# Patient Record
Sex: Female | Born: 1935 | Race: White | Hispanic: No | Marital: Married | Smoking: Never smoker
Health system: Southern US, Community
[De-identification: ages and names within clinical notes are randomized; demographics above are authoritative.]

## PROBLEM LIST (undated history)

## (undated) DIAGNOSIS — I509 Heart failure, unspecified: Secondary | ICD-10-CM

## (undated) DIAGNOSIS — I10 Essential (primary) hypertension: Secondary | ICD-10-CM

## (undated) DIAGNOSIS — E039 Hypothyroidism, unspecified: Secondary | ICD-10-CM

## (undated) DIAGNOSIS — I890 Lymphedema, not elsewhere classified: Secondary | ICD-10-CM

## (undated) DIAGNOSIS — C801 Malignant (primary) neoplasm, unspecified: Secondary | ICD-10-CM

## (undated) DIAGNOSIS — M109 Gout, unspecified: Secondary | ICD-10-CM

## (undated) DIAGNOSIS — N189 Chronic kidney disease, unspecified: Secondary | ICD-10-CM

## (undated) DIAGNOSIS — Z974 Presence of external hearing-aid: Secondary | ICD-10-CM

## (undated) DIAGNOSIS — I6529 Occlusion and stenosis of unspecified carotid artery: Secondary | ICD-10-CM

## (undated) DIAGNOSIS — E785 Hyperlipidemia, unspecified: Secondary | ICD-10-CM

## (undated) HISTORY — DX: Malignant (primary) neoplasm, unspecified: C80.1

## (undated) HISTORY — DX: Gout, unspecified: M10.9

## (undated) HISTORY — DX: Hyperlipidemia, unspecified: E78.5

## (undated) HISTORY — PX: HERNIA REPAIR: SHX51

## (undated) HISTORY — DX: Heart failure, unspecified: I50.9

## (undated) HISTORY — DX: Chronic kidney disease, unspecified: N18.9

## (undated) HISTORY — DX: Occlusion and stenosis of unspecified carotid artery: I65.29

## (undated) HISTORY — DX: Essential (primary) hypertension: I10

## (undated) HISTORY — DX: Lymphedema, not elsewhere classified: I89.0

## (undated) HISTORY — PX: ABDOMINAL HYSTERECTOMY: SHX81

---
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
## 2005-11-21 ENCOUNTER — Other Ambulatory Visit: Payer: Self-pay

## 2005-11-21 ENCOUNTER — Emergency Department: Payer: Self-pay | Admitting: General Practice

## 2006-05-20 ENCOUNTER — Inpatient Hospital Stay: Payer: Self-pay | Admitting: General Practice

## 2006-05-20 HISTORY — PX: TOTAL KNEE ARTHROPLASTY: SHX125

## 2006-06-23 ENCOUNTER — Other Ambulatory Visit: Payer: Self-pay

## 2006-06-23 ENCOUNTER — Inpatient Hospital Stay: Payer: Self-pay | Admitting: Internal Medicine

## 2006-07-03 ENCOUNTER — Other Ambulatory Visit: Payer: Self-pay

## 2006-07-03 ENCOUNTER — Emergency Department: Payer: Self-pay | Admitting: General Practice

## 2006-12-13 DIAGNOSIS — I10 Essential (primary) hypertension: Secondary | ICD-10-CM | POA: Insufficient documentation

## 2008-03-19 ENCOUNTER — Ambulatory Visit: Payer: Self-pay | Admitting: General Practice

## 2008-05-09 ENCOUNTER — Ambulatory Visit: Payer: Self-pay | Admitting: Pain Medicine

## 2008-06-13 ENCOUNTER — Ambulatory Visit: Payer: Self-pay | Admitting: Pain Medicine

## 2008-07-04 ENCOUNTER — Ambulatory Visit: Payer: Self-pay | Admitting: Pain Medicine

## 2008-10-11 ENCOUNTER — Ambulatory Visit: Payer: Self-pay | Admitting: Cardiology

## 2008-10-11 ENCOUNTER — Ambulatory Visit: Payer: Self-pay | Admitting: General Practice

## 2008-10-12 ENCOUNTER — Inpatient Hospital Stay: Payer: Self-pay | Admitting: General Practice

## 2008-10-12 HISTORY — PX: TOTAL HIP ARTHROPLASTY: SHX124

## 2010-02-10 ENCOUNTER — Ambulatory Visit: Payer: Self-pay | Admitting: Family Medicine

## 2011-01-15 DIAGNOSIS — E039 Hypothyroidism, unspecified: Secondary | ICD-10-CM | POA: Insufficient documentation

## 2011-09-03 ENCOUNTER — Ambulatory Visit: Payer: Self-pay | Admitting: Unknown Physician Specialty

## 2011-09-03 LAB — BASIC METABOLIC PANEL
BUN: 21 mg/dL — ABNORMAL HIGH (ref 7–18)
Calcium, Total: 9.4 mg/dL (ref 8.5–10.1)
Creatinine: 0.98 mg/dL (ref 0.60–1.30)
EGFR (Non-African Amer.): 58 — ABNORMAL LOW
Glucose: 95 mg/dL (ref 65–99)
Osmolality: 280 (ref 275–301)
Potassium: 3.8 mmol/L (ref 3.5–5.1)

## 2011-09-03 LAB — CBC
HCT: 41.3 % (ref 35.0–47.0)
HGB: 13.5 g/dL (ref 12.0–16.0)
MCH: 30 pg (ref 26.0–34.0)
Platelet: 256 10*3/uL (ref 150–440)
RBC: 4.5 10*6/uL (ref 3.80–5.20)

## 2011-09-03 LAB — URINALYSIS, COMPLETE
Bilirubin,UR: NEGATIVE
Blood: NEGATIVE
Ketone: NEGATIVE
Leukocyte Esterase: NEGATIVE
Nitrite: NEGATIVE
Ph: 5 (ref 4.5–8.0)
RBC,UR: 1 /HPF (ref 0–5)

## 2011-09-10 ENCOUNTER — Inpatient Hospital Stay: Payer: Self-pay | Admitting: Unknown Physician Specialty

## 2011-09-10 HISTORY — PX: REPLACEMENT TOTAL KNEE: SUR1224

## 2011-09-11 LAB — HEMOGLOBIN: HGB: 10.9 g/dL — ABNORMAL LOW (ref 12.0–16.0)

## 2011-09-11 LAB — BASIC METABOLIC PANEL
BUN: 22 mg/dL — ABNORMAL HIGH (ref 7–18)
Chloride: 106 mmol/L (ref 98–107)
Creatinine: 1.17 mg/dL (ref 0.60–1.30)
Potassium: 4.2 mmol/L (ref 3.5–5.1)
Sodium: 141 mmol/L (ref 136–145)

## 2011-09-11 LAB — PLATELET COUNT: Platelet: 208 10*3/uL (ref 150–440)

## 2011-09-12 LAB — HEMOGLOBIN: HGB: 10.8 g/dL — ABNORMAL LOW (ref 12.0–16.0)

## 2012-04-13 ENCOUNTER — Ambulatory Visit: Payer: Self-pay | Admitting: Orthopedic Surgery

## 2012-04-13 LAB — BASIC METABOLIC PANEL
Anion Gap: 7 (ref 7–16)
BUN: 24 mg/dL — ABNORMAL HIGH (ref 7–18)
Calcium, Total: 9.4 mg/dL (ref 8.5–10.1)
Chloride: 108 mmol/L — ABNORMAL HIGH (ref 98–107)
Creatinine: 0.98 mg/dL (ref 0.60–1.30)
EGFR (African American): >60
Osmolality: 289 (ref 275–301)
Potassium: 4.2 mmol/L (ref 3.5–5.1)

## 2012-04-13 LAB — CBC WITH DIFFERENTIAL/PLATELET
Eosinophil %: 3.4 %
HCT: 40.8 % (ref 35.0–47.0)
Lymphocyte %: 24.8 %
MCV: 92 fL (ref 80–100)
Monocyte #: 0.6 x10 3/mm (ref 0.2–0.9)
Monocyte %: 8.5 %
Neutrophil #: 4.5 10*3/uL (ref 1.4–6.5)
RBC: 4.42 10*6/uL (ref 3.80–5.20)
WBC: 7.2 10*3/uL (ref 3.6–11.0)

## 2012-04-21 ENCOUNTER — Ambulatory Visit: Payer: Self-pay | Admitting: Orthopedic Surgery

## 2012-04-21 HISTORY — PX: HARDWARE REMOVAL: SHX979

## 2013-01-22 HISTORY — PX: OTHER SURGICAL HISTORY: SHX169

## 2013-05-31 ENCOUNTER — Ambulatory Visit: Payer: Self-pay | Admitting: Family Medicine

## 2013-05-31 DIAGNOSIS — K760 Fatty (change of) liver, not elsewhere classified: Secondary | ICD-10-CM | POA: Insufficient documentation

## 2013-05-31 HISTORY — PX: OTHER SURGICAL HISTORY: SHX169

## 2013-06-15 ENCOUNTER — Ambulatory Visit: Payer: Self-pay | Admitting: Urgent Care

## 2013-08-05 ENCOUNTER — Emergency Department: Payer: Self-pay | Admitting: Emergency Medicine

## 2013-08-05 LAB — COMPREHENSIVE METABOLIC PANEL
Albumin: 3.5 g/dL (ref 3.4–5.0)
Alkaline Phosphatase: 82 U/L
BUN: 19 mg/dL — ABNORMAL HIGH (ref 7–18)
Bilirubin,Total: 0.4 mg/dL (ref 0.2–1.0)
Calcium, Total: 9.5 mg/dL (ref 8.5–10.1)
Chloride: 104 mmol/L (ref 98–107)
Co2: 26 mmol/L (ref 21–32)
Creatinine: 1.1 mg/dL (ref 0.60–1.30)
Glucose: 125 mg/dL — ABNORMAL HIGH (ref 65–99)
Osmolality: 278 (ref 275–301)
Potassium: 3.8 mmol/L (ref 3.5–5.1)
SGOT(AST): 10 U/L — ABNORMAL LOW (ref 15–37)
SGPT (ALT): 16 U/L (ref 12–78)
Sodium: 137 mmol/L (ref 136–145)

## 2013-08-05 LAB — URINALYSIS, COMPLETE
Bilirubin,UR: NEGATIVE
Blood: NEGATIVE
Nitrite: NEGATIVE
Ph: 5 (ref 4.5–8.0)
Protein: NEGATIVE
RBC,UR: 12 /HPF (ref 0–5)
Specific Gravity: 1.019 (ref 1.003–1.030)
Squamous Epithelial: 28

## 2013-08-05 LAB — CBC WITH DIFFERENTIAL/PLATELET
Basophil #: 0.1 10*3/uL (ref 0.0–0.1)
Basophil %: 1 %
Eosinophil #: 0.2 10*3/uL (ref 0.0–0.7)
Eosinophil %: 1.6 %
HCT: 43.2 % (ref 35.0–47.0)
HGB: 13.7 g/dL (ref 12.0–16.0)
Lymphocyte #: 1.8 10*3/uL (ref 1.0–3.6)
Lymphocyte %: 19.2 %
MCH: 28.7 pg (ref 26.0–34.0)
MCHC: 31.6 g/dL — ABNORMAL LOW (ref 32.0–36.0)
MCV: 91 fL (ref 80–100)
Monocyte #: 0.9 x10 3/mm (ref 0.2–0.9)
Monocyte %: 10 %
Neutrophil %: 68.2 %
RBC: 4.77 10*6/uL (ref 3.80–5.20)
RDW: 14.5 % (ref 11.5–14.5)
WBC: 9.5 10*3/uL (ref 3.6–11.0)

## 2013-08-05 LAB — LIPASE, BLOOD: Lipase: 119 U/L (ref 73–393)

## 2013-08-08 ENCOUNTER — Ambulatory Visit: Payer: Self-pay | Admitting: Gastroenterology

## 2013-08-14 LAB — PATHOLOGY REPORT

## 2014-03-26 LAB — CBC AND DIFFERENTIAL
HCT: 41 % (ref 36–46)
Hemoglobin: 13.6 g/dL (ref 12.0–16.0)
Platelets: 368 10*3/uL (ref 150–399)
WBC: 8.6 10*3/mL

## 2014-08-22 LAB — TSH: TSH: 5.39 u[IU]/mL (ref 0.41–5.90)

## 2014-08-22 LAB — HEPATIC FUNCTION PANEL
ALT: 14 U/L (ref 7–35)
AST: 20 U/L (ref 13–35)

## 2014-08-22 LAB — BASIC METABOLIC PANEL
BUN: 25 mg/dL — AB (ref 4–21)
CREATININE: 1 mg/dL (ref 0.5–1.1)
Glucose: 110 mg/dL
POTASSIUM: 4.3 mmol/L (ref 3.4–5.3)
Sodium: 141 mmol/L (ref 137–147)

## 2014-08-22 LAB — LIPID PANEL
CHOLESTEROL: 249 mg/dL — AB (ref 0–200)
HDL: 47 mg/dL (ref 35–70)
LDL Cholesterol: 155 mg/dL
Triglycerides: 235 mg/dL — AB (ref 40–160)

## 2014-08-22 LAB — HEMOGLOBIN A1C: Hgb A1c MFr Bld: 6 % (ref 4.0–6.0)

## 2014-10-04 DIAGNOSIS — J069 Acute upper respiratory infection, unspecified: Secondary | ICD-10-CM | POA: Diagnosis not present

## 2014-10-04 DIAGNOSIS — E039 Hypothyroidism, unspecified: Secondary | ICD-10-CM | POA: Diagnosis not present

## 2014-12-11 NOTE — Op Note (Signed)
PATIENT NAME:  Kayla Chan, Kayla Chan MR#:  [id number] DATE OF BIRTH:  1931-11-13  DATE OF PROCEDURE:  04/21/2012  PREOPERATIVE DIAGNOSES: Painful hardware right medial knee.   POSTOPERATIVE DIAGNOSIS:  Painful hardware right medial knee.  PROCEDURE: Removal of deep hardware right medial knee.   SURGEON: Laurene Footman, M.D.   ANESTHESIA: General.   DESCRIPTION OF PROCEDURE:  The patient was brought to the operating room and after adequate general anesthesia was obtained, a tourniquet was applied to the upper thigh and the leg prepped and draped in the usual sterile fashion. Appropriate patient identification and time-out procedures were carried out. A medial skin incision was made overlying the medial femoral condyle and subcutaneous tissues spread. The heads of both screws that needed to be removed could be palpated, but because of overlying soft tissue they could not be backed out without a slightly larger incision. Approximately a 5-cm incision was made and the subcutaneous tissue spread. The screws were palpable through the hamstrings. The soft tissue was split and the heads of the screws were identified and then backed out without much difficulty. C-arm was used to help getting alignment and finding the heads of the screws. The washers stayed behind underlying the tissue and were removed separately without difficulty. After this had been completed, the wound was thoroughly irrigated. The wound was closed with 2-0 Vicryl and 4-0 nylon in simple interrupted fashion. Dressing of Xeroform, 4 x 4's, and tape was applied. The patient was sent to the recovery room in stable condition. Hardware was discarded.   ESTIMATED BLOOD LOSS: 25 mL.   COMPLICATIONS: None.   ____________________________ Laurene Footman, MD mjm:bjt D: 04/21/2012 19:10:16 ET T: 04/22/2012 08:07:35 ET JOB#: [id number] Laurene Footman MD ELECTRONICALLY SIGNED 04/22/2012 8:39

## 2014-12-16 NOTE — Discharge Summary (Signed)
PATIENT NAME:  Kayla Chan, Kayla Chan MR#:  [id number] DATE OF BIRTH:  12-21-1931  DATE OF ADMISSION:  09/10/2011 DATE OF DISCHARGE:  09/14/2011  ADMITTING DIAGNOSIS: Right knee degenerative arthritis.   DISCHARGE DIAGNOSIS: Right knee degenerative arthritis.   OPERATION: On 09/10/2011 the patient underwent cemented right total knee replacement using computer-aided navigation.   SURGEON: Albesa Seen, M.D.   ASSISTANT: Vance Peper, PA-C   ANESTHESIA: Spinal.   ESTIMATED BLOOD LOSS: 250 mL with replacement of 2 liters of crystalloid.   DRAINS:  One Autovac.   COMPLICATIONS: Fracture of the medial femoral condyle treated with two screws.   IMPLANTS USED: Stryker Triathlon peg,  #3 posterior stabilized femoral component, #4 tibial tray, 11-mm PS insert, A29 patella, antibiotic-impregnated cement, 4.5 cortical and 4.5 cannulated screws.  The patient was stabilized, brought to the recovery room, and then brought down to the orthopedic floor.   HISTORY: The patient is a 81 year old female who presented for persistent right knee pain. The patient seems to have gotten worse with activity and with rest. The patient is having difficulty with activities of daily living. The patient does have a history of a pulmonary embolus. The patient failed conservative management with cortisone injections and activity modification with x-rays revealing tricompartmental arthritis.   PHYSICAL EXAMINATION:  GENERAL: Alert female with some difficulty with getting up and down with limping. CHEST: Clear. CARDIAC: Normal. MUSCULOSKELETAL: In regard to the right lower extremity, the patient has benign hip exam with knee exam showing tricompartmental tenderness with range of motion of 5 degrees from extension to 125 degrees flexion with good stability.   HOSPITAL COURSE: After initial admission on 09/10/2011 the patient was brought to the orthopedic floor. On postoperative day one the patient had a hemoglobin of 10.9 and  received Autovac transfusion with hemoglobin 10.8 the following day. The patient worked with physical therapy, initially bed to chair, and progressed up to ambulating over 140 feet including stairs on the day before discharge. The patient was sent home with Home Health physical therapy.   CONDITION AT DISCHARGE: Stable.   DISPOSITION: The patient was sent home with Home Health physical therapy.   DISCHARGE INSTRUCTIONS:  1. The patient is to follow up with [facility] orthopedics in about two weeks for staple removal.  2. The patient will do weight bear as tolerated using a walker initially. The patient will use thigh-high TED hose on both legs, removed at nighttime, and diet is regular.  3. The patient will use Polar Care to decrease swelling and to maintain a temperature of 40 degrees. The patient will try not to get her dressing wet or dirty and will call the clinic if any water gets under the bandage. The patient will call the clinic if there is any bright red bleeding, calf pain, bowel or bladder difficulty, or any fever greater than 101.5.   DISCHARGE MEDICATIONS:  Resume home medications. 1. Xarelto 10 mg once a day for 10 days. 2. Celebrex 200 mg twice a day. 3. Tylenol 650/1000 mg every six hours as needed for pain.  4. Ultram 1 to 2 tablets every six hours as needed for pain. 5. Oxycodone 5 mg 1 to 2 tablets every four hours as needed for severe pain.   ____________________________ Lenna Sciara. Reche Dixon, [place] [email or web address]:bjt D:  09/14/2011 07:06:01 ET         T: 09/15/2011 11:01:50 ET       JOB#: [id number]  cc: J. Reche Dixon, PA, <Dictator> J Macon Sandiford [facility] PA ELECTRONICALLY SIGNED  09/16/2011 7:23 

## 2014-12-16 NOTE — Op Note (Signed)
PATIENT NAME:  Kayla Chan, Kayla Chan MR#:  [id number] DATE OF BIRTH:  [birth year]  DATE OF PROCEDURE:  09/10/2011  PREOPERATIVE DIAGNOSIS: Degenerative arthritis, right knee.   POSTOPERATIVE DIAGNOSIS: Degenerative arthritis, right knee.   PROCEDURES:  1. Cemented right total knee replacement.  2. Computer-assisted navigation for right total knee replacement.   SURGEON: Alysia Penna. Mauri Pole, MD  ASSISTANT: Vance Peper, PA-C.   ANESTHESIA: Spinal.   ESTIMATED BLOOD LOSS: 250 mL.   REPLACEMENT: 2 liters of crystalloid.   DRAINS: One Autovac.   COMPLICATIONS: Fracture of medial femoral condyle treated with two screws.   IMPLANTS USED: Stryker Triathlon pegged #3 posterior stabilized femoral component, #4 tibial tray, 11 mm PS insert, A29 patella, antibiotic-impregnated cement, 4.5 cortical and 4.5 cannulated screws.   BRIEF CLINICAL NOTE AND PATHOLOGY: The patient had persistent knee pain with documented degenerative arthritis. Options, risks, and benefits were discussed in detail. Patient elected to proceed with operative intervention. She had failed physical therapy as she had done on her other knee, also had had injections and tried anti-inflammatory medication. She had pain at rest, worse with activity. At the time of the procedure there was severe tricompartmental arthritis. There was excellent range of motion at the end of the procedure.   The fracture through the medial femoral condyle occurred while the trial femoral component was being impacted. This was repaired anatomically with two screws.   DESCRIPTION OF PROCEDURE: Preop antibiotics, adequate anesthesia, routine prepping and draping. Appropriate timeout was called. The leg was elevated, Esmarch bandage was used and the tourniquet was inflated to 250 mmHg. Total tourniquet time was 45 minutes.   Routine midline incision was made. Medial parapatellar incision was made, patella was slid to the lateral aspect. The knee was debrided of soft  tissue. There was significant synovitis in the suprapatellar pouch.   Trackers were then placed and mapping of the knee was performed.   Distal femur was then resected using navigation. Chamfer cuts were made followed by preparation of intercondylar notch area.   Posterior aspect of the distal femur was stripped of soft tissue.   Proximal tibia was then resected using navigation checking with external alignment rod. The knee taken through a range of motion and the ligaments were balanced.   The femoral trial was then placed at which point fracture of the medial femoral condyle from the intercondylar notch area. This was reduced with a clamp and one 4.5 mm cortical screw was placed from medial to lateral followed by a 4.5 cancellus screw. Compression was excellent.   The patella thickness was measured, 10 mm were removed leaving 12 mm and drill holes were made.   The proximal tibia was then prepared in routine fashion using navigation, again checking with external alignment rod.   The knee was then thoroughly irrigated with pulsating lavage. The area where the fracture extended into the intercondylar notch area was covered with a thin layer of cement. This was allowed to harden. The remainder of the cement was then mixed and the components were inserted in routine fashion beginning with the tibial component followed by the femoral component. They were impacted, alignment was checked, trial was placed. The knee was maintained in extension while excess cement was removed and cement was allowed to harden. The patella component was placed and maintained with a clamp with excess cement being removed.   Different spacers were tried, it was felt that the 11 mm gave the best range of motion with stability. The actual 11 mm was  then placed after posterior capsule injected Marcaine with epinephrine and morphine.   Knee taken through range of motion, it was measured to be from 0 to 135 degrees. The knee was  stable.   The tourniquet was released. Hemostasis was good. The Autovac drain was placed. The fascia closed with #2 quill the knee in slight flexion. Subcutaneous closed with 0 quill the knee in slight flexion followed by placement of staples with knee in more flexion. Soft sterile dressing was applied. Sponge and needle counts reported as correct prior to and after wound closure. The patient was awakened, taken to the PAC-U having tolerated the procedure well.    ____________________________ Alysia Penna. Mauri Pole, MD jcc:cms D: 09/11/2011 15:01:00 ET T: 09/11/2011 16:06:23 ET JOB#: [id number]  cc: Alysia Penna. Mauri Pole, MD, <Dictator> Alysia Penna Gryffin Altice MD ELECTRONICALLY SIGNED 09/18/2011 12:27

## 2014-12-18 ENCOUNTER — Ambulatory Visit
Admit: 2014-12-18 | Disposition: A | Discharge status: Home / Self Care | Payer: Self-pay | Attending: Family Medicine | Admitting: Family Medicine

## 2014-12-18 DIAGNOSIS — Z1231 Encounter for screening mammogram for malignant neoplasm of breast: Secondary | ICD-10-CM | POA: Diagnosis not present

## 2015-01-25 ENCOUNTER — Other Ambulatory Visit: Payer: Self-pay | Admitting: *Deleted

## 2015-01-25 NOTE — Telephone Encounter (Signed)
Refill request for desonide 0.05% Cream Last filled by MD on- 09/05/2014  Last Appt: 10/04/2014 Next Appt: none Please advise refill? Desonide cream was in pt's fill history, but not in med list.

## 2015-01-26 MED ORDER — DESONIDE 0.05 % EX CREA: 1.0000 "application " | TOPICAL_CREAM | Freq: Every day | CUTANEOUS | Status: DC

## 2015-02-18 DIAGNOSIS — Z85828 Personal history of other malignant neoplasm of skin: Secondary | ICD-10-CM | POA: Insufficient documentation

## 2015-04-01 ENCOUNTER — Other Ambulatory Visit: Payer: Self-pay | Admitting: Family Medicine

## 2015-05-17 ENCOUNTER — Other Ambulatory Visit: Payer: Self-pay | Admitting: Family Medicine

## 2015-05-31 ENCOUNTER — Ambulatory Visit (INDEPENDENT_AMBULATORY_CARE_PROVIDER_SITE_OTHER): Payer: Medicare Other | Admitting: Family Medicine

## 2015-05-31 DIAGNOSIS — Z23 Encounter for immunization: Secondary | ICD-10-CM

## 2015-06-28 DIAGNOSIS — L304 Erythema intertrigo: Secondary | ICD-10-CM | POA: Insufficient documentation

## 2015-06-28 DIAGNOSIS — M549 Dorsalgia, unspecified: Secondary | ICD-10-CM | POA: Insufficient documentation

## 2015-06-28 DIAGNOSIS — I491 Atrial premature depolarization: Secondary | ICD-10-CM | POA: Insufficient documentation

## 2015-06-28 DIAGNOSIS — R609 Edema, unspecified: Secondary | ICD-10-CM | POA: Insufficient documentation

## 2015-06-28 DIAGNOSIS — R7303 Prediabetes: Secondary | ICD-10-CM | POA: Insufficient documentation

## 2015-06-28 DIAGNOSIS — L309 Dermatitis, unspecified: Secondary | ICD-10-CM | POA: Insufficient documentation

## 2015-06-28 DIAGNOSIS — K5900 Constipation, unspecified: Secondary | ICD-10-CM | POA: Insufficient documentation

## 2015-06-28 DIAGNOSIS — I4940 Unspecified premature depolarization: Secondary | ICD-10-CM | POA: Insufficient documentation

## 2015-07-01 ENCOUNTER — Ambulatory Visit (INDEPENDENT_AMBULATORY_CARE_PROVIDER_SITE_OTHER): Payer: Medicare Other | Admitting: Family Medicine

## 2015-07-01 ENCOUNTER — Encounter: Payer: Self-pay | Admitting: Family Medicine

## 2015-07-01 VITALS — BP 142/72 | HR 71 | Temp 97.6°F | Resp 16 | Wt 243.0 lb

## 2015-07-01 DIAGNOSIS — E039 Hypothyroidism, unspecified: Secondary | ICD-10-CM

## 2015-07-01 DIAGNOSIS — R7303 Prediabetes: Secondary | ICD-10-CM | POA: Diagnosis not present

## 2015-07-01 DIAGNOSIS — E785 Hyperlipidemia, unspecified: Secondary | ICD-10-CM

## 2015-07-01 DIAGNOSIS — K760 Fatty (change of) liver, not elsewhere classified: Secondary | ICD-10-CM

## 2015-07-01 DIAGNOSIS — I4940 Unspecified premature depolarization: Secondary | ICD-10-CM

## 2015-07-01 DIAGNOSIS — M1990 Unspecified osteoarthritis, unspecified site: Secondary | ICD-10-CM | POA: Diagnosis not present

## 2015-07-01 DIAGNOSIS — R300 Dysuria: Secondary | ICD-10-CM

## 2015-07-01 LAB — POCT URINALYSIS DIPSTICK
Bilirubin, UA: NEGATIVE
Blood, UA: NEGATIVE
GLUCOSE UA: NEGATIVE
Ketones, UA: NEGATIVE
Leukocytes, UA: NEGATIVE
NITRITE UA: NEGATIVE
Protein, UA: NEGATIVE
Spec Grav, UA: >=1.03
UROBILINOGEN UA: 0.2
pH, UA: 6

## 2015-07-01 MED ORDER — MOMETASONE FUROATE 0.1 % EX OINT: 1.0000 "application " | TOPICAL_OINTMENT | Freq: Every day | CUTANEOUS | Status: DC | PRN

## 2015-07-01 NOTE — Progress Notes (Signed)
Patient: Kayla Chan Female    DOB: 1931/09/15   81 y.o.   MRN: [id number] Visit Date: 07/01/2015  Today's Provider: Lelon Huh, MD   Chief Complaint  Patient presents with  . Hypertension    follow up  . Hypothyroidism    follow up  . Hyperglycemia    follow up  . Hyperlipidemia    follow up  . Dysuria   Subjective:    HPI  Hypertension, follow-up:  BP Readings from Last 3 Encounters:  07/01/15 142/72  10/04/14 120/84    She was last seen for hypertension 11 months ago.  BP at that visit was 144/82. Management since that visit includes no changes. She reports good compliance with treatment. She is having side effects. Has double vision at night after taking Verapamil. She is not exercising. She is adherent to low salt diet.   Outside blood pressures are 130/67. She is experiencing occasional palpitations Patient denies chest pain, chest pressure/discomfort, claudication, dyspnea, exertional chest pressure/discomfort, fatigue, irregular heart beat, lower extremity edema, near-syncope, orthopnea, paroxysmal nocturnal dyspnea, syncope and tachypnea.   Cardiovascular risk factors include advanced age (older than 61 for men, 87 for women), hypertension, obesity (BMI >= 30 kg/m2) and sedentary lifestyle.  Use of agents associated with hypertension: NSAIDS.     Weight trend: stable Wt Readings from Last 3 Encounters:  07/01/15 243 lb (110.224 kg)  10/04/14 241 lb (109.317 kg)    Current diet: in general, a "healthy" diet    ------------------------------------------------------------------------   Hyperglycemia, Follow-up:   Lab Results  Component Value Date   HGBA1C 6.0 08/22/2014   GLUCOSE 125* 08/05/2013   GLUCOSE 100* 04/13/2012   GLUCOSE 130* 09/11/2011    Last seen for for this 11 months ago.  Management since then includes no changes. Current symptoms include visual disturbances and have been stable.  Weight trend: stable Prior visit  with dietician: no Current diet: in general, a "healthy" diet   Current exercise: none  Pertinent Labs:    Component Value Date/Time   CHOL 249* 08/22/2014   TRIG 235* 08/22/2014   CREATININE 1.0 08/22/2014   CREATININE 1.10 08/05/2013 1238    Wt Readings from Last 3 Encounters:  07/01/15 243 lb (110.224 kg)  10/04/14 241 lb (109.317 kg)    Lipid/Cholesterol, Follow-up:   Last seen for this11 months ago.  Management changes since that visit include no changes. . Last Lipid Panel:    Component Value Date/Time   CHOL 249* 08/22/2014   TRIG 235* 08/22/2014   HDL 47 08/22/2014   LDLCALC 155 08/22/2014    Risk factors for vascular disease include hypertension  She reports good compliance with treatment. She is not having side effects.  Current symptoms include visual disturbances and have been stable. Weight trend: stable Prior visit with dietician: no Current diet: in general, a "healthy" diet   Current exercise: none  Wt Readings from Last 3 Encounters:  07/01/15 243 lb (110.224 kg)  10/04/14 241 lb (109.317 kg)    ------------------------------------------------------------------- Hypothyroidism follow up:  Last office visit was 08/21/2014. Changes made at that visit includes increasing Levothyroxine  To 172mcg daily. Patient reports good compliance with treatment, and good tolerance.  Lab Results  Component Value Date   TSH 5.39 08/22/2014       Dysuria: Patient comes in requesting her urine be checked for infection. Patient reports last week she has slight burning after urinating and low back pain, but has now resolved.  Allergies  Allergen Reactions  . Atorvastatin     Other reaction(s): Muscle Pain  . Colestid  [Colestipol Hcl]     Itching, insomnia  . Crestor  [Rosuvastatin Calcium]     Other reaction(s): Muscle Pain  . Doxycycline   . Fluvastatin Sodium     Other reaction(s): Muscle Pain  . Furosemide     Severe Cramping  . Penicillins     . Pravastatin Sodium     Numbness in legs   Previous Medications   ASPIRIN 81 MG TABLET    Take 81 mg by mouth daily.   BENAZEPRIL-HYDROCHLORTHIAZIDE (LOTENSIN HCT) 10-12.5 MG TABLET    Take 0.5 tablets by mouth daily.   COENZYME Q-10 100 MG CAPSULE    Take 1 capsule by mouth daily.   DESONIDE (DESOWEN) 0.05 % CREAM    Apply 1 application topically daily. Apply To Facial Rash As Needed   EZETIMIBE (ZETIA) 10 MG TABLET    Take 1 tablet by mouth every morning.   LEVOTHYROXINE (SYNTHROID, LEVOTHROID) 75 MCG TABLET    TAKE 1 TABLET BY MOUTH EVERY DAY   MULTIPLE VITAMINS-MINERALS (MULTIVITAMIN ADULT PO)    Take 1 tablet by mouth daily.   SENNA (SENOKOT) 8.6 MG TABLET    Take 1 tablet by mouth daily.   VERAPAMIL (VERELAN PM) 120 MG 24 HR CAPSULE    TAKE 1 CAPSULE EVERY EVENING    Review of Systems  Constitutional: Negative for fever, chills, appetite change and fatigue.  HENT: Positive for tinnitus.   Respiratory: Negative for chest tightness and shortness of breath.   Cardiovascular: Positive for palpitations. Negative for chest pain.  Gastrointestinal: Positive for nausea. Negative for vomiting and abdominal pain.  Genitourinary: Positive for dysuria (burning after urination).  Musculoskeletal: Positive for back pain.  Neurological: Negative for dizziness and weakness.    Social History  Substance Use Topics  . Smoking status: Never Smoker   . Smokeless tobacco: Not on file  . Alcohol Use: No   Objective:   BP 142/72 mmHg  Pulse 71  Temp(Src) 97.6 F (36.4 C) (Oral)  Resp 16  Wt 243 lb (110.224 kg)  SpO2 97%  Physical Exam  General Appearance:    Alert, cooperative, no distress, obese  Eyes:    PERRL, conjunctiva/corneas clear, EOM's intact       Lungs:     Clear to auscultation bilaterally, respirations unlabored  Heart:    Regular rate and rhythm  Neurologic:   Awake, alert, oriented x 3. No apparent focal neurological           defect.         Assessment & Plan:      1. Dysuria Resolved. Normal u/a - POCT Urinalysis Dipstick  2. Osteoarthritis, unspecified osteoarthritis type, unspecified site Stable on current medications.   3. Fatty infiltration of liver  - Comprehensive metabolic panel  4. HLD (hyperlipidemia) Tolerating Zetia.  - Comprehensive metabolic panel - Lipid panel  5. Hypothyroidism, unspecified hypothyroidism type  - T4 AND TSH  6. Pre-diabetes  - Hemoglobin A1c  7. Premature heartbeats Well controlled.        Lelon Huh, MD  [facility]

## 2015-07-02 DIAGNOSIS — R7303 Prediabetes: Secondary | ICD-10-CM | POA: Diagnosis not present

## 2015-07-02 DIAGNOSIS — E785 Hyperlipidemia, unspecified: Secondary | ICD-10-CM | POA: Diagnosis not present

## 2015-07-02 DIAGNOSIS — K760 Fatty (change of) liver, not elsewhere classified: Secondary | ICD-10-CM | POA: Diagnosis not present

## 2015-07-02 DIAGNOSIS — E039 Hypothyroidism, unspecified: Secondary | ICD-10-CM | POA: Diagnosis not present

## 2015-07-03 LAB — HEMOGLOBIN A1C
ESTIMATED AVERAGE GLUCOSE: 131 mg/dL
Hgb A1c MFr Bld: 6.2 % — ABNORMAL HIGH (ref 4.8–5.6)

## 2015-07-03 LAB — LIPID PANEL
CHOLESTEROL TOTAL: 235 mg/dL — AB (ref 100–199)
Chol/HDL Ratio: 5.2 ratio units — ABNORMAL HIGH (ref 0.0–4.4)
HDL: 45 mg/dL (ref >39)
LDL Calculated: 136 mg/dL — ABNORMAL HIGH (ref 0–99)
TRIGLYCERIDES: 271 mg/dL — AB (ref 0–149)
VLDL CHOLESTEROL CAL: 54 mg/dL — AB (ref 5–40)

## 2015-07-03 LAB — COMPREHENSIVE METABOLIC PANEL
ALBUMIN: 3.8 g/dL (ref 3.5–4.7)
ALK PHOS: 68 IU/L (ref 39–117)
ALT: 10 IU/L (ref 0–32)
AST: 18 IU/L (ref 0–40)
Albumin/Globulin Ratio: 1.5 (ref 1.1–2.5)
BILIRUBIN TOTAL: 0.3 mg/dL (ref 0.0–1.2)
BUN / CREAT RATIO: 23 (ref 11–26)
BUN: 22 mg/dL (ref 8–27)
CHLORIDE: 102 mmol/L (ref 97–106)
CO2: 20 mmol/L (ref 18–29)
Calcium: 9.3 mg/dL (ref 8.7–10.3)
Creatinine, Ser: 0.95 mg/dL (ref 0.57–1.00)
GFR calc Af Amer: 65 mL/min/{1.73_m2} (ref >59)
GFR calc non Af Amer: 56 mL/min/{1.73_m2} — ABNORMAL LOW (ref >59)
GLOBULIN, TOTAL: 2.6 g/dL (ref 1.5–4.5)
Glucose: 105 mg/dL — ABNORMAL HIGH (ref 65–99)
Potassium: 4.3 mmol/L (ref 3.5–5.2)
SODIUM: 141 mmol/L (ref 136–144)
Total Protein: 6.4 g/dL (ref 6.0–8.5)

## 2015-07-03 LAB — T4 AND TSH
T4 TOTAL: 11.2 ug/dL (ref 4.5–12.0)
TSH: 2.53 u[IU]/mL (ref 0.450–4.500)

## 2015-07-15 ENCOUNTER — Telehealth: Payer: Self-pay | Admitting: Family Medicine

## 2015-07-15 MED ORDER — TRAMADOL HCL 50 MG PO TABS: ORAL_TABLET | ORAL | Status: DC

## 2015-07-15 NOTE — Telephone Encounter (Signed)
Rx called into pharmacy. Patient notified.

## 2015-07-15 NOTE — Telephone Encounter (Signed)
Pt called called saying she has been having left foot pain. She went to see Dr. Alexandria Lodge at [facility] ortho and he took an xray saying that she has a spur .  He did not give her any medication.  She said he told her that her primary doctor would do that.  She said she needs something for pain.  [facility] [address].  Call back is [phone]  Thanks Con Memos

## 2015-07-15 NOTE — Telephone Encounter (Signed)
Please call in tramadol 50mg  one-two  tablets every eight hours as need for foot pain, #60, rf x 1

## 2015-07-17 ENCOUNTER — Encounter: Payer: Self-pay | Admitting: Family Medicine

## 2015-07-17 ENCOUNTER — Ambulatory Visit (INDEPENDENT_AMBULATORY_CARE_PROVIDER_SITE_OTHER): Payer: Medicare Other | Admitting: Family Medicine

## 2015-07-17 VITALS — BP 120/68 | HR 68 | Temp 98.5°F | Resp 16 | Wt 240.0 lb

## 2015-07-17 DIAGNOSIS — M1000 Idiopathic gout, unspecified site: Secondary | ICD-10-CM

## 2015-07-17 DIAGNOSIS — J069 Acute upper respiratory infection, unspecified: Secondary | ICD-10-CM | POA: Diagnosis not present

## 2015-07-17 MED ORDER — DICLOFENAC POTASSIUM 50 MG PO TABS: 50.0000 mg | ORAL_TABLET | Freq: Two times a day (BID) | ORAL | Status: DC | PRN

## 2015-07-17 NOTE — Progress Notes (Signed)
Patient: Kayla Chan Female    DOB: 06/11/1932   81 y.o.   MRN: [id number] Visit Date: 07/17/2015  Today's Provider: Lelon Huh, MD   Chief Complaint  Patient presents with  . Foot Swelling  . Leg Swelling   Subjective:    Toe Pain  The incident occurred 3 to 5 days ago (Saturday 07/13/2015). There was no injury mechanism. The pain is present in the left foot, left toes, left heel, left ankle and left leg. The pain is moderate. The pain has been constant since onset. Associated symptoms include an inability to bear weight and a loss of motion. Pertinent negatives include no numbness or tingling. The symptoms are aggravated by movement, weight bearing and palpation. She has tried nothing for the symptoms.   It started in her left great toe which is still painful, then spread to top of foot and ankle.    Allergies  Allergen Reactions  . Atorvastatin     Other reaction(s): Muscle Pain  . Colestid  [Colestipol Hcl]     Itching, insomnia  . Crestor  [Rosuvastatin Calcium]     Other reaction(s): Muscle Pain  . Doxycycline   . Fluvastatin Sodium     Other reaction(s): Muscle Pain  . Furosemide     Severe Cramping  . Penicillins   . Pravastatin Sodium     Numbness in legs   Previous Medications   ASPIRIN 81 MG TABLET    Take 81 mg by mouth daily.   BENAZEPRIL-HYDROCHLORTHIAZIDE (LOTENSIN HCT) 10-12.5 MG TABLET    Take 0.5 tablets by mouth daily.   COENZYME Q-10 100 MG CAPSULE    Take 1 capsule by mouth daily.   DESONIDE (DESOWEN) 0.05 % CREAM    Apply 1 application topically daily. Apply To Facial Rash As Needed   EZETIMIBE (ZETIA) 10 MG TABLET    Take 1 tablet by mouth every morning.   LEVOTHYROXINE (SYNTHROID, LEVOTHROID) 75 MCG TABLET    TAKE 1 TABLET BY MOUTH EVERY DAY   MOMETASONE (ELOCON) 0.1 % OINTMENT    Apply 1 application topically daily as needed. Apply to affected area   MULTIPLE VITAMINS-MINERALS (MULTIVITAMIN ADULT PO)    Take 1 tablet by mouth daily.     SENNA (SENOKOT) 8.6 MG TABLET    Take 1 tablet by mouth daily.   TRAMADOL (ULTRAM) 50 MG TABLET    Take 1-2 tablets every eight hours as need for foot pain   VERAPAMIL (VERELAN PM) 120 MG 24 HR CAPSULE    TAKE 1 CAPSULE EVERY EVENING    Review of Systems  Respiratory: Positive for cough. Negative for chest tightness and wheezing.   Cardiovascular: Positive for leg swelling. Negative for chest pain and palpitations.  Musculoskeletal: Positive for back pain.  Neurological: Positive for dizziness. Negative for tingling, light-headedness, numbness and headaches.    Social History  Substance Use Topics  . Smoking status: Never Smoker   . Smokeless tobacco: Not on file  . Alcohol Use: No   Objective:   BP 120/68 mmHg  Pulse 68  Temp(Src) 98.5 F (36.9 C) (Oral)  Resp 16  Wt 240 lb (108.863 kg)  SpO2 97%  Physical Exam General appearance: alert, well developed, well nourished, cooperative and in no distress Head: Normocephalic, without obvious abnormality, atraumatic. NP congested with pale boggy turbinates.  Lungs: Respirations even and unlabored. CTAB Extremities: No gross deformities Skin: Skin color, texture, turgor normal. No rashes seen  Psych: Appropriate mood  and affect. Neurologic: Mental status: Alert, oriented to person, place, and time, thought content appropriate. Left lateral first MTP slightly swollen, red and tender. Mild generalized swelling and tenderness without erythema across dorsum of foot and ankle.     Assessment & Plan:     1. Acute idiopathic gout, unspecified site (suspected) Diclofenac 50mg  BID prn - Uric acid Call if symptoms change or if not rapidly improving.    2. Upper respiratory infection Counseled regarding signs and symptoms of viral and bacterial respiratory infections. Advised to call or return for additional evaluation if he develops any sign of bacterial infection, or if current symptoms last longer than 10 days.         Lelon Huh, MD  [facility]

## 2015-07-18 LAB — URIC ACID: URIC ACID: 8.4 mg/dL — AB (ref 2.5–7.1)

## 2015-07-19 ENCOUNTER — Telehealth: Payer: Self-pay | Admitting: *Deleted

## 2015-07-19 MED ORDER — COLCHICINE 0.6 MG PO TABS: 0.6000 mg | ORAL_TABLET | Freq: Two times a day (BID) | ORAL | Status: DC | PRN

## 2015-07-19 MED ORDER — LISINOPRIL 10 MG PO TABS: 10.0000 mg | ORAL_TABLET | Freq: Every day | ORAL | Status: DC

## 2015-07-19 NOTE — Telephone Encounter (Signed)
Patient was notified.

## 2015-07-19 NOTE — Telephone Encounter (Signed)
Patients husband Ray stated since patient starting taking diclofenac, she has been dizzy and having a stomach ache. Ray wanted to know if pt could take colchicine instead for the gout symptoms?

## 2015-07-19 NOTE — Telephone Encounter (Signed)
Patient's husband was notified of results. Expressed understanding. Rx was sent to pharmacy.

## 2015-07-19 NOTE — Telephone Encounter (Signed)
Sent in new rx.  Stop if any diarrhea. Thanks.

## 2015-07-19 NOTE — Telephone Encounter (Signed)
-----   Message from Birdie Sons, MD sent at 07/18/2015  9:15 AM EST ----- Uric acid level is very high consistent with gout. Continue diclofenac until symptoms resolved. Recommend she change lisinopril-hctz to lisinopril 10mg  daily, #30, rf x 1, because hctz can increase uric acid levels and make her more prone to gout. Follow up 1 mont for BP check.

## 2015-08-20 ENCOUNTER — Telehealth: Payer: Self-pay | Admitting: Family Medicine

## 2015-08-20 DIAGNOSIS — I10 Essential (primary) hypertension: Secondary | ICD-10-CM | POA: Diagnosis not present

## 2015-08-20 DIAGNOSIS — E790 Hyperuricemia without signs of inflammatory arthritis and tophaceous disease: Secondary | ICD-10-CM

## 2015-08-21 LAB — RENAL FUNCTION PANEL
ALBUMIN: 4.1 g/dL (ref 3.5–4.7)
BUN/Creatinine Ratio: 21 (ref 11–26)
BUN: 22 mg/dL (ref 8–27)
CO2: 22 mmol/L (ref 18–29)
Calcium: 9.8 mg/dL (ref 8.7–10.3)
Chloride: 103 mmol/L (ref 96–106)
Creatinine, Ser: 1.06 mg/dL — ABNORMAL HIGH (ref 0.57–1.00)
GFR, EST AFRICAN AMERICAN: 57 mL/min/{1.73_m2} — AB (ref >59)
GFR, EST NON AFRICAN AMERICAN: 49 mL/min/{1.73_m2} — AB (ref >59)
Glucose: 78 mg/dL (ref 65–99)
Phosphorus: 3.6 mg/dL (ref 2.5–4.5)
Potassium: 4.7 mmol/L (ref 3.5–5.2)
Sodium: 142 mmol/L (ref 134–144)

## 2015-08-21 LAB — URIC ACID: Uric Acid: 8 mg/dL — ABNORMAL HIGH (ref 2.5–7.1)

## 2015-08-22 NOTE — Telephone Encounter (Signed)
error 

## 2015-08-23 ENCOUNTER — Other Ambulatory Visit: Payer: Self-pay | Admitting: Family Medicine

## 2015-09-05 ENCOUNTER — Telehealth: Payer: Self-pay | Admitting: Family Medicine

## 2015-09-05 NOTE — Telephone Encounter (Signed)
Patient stated that she takes lisinopril and Zetia around 10 am every morning. About 15 mins after she takes these meds she feels drunk and dizzy. These symptoms last up until late afternoon. Please advise?

## 2015-09-05 NOTE — Telephone Encounter (Signed)
Per Dr. Caryn Section advised pt to that 1/2 tablet of lisinopril qd and f/u 09/23/15.

## 2015-09-05 NOTE — Telephone Encounter (Signed)
Pt states when she takes the lisinopril (PRINIVIL,ZESTRIL) 10 MG tablet she feels "funny in her head"  A little shakey.  Please advise  Her call back is [phone]  Thanks Con Memos

## 2015-09-11 DIAGNOSIS — H2513 Age-related nuclear cataract, bilateral: Secondary | ICD-10-CM | POA: Diagnosis not present

## 2015-09-16 ENCOUNTER — Other Ambulatory Visit: Payer: Self-pay | Admitting: Family Medicine

## 2015-09-23 ENCOUNTER — Ambulatory Visit: Payer: Self-pay | Admitting: Family Medicine

## 2015-09-24 ENCOUNTER — Ambulatory Visit (INDEPENDENT_AMBULATORY_CARE_PROVIDER_SITE_OTHER): Payer: PPO | Admitting: Family Medicine

## 2015-09-24 ENCOUNTER — Encounter: Payer: Self-pay | Admitting: Family Medicine

## 2015-09-24 VITALS — BP 140/70 | HR 76 | Temp 97.5°F | Resp 18 | Wt 244.0 lb

## 2015-09-24 DIAGNOSIS — E790 Hyperuricemia without signs of inflammatory arthritis and tophaceous disease: Secondary | ICD-10-CM

## 2015-09-24 DIAGNOSIS — M1A2790 Drug-induced chronic gout, unspecified ankle and foot, without tophus (tophi): Secondary | ICD-10-CM

## 2015-09-24 DIAGNOSIS — I10 Essential (primary) hypertension: Secondary | ICD-10-CM | POA: Diagnosis not present

## 2015-09-24 DIAGNOSIS — R300 Dysuria: Secondary | ICD-10-CM

## 2015-09-24 DIAGNOSIS — M109 Gout, unspecified: Secondary | ICD-10-CM | POA: Insufficient documentation

## 2015-09-24 DIAGNOSIS — M10279 Drug-induced gout, unspecified ankle and foot: Secondary | ICD-10-CM

## 2015-09-24 LAB — POCT URINALYSIS DIPSTICK
Bilirubin, UA: NEGATIVE
Blood, UA: NEGATIVE
GLUCOSE UA: NEGATIVE
Ketones, UA: NEGATIVE
LEUKOCYTES UA: NEGATIVE
Nitrite, UA: NEGATIVE
Protein, UA: NEGATIVE
SPEC GRAV UA: 1.025
UROBILINOGEN UA: 0.2
pH, UA: 6

## 2015-09-24 NOTE — Progress Notes (Signed)
Patient: Kayla Chan Female    DOB: 1932-08-09   82 y.o.   MRN: [id number] Visit Date: 09/24/2015  Today's Provider: Lelon Huh, MD   Chief Complaint  Patient presents with  . Hypertension    follow up   Subjective:    HPI   Hypertension, follow-up:  BP Readings from Last 3 Encounters:  09/24/15 166/78  07/17/15 120/68  07/01/15 142/72    She was last seen for hypertension 2 months ago.  BP at that visit was 120/68. Management at that visit includes changing benzepril/HCTZ to Lisinopril 10 mg due to elevated uric acid and gout.. Changes made since the last visit include decreasing Lisinopril to 1/2 tablet daily . This change was made on 09/05/2015 due to patient feeling lightheaded shortly after taking Lisinopril. Patient states she did as instructed taking 1/2 a tablet of Lisinopril and noticed her blood pressure started to go back up so she started taking 1 whole pill again. She reports good compliance with treatment. She is having side effects. Patient reports she gets double vision after taking Verapamil. She is not exercising. She is adherent to low salt diet.   Outside blood pressures are 127/69 (home wrist monitor). She is experiencing lower extremity edema and palpitations.  Patient denies chest pain, chest pressure/discomfort, claudication, dyspnea, exertional chest pressure/discomfort, fatigue, irregular heart beat, near-syncope, orthopnea, paroxysmal nocturnal dyspnea and syncope Cardiovascular risk factors include hypertension and sedentary lifestyle.  Use of agents associated with hypertension: NSAIDS.     Weight trend: increasing steadily Wt Readings from Last 3 Encounters:  09/24/15 244 lb (110.678 kg)  07/17/15 240 lb (108.863 kg)  07/01/15 243 lb (110.224 kg)    Current diet: in general, a "healthy" diet    ------------------------------------------------------------------------      Allergies  Allergen Reactions  . Atorvastatin    Other reaction(s): Muscle Pain  . Colestid  [Colestipol Hcl]     Itching, insomnia  . Crestor  [Rosuvastatin Calcium]     Other reaction(s): Muscle Pain  . Doxycycline   . Fluvastatin Sodium     Other reaction(s): Muscle Pain  . Furosemide     Severe Cramping  . Penicillins   . Pravastatin Sodium     Numbness in legs   Previous Medications   ASPIRIN 81 MG TABLET    Take 81 mg by mouth daily.   BENAZEPRIL-HYDROCHLORTHIAZIDE (LOTENSIN HCT) 10-12.5 MG TABLET    Take 0.5 tablets by mouth daily.   COENZYME Q-10 100 MG CAPSULE    Take 1 capsule by mouth daily.   COLCHICINE 0.6 MG TABLET    Take 1 tablet (0.6 mg total) by mouth 2 (two) times daily as needed.   DESONIDE (DESOWEN) 0.05 % CREAM    Apply 1 application topically daily. Apply To Facial Rash As Needed   DICLOFENAC (CATAFLAM) 50 MG TABLET    TAKE 1 TABLET (50 MG TOTAL) BY MOUTH 2 (TWO) TIMES DAILY AS NEEDED (FOOT PAIN).   EZETIMIBE (ZETIA) 10 MG TABLET    Take 1 tablet by mouth every morning.   LEVOTHYROXINE (SYNTHROID, LEVOTHROID) 75 MCG TABLET    TAKE 1 TABLET BY MOUTH EVERY DAY   LISINOPRIL (PRINIVIL,ZESTRIL) 10 MG TABLET    TAKE 1 TABLET (10 MG TOTAL) BY MOUTH DAILY.   MOMETASONE (ELOCON) 0.1 % OINTMENT    Apply 1 application topically daily as needed. Apply to affected area   MULTIPLE VITAMINS-MINERALS (MULTIVITAMIN ADULT PO)    Take 1 tablet by  mouth daily.   SENNA (SENOKOT) 8.6 MG TABLET    Take 1 tablet by mouth daily.   TRAMADOL (ULTRAM) 50 MG TABLET    Take 1-2 tablets every eight hours as need for foot pain   VERAPAMIL (VERELAN PM) 120 MG 24 HR CAPSULE    TAKE 1 CAPSULE EVERY EVENING    Review of Systems  Constitutional: Negative for fever, chills, appetite change and fatigue.  Eyes: Positive for visual disturbance (double vision after taking Verapamil).  Respiratory: Negative for chest tightness and shortness of breath.   Cardiovascular: Positive for palpitations and leg swelling. Negative for chest pain.    Gastrointestinal: Negative for nausea, vomiting and abdominal pain.  Neurological: Negative for dizziness and weakness.    Social History  Substance Use Topics  . Smoking status: Never Smoker   . Smokeless tobacco: Not on file  . Alcohol Use: No   Objective:     Filed Vitals:   09/24/15 1615 09/24/15 1641  BP: 166/78 140/70  Pulse: 76   Temp: 97.5 F (36.4 C)   TempSrc: Oral   Resp: 18   Weight: 244 lb (110.678 kg)   SpO2: 96%      Physical Exam  General Appearance:    Alert, cooperative, no distress, obese  Eyes:    PERRL, conjunctiva/corneas clear, EOM's intact       Lungs:     Clear to auscultation bilaterally, respirations unlabored  Heart:    Regular rate and rhythm  Neurologic:   Awake, alert, oriented x 3. No apparent focal neurological           defect.       Results for orders placed or performed in visit on 09/24/15  POCT Urinalysis Dipstick  Result Value Ref Range   Color, UA yellow    Clarity, UA clear    Glucose, UA negative    Bilirubin, UA negative    Ketones, UA negative    Spec Grav, UA 1.025    Blood, UA negative    pH, UA 6.0    Protein, UA negative    Urobilinogen, UA 0.2    Nitrite, UA negative    Leukocytes, UA Negative Negative        Assessment & Plan:     1. Essential hypertension Stable since discontinuation of thiazide diuretic  2. Hyperuricemia Off of thiazide  3. Drug-induced gout of foot, unspecified chronicity, unspecified laterality Resolved since changing BP medications   Follow up in 4 months.         Lelon Huh, MD  [facility]

## 2015-10-02 DIAGNOSIS — H903 Sensorineural hearing loss, bilateral: Secondary | ICD-10-CM | POA: Diagnosis not present

## 2015-10-02 DIAGNOSIS — H6123 Impacted cerumen, bilateral: Secondary | ICD-10-CM | POA: Diagnosis not present

## 2015-10-30 ENCOUNTER — Other Ambulatory Visit: Payer: Self-pay | Admitting: Family Medicine

## 2015-11-14 ENCOUNTER — Other Ambulatory Visit: Payer: Self-pay | Admitting: Family Medicine

## 2015-11-20 ENCOUNTER — Other Ambulatory Visit: Payer: Self-pay | Admitting: Family Medicine

## 2015-11-20 DIAGNOSIS — I10 Essential (primary) hypertension: Secondary | ICD-10-CM

## 2016-01-08 ENCOUNTER — Encounter: Payer: Self-pay | Admitting: Family Medicine

## 2016-01-08 ENCOUNTER — Ambulatory Visit (INDEPENDENT_AMBULATORY_CARE_PROVIDER_SITE_OTHER): Payer: PPO | Admitting: Family Medicine

## 2016-01-08 VITALS — BP 130/78 | HR 77 | Temp 98.5°F | Resp 16 | Wt 249.0 lb

## 2016-01-08 DIAGNOSIS — E785 Hyperlipidemia, unspecified: Secondary | ICD-10-CM

## 2016-01-08 DIAGNOSIS — I10 Essential (primary) hypertension: Secondary | ICD-10-CM | POA: Diagnosis not present

## 2016-01-08 DIAGNOSIS — R7303 Prediabetes: Secondary | ICD-10-CM | POA: Diagnosis not present

## 2016-01-08 LAB — POCT GLYCOSYLATED HEMOGLOBIN (HGB A1C)
Est. average glucose Bld gHb Est-mCnc: 117
HEMOGLOBIN A1C: 5.7

## 2016-01-08 NOTE — Progress Notes (Signed)
Patient: Kayla Chan Female    DOB: 07-22-1932   82 y.o.   MRN: [id number] Visit Date: 01/08/2016  Today's Provider: Lelon Huh, MD   Chief Complaint  Patient presents with  . Follow-up  . Hypertension  . Hyperlipidemia  . Hypothyroidism   Subjective:    HPI  Follow-up for pre-diabetes from 07/01/2015; advised to avoid sweets and starchy foods.  Follow-up for gout from 09/24/2015. Has had no more gout flares since hctz was discontinued.     Hypertension, follow-up:  BP Readings from Last 3 Encounters:  01/08/16 130/78  09/24/15 140/70  07/17/15 120/68    She was last seen for hypertension 4 months ago.  BP at that visit was 140/70. Management since that visit includes; no changes, stable since discontinuing of thiazide diuretic.She reports good compliance with treatment. She is not having side effects. com  She is not exercising. She is adherent to low salt diet.   Outside blood pressures are 130/64. She is experiencing none.  Patient denies none.   Cardiovascular risk factors include prediabetes.  Use of agents associated with hypertension: none.   ----------------------------------------------------------------------    Lipid/Cholesterol, Follow-up:   Last seen for this 4 months ago.  Management since that visit includes; advised to avoid sweets and starchy foods.    Last Lipid Panel:    Component Value Date/Time   CHOL 235* 07/02/2015 0943   CHOL 249* 08/22/2014   TRIG 271* 07/02/2015 0943   HDL 45 07/02/2015 0943   HDL 47 08/22/2014   CHOLHDL 5.2* 07/02/2015 0943   LDLCALC 136* 07/02/2015 0943   LDLCALC 155 08/22/2014    She reports good compliance with treatment. (Zetia) She is not having side effects. none  Wt Readings from Last 3 Encounters:  01/08/16 249 lb (112.946 kg)  09/24/15 244 lb (110.678 kg)  07/17/15 240 lb (108.863 kg)    ----------------------------------------------------------------------     Allergies    Allergen Reactions  . Atorvastatin     Other reaction(s): Muscle Pain  . Colestid  [Colestipol Hcl]     Itching, insomnia  . Crestor  [Rosuvastatin Calcium]     Other reaction(s): Muscle Pain  . Doxycycline   . Fluvastatin Sodium     Other reaction(s): Muscle Pain  . Furosemide     Severe Cramping  . Penicillins   . Pravastatin Sodium     Numbness in legs   Previous Medications   ASPIRIN 81 MG TABLET    Take 81 mg by mouth daily.   COENZYME Q-10 100 MG CAPSULE    Take 1 capsule by mouth daily.   COLCHICINE 0.6 MG TABLET    Take 1 tablet (0.6 mg total) by mouth 2 (two) times daily as needed.   DESONIDE (DESOWEN) 0.05 % CREAM    Apply 1 application topically daily. Apply To Facial Rash As Needed   DICLOFENAC (CATAFLAM) 50 MG TABLET    TAKE 1 TABLET (50 MG TOTAL) BY MOUTH 2 (TWO) TIMES DAILY AS NEEDED (FOOT PAIN).   EZETIMIBE (ZETIA) 10 MG TABLET    Take 1 tablet by mouth every morning.   LEVOTHYROXINE (SYNTHROID, LEVOTHROID) 75 MCG TABLET    TAKE 1 TABLET BY MOUTH EVERY DAY   LISINOPRIL (PRINIVIL,ZESTRIL) 10 MG TABLET    TAKE 1 TABLET (10 MG TOTAL) BY MOUTH DAILY.   MOMETASONE (ELOCON) 0.1 % OINTMENT    Apply 1 application topically daily as needed. Apply to affected area   MULTIPLE VITAMINS-MINERALS (MULTIVITAMIN  ADULT PO)    Take 1 tablet by mouth daily.   SENNA (SENOKOT) 8.6 MG TABLET    Take 1 tablet by mouth daily.   TRAMADOL (ULTRAM) 50 MG TABLET    Take 1-2 tablets every eight hours as need for foot pain   VERAPAMIL (VERELAN PM) 120 MG 24 HR CAPSULE    TAKE 1 CAPSULE EVERY EVENING    Review of Systems  Constitutional: Negative for fever, chills, appetite change and fatigue.  Respiratory: Negative for chest tightness and shortness of breath.   Cardiovascular: Positive for leg swelling. Negative for chest pain and palpitations.  Gastrointestinal: Negative for nausea, vomiting and abdominal pain.  Musculoskeletal:       Leg pain  Neurological: Negative for dizziness and  weakness.    Social History  Substance Use Topics  . Smoking status: Never Smoker   . Smokeless tobacco: Not on file  . Alcohol Use: No   Objective:   BP 130/78 mmHg  Pulse 77  Temp(Src) 98.5 F (36.9 C) (Oral)  Resp 16  Wt 249 lb (112.946 kg)  SpO2 95%  Physical Exam  General Appearance:    Alert, cooperative, no distress  HENT:   ENT exam normal, no neck nodes or sinus tenderness  Eyes:    PERRL, conjunctiva/corneas clear, EOM's intact       Lungs:     Clear to auscultation bilaterally, respirations unlabored  Heart:    Regular rate and rhythm  Neurologic:   Awake, alert, oriented x 3. No apparent focal neurological           defect.        Results for orders placed or performed in visit on 01/08/16  POCT glycosylated hemoglobin (Hb A1C)  Result Value Ref Range   Hemoglobin A1C 5.7    Est. average glucose Bld gHb Est-mCnc 117        Assessment & Plan:     1. Pre-diabetes Well controlled with diet. Check a1c 1-2 x /yr - POCT glycosylated hemoglobin (Hb A1C) - EKG 12-Lead  2. HLD (hyperlipidemia) Doing well on Zetia.   3. Hypertension Well controlled off of hctz. Gout has resolved.      The entirety of the information documented in the History of Present Illness, Review of Systems and Physical Exam were personally obtained by me. Portions of this information were initially documented by April M. Sabra Heck, CMA and reviewed by me for thoroughness and accuracy.   Return in about 6 months (around 07/10/2016). Lelon Huh, MD  [facility]

## 2016-01-17 ENCOUNTER — Other Ambulatory Visit: Payer: Self-pay | Admitting: Family Medicine

## 2016-01-18 ENCOUNTER — Other Ambulatory Visit: Payer: Self-pay | Admitting: Family Medicine

## 2016-02-04 ENCOUNTER — Ambulatory Visit (INDEPENDENT_AMBULATORY_CARE_PROVIDER_SITE_OTHER): Payer: PPO | Admitting: Family Medicine

## 2016-02-04 ENCOUNTER — Encounter: Payer: Self-pay | Admitting: Family Medicine

## 2016-02-04 VITALS — BP 130/74 | HR 88 | Temp 98.0°F | Resp 16 | Wt 246.0 lb

## 2016-02-04 DIAGNOSIS — L718 Other rosacea: Secondary | ICD-10-CM | POA: Diagnosis not present

## 2016-02-04 DIAGNOSIS — E039 Hypothyroidism, unspecified: Secondary | ICD-10-CM

## 2016-02-04 DIAGNOSIS — L308 Other specified dermatitis: Secondary | ICD-10-CM | POA: Diagnosis not present

## 2016-02-04 DIAGNOSIS — R5383 Other fatigue: Secondary | ICD-10-CM | POA: Insufficient documentation

## 2016-02-04 DIAGNOSIS — R002 Palpitations: Secondary | ICD-10-CM | POA: Diagnosis not present

## 2016-02-04 NOTE — Progress Notes (Signed)
Patient: Kayla Chan Female    DOB: [birth year]   82 y.o.   MRN: [id number] Visit Date: 02/04/2016  Today's Provider: Lelon Huh, MD   Chief Complaint  Patient presents with  . AICD Problem   Subjective:    HPI  Patient states that she has been off balance and fatigued since right she saw [place] in May. Patient says that she feels fatigued and just doesn't feel well the last  Couple of days. She states she feels like heart is racing after she take verapamil in the evening, then starts feeling sleepy 10-15 minutes which has been going on for the last couple of week.   Wt Readings from Last 3 Encounters:  02/04/16 246 lb (111.585 kg)  01/08/16 249 lb (112.946 kg)  09/24/15 244 lb (110.678 kg)     Allergies  Allergen Reactions  . Atorvastatin     Other reaction(s): Muscle Pain  . Colestid  [Colestipol Hcl]     Itching, insomnia  . Crestor  [Rosuvastatin Calcium]     Other reaction(s): Muscle Pain  . Doxycycline   . Fluvastatin Sodium     Other reaction(s): Muscle Pain  . Furosemide     Severe Cramping  . Penicillins   . Pravastatin Sodium     Numbness in legs   Current Meds  Medication Sig  . aspirin 81 MG tablet Take 81 mg by mouth daily.  . Coenzyme Q-10 100 MG capsule Take 1 capsule by mouth daily.  Marland Kitchen desonide (DESOWEN) 0.05 % cream Apply 1 application topically daily. Apply To Facial Rash As Needed  . diclofenac (CATAFLAM) 50 MG tablet TAKE 1 TABLET (50 MG TOTAL) BY MOUTH 2 (TWO) TIMES DAILY AS NEEDED (FOOT PAIN).  Marland Kitchen ezetimibe (ZETIA) 10 MG tablet TAKE 1 TABLET BY MOUTH IN THE MORNING  . hydrocortisone 2.5 % lotion APPLY TO FACE ONCE DAILY AS NEEDED FOR ITCHINESS  . levothyroxine (SYNTHROID, LEVOTHROID) 75 MCG tablet TAKE 1 TABLET BY MOUTH EVERY DAY  . lisinopril (PRINIVIL,ZESTRIL) 10 MG tablet TAKE 1 TABLET (10 MG TOTAL) BY MOUTH DAILY.  . mometasone (ELOCON) 0.1 % ointment Apply 1 application topically daily as needed. Apply to affected area  . Multiple  Vitamins-Minerals (MULTIVITAMIN ADULT PO) Take 1 tablet by mouth daily.  Marland Kitchen senna (SENOKOT) 8.6 MG tablet Take 1 tablet by mouth daily.  . traMADol (ULTRAM) 50 MG tablet Take 1-2 tablets every eight hours as need for foot pain  . verapamil (VERELAN PM) 120 MG 24 hr capsule TAKE 1 CAPSULE EVERY EVENING    Review of Systems  Constitutional: Negative for fever, chills, appetite change and fatigue.  Respiratory: Negative for chest tightness and shortness of breath.   Cardiovascular: Negative for chest pain and palpitations.  Gastrointestinal: Negative for nausea, vomiting and abdominal pain.  Neurological: Negative for dizziness and weakness.    Social History  Substance Use Topics  . Smoking status: Never Smoker   . Smokeless tobacco: Not on file  . Alcohol Use: No   Objective:   BP 130/74 mmHg  Pulse 88  Temp(Src) 98 F (36.7 C) (Oral)  Resp 16  Wt 246 lb (111.585 kg)  SpO2 98%  Physical Exam  General Appearance:    Alert, cooperative, no distress, obese  Eyes:    PERRL, conjunctiva/corneas clear, EOM's intact       Lungs:     Clear to auscultation bilaterally, respirations unlabored  Heart:    Regular rate and rhythm  Neurologic:   Awake, alert, oriented x 3. No apparent focal neurological           defect.          Assessment & Plan:     1. Hypothyroidism, unspecified hypothyroidism type  - T4 AND TSH  2. Other fatigue  - Comprehensive metabolic panel - CBC  3. Palpitations  - Magnesium     The entirety of the information documented in the History of Present Illness, Review of Systems and Physical Exam were personally obtained by me. Portions of this information were initially documented by April M. Sabra Heck, CMA and reviewed by me for thoroughness and accuracy.    Lelon Huh, MD  [facility]

## 2016-02-05 LAB — COMPREHENSIVE METABOLIC PANEL
A/G RATIO: 1.5 (ref 1.2–2.2)
ALBUMIN: 4.3 g/dL (ref 3.5–4.7)
ALT: 11 IU/L (ref 0–32)
AST: 26 IU/L (ref 0–40)
Alkaline Phosphatase: 77 IU/L (ref 39–117)
BILIRUBIN TOTAL: 0.3 mg/dL (ref 0.0–1.2)
BUN / CREAT RATIO: 27 (ref 12–28)
BUN: 26 mg/dL (ref 8–27)
CALCIUM: 9.9 mg/dL (ref 8.7–10.3)
CHLORIDE: 100 mmol/L (ref 96–106)
CO2: 20 mmol/L (ref 18–29)
Creatinine, Ser: 0.96 mg/dL (ref 0.57–1.00)
GFR, EST AFRICAN AMERICAN: 64 mL/min/{1.73_m2} (ref >59)
GFR, EST NON AFRICAN AMERICAN: 56 mL/min/{1.73_m2} — AB (ref >59)
GLUCOSE: 98 mg/dL (ref 65–99)
Globulin, Total: 2.8 g/dL (ref 1.5–4.5)
Potassium: 5.2 mmol/L (ref 3.5–5.2)
Sodium: 143 mmol/L (ref 134–144)
TOTAL PROTEIN: 7.1 g/dL (ref 6.0–8.5)

## 2016-02-05 LAB — CBC
HEMATOCRIT: 42 % (ref 34.0–46.6)
Hemoglobin: 13.7 g/dL (ref 11.1–15.9)
MCH: 30.4 pg (ref 26.6–33.0)
MCHC: 32.6 g/dL (ref 31.5–35.7)
MCV: 93 fL (ref 79–97)
Platelets: 372 10*3/uL (ref 150–379)
RBC: 4.5 x10E6/uL (ref 3.77–5.28)
RDW: 14.6 % (ref 12.3–15.4)
WBC: 8.1 10*3/uL (ref 3.4–10.8)

## 2016-02-05 LAB — MAGNESIUM: Magnesium: 2.3 mg/dL (ref 1.6–2.3)

## 2016-02-05 LAB — T4 AND TSH
T4 TOTAL: 10.3 ug/dL (ref 4.5–12.0)
TSH: 2.58 u[IU]/mL (ref 0.450–4.500)

## 2016-02-07 ENCOUNTER — Telehealth: Payer: Self-pay | Admitting: Family Medicine

## 2016-02-07 NOTE — Telephone Encounter (Signed)
Please advise results? 

## 2016-02-07 NOTE — Telephone Encounter (Signed)
Pt calling requesting her lab results. Thanks CC

## 2016-02-10 NOTE — Telephone Encounter (Signed)
Please see result note 

## 2016-04-06 DIAGNOSIS — T8484XA Pain due to internal orthopedic prosthetic devices, implants and grafts, initial encounter: Secondary | ICD-10-CM | POA: Diagnosis not present

## 2016-04-06 DIAGNOSIS — L718 Other rosacea: Secondary | ICD-10-CM | POA: Diagnosis not present

## 2016-04-06 DIAGNOSIS — L308 Other specified dermatitis: Secondary | ICD-10-CM | POA: Diagnosis not present

## 2016-04-06 DIAGNOSIS — Z96659 Presence of unspecified artificial knee joint: Secondary | ICD-10-CM | POA: Diagnosis not present

## 2016-04-06 DIAGNOSIS — M25561 Pain in right knee: Secondary | ICD-10-CM | POA: Diagnosis not present

## 2016-04-07 ENCOUNTER — Telehealth: Payer: Self-pay | Admitting: Family Medicine

## 2016-04-07 NOTE — Telephone Encounter (Signed)
Patient was notified.

## 2016-04-07 NOTE — Telephone Encounter (Signed)
Pt states Dr Rudene Christians gave a Rx for Etodolac 500mg .  Pt is asking for Dr Caryn Section to approve this Rx due to the side effects.  CB#[phone]/MW

## 2016-04-07 NOTE — Telephone Encounter (Signed)
Its OK for her to take etodolac.

## 2016-04-22 DIAGNOSIS — Z96659 Presence of unspecified artificial knee joint: Secondary | ICD-10-CM | POA: Diagnosis not present

## 2016-04-22 DIAGNOSIS — T8484XD Pain due to internal orthopedic prosthetic devices, implants and grafts, subsequent encounter: Secondary | ICD-10-CM | POA: Diagnosis not present

## 2016-05-30 ENCOUNTER — Ambulatory Visit (INDEPENDENT_AMBULATORY_CARE_PROVIDER_SITE_OTHER): Payer: PPO

## 2016-05-30 DIAGNOSIS — Z23 Encounter for immunization: Secondary | ICD-10-CM | POA: Diagnosis not present

## 2016-07-02 DIAGNOSIS — M9901 Segmental and somatic dysfunction of cervical region: Secondary | ICD-10-CM | POA: Diagnosis not present

## 2016-07-02 DIAGNOSIS — M9903 Segmental and somatic dysfunction of lumbar region: Secondary | ICD-10-CM | POA: Diagnosis not present

## 2016-07-02 DIAGNOSIS — M9902 Segmental and somatic dysfunction of thoracic region: Secondary | ICD-10-CM | POA: Diagnosis not present

## 2016-07-07 DIAGNOSIS — M9903 Segmental and somatic dysfunction of lumbar region: Secondary | ICD-10-CM | POA: Diagnosis not present

## 2016-07-07 DIAGNOSIS — M9901 Segmental and somatic dysfunction of cervical region: Secondary | ICD-10-CM | POA: Diagnosis not present

## 2016-07-07 DIAGNOSIS — M9902 Segmental and somatic dysfunction of thoracic region: Secondary | ICD-10-CM | POA: Diagnosis not present

## 2016-07-09 DIAGNOSIS — D485 Neoplasm of uncertain behavior of skin: Secondary | ICD-10-CM | POA: Diagnosis not present

## 2016-07-09 DIAGNOSIS — C44311 Basal cell carcinoma of skin of nose: Secondary | ICD-10-CM | POA: Diagnosis not present

## 2016-07-13 ENCOUNTER — Ambulatory Visit (INDEPENDENT_AMBULATORY_CARE_PROVIDER_SITE_OTHER): Payer: PPO | Admitting: Family Medicine

## 2016-07-13 ENCOUNTER — Encounter: Payer: Self-pay | Admitting: Family Medicine

## 2016-07-13 VITALS — BP 110/80 | HR 72 | Temp 95.0°F | Resp 16 | Ht 59.0 in | Wt 245.0 lb

## 2016-07-13 DIAGNOSIS — I10 Essential (primary) hypertension: Secondary | ICD-10-CM

## 2016-07-13 DIAGNOSIS — R7303 Prediabetes: Secondary | ICD-10-CM

## 2016-07-13 DIAGNOSIS — E785 Hyperlipidemia, unspecified: Secondary | ICD-10-CM | POA: Diagnosis not present

## 2016-07-13 DIAGNOSIS — E790 Hyperuricemia without signs of inflammatory arthritis and tophaceous disease: Secondary | ICD-10-CM | POA: Diagnosis not present

## 2016-07-13 NOTE — Progress Notes (Signed)
Patient: Kayla Chan Female    DOB: Jan 02, 1932   82 y.o.   MRN: [id number] Visit Date: 07/13/2016  Today's Provider: Lelon Huh, MD   Chief Complaint  Patient presents with  . Follow-up  . Hypertension  . Hypothyroidism  . Hyperlipidemia   Subjective:    Patient is still having foot pain.    Hypothyroidism, unspecified hypothyroidism type From 02/04/2016-no changesT4 AND TSH  Pre-diabetes From 01/08/2016-Well controlled with diet. Check  A1c 1-2 times a year.   Hypertension, follow-up:  BP Readings from Last 3 Encounters:  07/13/16 110/80  02/04/16 130/74  01/08/16 130/78    She was last seen for hypertension 6 months ago.  BP at that visit was 130/78. Management since that visit includes; no changes.She reports good compliance with treatment. She is not having side effects. none She is not exercising. She is adherent to low salt diet.   Outside blood pressures are 120/60. She is experiencing none.  Patient denies none   Cardiovascular risk factors include prediabetes.  Use of agents associated with hypertension: none.   ----------------------------------------------------------------    Lipid/Cholesterol, Follow-up:   Last seen for this 6 months ago.  Management since that visit includes; no changes.  Last Lipid Panel:    Component Value Date/Time   CHOL 235 (H) 07/02/2015 0943   TRIG 271 (H) 07/02/2015 0943   HDL 45 07/02/2015 0943   CHOLHDL 5.2 (H) 07/02/2015 0943   LDLCALC 136 (H) 07/02/2015 [id number]    She reports good compliance with treatment. She is not having side effects. none  Wt Readings from Last 3 Encounters:  07/13/16 245 lb (111.1 kg)  02/04/16 246 lb (111.6 kg)  01/08/16 249 lb (112.9 kg)    ----------------------------------------------------------------    Allergies  Allergen Reactions  . Atorvastatin     Other reaction(s): Muscle Pain  . Colestid  [Colestipol Hcl]     Itching, insomnia  . Crestor   [Rosuvastatin Calcium]     Other reaction(s): Muscle Pain  . Doxycycline   . Fluvastatin Sodium     Other reaction(s): Muscle Pain  . Furosemide     Severe Cramping  . Penicillins   . Pravastatin Sodium     Numbness in legs     Current Outpatient Prescriptions:  .  aspirin 81 MG tablet, Take 81 mg by mouth daily., Disp: , Rfl:  .  diclofenac (CATAFLAM) 50 MG tablet, TAKE 1 TABLET (50 MG TOTAL) BY MOUTH 2 (TWO) TIMES DAILY AS NEEDED (FOOT PAIN)., Disp: 30 tablet, Rfl: 2 .  ezetimibe (ZETIA) 10 MG tablet, TAKE 1 TABLET BY MOUTH IN THE MORNING, Disp: 30 tablet, Rfl: 11 .  levothyroxine (SYNTHROID, LEVOTHROID) 75 MCG tablet, TAKE 1 TABLET BY MOUTH EVERY DAY, Disp: 90 tablet, Rfl: 4 .  lisinopril (PRINIVIL,ZESTRIL) 10 MG tablet, TAKE 1 TABLET (10 MG TOTAL) BY MOUTH DAILY., Disp: 90 tablet, Rfl: 4 .  Multiple Vitamins-Minerals (MULTIVITAMIN ADULT PO), Take 1 tablet by mouth daily., Disp: , Rfl:  .  senna (SENOKOT) 8.6 MG tablet, Take 1 tablet by mouth daily., Disp: , Rfl:  .  traMADol (ULTRAM) 50 MG tablet, Take 1-2 tablets every eight hours as need for foot pain, Disp: 60 tablet, Rfl: 1 .  verapamil (VERELAN PM) 120 MG 24 hr capsule, TAKE 1 CAPSULE EVERY EVENING, Disp: 30 capsule, Rfl: 12  Review of Systems  Constitutional: Negative for appetite change, chills, fatigue and fever.  Respiratory: Negative for chest tightness and shortness of  breath.   Cardiovascular: Negative for chest pain and palpitations.  Gastrointestinal: Negative for abdominal pain, nausea and vomiting.  Neurological: Negative for dizziness and weakness.    Social History  Substance Use Topics  . Smoking status: Never Smoker  . Smokeless tobacco: Not on file  . Alcohol use No   Objective:   BP 110/80 (BP Location: Right Arm, Patient Position: Sitting, Cuff Size: Normal)   Pulse 72   Temp (!) 95 F (35 C) (Oral)   Resp 16   Ht 4\' 11"  (1.499 m)   Wt 245 lb (111.1 kg)   SpO2 95%   BMI 49.48 kg/m    Depression screen [facility] 2/9 07/13/2016 07/01/2015  Decreased Interest 0 0  Down, Depressed, Hopeless 0 0  PHQ - 2 Score 0 0  Altered sleeping 0 -  Tired, decreased energy 0 -  Change in appetite 0 -  Feeling bad or failure about yourself  0 -  Trouble concentrating 0 -  Moving slowly or fidgety/restless 0 -  Suicidal thoughts 0 -  PHQ-9 Score 0 -  Difficult doing work/chores Not difficult at all -    Fall Risk  07/13/2016 07/01/2015  Falls in the past year? No No    Functional Status Survey: Is the patient deaf or have difficulty hearing?: Yes Does the patient have difficulty seeing, even when wearing glasses/contacts?: No Does the patient have difficulty concentrating, remembering, or making decisions?: No Does the patient have difficulty walking or climbing stairs?: No Does the patient have difficulty dressing or bathing?: No Does the patient have difficulty doing errands alone such as visiting a doctor's office or shopping?: No  Is the patient deaf or have difficulty hearing?: Yes Does the patient have difficulty seeing, even when wearing glasses/contacts?: No Does the patient have difficulty concentrating, remembering, or making decisions?: No Does the patient have difficulty walking or climbing stairs?: No Does the patient have difficulty dressing or bathing?: No Does the patient have difficulty doing errands alone such as visiting a doctor's office or shopping?: No   Physical Exam  General Appearance:    Alert, cooperative, no distress, obese  Eyes:    PERRL, conjunctiva/corneas clear, EOM's intact       Lungs:     Clear to auscultation bilaterally, respirations unlabored  Heart:    Regular rate and rhythm  Neurologic:   Awake, alert, oriented x 3. No apparent focal neurological           defect.            Assessment & Plan:     1. Pre-diabetes  - Hemoglobin A1c  2. Hyperlipidemia, unspecified hyperlipidemia type Tolerating Zetia well with no adverse  effects.  - Lipid panel  3. . Essential hypertension Well controlled.  Continue current medications.   - Renal function panel  Return in about 6 months (around 01/10/2017).     The entirety of the information documented in the History of Present Illness, Review of Systems and Physical Exam were personally obtained by me. Portions of this information were initially documented by April M. Sabra Heck, CMA and reviewed by me for thoroughness and accuracy.    Lelon Huh, MD  [facility]

## 2016-07-15 DIAGNOSIS — E785 Hyperlipidemia, unspecified: Secondary | ICD-10-CM | POA: Diagnosis not present

## 2016-07-15 DIAGNOSIS — R7303 Prediabetes: Secondary | ICD-10-CM | POA: Diagnosis not present

## 2016-07-15 DIAGNOSIS — I10 Essential (primary) hypertension: Secondary | ICD-10-CM | POA: Diagnosis not present

## 2016-07-16 LAB — LIPID PANEL
CHOL/HDL RATIO: 4.5 ratio — AB (ref 0.0–4.4)
Cholesterol, Total: 231 mg/dL — ABNORMAL HIGH (ref 100–199)
HDL: 51 mg/dL (ref >39)
LDL CALC: 135 mg/dL — AB (ref 0–99)
Triglycerides: 226 mg/dL — ABNORMAL HIGH (ref 0–149)
VLDL Cholesterol Cal: 45 mg/dL — ABNORMAL HIGH (ref 5–40)

## 2016-07-16 LAB — RENAL FUNCTION PANEL
Albumin: 4.1 g/dL (ref 3.5–4.7)
BUN/Creatinine Ratio: 24 (ref 12–28)
BUN: 23 mg/dL (ref 8–27)
CALCIUM: 9.7 mg/dL (ref 8.7–10.3)
CO2: 23 mmol/L (ref 18–29)
Chloride: 102 mmol/L (ref 96–106)
Creatinine, Ser: 0.97 mg/dL (ref 0.57–1.00)
GFR calc Af Amer: 63 mL/min/{1.73_m2} (ref >59)
GFR calc non Af Amer: 55 mL/min/{1.73_m2} — ABNORMAL LOW (ref >59)
GLUCOSE: 101 mg/dL — AB (ref 65–99)
PHOSPHORUS: 3.7 mg/dL (ref 2.5–4.5)
POTASSIUM: 5 mmol/L (ref 3.5–5.2)
SODIUM: 140 mmol/L (ref 134–144)

## 2016-07-16 LAB — HEMOGLOBIN A1C
ESTIMATED AVERAGE GLUCOSE: 120 mg/dL
Hgb A1c MFr Bld: 5.8 % — ABNORMAL HIGH (ref 4.8–5.6)

## 2016-07-20 ENCOUNTER — Telehealth: Payer: Self-pay

## 2016-07-20 NOTE — Telephone Encounter (Signed)
Patient has been advised. KW 

## 2016-07-20 NOTE — Telephone Encounter (Signed)
-----   Message from Birdie Sons, MD sent at 07/20/2016  9:43 AM EST ----- Cholesterol is stable at 231. Average blood sugar is stable at 120. In 'pre-diabetic' range. Continue current medications.  Follow up 6 months.

## 2016-07-24 ENCOUNTER — Telehealth: Payer: Self-pay | Admitting: Family Medicine

## 2016-07-24 NOTE — Telephone Encounter (Signed)
Called Pt to re-schedule AWV with [facility] - knb 12/15 at 3:00pm work for  Her?

## 2016-07-27 DIAGNOSIS — H903 Sensorineural hearing loss, bilateral: Secondary | ICD-10-CM | POA: Diagnosis not present

## 2016-07-27 DIAGNOSIS — H6123 Impacted cerumen, bilateral: Secondary | ICD-10-CM | POA: Diagnosis not present

## 2016-08-12 ENCOUNTER — Ambulatory Visit: Payer: PPO

## 2016-08-19 ENCOUNTER — Ambulatory Visit (INDEPENDENT_AMBULATORY_CARE_PROVIDER_SITE_OTHER): Payer: PPO

## 2016-08-19 VITALS — BP 134/68 | HR 68 | Temp 97.7°F | Ht 59.0 in | Wt 246.5 lb

## 2016-08-19 DIAGNOSIS — Z0000 Encounter for general adult medical examination without abnormal findings: Secondary | ICD-10-CM | POA: Diagnosis not present

## 2016-08-19 NOTE — Progress Notes (Signed)
Subjective:   Kayla Chan is a 82 y.o. female who presents for Medicare Annual (Subsequent) preventive examination.  Review of Systems:  N/A   Cardiac Risk Factors include: advanced age (>63men, >60 women);dyslipidemia;hypertension;obesity (BMI >30kg/m2)     Objective:     Vitals: BP 134/68 (BP Location: Left Arm)   Pulse 68   Temp 97.7 F (36.5 C) (Oral)   Ht 4\' 11"  (1.499 m)   Wt 246 lb 8 oz (111.8 kg)   BMI 49.79 kg/m   Body mass index is 49.79 kg/m.   Tobacco History  Smoking Status  . Never Smoker  Smokeless Tobacco  . Never Used     Counseling given: Not Answered   Past Medical History:  Diagnosis Date  . Cancer ([place])    [place]o on nose   Past Surgical History:  Procedure Laterality Date  . 24 hour Holter Monitor  01/2013   4550 ectopic beats with 344 superventricular bigemminy events  . Abdominal Ultrasound  05/31/2013   RUQ. Fatty Liver  . APPENDECTOMY  1950   [place]  . 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, [place]; Right   Family History  Problem Relation Age of Onset  . Stroke Mother     possible stroke  . Heart Problems Father   . Kidney cancer Sister   . Bladder Cancer Brother    History  Sexual Activity  . Sexual activity: Not on file    Outpatient Encounter Prescriptions as of 08/19/2016  Medication Sig  . aspirin 81 MG tablet Take 81 mg by mouth daily.  . diclofenac (CATAFLAM) 50 MG tablet TAKE 1 TABLET (50 MG TOTAL) BY MOUTH 2 (TWO) TIMES DAILY AS NEEDED (FOOT PAIN).  Marland Kitchen ezetimibe (ZETIA) 10 MG tablet TAKE 1 TABLET BY MOUTH IN THE MORNING  . levothyroxine (SYNTHROID, LEVOTHROID) 75 MCG tablet TAKE 1 TABLET BY MOUTH EVERY DAY  . lisinopril (PRINIVIL,ZESTRIL) 10 MG tablet TAKE 1 TABLET (10 MG TOTAL) BY MOUTH DAILY.  . Multiple Vitamins-Minerals (MULTIVITAMIN ADULT PO) Take 1 tablet by mouth daily.  . verapamil (VERELAN PM) 120 MG 24 hr capsule TAKE 1 CAPSULE EVERY EVENING  . senna (SENOKOT) 8.6 MG  tablet Take 1 tablet by mouth daily.  . traMADol (ULTRAM) 50 MG tablet Take 1-2 tablets every eight hours as need for foot pain (Patient not taking: Reported on 08/19/2016)   No facility-administered encounter medications on file as of 08/19/2016.     Activities of Daily Living In your present state of health, do you have any difficulty performing the following activities: 08/19/2016 07/13/2016  Hearing? Tempie Donning  Vision? N N  Difficulty concentrating or making decisions? N N  Walking or climbing stairs? Y N  Dressing or bathing? N N  Doing errands, shopping? N N  Preparing Food and eating ? N -  Using the Toilet? N -  In the past six months, have you accidently leaked urine? N -  Do you have problems with loss of bowel control? N -  Managing your Medications? N -  Managing your Finances? N -  Housekeeping or managing your Housekeeping? N -  Some recent data might be hidden    Patient Care Team: Birdie Sons, MD as PCP - General (Family Medicine) Ronnell Freshwater, MD as Referring Physician (Ophthalmology) Regino Schultze, MD as Referring Physician (Dermatology)    Assessment:     Exercise Activities and Dietary recommendations Current Exercise Habits: Home exercise routine, Type of exercise:  strength training/weights, Time (Minutes): 10, Frequency (Times/Week): 3, Weekly Exercise (Minutes/Week): 30, Intensity: Mild, Exercise limited by: orthopedic condition(s)  Goals    . Increase water intake          Starting 08/19/16, I will continue to drink 5 glasses of water a day.      Fall Risk Fall Risk  08/19/2016 07/13/2016 07/01/2015  Falls in the past year? No No No   Depression Screen PHQ 2/9 Scores 08/19/2016 07/13/2016 07/01/2015  PHQ - 2 Score 0 0 0  PHQ- 9 Score - 0 -     Cognitive Function     6CIT Screen 08/19/2016  What Year? 0 points  What month? 0 points  What time? 0 points  Count back from 20 0 points  Months in reverse 0 points  Repeat phrase  4 points  Total Score 4    Immunization History  Administered Date(s) Administered  . Influenza, High Dose Seasonal PF 05/31/2015, 05/30/2016  . Pneumococcal Conjugate-13 05/09/2014  . Pneumococcal Polysaccharide-23 05/24/2006   Screening Tests Health Maintenance  Topic Date Due  . ZOSTAVAX  08/23/2016 (Originally 05/07/1994)  . TETANUS/TDAP  08/23/2016 (Originally 05/07/1953)  . DEXA SCAN  08/19/2026 (Originally 05/08/1999)  . INFLUENZA VACCINE  Completed  . PNA vac Low Risk Adult  Completed      Plan:  I have personally reviewed and addressed the Medicare Annual Wellness questionnaire and have noted the following in the patient's chart:  A. Medical and social history B. Use of alcohol, tobacco or illicit drugs  C. Current medications and supplements D. Functional ability and status E.  Nutritional status F.  Physical activity G. Advance directives H. List of other physicians I.  Hospitalizations, surgeries, and ER visits in previous 12 months J.  [place] such as hearing and vision if needed, cognitive and depression L. Referrals and appointments - none  In addition, I have reviewed and discussed with patient certain preventive protocols, quality metrics, and best practice recommendations. A written personalized care plan for preventive services as well as general preventive health recommendations were provided to patient.  See attached scanned questionnaire for additional information.   Signed,  Fabio Neighbors, LPN Nurse Health Advisor   MD Recommendations: follow up on Zostavax and tetanus vaccine. Pt declined today and will check with insurance and follow up at [facility] with PCP.  I have reviewed the health advisor's note, was available for consultation, and agree with documentation and plan  Lelon Huh, MD

## 2016-08-19 NOTE — Patient Instructions (Signed)
Kayla Chan , Thank you for taking time to come for your Medicare Wellness Visit. I appreciate your ongoing commitment to your health goals. Please review the following plan we discussed and let me know if I can assist you in the future.   These are the goals we discussed: Goals    . Increase water intake          Starting 08/19/16, I will continue to drink 5 glasses of water a day.       This is a list of the screening recommended for you and due dates:  Health Maintenance  Topic Date Due  . Tetanus Vaccine  05/07/1953  . Shingles Vaccine  05/07/1994  . DEXA scan (bone density measurement)  05/08/1999  . Flu Shot  Completed  . Pneumonia vaccines  Completed   Preventive Care for Adults  A healthy lifestyle and preventive care can promote health and wellness. Preventive health guidelines for adults include the following key practices.  . A routine yearly physical is a good way to check with your health care provider about your health and preventive screening. It is a chance to share any concerns and updates on your health and to receive a thorough exam.  . Visit your dentist for a routine exam and preventive care every 6 months. Brush your teeth twice a day and floss once a day. Good oral hygiene prevents tooth decay and gum disease.  . The frequency of eye exams is based on your age, health, family medical history, use  of contact lenses, and other factors. Follow your health care provider's ecommendations for frequency of eye exams.  . Eat a healthy diet. Foods like vegetables, fruits, whole grains, low-fat dairy products, and lean protein foods contain the nutrients you need without too many calories. Decrease your intake of foods high in solid fats, added sugars, and salt. Eat the right amount of calories for you. Get information about a proper diet from your health care provider, if necessary.  . Regular physical exercise is one of the most important things you can do for your  health. Most adults should get at least 150 minutes of moderate-intensity exercise (any activity that increases your heart rate and causes you to sweat) each week. In addition, most adults need muscle-strengthening exercises on 2 or more days a week.  Silver Sneakers may be a benefit available to you. To determine eligibility, you may visit the website: www.silversneakers.com or contact program at 5595690199 Mon-Fri between 8AM-8PM.   . Maintain a healthy weight. The body mass index (BMI) is a screening tool to identify possible weight problems. It provides an estimate of body fat based on height and weight. Your health care provider can find your BMI and can help you achieve or maintain a healthy weight.   For adults 20 years and older: ? A BMI below 18.5 is considered underweight. ? A BMI of 18.5 to 24.9 is normal. ? A BMI of 25 to 29.9 is considered overweight. ? A BMI of 30 and above is considered obese.   . Maintain normal blood lipids and cholesterol levels by exercising and minimizing your intake of saturated fat. Eat a balanced diet with plenty of fruit and vegetables. Blood tests for lipids and cholesterol should begin at age 89 and be repeated every 5 years. If your lipid or cholesterol levels are high, you are over 50, or you are at high risk for heart disease, you may need your cholesterol levels checked more frequently. Ongoing  high lipid and cholesterol levels should be treated with medicines if diet and exercise are not working.  . If you smoke, find out from your health care provider how to quit. If you do not use tobacco, please do not start.  . If you choose to drink alcohol, please do not consume more than 2 drinks per day. One drink is considered to be 12 ounces (355 mL) of beer, 5 ounces (148 mL) of wine, or 1.5 ounces (44 mL) of liquor.  . If you are 82-45 years old, ask your health care provider if you should take aspirin to prevent strokes.  . Use sunscreen. Apply  sunscreen liberally and repeatedly throughout the day. You should seek shade when your shadow is shorter than you. Protect yourself by wearing long sleeves, pants, a wide-brimmed hat, and sunglasses year round, whenever you are outdoors.  . Once a month, do a whole body skin exam, using a mirror to look at the skin on your back. Tell your health care provider of new moles, moles that have irregular borders, moles that are larger than a pencil eraser, or moles that have changed in shape or color.

## 2016-09-16 ENCOUNTER — Ambulatory Visit: Payer: PPO | Admitting: Family Medicine

## 2016-09-28 ENCOUNTER — Other Ambulatory Visit: Payer: Self-pay | Admitting: Family Medicine

## 2016-10-10 ENCOUNTER — Ambulatory Visit (INDEPENDENT_AMBULATORY_CARE_PROVIDER_SITE_OTHER): Payer: PPO | Admitting: Physician Assistant

## 2016-10-10 ENCOUNTER — Encounter: Payer: Self-pay | Admitting: Physician Assistant

## 2016-10-10 VITALS — BP 144/80 | HR 70 | Temp 97.4°F | Resp 16 | Wt 243.0 lb

## 2016-10-10 DIAGNOSIS — J069 Acute upper respiratory infection, unspecified: Secondary | ICD-10-CM | POA: Diagnosis not present

## 2016-10-10 DIAGNOSIS — J029 Acute pharyngitis, unspecified: Secondary | ICD-10-CM | POA: Diagnosis not present

## 2016-10-10 MED ORDER — AZITHROMYCIN 250 MG PO TABS: ORAL_TABLET | ORAL | 0 refills | Status: DC

## 2016-10-10 NOTE — Patient Instructions (Signed)

## 2016-10-10 NOTE — Progress Notes (Signed)
Patient: Kayla Chan Female    DOB: 21-Mar-1932   83 y.o.   MRN: [id number] Visit Date: 10/10/2016  Today's Provider: Mar Daring, PA-C   Chief Complaint  Patient presents with  . Sore Throat   Subjective:    Sore throat started 2-3 days ago. Patient stated last night her throat pain was bad. Patient is hoarse and is having trouble swallowing. Patient has taken a cough syrup with no relief.     Sore Throat   This is a new problem. The current episode started in the past 7 days (2-3 days ago ). The problem has been gradually worsening. There has been no fever. The pain is at a severity of 5/10. The pain is moderate. Associated symptoms include congestion, coughing, ear pain (fullness not pain; since the flight home from [place]), a hoarse voice, a plugged ear sensation, shortness of breath and trouble swallowing. Pertinent negatives include no abdominal pain, ear discharge, headaches, neck pain, stridor, swollen glands or vomiting. She has had no exposure to strep or mono. She has tried nothing for the symptoms.   They recently (approx 2 weeks ago) had to go to [facility] because her sister became ill and passed. They spent about a week in a nursing facility that her sister resided in, stayed through the funeral, then her and her husband both developed URI symptoms upon returning home from [place].    Allergies  Allergen Reactions  . Atorvastatin     Other reaction(s): Muscle Pain  . Colestid  [Colestipol Hcl]     Itching, insomnia  . Crestor  [Rosuvastatin Calcium]     Other reaction(s): Muscle Pain  . Doxycycline   . Fluvastatin Sodium     Other reaction(s): Muscle Pain  . Furosemide     Severe Cramping  . Penicillins   . Pravastatin Sodium     Numbness in legs     Current Outpatient Prescriptions:  .  aspirin 81 MG tablet, Take 81 mg by mouth daily., Disp: , Rfl:  .  diclofenac (CATAFLAM) 50 MG tablet, TAKE 1 TABLET (50 MG TOTAL) BY MOUTH 2 (TWO) TIMES DAILY  AS NEEDED (FOOT PAIN)., Disp: 30 tablet, Rfl: 2 .  ezetimibe (ZETIA) 10 MG tablet, TAKE 1 TABLET BY MOUTH IN THE MORNING, Disp: 30 tablet, Rfl: 11 .  levothyroxine (SYNTHROID, LEVOTHROID) 75 MCG tablet, TAKE 1 TABLET BY MOUTH EVERY DAY, Disp: 90 tablet, Rfl: 4 .  lisinopril (PRINIVIL,ZESTRIL) 10 MG tablet, TAKE 1 TABLET (10 MG TOTAL) BY MOUTH DAILY., Disp: 90 tablet, Rfl: 4 .  mometasone (ELOCON) 0.1 % ointment, APPLY 1 APPLICATION TOPICALLY DAILY AS NEEDED. APPLY TO AFFECTED AREA, Disp: 15 g, Rfl: 5 .  Multiple Vitamins-Minerals (MULTIVITAMIN ADULT PO), Take 1 tablet by mouth daily., Disp: , Rfl:  .  senna (SENOKOT) 8.6 MG tablet, Take 1 tablet by mouth daily., Disp: , Rfl:  .  traMADol (ULTRAM) 50 MG tablet, Take 1-2 tablets every eight hours as need for foot pain, Disp: 60 tablet, Rfl: 1 .  verapamil (VERELAN PM) 120 MG 24 hr capsule, TAKE 1 CAPSULE EVERY EVENING, Disp: 30 capsule, Rfl: 12  Review of Systems  Constitutional: Negative for appetite change, chills, fatigue and fever.  HENT: Positive for congestion, ear pain (fullness not pain; since the flight home from [place]), hearing loss, hoarse voice, rhinorrhea, sore throat and trouble swallowing. Negative for ear discharge, postnasal drip, sinus pain and sinus pressure.   Respiratory: Positive for cough and  shortness of breath. Negative for chest tightness, wheezing and stridor.   Cardiovascular: Negative for chest pain and palpitations.  Gastrointestinal: Negative for abdominal pain, nausea and vomiting.  Musculoskeletal: Negative for neck pain.  Neurological: Negative for dizziness, weakness and headaches.    Social History  Substance Use Topics  . Smoking status: Never Smoker  . Smokeless tobacco: Never Used  . Alcohol use No   Objective:   BP (!) 144/80 (BP Location: Right Arm, Patient Position: Sitting, Cuff Size: Large)   Pulse 70   Temp 97.4 F (36.3 C) (Oral)   Resp 16   Wt 243 lb (110.2 kg)   SpO2 96%   BMI 49.08  kg/m   Physical Exam  Constitutional: She appears well-developed and well-nourished. No distress.  HENT:  Head: Normocephalic and atraumatic.  Right Ear: Hearing, tympanic membrane, external ear and ear canal normal.  Left Ear: Hearing, tympanic membrane, external ear and ear canal normal.  Nose: Mucosal edema and rhinorrhea present. Right sinus exhibits no maxillary sinus tenderness and no frontal sinus tenderness. Left sinus exhibits no maxillary sinus tenderness and no frontal sinus tenderness.  Mouth/Throat: Uvula is midline and mucous membranes are normal. Posterior oropharyngeal erythema present. No oropharyngeal exudate or posterior oropharyngeal edema.  Eyes: Conjunctivae are normal. Pupils are equal, round, and reactive to light. Right eye exhibits no discharge. Left eye exhibits no discharge. No scleral icterus.  Neck: Normal range of motion. Neck supple. No tracheal deviation present. No thyromegaly present.  Cardiovascular: Normal rate, regular rhythm and normal heart sounds.  Exam reveals no gallop and no friction rub.   No murmur heard. Pulmonary/Chest: Effort normal and breath sounds normal. No stridor. No respiratory distress. She has no wheezes. She has no rales.  Lymphadenopathy:    She has no cervical adenopathy.  Skin: Skin is warm and dry. She is not diaphoretic.  Vitals reviewed.      Assessment & Plan:     1. Upper respiratory tract infection, unspecified type Worsening and has not responded to OTC medications. Will give Zpak as below. She is to push fluids. Discussed using salt water gargles and chloraseptic spray for throat pain. Her husband reports they have a cough syrup at home already that is helping. Advised of drowsiness precautions with this since I do not know what they have at home. Advised to use Mucinex DM or Delsym for daytime cough. Call if no improvements. - azithromycin (ZITHROMAX) 250 MG tablet; Take 2 tablets PO on day one, and one tablet PO daily  thereafter until completed.  Dispense: 6 tablet; Refill: 0  2. Pharyngitis, unspecified etiology See above medical treatment plan.       Mar Daring, PA-C  [facility]

## 2016-10-14 ENCOUNTER — Ambulatory Visit (INDEPENDENT_AMBULATORY_CARE_PROVIDER_SITE_OTHER): Payer: PPO | Admitting: Family Medicine

## 2016-10-14 ENCOUNTER — Encounter: Payer: Self-pay | Admitting: Family Medicine

## 2016-10-14 VITALS — BP 128/94 | HR 70 | Temp 99.2°F | Resp 16 | Wt 243.0 lb

## 2016-10-14 DIAGNOSIS — J069 Acute upper respiratory infection, unspecified: Secondary | ICD-10-CM | POA: Diagnosis not present

## 2016-10-14 DIAGNOSIS — Z0000 Encounter for general adult medical examination without abnormal findings: Secondary | ICD-10-CM

## 2016-10-14 MED ORDER — AZITHROMYCIN 250 MG PO TABS: ORAL_TABLET | ORAL | 0 refills | Status: DC

## 2016-10-14 NOTE — Progress Notes (Signed)
Patient: Kayla Chan, Female    DOB: [birth year], 83 y.o.   MRN: [id number] Visit Date: 10/14/2016  Today's Provider: Lelon Huh, MD   Chief Complaint  Patient presents with  . Annual Exam  . Hypertension  . Hyperlipidemia  . Hyperglycemia   Subjective:    Annual physical exam Kayla Chan is a 83 y.o. female who presents today for health maintenance and complete physical. She feels fairly well. She reports never exercising. She reports she is sleeping fairly well.  -----------------------------------------------------------------  Hypertension, follow-up:  BP Readings from Last 3 Encounters:  10/10/16 (!) 144/80  08/19/16 134/68  07/13/16 110/80    She was last seen for hypertension 3 months ago.  BP at that visit was 110/80. Management since that visit includes no changes. She reports good compliance with treatment. She is not having side effects.  She is not exercising. She is adherent to low salt diet.   Outside blood pressures are 120/69. She is experiencing none.  Patient denies chest pain, chest pressure/discomfort, claudication, dyspnea, exertional chest pressure/discomfort, fatigue, irregular heart beat, lower extremity edema, near-syncope, orthopnea, palpitations, paroxysmal nocturnal dyspnea, syncope and tachypnea.   Cardiovascular risk factors include advanced age (older than 3 for men, 84 for women), hypertension and obesity (BMI >= 30 kg/m2).  Use of agents associated with hypertension: NSAIDS.     Weight trend: stable Wt Readings from Last 3 Encounters:  10/10/16 243 lb (110.2 kg)  08/19/16 246 lb 8 oz (111.8 kg)  07/13/16 245 lb (111.1 kg)    Current diet: well balanced  ------------------------------------------------------------------------  Prediabetes, Follow-up:   Lab Results  Component Value Date   HGBA1C 5.8 (H) 07/15/2016   HGBA1C 5.7 01/08/2016   HGBA1C 6.2 (H) 07/02/2015   GLUCOSE 101 (H) 07/15/2016   GLUCOSE 98  02/04/2016   GLUCOSE 78 08/20/2015    Last seen for for this 3 months ago.  Management since that visit includes no changes. Current symptoms include none and have been stable.  Weight trend: stable Prior visit with dietician: no Current diet: well balanced Current exercise: none  Pertinent Labs:    Component Value Date/Time   CHOL 231 (H) 07/15/2016 1012   TRIG 226 (H) 07/15/2016 1012   CHOLHDL 4.5 (H) 07/15/2016 1012   CREATININE 0.97 07/15/2016 1012   CREATININE 1.10 08/05/2013 1238    Wt Readings from Last 3 Encounters:  10/10/16 243 lb (110.2 kg)  08/19/16 246 lb 8 oz (111.8 kg)  07/13/16 245 lb (111.1 kg)    Lipid/Cholesterol, Follow-up:   Last seen for this 3 months ago.  Management changes since that visit include none. . Last Lipid Panel:    Component Value Date/Time   CHOL 231 (H) 07/15/2016 1012   TRIG 226 (H) 07/15/2016 1012   HDL 51 07/15/2016 1012   CHOLHDL 4.5 (H) 07/15/2016 1012   LDLCALC 135 (H) 07/15/2016 1012    Risk factors for vascular disease include hypercholesterolemia and hypertension  She reports good compliance with treatment. She is not having side effects.  Current symptoms include none and have been stable. Weight trend: stable Prior visit with dietician: no Current diet: in general, an "unhealthy" diet Current exercise: none  Wt Readings from Last 3 Encounters:  10/10/16 243 lb (110.2 kg)  08/19/16 246 lb 8 oz (111.8 kg)  07/13/16 245 lb (111.1 kg)    -------------------------------------------------------------------   Review of Systems  Constitutional: Negative for chills, fatigue and fever.  HENT: Negative for congestion,  ear pain, rhinorrhea, sneezing and sore throat.   Eyes: Negative.  Negative for pain and redness.  Respiratory: Negative for cough (productive with yellow phlegm in the mornings), shortness of breath and wheezing.   Cardiovascular: Negative for chest pain and leg swelling.  Gastrointestinal:  Negative for abdominal pain, blood in stool, constipation, diarrhea and nausea.  Endocrine: Negative for polydipsia and polyphagia.  Genitourinary: Negative.  Negative for dysuria, flank pain, hematuria, pelvic pain, vaginal bleeding and vaginal discharge.  Musculoskeletal: Negative for arthralgias, back pain, gait problem and joint swelling.  Skin: Negative for rash.  Neurological: Negative.  Negative for dizziness, tremors, seizures, weakness, light-headedness, numbness and headaches.  Hematological: Negative for adenopathy.  Psychiatric/Behavioral: Negative.  Negative for behavioral problems, confusion and dysphoric mood. The patient is not nervous/anxious and is not hyperactive.     Social History      She  reports that she has never smoked. She has never used smokeless tobacco. She reports that she does not drink alcohol or use drugs.       Social History   Social History  . Marital status: Married    Spouse name: N/A  . Number of children: 0  . Years of education: N/A   Occupational History  . Retired    Social History Main Topics  . Smoking status: Never Smoker  . Smokeless tobacco: Never Used  . Alcohol use No  . Drug use: No  . Sexual activity: Not Asked   Other Topics Concern  . None   Social History Narrative  . None    Past Medical History:  Diagnosis Date  . Cancer ([place])    [place] on nose     Patient Active Problem List   Diagnosis Date Noted  . Fatigue 02/04/2016  . Palpitations 02/04/2016  . Gout 09/24/2015  . Hyperuricemia 08/20/2015  . Back pain 06/28/2015  . Constipation 06/28/2015  . Eczema 06/28/2015  . Edema 06/28/2015  . Pre-diabetes 06/28/2015  . Intertrigo 06/28/2015  . PAC (premature atrial contraction) 06/28/2015  . Premature heartbeats 06/28/2015  . History of skin cancer 02/18/2015  . Fatty infiltration of liver 05/31/2013  . Hypothyroidism 01/15/2011  . Arthropathy of pelvic region and thigh 08/01/2008  . HLD (hyperlipidemia)  03/13/2008  . Arthritis, degenerative 01/26/2007  . Hypertension 12/13/2006  . Personal history of venous thrombosis and embolism 05/24/2006    Past Surgical History:  Procedure Laterality Date  . 24 hour Holter Monitor  01/2013   4550 ectopic beats with 344 superventricular bigemminy events  . Abdominal Ultrasound  05/31/2013   RUQ. Fatty Liver  . APPENDECTOMY  1950   [place]  . 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, [place]; Right    Family History        Family Status  Relation Status  . Mother Alive  . Father Deceased  . Sister Deceased  . Brother Deceased  . Sister Alive        Her family history includes Bladder Cancer in her brother; Heart Problems in her father; Kidney cancer in her sister; Stroke in her mother.     Allergies  Allergen Reactions  . Atorvastatin     Other reaction(s): Muscle Pain  . Colestid  [Colestipol Hcl]     Itching, insomnia  . Crestor  [Rosuvastatin Calcium]     Other reaction(s): Muscle Pain  . Doxycycline   . Fluvastatin Sodium     Other reaction(s): Muscle Pain  . Furosemide  Severe Cramping  . Penicillins   . Pravastatin Sodium     Numbness in legs     Current Outpatient Prescriptions:  .  azithromycin (ZITHROMAX) 250 MG tablet, Take 2 tablets PO on day one, and one tablet PO daily thereafter until completed., Disp: 6 tablet, Rfl: 0 .  diclofenac (CATAFLAM) 50 MG tablet, TAKE 1 TABLET (50 MG TOTAL) BY MOUTH 2 (TWO) TIMES DAILY AS NEEDED (FOOT PAIN)., Disp: 30 tablet, Rfl: 2 .  ezetimibe (ZETIA) 10 MG tablet, TAKE 1 TABLET BY MOUTH IN THE MORNING, Disp: 30 tablet, Rfl: 11 .  levothyroxine (SYNTHROID, LEVOTHROID) 75 MCG tablet, TAKE 1 TABLET BY MOUTH EVERY DAY, Disp: 90 tablet, Rfl: 4 .  lisinopril (PRINIVIL,ZESTRIL) 10 MG tablet, TAKE 1 TABLET (10 MG TOTAL) BY MOUTH DAILY., Disp: 90 tablet, Rfl: 4 .  mometasone (ELOCON) 0.1 % ointment, APPLY 1 APPLICATION TOPICALLY DAILY AS NEEDED. APPLY TO  AFFECTED AREA, Disp: 15 g, Rfl: 5 .  Multiple Vitamins-Minerals (MULTIVITAMIN ADULT PO), Take 1 tablet by mouth daily., Disp: , Rfl:  .  senna (SENOKOT) 8.6 MG tablet, Take 1 tablet by mouth daily., Disp: , Rfl:  .  traMADol (ULTRAM) 50 MG tablet, Take 1-2 tablets every eight hours as need for foot pain, Disp: 60 tablet, Rfl: 1 .  verapamil (VERELAN PM) 120 MG 24 hr capsule, TAKE 1 CAPSULE EVERY EVENING, Disp: 30 capsule, Rfl: 12 .  aspirin 81 MG tablet, Take 81 mg by mouth daily., Disp: , Rfl:    Patient Care Team: Birdie Sons, MD as PCP - General (Family Medicine) Ronnell Freshwater, MD as Referring Physician (Ophthalmology) Regino Schultze, MD as Referring Physician (Dermatology)      Objective:   Vitals: BP (!) 128/94 (BP Location: Left Arm, Patient Position: Sitting, Cuff Size: Large)   Pulse 70   Temp 99.2 F (37.3 C) (Oral)   Resp 16   Wt 243 lb (110.2 kg)   SpO2 97%   BMI 49.08 kg/m    Physical Exam   General Appearance:    Alert, cooperative, no distress, appears stated age, obese  Head:    Normocephalic, without obvious abnormality, atraumatic  Eyes:    PERRL, conjunctiva/corneas clear, EOM's intact, fundi    benign, both eyes  Ears:    Normal TM's and external ear canals, both ears  Nose:   Nares normal, septum midline, mucosa normal, no drainage    or sinus tenderness  Throat:   Lips, mucosa, and tongue normal; teeth and gums normal  Neck:   Supple, symmetrical, trachea midline, no adenopathy;    thyroid:  no enlargement/tenderness/nodules; no carotid   bruit or JVD  Back:     Symmetric, no curvature, ROM normal, no CVA tenderness  Lungs:     Clear to auscultation bilaterally, respirations unlabored  Chest Wall:    No tenderness or deformity   Heart:    Regular rate and rhythm, S1 and S2 normal, no murmur, rub   or gallop  Breast Exam:    deferred  Abdomen:     Soft, non-tender, bowel sounds active all four quadrants,    no masses, no organomegaly    Pelvic:    deferred  Extremities:   Extremities normal, atraumatic, no cyanosis or edema  Pulses:   2+ and symmetric all extremities  Skin:   Skin color, texture, turgor normal, no rashes or lesions  Lymph nodes:   Cervical, supraclavicular, and axillary nodes normal  Neurologic:   CNII-XII  intact, normal strength, sensation and reflexes    throughout    Depression Screen PHQ 2/9 Scores 08/19/2016 07/13/2016 07/01/2015  PHQ - 2 Score 0 0 0  PHQ- 9 Score - 0 -      Assessment & Plan:     Routine Health Maintenance and Physical Exam  Exercise Activities and Dietary recommendations Goals    . Increase water intake          Starting 08/19/16, I will continue to drink 5 glasses of water a day.       Immunization History  Administered Date(s) Administered  . Influenza, High Dose Seasonal PF 05/31/2015, 05/30/2016  . Pneumococcal Conjugate-13 05/09/2014  . Pneumococcal Polysaccharide-23 05/24/2006    Health Maintenance  Topic Date Due  . TETANUS/TDAP  05/07/1953  . DEXA SCAN  08/19/2026 (Originally 05/08/1999)  . INFLUENZA VACCINE  Completed  . PNA vac Low Risk Adult  Completed     Discussed health benefits of physical activity, and encouraged her to engage in regular exercise appropriate for her age and condition.    -------------------------------------------------------------------- 1. Annual physical exam Generally doing well. Up to date on health maintenance and labs for chronic medical conditions.    2. Upper respiratory tract infection, unspecified type Improving, but not resolved. Refilled azithromycin today.  - azithromycin (ZITHROMAX) 250 MG tablet; Take 2 tablets PO on day one, and one tablet PO daily thereafter until completed.  Dispense: 6 tablet; Refill: 0  Return in about 6 months (around 04/13/2017).   Lelon Huh, MD  [facility]

## 2016-11-05 DIAGNOSIS — L578 Other skin changes due to chronic exposure to nonionizing radiation: Secondary | ICD-10-CM | POA: Diagnosis not present

## 2016-11-05 DIAGNOSIS — C44311 Basal cell carcinoma of skin of nose: Secondary | ICD-10-CM | POA: Diagnosis not present

## 2016-11-05 DIAGNOSIS — L908 Other atrophic disorders of skin: Secondary | ICD-10-CM | POA: Diagnosis not present

## 2016-11-05 DIAGNOSIS — M950 Acquired deformity of nose: Secondary | ICD-10-CM | POA: Diagnosis not present

## 2016-11-05 DIAGNOSIS — L814 Other melanin hyperpigmentation: Secondary | ICD-10-CM | POA: Diagnosis not present

## 2016-11-09 ENCOUNTER — Ambulatory Visit (INDEPENDENT_AMBULATORY_CARE_PROVIDER_SITE_OTHER): Payer: PPO | Admitting: Family Medicine

## 2016-11-09 ENCOUNTER — Encounter: Payer: Self-pay | Admitting: Family Medicine

## 2016-11-09 VITALS — BP 122/68 | Temp 98.1°F | Resp 16 | Wt 239.0 lb

## 2016-11-09 DIAGNOSIS — J069 Acute upper respiratory infection, unspecified: Secondary | ICD-10-CM

## 2016-11-09 DIAGNOSIS — Z85828 Personal history of other malignant neoplasm of skin: Secondary | ICD-10-CM | POA: Diagnosis not present

## 2016-11-09 MED ORDER — AZITHROMYCIN 250 MG PO TABS: ORAL_TABLET | ORAL | 0 refills | Status: AC

## 2016-11-09 NOTE — Progress Notes (Signed)
Patient: Kayla Chan Female    DOB: 08-12-1932   83 y.o.   MRN: [id number] Visit Date: 11/09/2016  Today's Provider: Lelon Huh, MD   Chief Complaint  Patient presents with  . URI   Subjective:    URI   This is a new problem. The problem has been unchanged. There has been no fever. Associated symptoms include congestion, coughing, headaches and sinus pain. She has tried decongestant for the symptoms. The treatment provided mild relief.   She was seen on 10/10/2016 for similar symptoms and treated with a Zpak. She states that she felt better within the first few days and symptoms resolved for a few weeks She had BCC excised from nose 11/05/16 and URI symptoms got much worse the next day.     Allergies  Allergen Reactions  . Atorvastatin     Other reaction(s): Muscle Pain  . Colestid  [Colestipol Hcl]     Itching, insomnia  . Crestor  [Rosuvastatin Calcium]     Other reaction(s): Muscle Pain  . Doxycycline   . Fluvastatin Sodium     Other reaction(s): Muscle Pain  . Furosemide     Severe Cramping  . Penicillins   . Pravastatin Sodium     Numbness in legs     Current Outpatient Prescriptions:  .  aspirin 81 MG tablet, Take 81 mg by mouth daily., Disp: , Rfl:  .  diclofenac (CATAFLAM) 50 MG tablet, TAKE 1 TABLET (50 MG TOTAL) BY MOUTH 2 (TWO) TIMES DAILY AS NEEDED (FOOT PAIN)., Disp: 30 tablet, Rfl: 2 .  ezetimibe (ZETIA) 10 MG tablet, TAKE 1 TABLET BY MOUTH IN THE MORNING, Disp: 30 tablet, Rfl: 11 .  levothyroxine (SYNTHROID, LEVOTHROID) 75 MCG tablet, TAKE 1 TABLET BY MOUTH EVERY DAY, Disp: 90 tablet, Rfl: 4 .  lisinopril (PRINIVIL,ZESTRIL) 10 MG tablet, TAKE 1 TABLET (10 MG TOTAL) BY MOUTH DAILY., Disp: 90 tablet, Rfl: 4 .  mometasone (ELOCON) 0.1 % ointment, APPLY 1 APPLICATION TOPICALLY DAILY AS NEEDED. APPLY TO AFFECTED AREA, Disp: 15 g, Rfl: 5 .  Multiple Vitamins-Minerals (MULTIVITAMIN ADULT PO), Take 1 tablet by mouth daily., Disp: , Rfl:  .  senna  (SENOKOT) 8.6 MG tablet, Take 1 tablet by mouth daily., Disp: , Rfl:  .  traMADol (ULTRAM) 50 MG tablet, Take 1-2 tablets every eight hours as need for foot pain, Disp: 60 tablet, Rfl: 1 .  verapamil (VERELAN PM) 120 MG 24 hr capsule, TAKE 1 CAPSULE EVERY EVENING, Disp: 30 capsule, Rfl: 12 .  azithromycin (ZITHROMAX) 250 MG tablet, Take 2 tablets PO on day one, and one tablet PO daily thereafter until completed. (Patient not taking: Reported on 11/09/2016), Disp: 6 tablet, Rfl: 0  Review of Systems  HENT: Positive for congestion and sinus pain.   Respiratory: Positive for cough.   Neurological: Positive for headaches.    Social History  Substance Use Topics  . Smoking status: Never Smoker  . Smokeless tobacco: Never Used  . Alcohol use No   Objective:   BP 122/68 (BP Location: Left Arm, Patient Position: Sitting, Cuff Size: Large)   Temp 98.1 F (36.7 C)   Resp 16   Wt 239 lb (108.4 kg)   BMI 48.27 kg/m  Vitals:   11/09/16 1558  BP: 122/68  Resp: 16  Temp: 98.1 F (36.7 C)  Weight: 239 lb (108.4 kg)     Physical Exam  General Appearance:    Alert, cooperative, no distress  HENT:  bilateral TM normal without fluid or infection, neck without nodes and sinuses nontender Unable to examine nasal passages due to surgical bandages.   Eyes:    PERRL, conjunctiva/corneas clear, EOM's intact       Lungs:     Clear to auscultation bilaterally, respirations unlabored  Heart:    Regular rate and rhythm  Neurologic:   Awake, alert, oriented x 3. No apparent focal neurological           defect.           Assessment & Plan:     1. Upper respiratory tract infection, unspecified type  - azithromycin (ZITHROMAX) 250 MG tablet; 2 by mouth today, then 1 daily for 4 days  Dispense: 6 tablet; Refill: 0 Call if symptoms change or if not rapidly improving.     2. History of basal cell cancer        Lelon Huh, MD  [facility]

## 2016-11-16 ENCOUNTER — Other Ambulatory Visit: Payer: Self-pay | Admitting: Family Medicine

## 2016-11-26 ENCOUNTER — Telehealth: Payer: Self-pay | Admitting: Family Medicine

## 2016-11-26 NOTE — Telephone Encounter (Signed)
Pt states she is have side effects from her medication.  Pt is having dry mouth, cough and sometimes having double vision.  Pt feels like this is from the Rx verapamil (VERELAN PM) 120 MG 24 hr capsule.  Please advise.  CB#[phone]/MW

## 2016-11-26 NOTE — Telephone Encounter (Signed)
Left message to call back  

## 2016-11-26 NOTE — Telephone Encounter (Signed)
She can stop to see if symptoms resolve but recommend her following up with Dr. Caryn Section to have her BP rechecked to see if something else needs to be added in its place. She should f/u in 2 weeks or so for BP check.

## 2016-12-01 NOTE — Telephone Encounter (Signed)
Advised  ED 

## 2016-12-21 ENCOUNTER — Other Ambulatory Visit: Payer: Self-pay | Admitting: Family Medicine

## 2017-01-12 ENCOUNTER — Ambulatory Visit (INDEPENDENT_AMBULATORY_CARE_PROVIDER_SITE_OTHER): Payer: PPO | Admitting: Family Medicine

## 2017-01-12 ENCOUNTER — Encounter: Payer: Self-pay | Admitting: Family Medicine

## 2017-01-12 VITALS — BP 110/70 | HR 86 | Temp 98.0°F | Resp 16 | Ht 59.0 in | Wt 245.0 lb

## 2017-01-12 DIAGNOSIS — E039 Hypothyroidism, unspecified: Secondary | ICD-10-CM | POA: Diagnosis not present

## 2017-01-12 DIAGNOSIS — I10 Essential (primary) hypertension: Secondary | ICD-10-CM | POA: Diagnosis not present

## 2017-01-12 DIAGNOSIS — E785 Hyperlipidemia, unspecified: Secondary | ICD-10-CM | POA: Diagnosis not present

## 2017-01-12 DIAGNOSIS — R7303 Prediabetes: Secondary | ICD-10-CM | POA: Diagnosis not present

## 2017-01-12 MED ORDER — LISINOPRIL 5 MG PO TABS: 5.0000 mg | ORAL_TABLET | Freq: Every day | ORAL | 2 refills | Status: DC

## 2017-01-12 NOTE — Progress Notes (Signed)
Patient: Kayla Chan Female    DOB: Apr 08, 1932   83 y.o.   MRN: [id number] Visit Date: 01/12/2017  Today's Provider: Lelon Huh, MD   Chief Complaint  Patient presents with  . Follow-up  . Hypertension  . Hyperlipidemia  . Hypothyroidism   Subjective:    HPI   Hypertension, follow-up:  BP Readings from Last 3 Encounters:  01/12/17 110/70  11/09/16 122/68  10/14/16 (!) 128/94    She was last seen for hypertension 3 months ago.  BP at that visit was 128/94. Management since that visit includes; no changes.She reports good compliance with treatment. She is not having side effects. none She some exercising. She is adherent to low salt diet.   Outside blood pressures are 126/56. She is experiencing none.  Patient denies none.   Cardiovascular risk factors include none.  Use of agents associated with hypertension: none.   ----------------------------------------------------------------    Lipid/Cholesterol, Follow-up:   Last seen for this 6 months ago.  Management since that visit includes; no changes.  Last Lipid Panel:    Component Value Date/Time   CHOL 231 (H) 07/15/2016 1012   TRIG 226 (H) 07/15/2016 1012   HDL 51 07/15/2016 1012   CHOLHDL 4.5 (H) 07/15/2016 1012   LDLCALC 135 (H) 07/15/2016 1012    She reports good compliance with treatment. She is not having side effects. none  Wt Readings from Last 3 Encounters:  01/12/17 245 lb (111.1 kg)  11/09/16 239 lb (108.4 kg)  10/14/16 243 lb (110.2 kg)    ----------------------------------------------------------------  Pre-diabetes From 07/13/2016- labs checked, no changes. Hemoglobin A1c 5.8.  Hypothyroidism, unspecified hypothyroidism type From 02/04/2016-labs checked, no changes.     Allergies  Allergen Reactions  . Atorvastatin     Other reaction(s): Muscle Pain  . Colestid  [Colestipol Hcl]     Itching, insomnia  . Crestor  [Rosuvastatin Calcium]     Other reaction(s):  Muscle Pain  . Doxycycline   . Fluvastatin Sodium     Other reaction(s): Muscle Pain  . Furosemide     Severe Cramping  . Penicillins   . Pravastatin Sodium     Numbness in legs     Current Outpatient Prescriptions:  .  aspirin 81 MG tablet, Take 81 mg by mouth daily., Disp: , Rfl:  .  diclofenac (CATAFLAM) 50 MG tablet, TAKE 1 TABLET (50 MG TOTAL) BY MOUTH 2 (TWO) TIMES DAILY AS NEEDED (FOOT PAIN)., Disp: 30 tablet, Rfl: 2 .  ezetimibe (ZETIA) 10 MG tablet, TAKE 1 TABLET BY MOUTH IN THE MORNING, Disp: 30 tablet, Rfl: 12 .  levothyroxine (SYNTHROID, LEVOTHROID) 75 MCG tablet, TAKE 1 TABLET BY MOUTH EVERY DAY, Disp: 90 tablet, Rfl: 4 .  lisinopril (PRINIVIL,ZESTRIL) 10 MG tablet, TAKE 1 TABLET (10 MG TOTAL) BY MOUTH DAILY., Disp: 90 tablet, Rfl: 4 .  mometasone (ELOCON) 0.1 % ointment, APPLY 1 APPLICATION TOPICALLY DAILY AS NEEDED. APPLY TO AFFECTED AREA, Disp: 15 g, Rfl: 5 .  Multiple Vitamins-Minerals (MULTIVITAMIN ADULT PO), Take 1 tablet by mouth daily., Disp: , Rfl:  .  senna (SENOKOT) 8.6 MG tablet, Take 1 tablet by mouth daily., Disp: , Rfl:  .  traMADol (ULTRAM) 50 MG tablet, Take 1-2 tablets every eight hours as need for foot pain, Disp: 60 tablet, Rfl: 1 .  verapamil (VERELAN PM) 120 MG 24 hr capsule, TAKE 1 CAPSULE EVERY EVENING, Disp: 30 capsule, Rfl: 11  Review of Systems  Constitutional: Negative for appetite  change, chills, fatigue and fever.  Respiratory: Negative for chest tightness and shortness of breath.   Cardiovascular: Negative for chest pain and palpitations.  Gastrointestinal: Negative for abdominal pain, nausea and vomiting.  Neurological: Positive for dizziness. Negative for weakness.    Social History  Substance Use Topics  . Smoking status: Never Smoker  . Smokeless tobacco: Never Used  . Alcohol use No   Objective:   BP 110/70 (BP Location: Left Arm, Patient Position: Sitting, Cuff Size: Normal)   Pulse 86   Temp 98 F (36.7 C) (Oral)   Resp 16    Ht 4\' 11"  (1.499 m)   Wt 245 lb (111.1 kg)   SpO2 99%   BMI 49.48 kg/m  Vitals:   01/12/17 1122  BP: 110/70  Pulse: 86  Resp: 16  Temp: 98 F (36.7 C)  TempSrc: Oral  SpO2: 99%  Weight: 245 lb (111.1 kg)  Height: 4\' 11"  (1.499 m)     Physical Exam  General Appearance:    Alert, cooperative, no distress, obese  Eyes:    PERRL, conjunctiva/corneas clear, EOM's intact       Lungs:     Clear to auscultation bilaterally, respirations unlabored  Heart:    Regular rate and rhythm  Neurologic:   Awake, alert, oriented x 3. No apparent focal neurological           defect.           Assessment & Plan:      1. Pre-diabetes  - Hemoglobin A1c  2. Hypothyroidism, unspecified type  - T4 AND TSH  3. Essential hypertension She has been feeling a little weak in the morning after taking lisinopril. Will decrease to 5mg  .  - Renal function panel - lisinopril (PRINIVIL,ZESTRIL) 5 MG tablet; Take 1 tablet (5 mg total) by mouth daily.  Dispense: 90 tablet; Refill: 2  4. Hyperlipidemia, unspecified hyperlipidemia type  - Lipid panel       Lelon Huh, MD  [facility]

## 2017-01-13 DIAGNOSIS — E039 Hypothyroidism, unspecified: Secondary | ICD-10-CM | POA: Diagnosis not present

## 2017-01-13 DIAGNOSIS — I10 Essential (primary) hypertension: Secondary | ICD-10-CM | POA: Diagnosis not present

## 2017-01-13 DIAGNOSIS — R7303 Prediabetes: Secondary | ICD-10-CM | POA: Diagnosis not present

## 2017-01-13 DIAGNOSIS — E785 Hyperlipidemia, unspecified: Secondary | ICD-10-CM | POA: Diagnosis not present

## 2017-01-14 LAB — LIPID PANEL
Chol/HDL Ratio: 4.2 ratio (ref 0.0–4.4)
Cholesterol, Total: 212 mg/dL — ABNORMAL HIGH (ref 100–199)
HDL: 50 mg/dL (ref >39)
LDL CALC: 122 mg/dL — AB (ref 0–99)
Triglycerides: 201 mg/dL — ABNORMAL HIGH (ref 0–149)
VLDL CHOLESTEROL CAL: 40 mg/dL (ref 5–40)

## 2017-01-14 LAB — RENAL FUNCTION PANEL
Albumin: 3.7 g/dL (ref 3.5–4.7)
BUN / CREAT RATIO: 26 (ref 12–28)
BUN: 24 mg/dL (ref 8–27)
CO2: 22 mmol/L (ref 18–29)
CREATININE: 0.94 mg/dL (ref 0.57–1.00)
Calcium: 9.5 mg/dL (ref 8.7–10.3)
Chloride: 105 mmol/L (ref 96–106)
GFR calc Af Amer: 65 mL/min/{1.73_m2} (ref >59)
GFR, EST NON AFRICAN AMERICAN: 57 mL/min/{1.73_m2} — AB (ref >59)
Glucose: 96 mg/dL (ref 65–99)
Phosphorus: 3.5 mg/dL (ref 2.5–4.5)
Potassium: 4.7 mmol/L (ref 3.5–5.2)
SODIUM: 140 mmol/L (ref 134–144)

## 2017-01-14 LAB — T4 AND TSH
T4, Total: 9.4 ug/dL (ref 4.5–12.0)
TSH: 2.06 u[IU]/mL (ref 0.450–4.500)

## 2017-01-14 LAB — HEMOGLOBIN A1C
ESTIMATED AVERAGE GLUCOSE: 123 mg/dL
HEMOGLOBIN A1C: 5.9 % — AB (ref 4.8–5.6)

## 2017-02-11 ENCOUNTER — Other Ambulatory Visit: Payer: Self-pay | Admitting: Family Medicine

## 2017-02-11 DIAGNOSIS — I10 Essential (primary) hypertension: Secondary | ICD-10-CM

## 2017-03-01 ENCOUNTER — Encounter: Payer: Self-pay | Admitting: Family Medicine

## 2017-03-01 ENCOUNTER — Ambulatory Visit (INDEPENDENT_AMBULATORY_CARE_PROVIDER_SITE_OTHER): Payer: PPO | Admitting: Family Medicine

## 2017-03-01 ENCOUNTER — Ambulatory Visit: Payer: PPO | Admitting: Physician Assistant

## 2017-03-01 VITALS — BP 144/76 | HR 86 | Temp 98.6°F | Resp 16 | Wt 245.0 lb

## 2017-03-01 DIAGNOSIS — N3001 Acute cystitis with hematuria: Secondary | ICD-10-CM

## 2017-03-01 DIAGNOSIS — I10 Essential (primary) hypertension: Secondary | ICD-10-CM

## 2017-03-01 LAB — POCT URINALYSIS DIPSTICK
BILIRUBIN UA: NEGATIVE
GLUCOSE UA: NEGATIVE
Ketones, UA: NEGATIVE
Nitrite, UA: NEGATIVE
PH UA: 6.5 (ref 5.0–8.0)
Protein, UA: NEGATIVE
Spec Grav, UA: >=1.03 — AB (ref 1.010–1.025)
Urobilinogen, UA: 0.2 E.U./dL

## 2017-03-01 MED ORDER — VERAPAMIL HCL ER 180 MG PO CP24: ORAL_CAPSULE | ORAL | 3 refills | Status: DC

## 2017-03-01 MED ORDER — CIPROFLOXACIN HCL 500 MG PO TABS: 500.0000 mg | ORAL_TABLET | Freq: Two times a day (BID) | ORAL | 0 refills | Status: AC

## 2017-03-01 NOTE — Progress Notes (Signed)
Patient: Kayla Chan Female    DOB: 22-Oct-1931   83 y.o.   MRN: [id number] Visit Date: 03/01/2017  Today's Provider: Lelon Huh, MD   Chief Complaint  Patient presents with  . Abdominal Pain   Subjective:    Abdominal Pain  This is a new problem. The current episode started in the past 7 days. The onset quality is gradual. The problem occurs intermittently. The problem has been gradually improving. The pain is located in the LLQ. The pain is mild. The quality of the pain is aching and cramping. The abdominal pain does not radiate. Associated symptoms include frequency and nausea. Pertinent negatives include no dysuria, fever or hematuria. Nothing aggravates the pain. The pain is relieved by nothing. She has tried nothing for the symptoms.    She also report BP has been running a little higher since reducing lisinopril to 5mg   Due to dizziness. She tries taking another 5mg  from time to time, but the dizziness gets much worse.     Allergies  Allergen Reactions  . Atorvastatin     Other reaction(s): Muscle Pain  . Colestid  [Colestipol Hcl]     Itching, insomnia  . Crestor  [Rosuvastatin Calcium]     Other reaction(s): Muscle Pain  . Doxycycline   . Fluvastatin Sodium     Other reaction(s): Muscle Pain  . Furosemide     Severe Cramping  . Penicillins   . Pravastatin Sodium     Numbness in legs     Current Outpatient Prescriptions:  .  aspirin 81 MG tablet, Take 81 mg by mouth daily., Disp: , Rfl:  .  diclofenac (CATAFLAM) 50 MG tablet, TAKE 1 TABLET (50 MG TOTAL) BY MOUTH 2 (TWO) TIMES DAILY AS NEEDED (FOOT PAIN)., Disp: 30 tablet, Rfl: 2 .  ezetimibe (ZETIA) 10 MG tablet, TAKE 1 TABLET BY MOUTH IN THE MORNING, Disp: 30 tablet, Rfl: 12 .  levothyroxine (SYNTHROID, LEVOTHROID) 75 MCG tablet, TAKE 1 TABLET BY MOUTH EVERY DAY, Disp: 90 tablet, Rfl: 4 .  lisinopril (PRINIVIL,ZESTRIL) 10 MG tablet, TAKE 1 TABLET (10 MG TOTAL) BY MOUTH DAILY., Disp: 90 tablet, Rfl: 4 .   mometasone (ELOCON) 0.1 % ointment, APPLY 1 APPLICATION TOPICALLY DAILY AS NEEDED. APPLY TO AFFECTED AREA, Disp: 15 g, Rfl: 5 .  Multiple Vitamins-Minerals (MULTIVITAMIN ADULT PO), Take 1 tablet by mouth daily., Disp: , Rfl:  .  senna (SENOKOT) 8.6 MG tablet, Take 1 tablet by mouth daily., Disp: , Rfl:  .  traMADol (ULTRAM) 50 MG tablet, Take 1-2 tablets every eight hours as need for foot pain, Disp: 60 tablet, Rfl: 1 .  verapamil (VERELAN PM) 120 MG 24 hr capsule, TAKE 1 CAPSULE EVERY EVENING, Disp: 30 capsule, Rfl: 11 .  lisinopril (PRINIVIL,ZESTRIL) 5 MG tablet, Take 1 tablet (5 mg total) by mouth daily., Disp: 90 tablet, Rfl: 2  Review of Systems  Constitutional: Negative for fever.  Gastrointestinal: Positive for abdominal pain and nausea.  Genitourinary: Positive for frequency. Negative for dysuria and hematuria.    Social History  Substance Use Topics  . Smoking status: Never Smoker  . Smokeless tobacco: Never Used  . Alcohol use No   Objective:   BP (!) 144/76 (BP Location: Right Wrist, Patient Position: Sitting, Cuff Size: Normal)   Pulse 86   Temp 98.6 F (37 C)   Resp 16   Wt 245 lb (111.1 kg)   SpO2 98%   BMI 49.48 kg/m  Vitals:  03/01/17 1557  BP: (!) 144/76  Pulse: 86  Resp: 16  Temp: 98.6 F (37 C)  SpO2: 98%  Weight: 245 lb (111.1 kg)     Physical Exam  General Appearance:    Alert, cooperative, no distress  Eyes:    PERRL, conjunctiva/corneas clear, EOM's intact       Lungs:     Clear to auscultation bilaterally, respirations unlabored  Heart:    Regular rate and rhythm  Abdomen:   Slight tenderness left supra pubic area      Results for orders placed or performed in visit on 03/01/17  POCT urinalysis dipstick  Result Value Ref Range   Color, UA yellow    Clarity, UA cloudy    Glucose, UA negative    Bilirubin, UA negative    Ketones, UA negative    Spec Grav, UA >=1.030 (A) 1.010 - 1.025   Blood, UA hemolyzed trace    pH, UA 6.5 5.0 -  8.0   Protein, UA negative    Urobilinogen, UA 0.2 0.2 or 1.0 E.U./dL   Nitrite, UA negative    Leukocytes, UA Small (1+) (A) Negative       Assessment & Plan:     1. Acute cystitis with hematuria  - POCT urinalysis dipstick - Urine Culture - ciprofloxacin (CIPRO) 500 MG tablet; Take 1 tablet (500 mg total) by mouth 2 (two) times daily.  Dispense: 14 tablet; Refill: 0  2. Essential hypertension Worse since reducing dose of lisinopril. Will Increase verapamil dosage for better BP control. She is to call if dizziness worsens, otherwise follow up for BP check in a month.  - verapamil (VERELAN PM) 180 MG 24 hr capsule; TAKE 1 CAPSULE EVERY EVENING  Dispense: 30 capsule; Refill: 3       Lelon Huh, MD  [facility]

## 2017-03-03 LAB — URINE CULTURE

## 2017-03-30 ENCOUNTER — Ambulatory Visit (INDEPENDENT_AMBULATORY_CARE_PROVIDER_SITE_OTHER): Payer: PPO | Admitting: Family Medicine

## 2017-03-30 ENCOUNTER — Encounter: Payer: Self-pay | Admitting: Family Medicine

## 2017-03-30 VITALS — BP 138/70 | HR 76 | Temp 98.6°F | Resp 18 | Ht 59.0 in | Wt 248.0 lb

## 2017-03-30 DIAGNOSIS — M79604 Pain in right leg: Secondary | ICD-10-CM | POA: Diagnosis not present

## 2017-03-30 DIAGNOSIS — N3090 Cystitis, unspecified without hematuria: Secondary | ICD-10-CM | POA: Diagnosis not present

## 2017-03-30 DIAGNOSIS — I10 Essential (primary) hypertension: Secondary | ICD-10-CM | POA: Diagnosis not present

## 2017-03-30 LAB — POCT URINALYSIS DIPSTICK
BILIRUBIN UA: NEGATIVE
GLUCOSE UA: NEGATIVE
Ketones, UA: NEGATIVE
Leukocytes, UA: NEGATIVE
Nitrite, UA: NEGATIVE
Protein, UA: NEGATIVE
SPEC GRAV UA: 1.025 (ref 1.010–1.025)
Urobilinogen, UA: 0.2 E.U./dL
pH, UA: 5 (ref 5.0–8.0)

## 2017-03-30 NOTE — Patient Instructions (Addendum)
   Take verapamil at bedtime.

## 2017-03-30 NOTE — Progress Notes (Signed)
Patient: Kayla Chan Female    DOB: 1932/03/14   83 y.o.   MRN: [id number] Visit Date: 03/30/2017  Today's Provider: Lelon Huh, MD   Chief Complaint  Patient presents with  . Hypertension    1 month follow up  . Cystitis    recheck   Subjective:    HPI  Hypertension, follow-up:  BP Readings from Last 3 Encounters:  03/01/17 (!) 144/76  01/12/17 110/70  11/09/16 122/68    She was last seen for hypertension 1 months ago.  BP at that visit was 144/76. Management since that visit includes increasing Verapamil dose to 180mg  daily. She reports good compliance with treatment. She is not having side effects.  She is not exercising. She is adherent to low salt diet.   Outside blood pressures are 159/70. She is experiencing none.  Patient denies chest pain, chest pressure/discomfort, claudication, dyspnea, exertional chest pressure/discomfort, fatigue, irregular heart beat, lower extremity edema, near-syncope, orthopnea, palpitations, paroxysmal nocturnal dyspnea, syncope and tachypnea.   Cardiovascular risk factors include advanced age (older than 55 for men, 39 for women) and hypertension.  Use of agents associated with hypertension: NSAIDS.     Weight trend: fluctuating a bit Wt Readings from Last 3 Encounters:  03/01/17 245 lb (111.1 kg)  01/12/17 245 lb (111.1 kg)  11/09/16 239 lb (108.4 kg)    Current diet: well balanced  ------------------------------------------------------------------------ Follow up of Cystitis:  Patient was last seen for this problem 1 month ago. Patient was treated with Cipro. Patient reports good compliance with treatment and good tolerance. Patient request that her urine be checked to make sure infection has resolved.     Allergies  Allergen Reactions  . Atorvastatin     Other reaction(s): Muscle Pain  . Colestid  [Colestipol Hcl]     Itching, insomnia  . Crestor  [Rosuvastatin Calcium]     Other reaction(s): Muscle Pain    . Doxycycline   . Fluvastatin Sodium     Other reaction(s): Muscle Pain  . Furosemide     Severe Cramping  . Penicillins   . Pravastatin Sodium     Numbness in legs     Current Outpatient Prescriptions:  .  aspirin 81 MG tablet, Take 81 mg by mouth daily., Disp: , Rfl:  .  diclofenac (CATAFLAM) 50 MG tablet, TAKE 1 TABLET (50 MG TOTAL) BY MOUTH 2 (TWO) TIMES DAILY AS NEEDED (FOOT PAIN)., Disp: 30 tablet, Rfl: 2 .  ezetimibe (ZETIA) 10 MG tablet, TAKE 1 TABLET BY MOUTH IN THE MORNING, Disp: 30 tablet, Rfl: 12 .  levothyroxine (SYNTHROID, LEVOTHROID) 75 MCG tablet, TAKE 1 TABLET BY MOUTH EVERY DAY, Disp: 90 tablet, Rfl: 4 .  lisinopril (PRINIVIL,ZESTRIL) 5 MG tablet, Take 1 tablet (5 mg total) by mouth daily., Disp: 90 tablet, Rfl: 2 .  mometasone (ELOCON) 0.1 % ointment, APPLY 1 APPLICATION TOPICALLY DAILY AS NEEDED. APPLY TO AFFECTED AREA, Disp: 15 g, Rfl: 5 .  Multiple Vitamins-Minerals (MULTIVITAMIN ADULT PO), Take 1 tablet by mouth daily., Disp: , Rfl:  .  senna (SENOKOT) 8.6 MG tablet, Take 1 tablet by mouth daily., Disp: , Rfl:  .  traMADol (ULTRAM) 50 MG tablet, Take 1-2 tablets every eight hours as need for foot pain, Disp: 60 tablet, Rfl: 1 .  verapamil (VERELAN PM) 180 MG 24 hr capsule, TAKE 1 CAPSULE EVERY EVENING, Disp: 30 capsule, Rfl: 3  Review of Systems  Constitutional: Negative for appetite change, chills, fatigue and fever.  Respiratory: Negative for chest tightness and shortness of breath.   Cardiovascular: Negative for chest pain and palpitations.  Gastrointestinal: Negative for abdominal pain, nausea and vomiting.  Neurological: Positive for dizziness (in the mornings and before bed time) and light-headedness. Negative for weakness.    Social History  Substance Use Topics  . Smoking status: Never Smoker  . Smokeless tobacco: Never Used  . Alcohol use No   Objective:    Vitals:   03/30/17 1351 03/30/17 1403  BP: (!) 144/72 138/70  Pulse: 76   Resp: 18    Temp: 98.6 F (37 C)   TempSrc: Oral   SpO2: 96%   Weight: 248 lb (112.5 kg)   Height: 4\' 11"  (1.499 m)      Physical Exam  General Appearance:    Alert, cooperative, no distress, obese  Eyes:    PERRL, conjunctiva/corneas clear, EOM's intact       Lungs:     Clear to auscultation bilaterally, respirations unlabored  Heart:    Regular rate and rhythm  Neurologic:   Awake, alert, oriented x 3. No apparent focal neurological           defect.       Results for orders placed or performed in visit on 03/30/17  POCT Urinalysis Dipstick  Result Value Ref Range   Color, UA yellow    Clarity, UA clear    Glucose, UA negative    Bilirubin, UA negative    Ketones, UA negative    Spec Grav, UA 1.025 1.010 - 1.025   Blood, UA Trace (Non hemolyzed)    pH, UA 5.0 5.0 - 8.0   Protein, UA negative    Urobilinogen, UA 0.2 0.2 or 1.0 E.U./dL   Nitrite, UA negative    Leukocytes, UA Negative Negative       Assessment & Plan:     1. Essential hypertension Improved with increase dose of verapamil.   2. Cystitis resolved - POCT Urinalysis Dipstick  3. Leg pain, anterior, right Sh requests referral to orthopedist for further evaluation.  - Ambulatory referral to Orthopedic Surgery       Lelon Huh, MD  [place]

## 2017-04-20 ENCOUNTER — Other Ambulatory Visit: Payer: Self-pay | Admitting: Family Medicine

## 2017-04-20 MED ORDER — CYCLOBENZAPRINE HCL 5 MG PO TABS: 5.0000 mg | ORAL_TABLET | Freq: Three times a day (TID) | ORAL | 1 refills | Status: DC | PRN

## 2017-05-12 DIAGNOSIS — Z85828 Personal history of other malignant neoplasm of skin: Secondary | ICD-10-CM | POA: Diagnosis not present

## 2017-05-12 DIAGNOSIS — L570 Actinic keratosis: Secondary | ICD-10-CM | POA: Diagnosis not present

## 2017-05-14 ENCOUNTER — Other Ambulatory Visit: Payer: Self-pay | Admitting: Family Medicine

## 2017-05-14 MED ORDER — FUROSEMIDE 20 MG PO TABS: 20.0000 mg | ORAL_TABLET | Freq: Every day | ORAL | 2 refills | Status: DC | PRN

## 2017-05-14 NOTE — Progress Notes (Signed)
Patient here with husband today complains of more swelling in feet then usual, requesting fluid pill. No dyspnea, fever, or palpitations. Sent prescription furosemide and to make appointment if not responding to diuretic.

## 2017-05-31 ENCOUNTER — Other Ambulatory Visit: Payer: Self-pay | Admitting: Family Medicine

## 2017-05-31 DIAGNOSIS — I10 Essential (primary) hypertension: Secondary | ICD-10-CM

## 2017-05-31 NOTE — Telephone Encounter (Signed)
Requesting 90 day supply.

## 2017-05-31 NOTE — Telephone Encounter (Signed)
[facility] faxed a request for a 90-days supply for the following medication. Thanks CC  verapamil (VERELAN PM) 180 MG 24 hr capsule  >Take one capsule by mouth in the evening.

## 2017-06-01 MED ORDER — VERAPAMIL HCL ER 180 MG PO CP24: ORAL_CAPSULE | ORAL | 4 refills | Status: DC

## 2017-06-02 DIAGNOSIS — M25561 Pain in right knee: Secondary | ICD-10-CM | POA: Diagnosis not present

## 2017-06-07 ENCOUNTER — Other Ambulatory Visit: Payer: Self-pay | Admitting: Family Medicine

## 2017-06-07 MED ORDER — FUROSEMIDE 20 MG PO TABS: 20.0000 mg | ORAL_TABLET | Freq: Every day | ORAL | 5 refills | Status: DC | PRN

## 2017-06-07 NOTE — Telephone Encounter (Signed)
Pt contacted office for refill request on the following medications:  furosemide (LASIX) 20 MG tablet.  Take 1 tablet by mouth daily as needed for edema.  90 day supply.  [facility] [address]

## 2017-06-09 ENCOUNTER — Telehealth: Payer: Self-pay

## 2017-06-09 NOTE — Telephone Encounter (Signed)
Patient's husband came to the office at 4:45 to ask for an appointment for the wife tomorrow.  He stated that they thought she was having and allergic reaction to one of her medications.  When asked what kind of reaction he said her feet would swell and she had some difficulty breathing.  He was asked if she was with him and he said she was in the car.  When advised that she needed to be seen by someone he said she was not having the reaction now it is just at night when she goes to bed.  When asked if he knew which medication she thought was causing it he said her Verapamil.  He further stated that she did not take it last night and her symptoms were better and she did not have any trouble with her heart rate or blood pressure.  He was advised to continue the same tonight and discuss it with Dr Caryn Section at the appointment tomorrow.  Dr Caryn Section was notified of this advise and was in agreement.  The patient is to be seen tomorrow.

## 2017-06-10 ENCOUNTER — Encounter: Payer: Self-pay | Admitting: Family Medicine

## 2017-06-10 ENCOUNTER — Ambulatory Visit (INDEPENDENT_AMBULATORY_CARE_PROVIDER_SITE_OTHER): Payer: PPO | Admitting: Family Medicine

## 2017-06-10 VITALS — BP 140/78 | HR 84 | Temp 98.2°F | Resp 16 | Wt 250.0 lb

## 2017-06-10 DIAGNOSIS — R609 Edema, unspecified: Secondary | ICD-10-CM | POA: Diagnosis not present

## 2017-06-10 DIAGNOSIS — I10 Essential (primary) hypertension: Secondary | ICD-10-CM

## 2017-06-10 DIAGNOSIS — R42 Dizziness and giddiness: Secondary | ICD-10-CM | POA: Diagnosis not present

## 2017-06-10 DIAGNOSIS — Z23 Encounter for immunization: Secondary | ICD-10-CM | POA: Diagnosis not present

## 2017-06-10 MED ORDER — LISINOPRIL 5 MG PO TABS: 5.0000 mg | ORAL_TABLET | Freq: Two times a day (BID) | ORAL | 2 refills | Status: DC

## 2017-06-10 NOTE — Progress Notes (Signed)
Patient: Kayla Chan Female    DOB: 01/12/1932   83 y.o.   MRN: [id number] Visit Date: 06/10/2017  Today's Provider: Lelon Huh, MD   Chief Complaint  Patient presents with  . Medication Reaction   Subjective:    Patient states that for the past few weeks she has had blurry vision with sleepy every time she took Verapamil. Patient stated tat while taking medication she had symptoms of sob, dizziness, swelling in feet and fatigue. Patient stopped verapamil 2 days ago and symptoms have improved.  she states she has had more swelling, some dyspnea and dizziness ever since dose of verapamil was incresaed, but it has been worse over the last week which is why she stopped medication two days ago, she also reports she stopped furosemide two days ago, but swelling has improved.     Allergies  Allergen Reactions  . Atorvastatin     Other reaction(s): Muscle Pain  . Colestid  [Colestipol Hcl]     Itching, insomnia  . Crestor  [Rosuvastatin Calcium]     Other reaction(s): Muscle Pain  . Doxycycline   . Fluvastatin Sodium     Other reaction(s): Muscle Pain  . Furosemide     Severe Cramping  . Penicillins   . Pravastatin Sodium     Numbness in legs     Current Outpatient Prescriptions:  .  aspirin 81 MG tablet, Take 81 mg by mouth daily., Disp: , Rfl:  .  ezetimibe (ZETIA) 10 MG tablet, TAKE 1 TABLET BY MOUTH IN THE MORNING, Disp: 30 tablet, Rfl: 12 .  furosemide (LASIX) 20 MG tablet, Take 1 tablet (20 mg total) by mouth daily as needed for edema., Disp: 30 tablet, Rfl: 5 .  levothyroxine (SYNTHROID, LEVOTHROID) 75 MCG tablet, TAKE 1 TABLET BY MOUTH EVERY DAY, Disp: 90 tablet, Rfl: 4 .  lisinopril (PRINIVIL,ZESTRIL) 5 MG tablet, Take 1 tablet (5 mg total) by mouth 2 (two) times daily., Disp: 180 tablet, Rfl: 2 .  mometasone (ELOCON) 0.1 % ointment, APPLY 1 APPLICATION TOPICALLY DAILY AS NEEDED. APPLY TO AFFECTED AREA, Disp: 15 g, Rfl: 5 .  Multiple Vitamins-Minerals  (MULTIVITAMIN ADULT PO), Take 1 tablet by mouth daily., Disp: , Rfl:  .  senna (SENOKOT) 8.6 MG tablet, Take 1 tablet by mouth daily., Disp: , Rfl:  .  cyclobenzaprine (FLEXERIL) 5 MG tablet, Take 1 tablet (5 mg total) by mouth 3 (three) times daily as needed for muscle spasms. (Patient not taking: Reported on 06/10/2017), Disp: 30 tablet, Rfl: 1 .  diclofenac (CATAFLAM) 50 MG tablet, TAKE 1 TABLET (50 MG TOTAL) BY MOUTH 2 (TWO) TIMES DAILY AS NEEDED (FOOT PAIN). (Patient not taking: Reported on 06/10/2017), Disp: 30 tablet, Rfl: 2 .  traMADol (ULTRAM) 50 MG tablet, Take 1-2 tablets every eight hours as need for foot pain (Patient not taking: Reported on 06/10/2017), Disp: 60 tablet, Rfl: 1  Review of Systems  Constitutional: Negative for appetite change, chills, fatigue and fever.  Respiratory: Positive for shortness of breath. Negative for chest tightness.   Cardiovascular: Positive for leg swelling. Negative for chest pain and palpitations.  Gastrointestinal: Negative for abdominal pain, nausea and vomiting.  Neurological: Negative for dizziness and weakness.    Social History  Substance Use Topics  . Smoking status: Never Smoker  . Smokeless tobacco: Never Used  . Alcohol use No   Objective:   BP 140/78 (BP Location: Left Arm, Patient Position: Sitting, Cuff Size: Normal)   Pulse  84   Temp 98.2 F (36.8 C) (Oral)   Resp 16   Wt 250 lb (113.4 kg)   SpO2 99%   BMI 50.49 kg/m  Vitals:   06/10/17 1118  BP: 140/78  Pulse: 84  Resp: 16  Temp: 98.2 F (36.8 C)  TempSrc: Oral  SpO2: 99%  Weight: 250 lb (113.4 kg)     Physical Exam  General Appearance:    Alert, cooperative, no distress, obese  Eyes:    PERRL, conjunctiva/corneas clear, EOM's intact       Lungs:     Clear to auscultation bilaterally, respirations unlabored  Heart:    Regular rate and rhythm, 2+ bipedal edema.   Neurologic:   Awake, alert, oriented x 3. No apparent focal neurological           defect.            Assessment & Plan:     1. Essential hypertension She has stopped verapamil as she feels it is causing side effects as below. Will double lisinopril and recheck BP in about 4 weeks. Call if sx return.  - lisinopril (PRINIVIL,ZESTRIL) 5 MG tablet; Take 1 tablet (5 mg total) by mouth 2 (two) times daily.  Dispense: 180 tablet; Refill: 2  2. Dizziness Resolved since stopping verapamil.   3. Need for influenza vaccination  - Flu vaccine HIGH DOSE PF  4. Edema, unspecified type Improved off verapamil, despite also stopping furosemide. Recommend she get back on furosemide and will recheck in 4 weeks. Call if swelling worsens at all.        Lelon Huh, MD  [facility]

## 2017-06-17 ENCOUNTER — Telehealth: Payer: Self-pay | Admitting: Family Medicine

## 2017-06-17 NOTE — Telephone Encounter (Signed)
LMOVM for pt to return call 

## 2017-06-17 NOTE — Telephone Encounter (Signed)
-----   Message from Birdie Sons, MD sent at 06/10/2017 12:23 PM EDT ----- Regarding: call and make sure dyspnea resolves off verapamil Check labs if not.

## 2017-06-17 NOTE — Telephone Encounter (Signed)
Please check with patient to make sure breathing problems have resolved since stopping verapamil. If so then follow up for BP check in November as scheduled. If not then need to get labs done tomorrow.

## 2017-06-18 NOTE — Telephone Encounter (Signed)
Pt is returning call.  CB#[phone]/[place]

## 2017-06-22 NOTE — Telephone Encounter (Signed)
Patient reports that her breathing is much better off of verapamil. She also reports that she no longer has any swelling. She is tolerating Lisinopril well.

## 2017-07-06 ENCOUNTER — Ambulatory Visit: Payer: Self-pay | Admitting: Family Medicine

## 2017-07-08 ENCOUNTER — Telehealth: Payer: Self-pay | Admitting: Family Medicine

## 2017-07-12 ENCOUNTER — Ambulatory Visit: Payer: PPO | Admitting: Family Medicine

## 2017-07-12 ENCOUNTER — Encounter: Payer: Self-pay | Admitting: Family Medicine

## 2017-07-12 VITALS — BP 150/90 | HR 85 | Temp 98.1°F | Resp 16 | Ht 59.0 in | Wt 247.0 lb

## 2017-07-12 DIAGNOSIS — L309 Dermatitis, unspecified: Secondary | ICD-10-CM

## 2017-07-12 DIAGNOSIS — M25579 Pain in unspecified ankle and joints of unspecified foot: Secondary | ICD-10-CM

## 2017-07-12 DIAGNOSIS — G8929 Other chronic pain: Secondary | ICD-10-CM | POA: Diagnosis not present

## 2017-07-12 DIAGNOSIS — I10 Essential (primary) hypertension: Secondary | ICD-10-CM

## 2017-07-12 MED ORDER — MOMETASONE FUROATE 0.1 % EX OINT: 1.0000 "application " | TOPICAL_OINTMENT | Freq: Every day | CUTANEOUS | 5 refills | Status: DC | PRN

## 2017-07-12 MED ORDER — LISINOPRIL-HYDROCHLOROTHIAZIDE 10-12.5 MG PO TABS: 1.0000 | ORAL_TABLET | Freq: Every day | ORAL | 3 refills | Status: DC

## 2017-07-12 MED ORDER — DICLOFENAC POTASSIUM 50 MG PO TABS
ORAL_TABLET | ORAL | 2 refills | Status: DC
Start: 2017-07-12 — End: 2017-12-13

## 2017-07-12 NOTE — Progress Notes (Signed)
Patient: Kayla Chan Female    DOB: [birth year]   83 y.o.   MRN: [id number] Visit Date: 07/12/2017  Today's Provider: Lelon Huh, MD   Chief Complaint  Patient presents with  . Follow-up  . Hypertension  . Hyperlipidemia   Subjective:    HPI   Hypertension, follow-up:  BP Readings from Last 3 Encounters:  07/12/17 (!) 150/90  06/10/17 140/78  03/30/17 138/70    She was last seen for hypertension 6 months ago.  BP at that visit was 140/78. Management since that visit includes; discontinued verapamil due to side effects. Doubled dose of lisinopril.She reports good compliance with treatment. States she feels much better since stopping verapamil. Home BP has been in the 130s and 140s. Leg swelling is much bette, no sensation of difficulty breathing.  She is not having side effects. none She is not exercising. She is not adherent to low salt diet.   Outside blood pressures are 150/80. She is experiencing none.  Patient denies none.   Cardiovascular risk factors include none.  Use of agents associated with hypertension: none.   ----------------------------------------------------------------   Lipid/Cholesterol, Follow-up:   Last seen for this 6 months ago.  Management since that visit includes; labs checked, cholesterol a little high. Advised to continue current medications.  Last Lipid Panel:    Component Value Date/Time   CHOL 212 (H) 01/13/2017 1040   TRIG 201 (H) 01/13/2017 1040   HDL 50 01/13/2017 1040   CHOLHDL 4.2 01/13/2017 1040   LDLCALC 122 (H) 01/13/2017 1040    She reports good compliance with treatment. She is not having side effects. none  Wt Readings from Last 3 Encounters:  07/12/17 247 lb (112 kg)  06/10/17 250 lb (113.4 kg)  03/30/17 248 lb (112.5 kg)    ----------------------------------------------------------------  Edema, unspecified type From 06/10/2017-advised to restarted furosemide and reports that swelling has  greatly improved.    she also states she has been having pain in her ankle again. Was treated with diclofenac in the past which she feels worked well. No recent known injury.   She also requests refill for Elocon cream which she uses occasionally and finds to be effective.    Allergies  Allergen Reactions  . Atorvastatin     Other reaction(s): Muscle Pain  . Colestid  [Colestipol Hcl]     Itching, insomnia  . Crestor  [Rosuvastatin Calcium]     Other reaction(s): Muscle Pain  . Doxycycline   . Fluvastatin Sodium     Other reaction(s): Muscle Pain  . Furosemide     Severe Cramping  . Penicillins   . Pravastatin Sodium     Numbness in legs  . Verapamil     Shortness of breath, swelling, palpations.      Current Outpatient Medications:  .  aspirin 81 MG tablet, Take 81 mg by mouth daily., Disp: , Rfl:  .  ezetimibe (ZETIA) 10 MG tablet, TAKE 1 TABLET BY MOUTH IN THE MORNING, Disp: 30 tablet, Rfl: 12 .  furosemide (LASIX) 20 MG tablet, Take 1 tablet (20 mg total) by mouth daily as needed for edema., Disp: 30 tablet, Rfl: 5 .  levothyroxine (SYNTHROID, LEVOTHROID) 75 MCG tablet, TAKE 1 TABLET BY MOUTH EVERY DAY, Disp: 90 tablet, Rfl: 4 .  lisinopril (PRINIVIL,ZESTRIL) 5 MG tablet, Take 1 tablet (5 mg total) by mouth 2 (two) times daily., Disp: 180 tablet, Rfl: 2 .  mometasone (ELOCON) 0.1 % ointment, APPLY 1 APPLICATION TOPICALLY  DAILY AS NEEDED. APPLY TO AFFECTED AREA, Disp: 15 g, Rfl: 5 .  Multiple Vitamins-Minerals (MULTIVITAMIN ADULT PO), Take 1 tablet by mouth daily., Disp: , Rfl:  .  senna (SENOKOT) 8.6 MG tablet, Take 1 tablet by mouth daily., Disp: , Rfl:  .  cyclobenzaprine (FLEXERIL) 5 MG tablet, Take 1 tablet (5 mg total) by mouth 3 (three) times daily as needed for muscle spasms. (Patient not taking: Reported on 07/12/2017), Disp: 30 tablet, Rfl: 1 .  diclofenac (CATAFLAM) 50 MG tablet, TAKE 1 TABLET (50 MG TOTAL) BY MOUTH 2 (TWO) TIMES DAILY AS NEEDED (FOOT PAIN).  (Patient not taking: Reported on 07/12/2017), Disp: 30 tablet, Rfl: 2 .  traMADol (ULTRAM) 50 MG tablet, Take 1-2 tablets every eight hours as need for foot pain (Patient not taking: Reported on 07/12/2017), Disp: 60 tablet, Rfl: 1  Review of Systems  Constitutional: Negative for appetite change, chills, fatigue and fever.  Respiratory: Negative for chest tightness and shortness of breath.   Cardiovascular: Negative for chest pain and palpitations.  Gastrointestinal: Negative for abdominal pain, nausea and vomiting.  Neurological: Negative for dizziness and weakness.    Social History   Tobacco Use  . Smoking status: Never Smoker  . Smokeless tobacco: Never Used  Substance Use Topics  . Alcohol use: No    Alcohol/week: 0.0 oz   Objective:   BP (!) 150/90 (BP Location: Left Arm, Patient Position: Sitting, Cuff Size: Normal)   Pulse 85   Temp 98.1 F (36.7 C) (Oral)   Resp 16   Ht 4\' 11"  (1.499 m)   Wt 247 lb (112 kg)   SpO2 95%   BMI 49.89 kg/m  Vitals:   07/12/17 1444  BP: (!) 150/90  Pulse: 85  Resp: 16  Temp: 98.1 F (36.7 C)  TempSrc: Oral  SpO2: 95%  Weight: 247 lb (112 kg)  Height: 4\' 11"  (1.499 m)     Physical Exam  General Appearance:    Alert, cooperative, no distress, obese  Eyes:    PERRL, conjunctiva/corneas clear, EOM's intact       Lungs:     Clear to auscultation bilaterally, respirations unlabored  Heart:    Regular rate and rhythm  MS:   Slightly swollen ankle with mild tenderness around both malleoli.          Assessment & Plan:     1. Essential hypertension Tolerating lisinopril much better than CCB, but BP not well controlled. Change to lisinopril-hctz.  - lisinopril-hydrochlorothiazide (PRINZIDE,ZESTORETIC) 10-12.5 MG tablet; Take 1 tablet daily by mouth.  Dispense: 30 tablet; Refill: 3  2. Chronic ankle pain, unspecified laterality Did well with diclofenac in the past.  - diclofenac (CATAFLAM) 50 MG tablet; TAKE 1 TABLET (50 MG  TOTAL) BY MOUTH 2 (TWO) TIMES DAILY AS NEEDED (FOOT PAIN).  Dispense: 30 tablet; Refill: 2  3. Eczema, unspecified type Refill mometasone (ELOCON) 0.1 % ointment; Apply 1 application daily as needed topically. Apply to affected area  Dispense: 15 g; Refill: 5  Return for BP check in a month. Anticipated checking A1c and renal panel at that time.       Lelon Huh, MD  [facility]

## 2017-07-19 ENCOUNTER — Telehealth: Payer: Self-pay | Admitting: Family Medicine

## 2017-07-19 NOTE — Telephone Encounter (Signed)
Please advise 

## 2017-07-19 NOTE — Telephone Encounter (Signed)
Patient states that you put her on lisinopril-hydrochlorothiazide (PRINZIDE,ZESTORETIC) 10-12.5 MG tablet and it is making her very dizzy.  She states that she cannot even walk.  Legs feel like muscles are very tight.  She is also feeling shaky and a little bit nauseous at times. Her BP was  129/64 this morning.  She wants to know if she should keep taking it or if you want to adjust this medication.  Please advise.  Patient states to leave message if she is not available.  [facility] uses [facility].

## 2017-07-20 NOTE — Telephone Encounter (Signed)
Skip the medication for two days, if dizziness goes away we will need to find a different medication,

## 2017-07-20 NOTE — Telephone Encounter (Signed)
Patient was advised. Patient had some lisinopril 5 mg that she took this morning. Patient said that the dizziness had subsided somewhat.

## 2017-07-22 NOTE — Telephone Encounter (Signed)
Please see if she is still doing well on lisinopril 5mg , if so then can start taking 1/2 tablet a day of lisinopril-hctz 10-12.5 for better blood pressure control.

## 2017-07-23 NOTE — Telephone Encounter (Signed)
I called the patient to give her the new instructions.  She had me a little confused because she kept saying she wasn't taking anything.  Patient said she only took the 5 mg pill one time so she doesn't really know how she will do on it.  I instructed her to take the 5 mg daily and call us on Monday to tell us how she did and then get further instructions.

## 2017-07-27 ENCOUNTER — Telehealth: Payer: Self-pay | Admitting: Family Medicine

## 2017-07-27 NOTE — Telephone Encounter (Signed)
Pt calling back with her BP reading with the one BP medication she is currently taking.  Pt states it is currently staying around 137/63.  Pt states you can call her if you have questions.

## 2017-07-27 NOTE — Telephone Encounter (Signed)
Please review. Thanks!  

## 2017-07-28 NOTE — Telephone Encounter (Signed)
Pt called back to get an update on the message that was sent yesterday. Pt stated that if you get her voicemail when you return her call to please leave a detailed message. Please advise. Thanks TNP

## 2017-07-28 NOTE — Telephone Encounter (Signed)
If she is tolerating the 5mg  lisinopril then try increasing to 2 x 5mg  tablets a day until her follow up appointment later this month.

## 2017-07-28 NOTE — Telephone Encounter (Signed)
Left detailed message on patient's VM as below.

## 2017-08-06 ENCOUNTER — Encounter: Payer: Self-pay | Admitting: Family Medicine

## 2017-08-06 ENCOUNTER — Ambulatory Visit: Payer: PPO | Admitting: Family Medicine

## 2017-08-06 VITALS — BP 120/90 | HR 64 | Temp 98.0°F | Resp 16 | Wt 245.0 lb

## 2017-08-06 DIAGNOSIS — K2970 Gastritis, unspecified, without bleeding: Secondary | ICD-10-CM | POA: Diagnosis not present

## 2017-08-06 DIAGNOSIS — I10 Essential (primary) hypertension: Secondary | ICD-10-CM | POA: Diagnosis not present

## 2017-08-06 MED ORDER — RANITIDINE HCL 300 MG PO CAPS: 300.0000 mg | ORAL_CAPSULE | Freq: Every day | ORAL | 0 refills | Status: DC | PRN

## 2017-08-06 MED ORDER — AMLODIPINE BESYLATE 2.5 MG PO TABS: ORAL_TABLET | ORAL | 1 refills | Status: DC

## 2017-08-06 MED ORDER — LISINOPRIL 5 MG PO TABS: 5.0000 mg | ORAL_TABLET | Freq: Every day | ORAL | 1 refills | Status: DC

## 2017-08-06 NOTE — Progress Notes (Signed)
Patient: Kayla Chan Female    DOB: 1931-11-25   83 y.o.   MRN: [id number] Visit Date: 08/06/2017  Today's Provider: Lelon Huh, MD   Chief Complaint  Patient presents with  . Follow-up  . Hypertension   Subjective:    HPI   Hypertension, follow-up:  BP Readings from Last 3 Encounters:  08/06/17 120/90  07/12/17 (!) 150/90  06/10/17 140/78    She was last seen for hypertension 1 months ago.  BP at that visit was 150/90. Management since that visit includes; changed to [place]. Since last office visit, stopped lisinopril-HCTZ due to patient intolerance. However she started back on 1/2 tablet qd. On 07/27/2017 she was advised to try increasing to 2 5 mg tablets qd until her next ov .She reports good compliance with treatment. She is having side effects. Dizziness, which seems to occur after taking second lisinopril in the evening.  She is not exercising. She is not adherent to low salt diet.   Outside blood pressures are 140/80. She is experiencing none.  Patient denies none.   Cardiovascular risk factors include advanced age (older than 71 for men, 81 for women).  Use of agents associated with hypertension: none.   ----------------------------------------------------------------    Also states was prescribed pantoprazole by Dr. Allen Norris for stomach ulcer a few years ago, but she states it makes her feet itch. Has had a few episodes of left upper quadrant pain which resolved with pantoprazole.  Allergies  Allergen Reactions  . Atorvastatin     Other reaction(s): Muscle Pain  . Colestid  [Colestipol Hcl]     Itching, insomnia  . Crestor  [Rosuvastatin Calcium]     Other reaction(s): Muscle Pain  . Doxycycline   . Fluvastatin Sodium     Other reaction(s): Muscle Pain  . Furosemide     Severe Cramping  . Penicillins   . Pravastatin Sodium     Numbness in legs  . Verapamil     Shortness of breath, swelling, palpations.      Current Outpatient  Medications:  .  aspirin 81 MG tablet, Take 81 mg by mouth daily., Disp: , Rfl:  .  diclofenac (CATAFLAM) 50 MG tablet, TAKE 1 TABLET (50 MG TOTAL) BY MOUTH 2 (TWO) TIMES DAILY AS NEEDED (FOOT PAIN)., Disp: 30 tablet, Rfl: 2 .  ezetimibe (ZETIA) 10 MG tablet, TAKE 1 TABLET BY MOUTH IN THE MORNING, Disp: 30 tablet, Rfl: 12 .  furosemide (LASIX) 20 MG tablet, Take 1 tablet (20 mg total) by mouth daily as needed for edema., Disp: 30 tablet, Rfl: 5 .  levothyroxine (SYNTHROID, LEVOTHROID) 75 MCG tablet, TAKE 1 TABLET BY MOUTH EVERY DAY, Disp: 90 tablet, Rfl: 4 .  mometasone (ELOCON) 0.1 % ointment, Apply 1 application daily as needed topically. Apply to affected area, Disp: 15 g, Rfl: 5 .  Multiple Vitamins-Minerals (MULTIVITAMIN ADULT PO), Take 1 tablet by mouth daily., Disp: , Rfl:  .  senna (SENOKOT) 8.6 MG tablet, Take 1 tablet by mouth daily., Disp: , Rfl:  .  lisinopril (PRINIVIL,ZESTRIL) 5 MG tablet, Take 5 mg by mouth 2 (two) times daily., Disp: , Rfl: 2  Review of Systems  Constitutional: Negative for appetite change, chills, fatigue and fever.  Respiratory: Negative for chest tightness and shortness of breath.   Cardiovascular: Negative for chest pain and palpitations.  Gastrointestinal: Negative for abdominal pain, nausea and vomiting.  Neurological: Negative for dizziness and weakness.    Social History  Tobacco Use  . Smoking status: Never Smoker  . Smokeless tobacco: Never Used  Substance Use Topics  . Alcohol use: No    Alcohol/week: 0.0 oz   Objective:   BP 120/90 (BP Location: Right Arm, Patient Position: Sitting, Cuff Size: Normal)   Pulse 64   Temp 98 F (36.7 C) (Oral)   Resp 16   Wt 245 lb (111.1 kg)   SpO2 99%   BMI 49.48 kg/m  Vitals:   08/06/17 1146  BP: 120/90  Pulse: 64  Resp: 16  Temp: 98 F (36.7 C)  TempSrc: Oral  SpO2: 99%  Weight: 245 lb (111.1 kg)     Physical Exam  General appearance: alert, well developed, well nourished, cooperative  and in no distress Head: Normocephalic, without obvious abnormality, atraumatic Respiratory: Respirations even and unlabored, normal respiratory rate Extremities: No gross deformities Skin: Skin color, texture, turgor normal. No rashes seen  Psych: Appropriate mood and affect. Neurologic: Mental status: Alert, oriented to person, place, and time, thought content appropriate.     Assessment & Plan:     1. Gastritis without bleeding, unspecified chronicity, unspecified gastritis type Responds well to pantoprazole, but it causes itching. Only has occasional sx. Will try - ranitidine (ZANTAC) 300 MG capsule; Take 1 capsule (300 mg total) by mouth daily as needed (stomach pain).  Dispense: 30 capsule; Refill: 0  2. Essential hypertension Well controlled, but has some dizziness after taking the second dose of lisinopril in the evening. Will continue morning dose and add amlodipine in the evening.  - amLODipine (NORVASC) 2.5 MG tablet; Take one tablet every evening  Dispense: 30 tablet; Refill: 1 - lisinopril (PRINIVIL,ZESTRIL) 5 MG tablet; Take 1 tablet (5 mg total) by mouth daily.  Dispense: 1 tablet; Refill: 1  Follow up bp check in 1 month       Lelon Huh, MD  [facility]

## 2017-08-11 ENCOUNTER — Ambulatory Visit: Payer: Self-pay | Admitting: Family Medicine

## 2017-08-25 ENCOUNTER — Ambulatory Visit: Payer: Self-pay

## 2017-08-30 ENCOUNTER — Other Ambulatory Visit: Payer: Self-pay | Admitting: Family Medicine

## 2017-08-30 DIAGNOSIS — K2970 Gastritis, unspecified, without bleeding: Secondary | ICD-10-CM

## 2017-08-30 MED ORDER — RANITIDINE HCL 300 MG PO CAPS: 300.0000 mg | ORAL_CAPSULE | Freq: Every day | ORAL | 3 refills | Status: DC | PRN

## 2017-08-30 NOTE — Telephone Encounter (Signed)
[facility] pharmacy faxed a refill request for a 90-days supply for the following medication. Thanks CC  ranitidine (ZANTAC) 300 MG capsule

## 2017-08-30 NOTE — Telephone Encounter (Signed)
Please review. Thanks!  

## 2017-09-03 ENCOUNTER — Ambulatory Visit (INDEPENDENT_AMBULATORY_CARE_PROVIDER_SITE_OTHER): Payer: PPO | Admitting: Family Medicine

## 2017-09-03 ENCOUNTER — Ambulatory Visit (INDEPENDENT_AMBULATORY_CARE_PROVIDER_SITE_OTHER): Payer: PPO

## 2017-09-03 ENCOUNTER — Ambulatory Visit: Payer: Self-pay | Admitting: Family Medicine

## 2017-09-03 VITALS — BP 162/88 | HR 72 | Temp 97.6°F | Ht 59.0 in | Wt 245.8 lb

## 2017-09-03 VITALS — BP 140/80

## 2017-09-03 DIAGNOSIS — R2689 Other abnormalities of gait and mobility: Secondary | ICD-10-CM | POA: Diagnosis not present

## 2017-09-03 DIAGNOSIS — Z6841 Body Mass Index (BMI) 40.0 and over, adult: Secondary | ICD-10-CM

## 2017-09-03 DIAGNOSIS — E2839 Other primary ovarian failure: Secondary | ICD-10-CM

## 2017-09-03 DIAGNOSIS — Z7409 Other reduced mobility: Secondary | ICD-10-CM | POA: Insufficient documentation

## 2017-09-03 DIAGNOSIS — G8929 Other chronic pain: Secondary | ICD-10-CM | POA: Diagnosis not present

## 2017-09-03 DIAGNOSIS — R7303 Prediabetes: Secondary | ICD-10-CM

## 2017-09-03 DIAGNOSIS — I10 Essential (primary) hypertension: Secondary | ICD-10-CM

## 2017-09-03 DIAGNOSIS — E039 Hypothyroidism, unspecified: Secondary | ICD-10-CM

## 2017-09-03 DIAGNOSIS — Z0000 Encounter for general adult medical examination without abnormal findings: Secondary | ICD-10-CM

## 2017-09-03 DIAGNOSIS — R29898 Other symptoms and signs involving the musculoskeletal system: Secondary | ICD-10-CM

## 2017-09-03 DIAGNOSIS — M25562 Pain in left knee: Secondary | ICD-10-CM

## 2017-09-03 DIAGNOSIS — E785 Hyperlipidemia, unspecified: Secondary | ICD-10-CM

## 2017-09-03 MED ORDER — AMLODIPINE BESYLATE 2.5 MG PO TABS: ORAL_TABLET | ORAL | 3 refills | Status: DC

## 2017-09-03 NOTE — Patient Instructions (Signed)
Ms. Kayla Chan , Thank you for taking time to come for your Medicare Wellness Visit. I appreciate your ongoing commitment to your health goals. Please review the following plan we discussed and let me know if I can assist you in the future.   Screening recommendations/referrals: Colonoscopy: N/A Mammogram: N/A Bone Density: Pt declines today.  Recommended yearly ophthalmology/optometry visit for glaucoma screening and checkup Recommended yearly dental visit for hygiene and checkup  Vaccinations: Influenza vaccine: Up to date Pneumococcal vaccine: Up to date Tdap vaccine: Pt declines today.  Shingles vaccine: Pt declines today.     Advanced directives: Advance directive discussed with you today. I have provided a copy for you to complete at home and have notarized. Once this is complete please bring a copy in to our office so we can scan it into your chart.  Conditions/risks identified: Obesity- recommend cutting back on the bad carbohydrates and focusing on eating more healthy carbohydrates. Avoid eating large consumption's of bread and butter.   Next appointment: 10:00 AM today   Preventive Care 65 Years and Older, Female Preventive care refers to lifestyle choices and visits with your health care provider that can promote health and wellness. What does preventive care include?  A yearly physical exam. This is also called an annual well check.  Dental exams once or twice a year.  Routine eye exams. Ask your health care provider how often you should have your eyes checked.  Personal lifestyle choices, including:  Daily care of your teeth and gums.  Regular physical activity.  Eating a healthy diet.  Avoiding tobacco and drug use.  Limiting alcohol use.  Practicing safe sex.  Taking low-dose aspirin every day.  Taking vitamin and mineral supplements as recommended by your health care provider. What happens during an annual well check? The services and screenings done by  your health care provider during your annual well check will depend on your age, overall health, lifestyle risk factors, and family history of disease. Counseling  Your health care provider may ask you questions about your:  Alcohol use.  Tobacco use.  Drug use.  Emotional well-being.  Home and relationship well-being.  Sexual activity.  Eating habits.  History of falls.  Memory and ability to understand (cognition).  Work and work Statistician.  Reproductive health. Screening  You may have the following tests or measurements:  Height, weight, and BMI.  Blood pressure.  Lipid and cholesterol levels. These may be checked every 5 years, or more frequently if you are over 82 years old.  Skin check.  Lung cancer screening. You may have this screening every year starting at age 82 if you have a 30-pack-year history of smoking and currently smoke or have quit within the past 15 years.  Fecal occult blood test (FOBT) of the stool. You may have this test every year starting at age 82.  Flexible sigmoidoscopy or colonoscopy. You may have a sigmoidoscopy every 5 years or a colonoscopy every 10 years starting at age 82.  Hepatitis C blood test.  Hepatitis B blood test.  Sexually transmitted disease (STD) testing.  Diabetes screening. This is done by checking your blood sugar (glucose) after you have not eaten for a while (fasting). You may have this done every 1-3 years.  Bone density scan. This is done to screen for osteoporosis. You may have this done starting at age 82.  Mammogram. This may be done every 1-2 years. Talk to your health care provider about how often you should have  regular mammograms. Talk with your health care provider about your test results, treatment options, and if necessary, the need for more tests. Vaccines  Your health care provider may recommend certain vaccines, such as:  Influenza vaccine. This is recommended every year.  Tetanus,  diphtheria, and acellular pertussis (Tdap, Td) vaccine. You may need a Td booster every 10 years.  Zoster vaccine. You may need this after age 85.  Pneumococcal 13-valent conjugate (PCV13) vaccine. One dose is recommended after age 82.  Pneumococcal polysaccharide (PPSV23) vaccine. One dose is recommended after age 82. Talk to your health care provider about which screenings and vaccines you need and how often you need them. This information is not intended to replace advice given to you by your health care provider. Make sure you discuss any questions you have with your health care provider. Document Released: 09/06/2015 Document Revised: 04/29/2016 Document Reviewed: 06/11/2015 Elsevier Interactive Patient Education  2017 [place] Prevention in the Home Falls can cause injuries. They can happen to people of all ages. There are many things you can do to make your home safe and to help prevent falls. What can I do on the outside of my home?  Regularly fix the edges of walkways and driveways and fix any cracks.  Remove anything that might make you trip as you walk through a door, such as a raised step or threshold.  Trim any bushes or trees on the path to your home.  Use bright outdoor lighting.  Clear any walking paths of anything that might make someone trip, such as rocks or tools.  Regularly check to see if handrails are loose or broken. Make sure that both sides of any steps have handrails.  Any raised decks and porches should have guardrails on the edges.  Have any leaves, snow, or ice cleared regularly.  Use sand or salt on walking paths during winter.  Clean up any spills in your garage right away. This includes oil or grease spills. What can I do in the bathroom?  Use night lights.  Install grab bars by the toilet and in the tub and shower. Do not use towel bars as grab bars.  Use non-skid mats or decals in the tub or shower.  If you need to sit down in  the shower, use a plastic, non-slip stool.  Keep the floor dry. Clean up any water that spills on the floor as soon as it happens.  Remove soap buildup in the tub or shower regularly.  Attach bath mats securely with double-sided non-slip rug tape.  Do not have throw rugs and other things on the floor that can make you trip. What can I do in the bedroom?  Use night lights.  Make sure that you have a light by your bed that is easy to reach.  Do not use any sheets or blankets that are too big for your bed. They should not hang down onto the floor.  Have a firm chair that has side arms. You can use this for support while you get dressed.  Do not have throw rugs and other things on the floor that can make you trip. What can I do in the kitchen?  Clean up any spills right away.  Avoid walking on wet floors.  Keep items that you use a lot in easy-to-reach places.  If you need to reach something above you, use a strong step stool that has a grab bar.  Keep electrical cords out of the  way.  Do not use floor polish or wax that makes floors slippery. If you must use wax, use non-skid floor wax.  Do not have throw rugs and other things on the floor that can make you trip. What can I do with my stairs?  Do not leave any items on the stairs.  Make sure that there are handrails on both sides of the stairs and use them. Fix handrails that are broken or loose. Make sure that handrails are as long as the stairways.  Check any carpeting to make sure that it is firmly attached to the stairs. Fix any carpet that is loose or worn.  Avoid having throw rugs at the top or bottom of the stairs. If you do have throw rugs, attach them to the floor with carpet tape.  Make sure that you have a light switch at the top of the stairs and the bottom of the stairs. If you do not have them, ask someone to add them for you. What else can I do to help prevent falls?  Wear shoes that:  Do not have high  heels.  Have rubber bottoms.  Are comfortable and fit you well.  Are closed at the toe. Do not wear sandals.  If you use a stepladder:  Make sure that it is fully opened. Do not climb a closed stepladder.  Make sure that both sides of the stepladder are locked into place.  Ask someone to hold it for you, if possible.  Clearly mark and make sure that you can see:  Any grab bars or handrails.  First and last steps.  Where the edge of each step is.  Use tools that help you move around (mobility aids) if they are needed. These include:  Canes.  Walkers.  Scooters.  Crutches.  Turn on the lights when you go into a dark area. Replace any light bulbs as soon as they burn out.  Set up your furniture so you have a clear path. Avoid moving your furniture around.  If any of your floors are uneven, fix them.  If there are any pets around you, be aware of where they are.  Review your medicines with your doctor. Some medicines can make you feel dizzy. This can increase your chance of falling. Ask your doctor what other things that you can do to help prevent falls. This information is not intended to replace advice given to you by your health care provider. Make sure you discuss any questions you have with your health care provider. Document Released: 06/06/2009 Document Revised: 01/16/2016 Document Reviewed: 09/14/2014 Elsevier Interactive Patient Education  2017 [employer].

## 2017-09-03 NOTE — Progress Notes (Signed)
Patient: Kayla Chan Female    DOB: 08-29-1931   84 y.o.   MRN: [id number] Visit Date: 09/03/2017  Today's Provider: Lelon Huh, MD   Chief Complaint  Patient presents with  . Hypertension    follow up  . Hyperglycemia    follow up  . Hypothyroidism    follow up  . Hyperlipidemia    follow up   Subjective:    HPI  Hypertension, follow-up:  BP Readings from Last 3 Encounters:  09/03/17 (!) 162/88  08/06/17 120/90  07/12/17 (!) 150/90    She was last seen for hypertension 1 months ago.  BP at that visit was 120/90. Management since that visit includes adding Amlodipine in the evening. Patient was advised  to continue morning dose of Lisinopril..States that dizziness is a little better.  She reports good compliance with treatment. She is not having side effects.  She is not exercising. She is not adherent to low salt diet.   Outside blood pressures are 140/80, sometimes in the 130s.  She is experiencing none.  Patient denies chest pain, chest pressure/discomfort, claudication, dyspnea, exertional chest pressure/discomfort, fatigue, irregular heart beat, lower extremity edema, near-syncope, orthopnea, palpitations, paroxysmal nocturnal dyspnea, syncope and tachypnea.   Cardiovascular risk factors include advanced age (older than 46 for men, 37 for women), dyslipidemia, hypertension and obesity (BMI >= 30 kg/m2).  Use of agents associated with hypertension: thyroid hormones.     Weight trend: stable Wt Readings from Last 3 Encounters:  09/03/17 245 lb 12.8 oz (111.5 kg)  08/06/17 245 lb (111.1 kg)  07/12/17 247 lb (112 kg)    Current diet: in general, a "healthy" diet    ------------------------------------------------------------------------  Prediabetes, Follow-up:   Lab Results  Component Value Date   HGBA1C 5.9 (H) 01/13/2017   HGBA1C 5.8 (H) 07/15/2016   HGBA1C 5.7 01/08/2016   GLUCOSE 96 01/13/2017   GLUCOSE 101 (H) 07/15/2016   GLUCOSE 98  02/04/2016    Last seen for for this 8 months ago.  Management since that visit includes no changes. Current symptoms include none and have been stable.  Weight trend: stable Prior visit with dietician: no Current diet: in general, a "healthy" diet   Current exercise: none  Pertinent Labs:    Component Value Date/Time   CHOL 212 (H) 01/13/2017 1040   TRIG 201 (H) 01/13/2017 1040   CHOLHDL 4.2 01/13/2017 1040   CREATININE 0.94 01/13/2017 1040   CREATININE 1.10 08/05/2013 1238    Wt Readings from Last 3 Encounters:  09/03/17 245 lb 12.8 oz (111.5 kg)  08/06/17 245 lb (111.1 kg)  07/12/17 247 lb (112 kg)   Follow up of Hypothyroidism:  Patient was last seen for this problem 8 months ago and no changes were made. Patient reports good compliance with treatment and good tolerance. Lab Results  Component Value Date   TSH 2.060 01/13/2017        Lipid/Cholesterol, Follow-up:   Last seen for this 8 months ago.  Management changes since that visit include none. . Last Lipid Panel:    Component Value Date/Time   CHOL 212 (H) 01/13/2017 1040   TRIG 201 (H) 01/13/2017 1040   HDL 50 01/13/2017 1040   CHOLHDL 4.2 01/13/2017 1040   LDLCALC 122 (H) 01/13/2017 1040    Risk factors for vascular disease include hypercholesterolemia and hypertension  She reports good compliance with treatment. She is not having side effects.  Current symptoms include none and have  been stable. Weight trend: stable Prior visit with dietician: no Current diet: in general, a "healthy" diet   Current exercise: none  Wt Readings from Last 3 Encounters:  09/03/17 245 lb 12.8 oz (111.5 kg)  08/06/17 245 lb (111.1 kg)  07/12/17 247 lb (112 kg)    -------------------------------------------------------------------     Allergies  Allergen Reactions  . Atorvastatin     Other reaction(s): Muscle Pain  . Colestid  [Colestipol Hcl]     Itching, insomnia  . Crestor  [Rosuvastatin Calcium]       Other reaction(s): Muscle Pain  . Doxycycline   . Fluvastatin Sodium     Other reaction(s): Muscle Pain  . Furosemide     Severe Cramping  . Penicillins   . Pravastatin Sodium     Numbness in legs  . Verapamil     Shortness of breath, swelling, palpations.      Current Outpatient Medications:  .  amLODipine (NORVASC) 2.5 MG tablet, Take one tablet every evening, Disp: 30 tablet, Rfl: 1 .  aspirin 81 MG tablet, Take 81 mg by mouth daily., Disp: , Rfl:  .  diclofenac (CATAFLAM) 50 MG tablet, TAKE 1 TABLET (50 MG TOTAL) BY MOUTH 2 (TWO) TIMES DAILY AS NEEDED (FOOT PAIN)., Disp: 30 tablet, Rfl: 2 .  ezetimibe (ZETIA) 10 MG tablet, TAKE 1 TABLET BY MOUTH IN THE MORNING, Disp: 30 tablet, Rfl: 12 .  furosemide (LASIX) 20 MG tablet, Take 1 tablet (20 mg total) by mouth daily as needed for edema. (Patient not taking: Reported on 09/03/2017), Disp: 30 tablet, Rfl: 5 .  levothyroxine (SYNTHROID, LEVOTHROID) 75 MCG tablet, TAKE 1 TABLET BY MOUTH EVERY DAY, Disp: 90 tablet, Rfl: 4 .  lisinopril (PRINIVIL,ZESTRIL) 5 MG tablet, Take 1 tablet (5 mg total) by mouth daily., Disp: 1 tablet, Rfl: 1 .  mometasone (ELOCON) 0.1 % ointment, Apply 1 application daily as needed topically. Apply to affected area, Disp: 15 g, Rfl: 5 .  Multiple Vitamins-Minerals (MULTIVITAMIN ADULT PO), Take 1 tablet by mouth daily., Disp: , Rfl:   Review of Systems  Constitutional: Negative for chills, fatigue and fever.  HENT: Negative for congestion, ear pain, rhinorrhea, sneezing and sore throat.   Eyes: Negative.  Negative for pain and redness.  Respiratory: Negative for cough, shortness of breath and wheezing.   Cardiovascular: Negative for chest pain and leg swelling.  Gastrointestinal: Negative for abdominal pain, blood in stool, constipation, diarrhea and nausea.  Endocrine: Negative for polydipsia and polyphagia.  Genitourinary: Negative.  Negative for dysuria, flank pain, hematuria, pelvic pain, vaginal bleeding  and vaginal discharge.  Musculoskeletal: Positive for arthralgias. Negative for back pain, gait problem and joint swelling.  Skin: Negative for rash.  Neurological: Negative.  Negative for dizziness, tremors, seizures, weakness, light-headedness, numbness and headaches.  Hematological: Negative for adenopathy.  Psychiatric/Behavioral: Negative.  Negative for behavioral problems, confusion and dysphoric mood. The patient is not nervous/anxious and is not hyperactive.     Social History   Tobacco Use  . Smoking status: Never Smoker  . Smokeless tobacco: Never Used  Substance Use Topics  . Alcohol use: Yes    Alcohol/week: 0.0 oz    Comment: 1 glass of wine occasionally   Objective:   BP 140/80 (BP Location: Left Wrist, Patient Position: Sitting, Cuff Size: Large)  There were no vitals filed for this visit.  Most recent update: 09/03/2017 9:41 AM by Fabio Neighbors, LPN  BP  081/44 Abnormal   (BP Location: Left Arm)  Pulse  72     Temp  97.6 F (36.4 C) (Oral)     Ht  4\' 11"  (1.499 m)     Wt  245 lb 12.8 oz (111.5 kg)      BMI  49.65 kg/m      Vitals History     Physical Exam  General Appearance:    Alert, cooperative, no distress, obese  Eyes:    PERRL, conjunctiva/corneas clear, EOM's intact       Lungs:     Clear to auscultation bilaterally, respirations unlabored  Heart:    Regular rate and rhythm  Neurologic:   Awake, alert, oriented x 3. No apparent focal neurological           defect.   MS: Slow to get up , over 1 minute to get up and walk to and get on exam table due to weakness of legs and knee pain. Poor balance when walking without her cane.        Assessment & Plan:     1. Essential hypertension Doing better on current regiment, dizziness mostly resolved. Continue current medications.   - amLODipine (NORVASC) 2.5 MG tablet; Take one tablet every evening  Dispense: 90 tablet; Refill: 3 - EKG 12-Lead  2. Hypothyroidism, unspecified  type Compliant with levothyroxine.  - TSH  3. Pre-diabetes  - Hemoglobin A1c  4. Hyperlipidemia, unspecified hyperlipidemia type Tolerating ezetimibe.  - Lipid panel - Comprehensive metabolic panel  5. Weakness of both lower extremities  - Ambulatory referral to Physical Therapy  6. Chronic pain of left knee  - Ambulatory referral to Physical Therapy  7. Balance disorder  - Ambulatory referral to Physical Therapy  8. Estrogen deficiency  - DG Bone Density; Future  9. BMI 45.0-49.9, adult ([place]) Counseled regarding prudent diet and regular exercise.         Lelon Huh, MD  [facility]

## 2017-09-03 NOTE — Progress Notes (Signed)
Subjective:   Kayla Chan is a 84 y.o. female who presents for Medicare Annual (Subsequent) preventive examination.  Review of Systems:  N/A  Cardiac Risk Factors include: advanced age (>7men, >32 women);dyslipidemia;hypertension;obesity (BMI >30kg/m2)     Objective:     Vitals: BP (!) 162/88 (BP Location: Left Arm)   Pulse 72   Temp 97.6 F (36.4 C) (Oral)   Ht 4\' 11"  (1.499 m)   Wt 245 lb 12.8 oz (111.5 kg)   BMI 49.65 kg/m   Body mass index is 49.65 kg/m.  Advanced Directives 09/03/2017 09/03/2017 08/19/2016  Does Patient Have a Medical Advance Directive? No No No  Does patient want to make changes to medical advance directive? - - No - Patient declined  Would patient like information on creating a medical advance directive? Yes (MAU/Ambulatory/Procedural Areas - Information given) No - Patient declined -    Tobacco Social History   Tobacco Use  Smoking Status Never Smoker  Smokeless Tobacco Never Used     Counseling given: Not Answered   Clinical Intake:  Pre-visit preparation completed: Yes  Pain : No/denies pain Pain Score: 0-No pain     Nutritional Status: BMI > 30  Obese Nutritional Risks: None Diabetes: No  How often do you need to have someone help you when you read instructions, pamphlets, or other written materials from your doctor or pharmacy?: 1 - Never  Interpreter Needed?: No  Information entered by :: [facility], LPN  Past Medical History:  Diagnosis Date  . Cancer ([place])    [place] on nose   Past Surgical History:  Procedure Laterality Date  . 24 hour Holter Monitor  01/2013   4550 ectopic beats with 344 superventricular bigemminy events  . Abdominal Ultrasound  05/31/2013   RUQ. Fatty Liver  . APPENDECTOMY  1950   [place]  . 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, [place]; Right   Family History  Problem Relation Age of Onset  . Stroke Mother        possible stroke  . Heart Problems Father     . Kidney cancer Sister   . Bladder Cancer Brother    Social History   Socioeconomic History  . Marital status: Married    Spouse name: None  . Number of children: 0  . Years of education: None  . Highest education level: Some college, no degree  Social Needs  . Financial resource strain: Not hard at all  . Food insecurity - worry: Never true  . Food insecurity - inability: Never true  . Transportation needs - medical: No  . Transportation needs - non-medical: No  Occupational History  . Occupation: Retired  Tobacco Use  . Smoking status: Never Smoker  . Smokeless tobacco: Never Used  Substance and Sexual Activity  . Alcohol use: Yes    Alcohol/week: 0.0 oz    Comment: 1 glass of wine occasionally  . Drug use: No  . Sexual activity: None  Other Topics Concern  . None  Social History Narrative  . None    Outpatient Encounter Medications as of 09/03/2017  Medication Sig  . amLODipine (NORVASC) 2.5 MG tablet Take one tablet every evening  . aspirin 81 MG tablet Take 81 mg by mouth daily.  . diclofenac (CATAFLAM) 50 MG tablet TAKE 1 TABLET (50 MG TOTAL) BY MOUTH 2 (TWO) TIMES DAILY AS NEEDED (FOOT PAIN).  Marland Kitchen ezetimibe (ZETIA) 10 MG tablet TAKE 1 TABLET BY MOUTH IN THE MORNING  .  levothyroxine (SYNTHROID, LEVOTHROID) 75 MCG tablet TAKE 1 TABLET BY MOUTH EVERY DAY  . lisinopril (PRINIVIL,ZESTRIL) 5 MG tablet Take 1 tablet (5 mg total) by mouth daily.  . mometasone (ELOCON) 0.1 % ointment Apply 1 application daily as needed topically. Apply to affected area  . Multiple Vitamins-Minerals (MULTIVITAMIN ADULT PO) Take 1 tablet by mouth daily.  . furosemide (LASIX) 20 MG tablet Take 1 tablet (20 mg total) by mouth daily as needed for edema. (Patient not taking: Reported on 09/03/2017)  . [DISCONTINUED] ranitidine (ZANTAC) 300 MG capsule Take 1 capsule (300 mg total) by mouth daily as needed (stomach pain).  . [DISCONTINUED] senna (SENOKOT) 8.6 MG tablet Take 1 tablet by mouth daily.    No facility-administered encounter medications on file as of 09/03/2017.     Activities of Daily Living In your present state of health, do you have any difficulty performing the following activities: 09/03/2017  Hearing? Y  Comment Wears bilateral hearing aids.  Vision? N  Difficulty concentrating or making decisions? N  Walking or climbing stairs? Y  Comment Due to knee pain  Dressing or bathing? N  Doing errands, shopping? N  Preparing Food and eating ? N  Using the Toilet? N  In the past six months, have you accidently leaked urine? N  Do you have problems with loss of bowel control? N  Managing your Medications? N  Managing your Finances? N  Housekeeping or managing your Housekeeping? N  Some recent data might be hidden    Patient Care Team: Birdie Sons, MD as PCP - General (Family Medicine) Regino Schultze, MD as Referring Physician (Dermatology) Birder Robson, MD as Referring Physician (Ophthalmology)    Assessment:   This is a routine wellness examination for Pierina.  Exercise Activities and Dietary recommendations Current Exercise Habits: Home exercise routine, Type of exercise: stretching(leg and feet exercise), Time (Minutes): 10, Frequency (Times/Week): 3(to 4 days ), Weekly Exercise (Minutes/Week): 30, Exercise limited by: orthopedic condition(s)  Goals    None      Fall Risk Fall Risk  09/03/2017 09/03/2017 08/19/2016 07/13/2016 07/01/2015  Falls in the past year? No No No No No   Is the patient's home free of loose throw rugs in walkways, pet beds, electrical cords, etc?   yes      Grab bars in the bathroom? yes      Handrails on the stairs?   yes      Adequate lighting?   yes  Timed Get Up and Go performed: N/A  Depression Screen PHQ 2/9 Scores 09/03/2017 09/03/2017 09/03/2017 08/19/2016  PHQ - 2 Score 0 0 0 0  PHQ- 9 Score 1 - - -     Cognitive Function: Pt declined screening today.     6CIT Screen 08/19/2016  What Year? 0 points   What month? 0 points  What time? 0 points  Count back from 20 0 points  Months in reverse 0 points  Repeat phrase 4 points  Total Score 4    Immunization History  Administered Date(s) Administered  . Influenza Split 06/11/2007, 06/09/2008, 05/30/2009  . Influenza, High Dose Seasonal PF 05/31/2015, 05/30/2016, 06/10/2017  . Influenza,inj,Quad PF,6+ Mos 06/20/2013, 05/09/2014  . Pneumococcal Conjugate-13 05/09/2014  . Pneumococcal Polysaccharide-23 05/24/2006    Qualifies for Shingles Vaccine? Due for Shingles vaccine. Declined my offer to administer today. Education has been provided regarding the importance of this vaccine. Pt has been advised to call her insurance company to determine her out of pocket  expense. Advised she may also receive this vaccine at her local pharmacy or Health Dept. Verbalized acceptance and understanding.  Screening Tests Health Maintenance  Topic Date Due  . TETANUS/TDAP  05/07/1953  . DEXA SCAN  08/19/2026 (Originally 05/08/1999)  . INFLUENZA VACCINE  Completed  . PNA vac Low Risk Adult  Completed    Cancer Screenings: Lung: Low Dose CT Chest recommended if Age 90-80 years, 30 pack-year currently smoking OR have quit w/in 15years. Patient does not qualify. Breast:  Up to date on Mammogram? Yes   Up to date of Bone Density/Dexa? No, pt declined referral today.  Colorectal: N/A  Additional Screenings:  Hepatitis B/HIV/Syphillis: Pt declines today.  Hepatitis C Screening: Pt declines today.     Plan:  I have personally reviewed and addressed the Medicare Annual Wellness questionnaire and have noted the following in the patient's chart:  A. Medical and social history B. Use of alcohol, tobacco or illicit drugs  C. Current medications and supplements D. Functional ability and status E.  Nutritional status F.  Physical activity G. Advance directives H. List of other physicians I.  Hospitalizations, surgeries, and ER visits in previous 12  months J.  [place] such as hearing and vision if needed, cognitive and depression L. Referrals and appointments - none  In addition, I have reviewed and discussed with patient certain preventive protocols, quality metrics, and best practice recommendations. A written personalized care plan for preventive services as well as general preventive health recommendations were provided to patient.  See attached scanned questionnaire for additional information.   Signed,  Fabio Neighbors, LPN Nurse Health Advisor   Nurse Recommendations: None. Pt declined the tetanus vaccine and DEXA referral today. Pt also declined the repeat BP check and would like this done at next OV with PCP.

## 2017-09-04 LAB — COMPREHENSIVE METABOLIC PANEL
ALBUMIN: 4.1 g/dL (ref 3.5–4.7)
ALT: 9 IU/L (ref 0–32)
AST: 18 IU/L (ref 0–40)
Albumin/Globulin Ratio: 1.5 (ref 1.2–2.2)
Alkaline Phosphatase: 69 IU/L (ref 39–117)
BUN / CREAT RATIO: 24 (ref 12–28)
BUN: 24 mg/dL (ref 8–27)
Bilirubin Total: 0.3 mg/dL (ref 0.0–1.2)
CALCIUM: 9.6 mg/dL (ref 8.7–10.3)
CO2: 21 mmol/L (ref 20–29)
Chloride: 106 mmol/L (ref 96–106)
Creatinine, Ser: 1.02 mg/dL — ABNORMAL HIGH (ref 0.57–1.00)
GFR, EST AFRICAN AMERICAN: 59 mL/min/{1.73_m2} — AB (ref >59)
GFR, EST NON AFRICAN AMERICAN: 51 mL/min/{1.73_m2} — AB (ref >59)
GLUCOSE: 104 mg/dL — AB (ref 65–99)
Globulin, Total: 2.7 g/dL (ref 1.5–4.5)
Potassium: 4.6 mmol/L (ref 3.5–5.2)
Sodium: 143 mmol/L (ref 134–144)
TOTAL PROTEIN: 6.8 g/dL (ref 6.0–8.5)

## 2017-09-04 LAB — LIPID PANEL
CHOL/HDL RATIO: 4.7 ratio — AB (ref 0.0–4.4)
Cholesterol, Total: 237 mg/dL — ABNORMAL HIGH (ref 100–199)
HDL: 50 mg/dL (ref >39)
LDL Calculated: 139 mg/dL — ABNORMAL HIGH (ref 0–99)
TRIGLYCERIDES: 238 mg/dL — AB (ref 0–149)
VLDL Cholesterol Cal: 48 mg/dL — ABNORMAL HIGH (ref 5–40)

## 2017-09-04 LAB — HEMOGLOBIN A1C
ESTIMATED AVERAGE GLUCOSE: 131 mg/dL
Hgb A1c MFr Bld: 6.2 % — ABNORMAL HIGH (ref 4.8–5.6)

## 2017-09-04 LAB — TSH: TSH: 2.14 u[IU]/mL (ref 0.450–4.500)

## 2017-09-14 DIAGNOSIS — H2513 Age-related nuclear cataract, bilateral: Secondary | ICD-10-CM | POA: Diagnosis not present

## 2017-10-06 ENCOUNTER — Other Ambulatory Visit: Payer: Self-pay

## 2017-10-19 ENCOUNTER — Encounter: Payer: Self-pay | Admitting: Physical Therapy

## 2017-10-19 ENCOUNTER — Ambulatory Visit: Payer: PPO | Attending: Family Medicine | Admitting: Physical Therapy

## 2017-10-19 ENCOUNTER — Other Ambulatory Visit: Payer: Self-pay

## 2017-10-19 DIAGNOSIS — M6281 Muscle weakness (generalized): Secondary | ICD-10-CM | POA: Diagnosis not present

## 2017-10-19 DIAGNOSIS — R262 Difficulty in walking, not elsewhere classified: Secondary | ICD-10-CM | POA: Diagnosis not present

## 2017-10-19 DIAGNOSIS — M25562 Pain in left knee: Secondary | ICD-10-CM | POA: Insufficient documentation

## 2017-10-19 DIAGNOSIS — G8929 Other chronic pain: Secondary | ICD-10-CM | POA: Insufficient documentation

## 2017-10-19 NOTE — Patient Instructions (Signed)
ABDUCTION: Sitting - Exercise Ball: Resistance Band (Active)   Sit with feet flat. With band tied around both legs, Lift right leg slightly and, against resistance band, draw it out to side. Complete __2_ sets of __10_ repetitions. Perform _2__ sessions per day.  Copyright  VHI. All rights reserved.  FLEXION: Sitting - Resistance Band (Active)   Sit, both feet flat. Have band tied around both legs above knees, lift right knee toward ceiling.Repeat with other knee Complete _2__ sets of _10__ repetitions. Perform _2__ sessions per day.  http://gtsc.exer.us/21   Knee Extension: Resisted (Sitting)   With band looped around right ankle and under other foot, straighten leg with ankle loop. Keep other leg bent to increase resistance. Repeat _10___ times per set. Do __2__ sets per session. Do _2___ sessions per day.  http://orth.exer.us/691   Copyright  VHI. All rights reserved.   Copyright  VHI. All rights reserved.  Copyright  VHI. All rights reserved.  FLEXION: Sitting - Resistance Band (Active)   Sit with right foot flat. Have band tied around both feet, bend ankle, bringing toes toward head. Complete __2_ sets of __10_ repetitions. Perform _2__ sessions per day.  Copyright  VHI. All rights reserved.  Toe / Heel Raise (Sitting)   Sitting, raise heels, then rock back on heels and raise toes. Repeat _10___ times.  Copyright  VHI. All rights reserved.  HIP / KNEE: Extension - Sit to Stand   Sitting, lean chest forward, raise hips up from surface. Straighten hips and knees. Weight bear equally on left and right sides. Backs of legs should not push off surface. __10_ reps per set, __2_ sets per day, _5__ days per week Use assistive device as needed.   

## 2017-10-19 NOTE — Therapy (Signed)
[place] MAIN [facility] SERVICES [address] [place], [place], [address] Phone: [phone]   Fax:  [phone]  Physical Therapy Evaluation  Patient Details  Name: Kayla Chan MRN: [id number] Date of Birth: 08/01/1932 Referring Provider: Lelon Huh MD   Encounter Date: 10/19/2017  PT End of Session - 10/19/17 1554    Visit Number  1    Number of Visits  13    Date for PT Re-Evaluation  11/30/17    PT Start Time  8366    PT Stop Time  1605    PT Time Calculation (min)  47 min    Activity Tolerance  Patient tolerated treatment well;No increased pain    Behavior During Therapy  WFL for tasks assessed/performed       Past Medical History:  Diagnosis Date  . Cancer ([place])    BCC on nose  . Hypertension     Past Surgical History:  Procedure Laterality Date  . 24 hour Holter Monitor  01/2013   4550 ectopic beats with 344 superventricular bigemminy events  . Abdominal Ultrasound  05/31/2013   RUQ. Fatty Liver  . APPENDECTOMY  1950   [place]  . 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, [place]; Right    There were no vitals filed for this visit.   Subjective Assessment - 10/19/17 1526    Subjective  "My legs were really bothering me last night. I didn't rest well."     Pertinent History  84 yo Female reports chronic knee pain (L>R) and weakness in both legs. She has had TKR (5-10 years ago each leg); Patient is hard of hearing; She presents to therapy with [facility] and states that she has been using it for last 3-4 years; She reports weakness off/on for last few years. Patient has numbness in left knee; She denies any numbness anywhere else; She denies any recent falls; She reports that when inside the house she does not use the cane, but will hold onto furniture;     How long can you sit comfortably?  NA- able to sit as long as needed;     How long can you stand comfortably?  Pt reports that she does all her  housework- will stand 3-4 hours;     How long can you walk comfortably?  <500 feet;     Diagnostic tests  Reports recent x-rays- states that left ankle showed a spur; everything looked okay in the knees- MD states that she did not need surgery per patient;     Patient Stated Goals  "loosen up, be able to walk better."     Currently in Pain?  No/denies    Multiple Pain Sites  No         [facility] PT Assessment - 10/19/17 0001      Assessment   Medical Diagnosis  Weakness in BLE, left knee pain    Referring Provider  Lelon Huh MD    Onset Date/Surgical Date  -- off/on for last few years    Hand Dominance  Right    Next MD Visit  6 months    Prior Therapy  had PT following total knee replacement; denies any recent therapy for this condition;       Precautions   Precautions  Fall    Required Braces or Orthoses  -- none      Restrictions   Weight Bearing Restrictions  No      Balance Screen  Has the patient fallen in the past 6 months  No    Has the patient had a decrease in activity level because of a fear of falling?   Yes    Is the patient reluctant to leave their home because of a fear of falling?   No      Home Environment   Additional Comments  Lives in single story home with 2 steps to enter, B rails; lives with husband; mod I for self care ADLs;       Prior Function   Level of Independence  Independent;Requires assistive device for independence;Independent with gait;Independent with transfers used [facility] for last 3-4 years    Vocation  Retired    Leisure  go shopping, does have some difficulty with shopping with LE weakness; does use buggy in store;       Cognition   Overall Cognitive Status  Within Functional Limits for tasks assessed      Observation/Other Assessments   Observations  very pleasant woman, sits with erect posture;     Lower Extremity Functional Scale   48/80 (the lower the score the greater the disability)      Sensation   Light Touch  Appears Intact     Additional Comments  reports decreased light touch sensation along left knee, but grossly intact;       Coordination   Gross Motor Movements are Fluid and Coordinated  Yes    Fine Motor Movements are Fluid and Coordinated  No had difficulty with hand turning/disdiacokinesia present    Finger Nose Finger Test  accurate BUE      Posture/Postural Control   Posture Comments  sits with erect posture, minimal slump noted;       ROM / Strength   AROM / PROM / Strength  AROM;Strength      AROM   Overall AROM Comments  BUE and BLE are [facility]      Strength   Overall Strength Comments  BUE grossly 3/5, decreased grip strength on right due to arthritis;     Right Hip Flexion  4/5    Right Hip Extension  4-/5    Right Hip ABduction  4-/5    Right Hip ADduction  4-/5    Left Hip Flexion  4-/5    Left Hip Extension  4-/5    Left Hip ABduction  4-/5    Left Hip ADduction  4-/5    Right Knee Flexion  4+/5    Right Knee Extension  4+/5    Left Knee Flexion  4+/5    Left Knee Extension  4+/5    Right Ankle Dorsiflexion  4/5    Left Ankle Dorsiflexion  4/5      Transfers   Comments  able to transfer from regular height chair without pushing on chair; independent;       Ambulation/Gait   Stair Management Technique  Two rails;Step to pattern;Forwards modified independent    Number of Stairs  4    Height of Stairs  -- 4 inches    Gait Comments  ambulates on even surface with reciprocal gait pattern, uses SPC in RUE, increased ankle IV at heel strike with slight toe in, shorter step length, slower gait speed;       Standardized Balance Assessment   Five times sit to stand comments   13.6 sec without HHA (<15 sec indicates low fall risk)    10 Meter Walk  0.66 m/s with SPC (limited home ambulator)  High Level Balance   High Level Balance Comments  static standing balance is good, dynamic standing balance is fair; slight unsteadiness noted with decreased weight shift without HHA/AD              Objective measurements completed on examination: See above findings.      TREATMENT: Initiated HEP: Seated with red band around BLE: Hip abduction/ER x10 reps Hip flexion march x10 reps LAQ x10 reps Ankle DF x10 reps All exercise bilaterally; Patient required min-moderate verbal/tactile cues for correct exercise technique.         PT Education - 10/19/17 1554    Education provided  Yes    Education Details  LE strengthening, HEP initiated, recommendations;     Person(s) Educated  Patient    Methods  Explanation;Demonstration;Verbal cues;Handout    Comprehension  Verbalized understanding;Returned demonstration;Verbal cues required;Need further instruction       PT Short Term Goals - 10/19/17 1608      PT SHORT TERM GOAL #1   Title  Patient will be adherent to HEP at least 3x a week to improve functional strength and balance for better safety at home.    Time  4    Period  Weeks    Status  New    Target Date  11/16/17      PT SHORT TERM GOAL #2   Title  Patient will increase 10 meter walk test to >1.56m/s as to improve gait speed for better community ambulation and to reduce fall risk.    Time  4    Period  Weeks    Status  New    Target Date  11/16/17      PT SHORT TERM GOAL #3   Title  Patient will increase BLE gross strength to 4+/5 as to improve functional strength for independent gait, increased standing tolerance and increased ADL ability.    Time  4    Period  Weeks    Status  New    Target Date  11/16/17        PT Long Term Goals - 10/19/17 1609      PT LONG TERM GOAL #1   Title  Patient will be independent in home exercise program to improve strength/mobility for better functional independence with ADLs.    Time  6    Period  Weeks    Status  New    Target Date  11/30/17      PT LONG TERM GOAL #2   Title  Patient will ascend/descend 4 stairs with 1 rail assist independently forward reciprocally, without loss of balance to  improve ability to get in/out of home.     Time  6    Period  Weeks    Status  New    Target Date  11/30/17      PT LONG TERM GOAL #3   Title  Patient will increase lower extremity functional scale to >60/80 to demonstrate improved functional mobility and increased tolerance with ADLs.     Time  6    Period  Weeks    Status  New    Target Date  11/30/17      PT LONG TERM GOAL #4   Title  Patient will report a worst pain of 3/10 on VAS in   each knee          to improve tolerance with ADLs and reduced symptoms with activities.     Time  6  Period  Weeks    Status  New    Target Date  11/30/17             Plan - 10/19/17 1555    Clinical Impression Statement  84 yo Female was referred to physical therapy with left knee pain and weakness in Both legs. Patient reported no knee pain throughout evaluation even during MMT, stair negotiation and transfers. She does ambulate at slower gait speed with shorter step length. Patient does exhibit increased weakness in BLE particularly in hip; Patient did test at a slight risk for falls and is slightly unsteady during standing dynamic tasks. She would benefit from additional skilled PT intervention to improve strength, balance and gait safety;     History and Personal Factors relevant to plan of care:  morbid obesity, chronic knee pain/weakness; increased risk for falls; has 2 steps to enter house, older in age; lives with husband, minimal co-morbidities, no recent falls    Clinical Presentation  Stable    Clinical Presentation due to:  weakness is isolate mostly to hip; minimal knee pain reported during evaluation;     Clinical Decision Making  Low    Rehab Potential  Good    Clinical Impairments Affecting Rehab Potential  motivated, has good caregiver support;     PT Frequency  2x / week    PT Duration  6 weeks    PT Treatment/Interventions  ADLs/Self Care Home Management;Aquatic Therapy;Cryotherapy;Electrical Stimulation;Moist  Heat;Therapeutic exercise;Therapeutic activities;Functional mobility training;Stair training;Gait training;Balance training;Neuromuscular re-education;Patient/family education;Taping;Energy conservation;Dry needling;Passive range of motion    PT Next Visit Plan  work on balance, strengthening;     PT Home Exercise Plan  initiated- see patient instructions;     Consulted and Agree with Plan of Care  Patient       Patient will benefit from skilled therapeutic intervention in order to improve the following deficits and impairments:  Decreased endurance, Obesity, Decreased knowledge of precautions, Decreased activity tolerance, Decreased strength, Pain, Decreased balance, Decreased mobility, Difficulty walking, Decreased safety awareness  Visit Diagnosis: Muscle weakness (generalized)  Difficulty in walking, not elsewhere classified  Chronic pain of left knee     Problem List Patient Active Problem List   Diagnosis Date Noted  . Limited mobility 09/03/2017  . Fatigue 02/04/2016  . Palpitations 02/04/2016  . Gout 09/24/2015  . Hyperuricemia 08/20/2015  . Back pain 06/28/2015  . Constipation 06/28/2015  . Eczema 06/28/2015  . Edema 06/28/2015  . Pre-diabetes 06/28/2015  . Intertrigo 06/28/2015  . PAC (premature atrial contraction) 06/28/2015  . Premature heartbeats 06/28/2015  . History of basal cell cancer 02/18/2015  . Fatty infiltration of liver 05/31/2013  . Hypothyroidism 01/15/2011  . Arthropathy of pelvic region and thigh 08/01/2008  . HLD (hyperlipidemia) 03/13/2008  . Arthritis, degenerative 01/26/2007  . Hypertension 12/13/2006  . Personal history of venous thrombosis and embolism 05/24/2006    Reyli Schroth  PT, DPT 10/19/2017, 4:11 PM  [place] MAIN [facility] SERVICES [address] [place], [place], [address] Phone: [phone]   Fax:  [phone]  Name: Kayla Chan MRN: [id number] Date of Birth: February 18, 1932

## 2017-10-21 ENCOUNTER — Ambulatory Visit: Payer: PPO | Admitting: Physical Therapy

## 2017-10-25 ENCOUNTER — Ambulatory Visit: Payer: PPO | Attending: Family Medicine | Admitting: Physical Therapy

## 2017-10-25 ENCOUNTER — Encounter: Payer: Self-pay | Admitting: Physical Therapy

## 2017-10-25 VITALS — BP 158/69 | HR 73

## 2017-10-25 DIAGNOSIS — R262 Difficulty in walking, not elsewhere classified: Secondary | ICD-10-CM | POA: Diagnosis not present

## 2017-10-25 DIAGNOSIS — M25562 Pain in left knee: Secondary | ICD-10-CM | POA: Insufficient documentation

## 2017-10-25 DIAGNOSIS — G8929 Other chronic pain: Secondary | ICD-10-CM | POA: Insufficient documentation

## 2017-10-25 DIAGNOSIS — M6281 Muscle weakness (generalized): Secondary | ICD-10-CM | POA: Insufficient documentation

## 2017-10-25 NOTE — Therapy (Signed)
[address] MAIN [facility] SERVICES [address] [place], [place], [address] Phone: [phone]   Fax:  [phone]  Physical Therapy Treatment  Patient Details  Name: Kayla Chan MRN: [id number] Date of Birth: Sep 02, 1931 Referring Provider: Lelon Huh MD   Encounter Date: 10/25/2017  PT End of Session - 10/25/17 1353    Visit Number  2    Number of Visits  13    Date for PT Re-Evaluation  11/30/17    PT Start Time  1324    PT Stop Time  1429    PT Time Calculation (min)  42 min    Activity Tolerance  Patient tolerated treatment well;No increased pain    Behavior During Therapy  WFL for tasks assessed/performed       Past Medical History:  Diagnosis Date  . Cancer ([place])    BCC on nose  . Hypertension     Past Surgical History:  Procedure Laterality Date  . 24 hour Holter Monitor  01/2013   4550 ectopic beats with 344 superventricular bigemminy events  . Abdominal Ultrasound  05/31/2013   RUQ. Fatty Liver  . APPENDECTOMY  1950   [place]  . 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, [place]; Right    Vitals:   10/25/17 1354  BP: (!) 158/69  Pulse: 73  SpO2: 97%    Subjective Assessment - 10/25/17 1354    Subjective  Pt reports she is doing well.  No new complaints.  Pt has been completing only half of her HEP because she has been busy this past week.      Pertinent History  84 yo Female reports chronic knee pain (L>R) and weakness in both legs. She has had TKR (5-10 years ago each leg); Patient is hard of hearing; She presents to therapy with [facility] and states that she has been using it for last 3-4 years; She reports weakness off/on for last few years. Patient has numbness in left knee; She denies any numbness anywhere else; She denies any recent falls; She reports that when inside the house she does not use the cane, but will hold onto furniture;     How long can you sit comfortably?  NA- able to sit  as long as needed;     How long can you stand comfortably?  Pt reports that she does all her housework- will stand 3-4 hours;     How long can you walk comfortably?  <500 feet;     Diagnostic tests  Reports recent x-rays- states that left ankle showed a spur; everything looked okay in the knees- MD states that she did not need surgery per patient;     Patient Stated Goals  "loosen up, be able to walk better."     Currently in Pain?  No/denies        TREATMENT  Sit<>stand x10 without use of UEs. No sign of instability.  Mini squats x10 with cue to with BUEs supported on // bars. Cues for greater hip hinge and to widen BOS to decrease pressure on Bil knees.  BLE bridges in hooklying with cues to raise hips higher and to squeeze buttocks for greater glute recruitement.  Supine SLR x10 each LE with cues to engage core throughout  Seated Bil LAQ with GTB with cues for proper position sitting EOB for greater quad recruitment  Resisted walking at [employer] x1 each direction forward/back/right. 7.5# Cues for eccentric control when returning to start. Was  unable to complete to the left due to her shoe slipping off as well as fatigue.  Marching in sitting with GTB around knees. x15 each LE.  Pt requires multiple seated rest breaks throughout session due to fatigue. Encouraged pt to wear shoes that have a supportive back so that she will not slip out.                        PT Education - 10/25/17 1353    Education Details  Exercise technique; emphasized importance of completing her HEP at least 4x/wk.     Person(s) Educated  Patient    Methods  Demonstration;Explanation;Verbal cues    Comprehension  Verbalized understanding;Returned demonstration;Verbal cues required;Need further instruction       PT Short Term Goals - 10/19/17 1608      PT SHORT TERM GOAL #1   Title  Patient will be adherent to HEP at least 3x a week to improve functional strength and balance for better safety  at home.    Time  4    Period  Weeks    Status  New    Target Date  11/16/17      PT SHORT TERM GOAL #2   Title  Patient will increase 10 meter walk test to >1.37m/s as to improve gait speed for better community ambulation and to reduce fall risk.    Time  4    Period  Weeks    Status  New    Target Date  11/16/17      PT SHORT TERM GOAL #3   Title  Patient will increase BLE gross strength to 4+/5 as to improve functional strength for independent gait, increased standing tolerance and increased ADL ability.    Time  4    Period  Weeks    Status  New    Target Date  11/16/17        PT Long Term Goals - 10/19/17 1609      PT LONG TERM GOAL #1   Title  Patient will be independent in home exercise program to improve strength/mobility for better functional independence with ADLs.    Time  6    Period  Weeks    Status  New    Target Date  11/30/17      PT LONG TERM GOAL #2   Title  Patient will ascend/descend 4 stairs with 1 rail assist independently forward reciprocally, without loss of balance to improve ability to get in/out of home.     Time  6    Period  Weeks    Status  New    Target Date  11/30/17      PT LONG TERM GOAL #3   Title  Patient will increase lower extremity functional scale to >60/80 to demonstrate improved functional mobility and increased tolerance with ADLs.     Time  6    Period  Weeks    Status  New    Target Date  11/30/17      PT LONG TERM GOAL #4   Title  Patient will report a worst pain of 3/10 on VAS in   each knee          to improve tolerance with ADLs and reduced symptoms with activities.     Time  6    Period  Weeks    Status  New    Target Date  11/30/17  Plan - 10/25/17 1356    Clinical Impression Statement  Pt demonstrates BLE weakness, specifically noticeable in Bil hip flexors and glutes this session.  Focused interventions on addressing these muscles.  Pt requires cues for proper technique when performing exercises  for greater muscular recruitement.  Pt will benefit from continued skilled PT interventions for improved strength and balance.      Rehab Potential  Good    Clinical Impairments Affecting Rehab Potential  motivated, has good caregiver support;     PT Frequency  2x / week    PT Duration  6 weeks    PT Treatment/Interventions  ADLs/Self Care Home Management;Aquatic Therapy;Cryotherapy;Electrical Stimulation;Moist Heat;Therapeutic exercise;Therapeutic activities;Functional mobility training;Stair training;Gait training;Balance training;Neuromuscular re-education;Patient/family education;Taping;Energy conservation;Dry needling;Passive range of motion    PT Next Visit Plan  work on balance, strengthening;     PT Home Exercise Plan  initiated- see patient instructions;     Consulted and Agree with Plan of Care  Patient       Patient will benefit from skilled therapeutic intervention in order to improve the following deficits and impairments:  Decreased endurance, Obesity, Decreased knowledge of precautions, Decreased activity tolerance, Decreased strength, Pain, Decreased balance, Decreased mobility, Difficulty walking, Decreased safety awareness  Visit Diagnosis: Muscle weakness (generalized)  Difficulty in walking, not elsewhere classified  Chronic pain of left knee     Problem List Patient Active Problem List   Diagnosis Date Noted  . Limited mobility 09/03/2017  . Fatigue 02/04/2016  . Palpitations 02/04/2016  . Gout 09/24/2015  . Hyperuricemia 08/20/2015  . Back pain 06/28/2015  . Constipation 06/28/2015  . Eczema 06/28/2015  . Edema 06/28/2015  . Pre-diabetes 06/28/2015  . Intertrigo 06/28/2015  . PAC (premature atrial contraction) 06/28/2015  . Premature heartbeats 06/28/2015  . History of basal cell cancer 02/18/2015  . Fatty infiltration of liver 05/31/2013  . Hypothyroidism 01/15/2011  . Arthropathy of pelvic region and thigh 08/01/2008  . HLD (hyperlipidemia)  03/13/2008  . Arthritis, degenerative 01/26/2007  . Hypertension 12/13/2006  . Personal history of venous thrombosis and embolism 05/24/2006    Collie Siad PT, DPT 10/25/2017, 2:29 PM  [place] MAIN [facility] SERVICES [address] [place], [place], [address] Phone: [phone]   Fax:  [phone]  Name: Lavana Huckeba MRN: [id number] Date of Birth: 1932-05-27

## 2017-10-26 ENCOUNTER — Other Ambulatory Visit: Payer: Self-pay

## 2017-10-27 ENCOUNTER — Ambulatory Visit: Payer: PPO

## 2017-10-27 VITALS — BP 188/93 | HR 81

## 2017-10-27 DIAGNOSIS — M6281 Muscle weakness (generalized): Secondary | ICD-10-CM

## 2017-10-27 DIAGNOSIS — R262 Difficulty in walking, not elsewhere classified: Secondary | ICD-10-CM

## 2017-10-27 DIAGNOSIS — G8929 Other chronic pain: Secondary | ICD-10-CM

## 2017-10-27 DIAGNOSIS — M25562 Pain in left knee: Secondary | ICD-10-CM

## 2017-10-27 NOTE — Therapy (Signed)
[place] MAIN [facility] SERVICES [address] [place], [place], [address] Phone: [phone]   Fax:  [phone]  Physical Therapy Treatment  Patient Details  Name: Kayla Chan MRN: [id number] Date of Birth: August 15, 1932 Referring Provider: Lelon Huh MD   Encounter Date: 10/27/2017  PT End of Session - 10/27/17 1422    Visit Number  3    Number of Visits  13    Date for PT Re-Evaluation  11/30/17    PT Start Time  2202    PT Stop Time  1425    PT Time Calculation (min)  37 min    Activity Tolerance  Patient tolerated treatment well;No increased pain    Behavior During Therapy  WFL for tasks assessed/performed       Past Medical History:  Diagnosis Date  . Cancer ([place])    BCC on nose  . Hypertension     Past Surgical History:  Procedure Laterality Date  . 24 hour Holter Monitor  01/2013   4550 ectopic beats with 344 superventricular bigemminy events  . Abdominal Ultrasound  05/31/2013   RUQ. Fatty Liver  . APPENDECTOMY  1950   [place]  . 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, [place]; Right    Vitals:   10/27/17 1354  BP: (!) 188/93  Pulse: 81  SpO2: 96%    Subjective Assessment - 10/27/17 1351    Subjective  Pt reports she is doing well.  No new complaints. She reports that she is performing her HEP. Pt requests to have her blood pressure checked upon arrival.     Pertinent History  84 yo Female reports chronic knee pain (L>R) and weakness in both legs. She has had TKR (5-10 years ago each leg); Patient is hard of hearing; She presents to therapy with [facility] and states that she has been using it for last 3-4 years; She reports weakness off/on for last few years. Patient has numbness in left knee; She denies any numbness anywhere else; She denies any recent falls; She reports that when inside the house she does not use the cane, but will hold onto furniture;     How long can you sit comfortably?   NA- able to sit as long as needed;     How long can you stand comfortably?  Pt reports that she does all her housework- will stand 3-4 hours;     How long can you walk comfortably?  <500 feet;     Diagnostic tests  Reports recent x-rays- states that left ankle showed a spur; everything looked okay in the knees- MD states that she did not need surgery per patient;     Patient Stated Goals  "loosen up, be able to walk better."     Currently in Pain?  No/denies           TREATMENT   Pt initially with high BP in sitting. Pt allowed to lay down for 5 minutes and then BP manually rechecked and found to be 168/90. Elected to proceed with PT session with lower level strengthening exercises.   Ther-ex  BLE bridges in hooklying with cues to raise hips higher and to squeeze buttocks 2 x 15; Supine SLR 2 x 10 bilateral with cues to engage core throughout; Hooklying manually resisted clams 2 x 15; Hooklying manually resisted hip adduction 2 x 15; Seated Bil LAQ with manual resistance 2 x 15 bilateral; Marching in sitting with manual resistance 2 x  15 bilateral.                      PT Education - 10/27/17 1353    Education provided  Yes    Education Details  exercise technique    Person(s) Educated  Patient    Methods  Explanation    Comprehension  Verbalized understanding       PT Short Term Goals - 10/19/17 1608      PT SHORT TERM GOAL #1   Title  Patient will be adherent to HEP at least 3x a week to improve functional strength and balance for better safety at home.    Time  4    Period  Weeks    Status  New    Target Date  11/16/17      PT SHORT TERM GOAL #2   Title  Patient will increase 10 meter walk test to >1.74m/s as to improve gait speed for better community ambulation and to reduce fall risk.    Time  4    Period  Weeks    Status  New    Target Date  11/16/17      PT SHORT TERM GOAL #3   Title  Patient will increase BLE gross strength to 4+/5 as to  improve functional strength for independent gait, increased standing tolerance and increased ADL ability.    Time  4    Period  Weeks    Status  New    Target Date  11/16/17        PT Long Term Goals - 10/19/17 1609      PT LONG TERM GOAL #1   Title  Patient will be independent in home exercise program to improve strength/mobility for better functional independence with ADLs.    Time  6    Period  Weeks    Status  New    Target Date  11/30/17      PT LONG TERM GOAL #2   Title  Patient will ascend/descend 4 stairs with 1 rail assist independently forward reciprocally, without loss of balance to improve ability to get in/out of home.     Time  6    Period  Weeks    Status  New    Target Date  11/30/17      PT LONG TERM GOAL #3   Title  Patient will increase lower extremity functional scale to >60/80 to demonstrate improved functional mobility and increased tolerance with ADLs.     Time  6    Period  Weeks    Status  New    Target Date  11/30/17      PT LONG TERM GOAL #4   Title  Patient will report a worst pain of 3/10 on VAS in   each knee          to improve tolerance with ADLs and reduced symptoms with activities.     Time  6    Period  Weeks    Status  New    Target Date  11/30/17            Plan - 10/27/17 1422    Clinical Impression Statement  Pt requests to have her BP checked upon arrival. Her blood pressure is significantly elevated so pt allowed to lay down for 5 minutes and then BP manually rechecked and found to be 168/90. Elected to proceed with PT session with lower level strengthening exercises. Pt encouraged to monitor  BP at home and keep a log. Instructed to notify MD if BP remains elevated. Pt agrees. She is able to complete all supine and seated exercises as instructed but with cues for proper form/technique. Pt encouraged to continue HEP and follow-up as scheduled.     Rehab Potential  Good    Clinical Impairments Affecting Rehab Potential   motivated, has good caregiver support;     PT Frequency  2x / week    PT Duration  6 weeks    PT Treatment/Interventions  ADLs/Self Care Home Management;Aquatic Therapy;Cryotherapy;Electrical Stimulation;Moist Heat;Therapeutic exercise;Therapeutic activities;Functional mobility training;Stair training;Gait training;Balance training;Neuromuscular re-education;Patient/family education;Taping;Energy conservation;Dry needling;Passive range of motion    PT Next Visit Plan  work on balance, strengthening;     PT Home Exercise Plan  initiated- see patient instructions;     Consulted and Agree with Plan of Care  Patient       Patient will benefit from skilled therapeutic intervention in order to improve the following deficits and impairments:  Decreased endurance, Obesity, Decreased knowledge of precautions, Decreased activity tolerance, Decreased strength, Pain, Decreased balance, Decreased mobility, Difficulty walking, Decreased safety awareness  Visit Diagnosis: Muscle weakness (generalized)  Difficulty in walking, not elsewhere classified  Chronic pain of left knee     Problem List Patient Active Problem List   Diagnosis Date Noted  . Limited mobility 09/03/2017  . Fatigue 02/04/2016  . Palpitations 02/04/2016  . Gout 09/24/2015  . Hyperuricemia 08/20/2015  . Back pain 06/28/2015  . Constipation 06/28/2015  . Eczema 06/28/2015  . Edema 06/28/2015  . Pre-diabetes 06/28/2015  . Intertrigo 06/28/2015  . PAC (premature atrial contraction) 06/28/2015  . Premature heartbeats 06/28/2015  . History of basal cell cancer 02/18/2015  . Fatty infiltration of liver 05/31/2013  . Hypothyroidism 01/15/2011  . Arthropathy of pelvic region and thigh 08/01/2008  . HLD (hyperlipidemia) 03/13/2008  . Arthritis, degenerative 01/26/2007  . Hypertension 12/13/2006  . Personal history of venous thrombosis and embolism 05/24/2006   Phillips Grout PT, DPT   Malin Sambrano 10/27/2017, 2:29 PM  [place] MAIN [facility] SERVICES [address] [place], [place], [address] Phone: [phone]   Fax:  [phone]  Name: Attallah Ontko MRN: [id number] Date of Birth: 27-Sep-1931

## 2017-10-29 ENCOUNTER — Encounter: Payer: Self-pay | Admitting: Family Medicine

## 2017-10-29 ENCOUNTER — Ambulatory Visit (INDEPENDENT_AMBULATORY_CARE_PROVIDER_SITE_OTHER): Payer: PPO | Admitting: Family Medicine

## 2017-10-29 VITALS — BP 162/74 | HR 74 | Temp 97.6°F | Resp 16

## 2017-10-29 DIAGNOSIS — I10 Essential (primary) hypertension: Secondary | ICD-10-CM

## 2017-10-29 MED ORDER — CHLORTHALIDONE 25 MG PO TABS: 25.0000 mg | ORAL_TABLET | Freq: Every day | ORAL | 1 refills | Status: DC

## 2017-10-29 NOTE — Progress Notes (Signed)
Patient: Kayla Chan Female    DOB: 1932-04-09   84 y.o.   MRN: [id number] Visit Date: 10/29/2017  Today's Provider: Lelon Huh, MD   Chief Complaint  Patient presents with  . Hypertension   Subjective:    HPI Patient reports that her BP has been gradually getting high over the last month. She reports that on Wednesday (2 days ago) during physical therapy, her BP was up to 180/90. She reports that since then her readings at home is 160/90s. Patient also mentions that she has occasional dizzy spells. She states she start to notice this after getting out of the shower in the morning. Is usually triggered by looking down then up. No spinning sensation.  She is currently taking Lisinopril in the mornings and amlodipine in the evenings.   BP Readings from Last 5 Encounters:  10/29/17 (!) 162/74  10/27/17 (!) 188/93  10/25/17 (!) 158/69  09/03/17 140/80  09/03/17 (!) 162/88       Allergies  Allergen Reactions  . Atorvastatin     Other reaction(s): Muscle Pain  . Colestid  [Colestipol Hcl]     Itching, insomnia  . Crestor  [Rosuvastatin Calcium]     Other reaction(s): Muscle Pain  . Doxycycline   . Fluvastatin Sodium     Other reaction(s): Muscle Pain  . Furosemide     Severe Cramping  . Penicillins   . Pravastatin Sodium     Numbness in legs  . Verapamil     Shortness of breath, swelling, palpations.      Current Outpatient Medications:  .  amLODipine (NORVASC) 2.5 MG tablet, Take one tablet every evening, Disp: 90 tablet, Rfl: 3 .  aspirin 81 MG tablet, Take 81 mg by mouth daily., Disp: , Rfl:  .  diclofenac (CATAFLAM) 50 MG tablet, TAKE 1 TABLET (50 MG TOTAL) BY MOUTH 2 (TWO) TIMES DAILY AS NEEDED (FOOT PAIN)., Disp: 30 tablet, Rfl: 2 .  ezetimibe (ZETIA) 10 MG tablet, TAKE 1 TABLET BY MOUTH IN THE MORNING, Disp: 30 tablet, Rfl: 12 .  levothyroxine (SYNTHROID, LEVOTHROID) 75 MCG tablet, TAKE 1 TABLET BY MOUTH EVERY DAY, Disp: 90 tablet, Rfl: 4 .   lisinopril (PRINIVIL,ZESTRIL) 5 MG tablet, Take 1 tablet (5 mg total) by mouth daily., Disp: 1 tablet, Rfl: 1 .  mometasone (ELOCON) 0.1 % ointment, Apply 1 application daily as needed topically. Apply to affected area, Disp: 15 g, Rfl: 5 .  Multiple Vitamins-Minerals (MULTIVITAMIN ADULT PO), Take 1 tablet by mouth daily., Disp: , Rfl:  .  furosemide (LASIX) 20 MG tablet, Take 1 tablet (20 mg total) by mouth daily as needed for edema. (Patient not taking: Reported on 09/03/2017), Disp: 30 tablet, Rfl: 5  Review of Systems  Constitutional: Positive for fatigue.  Respiratory: Negative for cough, shortness of breath and wheezing.   Cardiovascular: Negative for chest pain and leg swelling.  Neurological: Positive for dizziness and headaches. Negative for weakness, light-headedness and numbness.    Social History   Tobacco Use  . Smoking status: Never Smoker  . Smokeless tobacco: Never Used  Substance Use Topics  . Alcohol use: Yes    Alcohol/week: 0.0 oz    Comment: 1 glass of wine occasionally   Objective:   BP (!) 162/74 (BP Location: Left Arm, Patient Position: Sitting, Cuff Size: Large)   Pulse 74   Temp 97.6 F (36.4 C)   Resp 16   SpO2 96%  Vitals:   10/29/17 0944  BP: (!) 162/74  Pulse: 74  Resp: 16  Temp: 97.6 F (36.4 C)  SpO2: 96%     Physical Exam   General Appearance:    Alert, cooperative, no distress  Eyes:    PERRL, conjunctiva/corneas clear, EOM's intact       Lungs:     Clear to auscultation bilaterally, respirations unlabored  Heart:    Regular rate and rhythm  Neurologic:   Awake, alert, oriented x 3. No apparent focal neurological           defect.           Assessment & Plan:     1. Essential hypertension  - chlorthalidone (HYGROTON) 25 MG tablet; Take 1 tablet (25 mg total) by mouth daily.  Dispense: 30 tablet; Refill: 1       Lelon Huh, MD  [facility]

## 2017-11-01 ENCOUNTER — Ambulatory Visit: Payer: PPO | Admitting: Physical Therapy

## 2017-11-02 ENCOUNTER — Telehealth: Payer: Self-pay | Admitting: Family Medicine

## 2017-11-02 NOTE — Telephone Encounter (Signed)
Patient states that she was on lisinopril (PRINIVIL,ZESTRIL) 5 MG tablet already and then you put her on chlorthalidone (HYGROTON) 25 MG tablet and she doesn't know if she is supposed to be taking both of these medications or just the new one that you put her on.  She also states that she had really bad joint pain last night and wants to know if it is a side effect of the new medication.

## 2017-11-02 NOTE — Telephone Encounter (Signed)
Please advise 

## 2017-11-02 NOTE — Telephone Encounter (Signed)
She is supposed to take both. Try taking 1/2 tablet of chlorthalidone per day to reduce risk of side effects.

## 2017-11-02 NOTE — Telephone Encounter (Signed)
LMOVM for pt to return call 

## 2017-11-03 ENCOUNTER — Encounter: Payer: PPO | Admitting: Physical Therapy

## 2017-11-03 NOTE — Telephone Encounter (Signed)
Pt stated she was returning Michelle's call and request call back. Please advise. Thanks TNP

## 2017-11-03 NOTE — Telephone Encounter (Signed)
Patient was advised to continue taking lisinopril 5 mg qd and amlodipine 2.5 qhs. Also try taking 1/2 tablet of  Chlorthalidone. Patient expressed understanding.

## 2017-11-08 ENCOUNTER — Ambulatory Visit: Payer: PPO | Admitting: Physical Therapy

## 2017-11-10 ENCOUNTER — Encounter: Payer: PPO | Admitting: Physical Therapy

## 2017-11-15 ENCOUNTER — Ambulatory Visit: Payer: PPO | Admitting: Physical Therapy

## 2017-11-17 ENCOUNTER — Encounter: Payer: PPO | Admitting: Physical Therapy

## 2017-11-18 ENCOUNTER — Ambulatory Visit (INDEPENDENT_AMBULATORY_CARE_PROVIDER_SITE_OTHER): Payer: PPO | Admitting: Family Medicine

## 2017-11-18 ENCOUNTER — Encounter: Payer: Self-pay | Admitting: Family Medicine

## 2017-11-18 VITALS — BP 142/68 | HR 74 | Temp 98.4°F | Resp 16 | Wt 240.0 lb

## 2017-11-18 DIAGNOSIS — I10 Essential (primary) hypertension: Secondary | ICD-10-CM

## 2017-11-18 MED ORDER — CHLORTHALIDONE 25 MG PO TABS: 12.5000 mg | ORAL_TABLET | Freq: Every day | ORAL | 0 refills | Status: DC

## 2017-11-18 NOTE — Progress Notes (Signed)
Patient: Kayla Chan Female    DOB: [birth year]   84 y.o.   MRN: [id number] Visit Date: 11/18/2017  Today's Provider: Lelon Huh, MD   Chief Complaint  Patient presents with  . Hypertension   Subjective:    HPI  Hypertension, follow-up:  BP Readings from Last 3 Encounters:  11/18/17 (!) 142/68  10/29/17 (!) 162/74  10/27/17 (!) 188/93    She was last seen for hypertension 3 weeks ago.  BP at that visit was 162/74. Management since that visit includes adding chlorthalidone 25mg  daily. She reports that she has been taking 1/2 tablet of the chlorthalidone.  She reports good compliance with treatment. She is having side effects. She reports that she has dizzy spells at night.   She is not exercising. She is adherent to low salt diet.   Outside blood pressures are checked daily.  Patient denies exertional chest pressure/discomfort and palpitations.   Cardiovascular risk factors include dyslipidemia.   Weight trend: stable Wt Readings from Last 3 Encounters:  11/18/17 240 lb (108.9 kg)  09/03/17 245 lb 12.8 oz (111.5 kg)  08/06/17 245 lb (111.1 kg)    Current diet: well balanced    Allergies  Allergen Reactions  . Atorvastatin     Other reaction(s): Muscle Pain  . Colestid  [Colestipol Hcl]     Itching, insomnia  . Crestor  [Rosuvastatin Calcium]     Other reaction(s): Muscle Pain  . Doxycycline   . Fluvastatin Sodium     Other reaction(s): Muscle Pain  . Furosemide     Severe Cramping  . Penicillins   . Pravastatin Sodium     Numbness in legs  . Verapamil     Shortness of breath, swelling, palpations.      Current Outpatient Medications:  .  amLODipine (NORVASC) 2.5 MG tablet, Take one tablet every evening, Disp: 90 tablet, Rfl: 3 .  aspirin 81 MG tablet, Take 81 mg by mouth daily., Disp: , Rfl:  .  chlorthalidone (HYGROTON) 25 MG tablet, Take 1 tablet (25 mg total) by mouth daily. (Patient taking differently: Take 12.5 mg by mouth daily.  ), Disp: 30 tablet, Rfl: 1 .  diclofenac (CATAFLAM) 50 MG tablet, TAKE 1 TABLET (50 MG TOTAL) BY MOUTH 2 (TWO) TIMES DAILY AS NEEDED (FOOT PAIN)., Disp: 30 tablet, Rfl: 2 .  ezetimibe (ZETIA) 10 MG tablet, TAKE 1 TABLET BY MOUTH IN THE MORNING, Disp: 30 tablet, Rfl: 12 .  levothyroxine (SYNTHROID, LEVOTHROID) 75 MCG tablet, TAKE 1 TABLET BY MOUTH EVERY DAY, Disp: 90 tablet, Rfl: 4 .  lisinopril (PRINIVIL,ZESTRIL) 5 MG tablet, Take 1 tablet (5 mg total) by mouth daily., Disp: 1 tablet, Rfl: 1 .  mometasone (ELOCON) 0.1 % ointment, Apply 1 application daily as needed topically. Apply to affected area, Disp: 15 g, Rfl: 5 .  Multiple Vitamins-Minerals (MULTIVITAMIN ADULT PO), Take 1 tablet by mouth daily., Disp: , Rfl:   Review of Systems  Constitutional: Negative.   Respiratory: Negative.   Cardiovascular: Positive for leg swelling. Negative for chest pain and palpitations.  Endocrine: Negative.   Musculoskeletal: Positive for arthralgias, joint swelling and myalgias.  Neurological: Positive for dizziness.    Social History   Tobacco Use  . Smoking status: Never Smoker  . Smokeless tobacco: Never Used  Substance Use Topics  . Alcohol use: Yes    Alcohol/week: 0.0 oz    Comment: 1 glass of wine occasionally   Objective:   BP (!) 142/68 (  BP Location: Right Wrist, Patient Position: Sitting, Cuff Size: Large)   Pulse 74   Temp 98.4 F (36.9 C)   Resp 16   Wt 240 lb (108.9 kg)   SpO2 99%   BMI 48.47 kg/m  Vitals:   11/18/17 1107  BP: (!) 142/68  Pulse: 74  Resp: 16  Temp: 98.4 F (36.9 C)  SpO2: 99%  Weight: 240 lb (108.9 kg)     Physical Exam  General appearance: alert, well developed, well nourished, cooperative and in no distress Head: Normocephalic, without obvious abnormality, atraumatic Respiratory: Respirations even and unlabored, normal respiratory rate Extremities: No gross deformities Skin: Skin color, texture, turgor normal. No rashes seen  Psych:  Appropriate mood and affect. Neurologic: Mental status: Alert, oriented to person, place, and time, thought content appropriate.     Assessment & Plan:     1. Essential hypertension Fairly well controlled, and tolerating low doses of ACEI, CCB and diuretic much better than previous medications. She does get a little dizzy soon after taking amlodipine in the evening and advised to take just before going to bed to minimize side effects.   Follow up 3 months.        Lelon Huh, MD  [facility]

## 2017-11-20 ENCOUNTER — Other Ambulatory Visit: Payer: Self-pay | Admitting: Family Medicine

## 2017-11-20 DIAGNOSIS — I10 Essential (primary) hypertension: Secondary | ICD-10-CM

## 2017-11-22 ENCOUNTER — Ambulatory Visit: Payer: PPO | Admitting: Physical Therapy

## 2017-11-22 DIAGNOSIS — H2511 Age-related nuclear cataract, right eye: Secondary | ICD-10-CM | POA: Diagnosis not present

## 2017-11-24 ENCOUNTER — Encounter: Payer: PPO | Admitting: Physical Therapy

## 2017-11-25 ENCOUNTER — Encounter: Payer: PPO | Admitting: Physical Therapy

## 2017-11-25 NOTE — Telephone Encounter (Signed)
Closed

## 2017-11-27 ENCOUNTER — Other Ambulatory Visit: Payer: Self-pay | Admitting: Family Medicine

## 2017-11-29 ENCOUNTER — Ambulatory Visit: Payer: PPO | Admitting: Physical Therapy

## 2017-12-02 ENCOUNTER — Encounter: Payer: PPO | Admitting: Physical Therapy

## 2017-12-06 ENCOUNTER — Encounter: Payer: PPO | Admitting: Physical Therapy

## 2017-12-09 ENCOUNTER — Encounter: Payer: PPO | Admitting: Physical Therapy

## 2017-12-10 ENCOUNTER — Other Ambulatory Visit: Payer: Self-pay | Admitting: Family Medicine

## 2017-12-10 DIAGNOSIS — I10 Essential (primary) hypertension: Secondary | ICD-10-CM

## 2017-12-13 ENCOUNTER — Ambulatory Visit (INDEPENDENT_AMBULATORY_CARE_PROVIDER_SITE_OTHER): Payer: PPO | Admitting: Family Medicine

## 2017-12-13 ENCOUNTER — Encounter: Payer: Self-pay | Admitting: Family Medicine

## 2017-12-13 VITALS — BP 124/70 | HR 81 | Temp 97.9°F | Resp 18 | Wt 240.0 lb

## 2017-12-13 DIAGNOSIS — N390 Urinary tract infection, site not specified: Secondary | ICD-10-CM

## 2017-12-13 DIAGNOSIS — M545 Low back pain, unspecified: Secondary | ICD-10-CM

## 2017-12-13 DIAGNOSIS — R319 Hematuria, unspecified: Secondary | ICD-10-CM | POA: Diagnosis not present

## 2017-12-13 DIAGNOSIS — L309 Dermatitis, unspecified: Secondary | ICD-10-CM | POA: Diagnosis not present

## 2017-12-13 LAB — POCT URINALYSIS DIPSTICK
Bilirubin, UA: NEGATIVE
Glucose, UA: NEGATIVE
KETONES UA: NEGATIVE
NITRITE UA: NEGATIVE
Protein, UA: NEGATIVE
SPEC GRAV UA: 1.02 (ref 1.010–1.025)
UROBILINOGEN UA: 0.2 U/dL
pH, UA: 5 (ref 5.0–8.0)

## 2017-12-13 MED ORDER — CYCLOBENZAPRINE HCL 5 MG PO TABS: 5.0000 mg | ORAL_TABLET | Freq: Three times a day (TID) | ORAL | 0 refills | Status: DC | PRN

## 2017-12-13 MED ORDER — NITROFURANTOIN MACROCRYSTAL 100 MG PO CAPS: 100.0000 mg | ORAL_CAPSULE | Freq: Two times a day (BID) | ORAL | 0 refills | Status: DC

## 2017-12-13 NOTE — Progress Notes (Signed)
Patient: Kayla Chan Female    DOB: 03/15/1932   84 y.o.   MRN: [id number] Visit Date: 12/13/2017  Today's Provider: Lelon Huh, MD   Chief Complaint  Patient presents with  . Back Pain    x 2 days   Subjective:    Back Pain  This is a new problem. Episode onset: 2 days ago. The problem has been gradually worsening since onset. The pain is present in the lumbar spine (right side). The symptoms are aggravated by sitting. Pertinent negatives include no abdominal pain, chest pain, fever or weakness. She has tried nothing for the symptoms.       Allergies  Allergen Reactions  . Atorvastatin     Other reaction(s): Muscle Pain  . Colestid  [Colestipol Hcl]     Itching, insomnia  . Crestor  [Rosuvastatin Calcium]     Other reaction(s): Muscle Pain  . Doxycycline   . Fluvastatin Sodium     Other reaction(s): Muscle Pain  . Furosemide     Severe Cramping  . Penicillins   . Pravastatin Sodium     Numbness in legs  . Verapamil     Shortness of breath, swelling, palpations.      Current Outpatient Medications:  .  amLODipine (NORVASC) 2.5 MG tablet, Take one tablet every evening, Disp: 90 tablet, Rfl: 3 .  aspirin 81 MG tablet, Take 81 mg by mouth daily., Disp: , Rfl:  .  chlorthalidone (HYGROTON) 25 MG tablet, Take 0.5 tablets (12.5 mg total) by mouth daily., Disp: 30 tablet, Rfl: 3 .  ezetimibe (ZETIA) 10 MG tablet, TAKE 1 TABLET BY MOUTH IN THE MORNING, Disp: 30 tablet, Rfl: 11 .  levothyroxine (SYNTHROID, LEVOTHROID) 75 MCG tablet, TAKE 1 TABLET BY MOUTH EVERY DAY, Disp: 90 tablet, Rfl: 4 .  lisinopril (PRINIVIL,ZESTRIL) 5 MG tablet, TAKE 1 TABLET BY MOUTH EVERY DAY, Disp: 90 tablet, Rfl: 2 .  Multiple Vitamins-Minerals (MULTIVITAMIN ADULT PO), Take 1 tablet by mouth daily., Disp: , Rfl:  .  diclofenac (CATAFLAM) 50 MG tablet, TAKE 1 TABLET (50 MG TOTAL) BY MOUTH 2 (TWO) TIMES DAILY AS NEEDED (FOOT PAIN). (Patient not taking: Reported on 12/13/2017), Disp: 30  tablet, Rfl: 2 .  mometasone (ELOCON) 0.1 % ointment, Apply 1 application daily as needed topically. Apply to affected area (Patient not taking: Reported on 12/13/2017), Disp: 15 g, Rfl: 5  Review of Systems  Constitutional: Negative for appetite change, chills, fatigue and fever.  Respiratory: Negative for chest tightness and shortness of breath.   Cardiovascular: Negative for chest pain and palpitations.  Gastrointestinal: Negative for abdominal pain, nausea and vomiting.  Musculoskeletal: Positive for back pain.  Neurological: Positive for dizziness. Negative for weakness.    Social History   Tobacco Use  . Smoking status: Never Smoker  . Smokeless tobacco: Never Used  Substance Use Topics  . Alcohol use: Yes    Alcohol/week: 0.0 oz    Comment: 1 glass of wine occasionally   Objective:   BP 124/70 (BP Location: Left Wrist, Patient Position: Sitting, Cuff Size: Normal)   Pulse 81   Temp 97.9 F (36.6 C) (Oral)   Resp 18   Wt 240 lb (108.9 kg)   SpO2 96% Comment: room air  BMI 48.47 kg/m  There were no vitals filed for this visit.   Physical Exam   General Appearance:    Alert, cooperative, no distress  Eyes:    PERRL, conjunctiva/corneas clear, EOM's intact  Lungs:     Clear to auscultation bilaterally, respirations unlabored  Heart:    Regular rate and rhythm  Neurologic:   Awake, alert, oriented x 3. No apparent focal neurological           defect.        Results for orders placed or performed in visit on 12/13/17  POCT Urinalysis Dipstick  Result Value Ref Range   Color, UA yellow    Clarity, UA cloudy    Glucose, UA negative    Bilirubin, UA negative    Ketones, UA negative    Spec Grav, UA 1.020 1.010 - 1.025   Blood, UA Moderate (non hemolyzed)    pH, UA 5.0 5.0 - 8.0   Protein, UA negative    Urobilinogen, UA 0.2 0.2 or 1.0 E.U./dL   Nitrite, UA negative    Leukocytes, UA Trace (A) Negative   Appearance     Odor         Assessment & Plan:      1. Urinary tract infection with hematuria, site unspecified  - POCT Urinalysis Dipstick - Urine Culture - nitrofurantoin (MACRODANTIN) 100 MG capsule; Take 1 capsule (100 mg total) by mouth 2 (two) times daily.  Dispense: 14 capsule; Refill: 0  2. Eczema, unspecified type   3. Acute right-sided low back pain without sciatica  - cyclobenzaprine (FLEXERIL) 5 MG tablet; Take 1 tablet (5 mg total) by mouth 3 (three) times daily as needed for up to 10 days for muscle spasms.  Dispense: 30 tablet; Refill: 0  Call if symptoms change or if not rapidly improving.           Lelon Huh, MD  [facility]

## 2017-12-17 ENCOUNTER — Telehealth: Payer: Self-pay | Admitting: Family Medicine

## 2017-12-17 LAB — URINE CULTURE

## 2017-12-17 NOTE — Telephone Encounter (Signed)
Pt advised of cx results. Please start PA for cyclobenzaprine.

## 2017-12-17 NOTE — Telephone Encounter (Signed)
Pt called saying she was in on Monday and had a UC done but has not heard anything back yet.  She also said her medication needs to have a PA on her muscle relaxer per her pharmacy   Her call back is [phone]  Thanks teri

## 2018-01-13 ENCOUNTER — Telehealth: Payer: Self-pay | Admitting: Family Medicine

## 2018-01-13 DIAGNOSIS — R319 Hematuria, unspecified: Principal | ICD-10-CM

## 2018-01-13 DIAGNOSIS — N390 Urinary tract infection, site not specified: Secondary | ICD-10-CM

## 2018-01-13 MED ORDER — NITROFURANTOIN MACROCRYSTAL 100 MG PO CAPS: 100.0000 mg | ORAL_CAPSULE | Freq: Two times a day (BID) | ORAL | 0 refills | Status: DC

## 2018-01-13 NOTE — Telephone Encounter (Signed)
Pt was in a couple weeks ago with a UTI.  She took the antibiotics and it seemed to clear up but she is starting to have symptoms.  Frequent Urination.  She has an appt on the 15th so she does not want to come in now.  She wants to know if she can get a refill on the antibiotic  [facility] [address]  [place]

## 2018-01-13 NOTE — Telephone Encounter (Signed)
Please advise 

## 2018-02-04 ENCOUNTER — Telehealth: Payer: Self-pay

## 2018-02-04 NOTE — Telephone Encounter (Signed)
Patient wanted to let Dr.Fisher know that every time she takes the Amlodipine she gets dizzy. She states it makes her feel drunk and she is unable to stand straight. Patient states that she is only taking 1/2 tablet of 2.5mg  of Amlodipine at night. This dizziness has been going on for 2 weeks. She wants to stop taking the Amlodipine. Her blood pressure has been stable and is averaging around 133/75.

## 2018-02-07 ENCOUNTER — Telehealth: Payer: Self-pay | Admitting: Family Medicine

## 2018-02-07 MED ORDER — DOXAZOSIN MESYLATE 1 MG PO TABS: 1.0000 mg | ORAL_TABLET | Freq: Every day | ORAL | 3 refills | Status: DC

## 2018-02-07 NOTE — Telephone Encounter (Signed)
CAN STOP AMLODIPINE AND TRY DOXAZOSIN 1MG , 1 TABLET DAILY, #30, RF 3

## 2018-02-07 NOTE — Telephone Encounter (Signed)
Patient was advised. Expressed understanding.  

## 2018-02-07 NOTE — Telephone Encounter (Signed)
Patient called to give an update on her BP after stopping her Amlodipine days ago. Patient is taking Lisinopril 5 mg tablet and Chlorthalidone 1/2 tablet daily. Patient reports her BP today is 137/81 and is not feeling dizzy at all.

## 2018-02-15 ENCOUNTER — Other Ambulatory Visit: Payer: Self-pay | Admitting: Family Medicine

## 2018-02-23 ENCOUNTER — Ambulatory Visit: Payer: Self-pay | Admitting: Family Medicine

## 2018-03-07 ENCOUNTER — Encounter: Payer: Self-pay | Admitting: Family Medicine

## 2018-03-07 ENCOUNTER — Ambulatory Visit (INDEPENDENT_AMBULATORY_CARE_PROVIDER_SITE_OTHER): Payer: PPO | Admitting: Family Medicine

## 2018-03-07 VITALS — BP 118/70 | HR 91 | Temp 98.4°F | Resp 16 | Wt 241.0 lb

## 2018-03-07 DIAGNOSIS — I10 Essential (primary) hypertension: Secondary | ICD-10-CM

## 2018-03-07 DIAGNOSIS — M545 Low back pain, unspecified: Secondary | ICD-10-CM

## 2018-03-07 DIAGNOSIS — R7303 Prediabetes: Secondary | ICD-10-CM | POA: Diagnosis not present

## 2018-03-07 DIAGNOSIS — E785 Hyperlipidemia, unspecified: Secondary | ICD-10-CM

## 2018-03-07 DIAGNOSIS — E039 Hypothyroidism, unspecified: Secondary | ICD-10-CM

## 2018-03-07 DIAGNOSIS — R42 Dizziness and giddiness: Secondary | ICD-10-CM | POA: Diagnosis not present

## 2018-03-07 LAB — POCT URINALYSIS DIPSTICK
GLUCOSE UA: NEGATIVE
KETONES UA: NEGATIVE
Leukocytes, UA: NEGATIVE
Nitrite, UA: NEGATIVE
PROTEIN UA: NEGATIVE
SPEC GRAV UA: 1.025 (ref 1.010–1.025)
Urobilinogen, UA: 0.2 E.U./dL
pH, UA: 6 (ref 5.0–8.0)

## 2018-03-07 MED ORDER — AMLODIPINE BESYLATE 2.5 MG PO TABS: ORAL_TABLET | ORAL | 3 refills | Status: DC

## 2018-03-07 MED ORDER — COLCHICINE 0.6 MG PO TABS: 0.6000 mg | ORAL_TABLET | Freq: Two times a day (BID) | ORAL | 1 refills | Status: DC | PRN

## 2018-03-07 NOTE — Progress Notes (Signed)
Patient: Kayla Chan Female    DOB: Jul 02, 1932   84 y.o.   MRN: [id number] Visit Date: 03/07/2018  Today's Provider: Lelon Huh, MD   Chief Complaint  Patient presents with  . Hypertension  . Hyperglycemia  . Hyperlipidemia  . Hypothyroidism   Subjective:    HPI   Hypertension, follow-up:  BP Readings from Last 3 Encounters:  03/07/18 118/70  12/13/17 124/70  11/18/17 (!) 142/68    She was last seen for hypertension 4 months ago.  BP at that visit was 162/74. Management since that visit includes; started chlorthalidone 25 mg qd. Changes since last visit, discontinued amlodipine and started doxazosin 1 mg qd due to patient having dizziness while taking amlodipine.She reports fair compliance with treatment. Patient states the Doxazosin made her feet itch, so she stopped taking it. She is not having side effects.  She is not exercising. She is adherent to low salt diet.   Outside blood pressures are 110/50. She is experiencing none.  Patient denies chest pain, chest pressure/discomfort, claudication, dyspnea, exertional chest pressure/discomfort, fatigue, irregular heart beat, lower extremity edema, near-syncope, orthopnea, palpitations, paroxysmal nocturnal dyspnea, syncope and tachypnea.   Cardiovascular risk factors include advanced age (older than 37 for men, 6 for women), dyslipidemia and hypertension.  Use of agents associated with hypertension: NSAIDS and thyroid hormones.   ------------------------------------------------------------------------    Lipid/Cholesterol, Follow-up:   Last seen for this 6 months ago.  Management since that visit includes; labs checked. Recommended cutting back on saturated fats.  Last Lipid Panel:    Component Value Date/Time   CHOL 237 (H) 09/03/2017 1107   TRIG 238 (H) 09/03/2017 1107   HDL 50 09/03/2017 1107   CHOLHDL 4.7 (H) 09/03/2017 1107   LDLCALC 139 (H) 09/03/2017 1107    She reports good compliance  with treatment. She is not having side effects.   Wt Readings from Last 3 Encounters:  03/07/18 241 lb (109.3 kg)  12/13/17 240 lb (108.9 kg)  11/18/17 240 lb (108.9 kg)    ------------------------------------------------------------------------  Hypothyroidism, unspecified type From 09/03/2017-labs checked, no changes. Patient reports good compliance with treatment. Lab Results  Component Value Date   TSH 2.140 09/03/2017     Pre-diabetes From 09/03/2017-labs checked, no changes. Hemoglobin A1c 6.2.  She states she still has dizziness which is aggravated by turning her head, but states it is not a spinning sensation, rather that she feels 'woozy' for a few minutes when changing positions or rapidly turning head.     Allergies  Allergen Reactions  . Atorvastatin     Other reaction(s): Muscle Pain  . Colestid  [Colestipol Hcl]     Itching, insomnia  . Crestor  [Rosuvastatin Calcium]     Other reaction(s): Muscle Pain  . Doxycycline   . Fluvastatin Sodium     Other reaction(s): Muscle Pain  . Furosemide     Severe Cramping  . Penicillins   . Pravastatin Sodium     Numbness in legs  . Verapamil     Shortness of breath, swelling, palpations.      Current Outpatient Medications:  .  aspirin 81 MG tablet, Take 81 mg by mouth daily., Disp: , Rfl:  .  chlorthalidone (HYGROTON) 25 MG tablet, Take 0.5 tablets (12.5 mg total) by mouth daily., Disp: 30 tablet, Rfl: 3 .  ezetimibe (ZETIA) 10 MG tablet, TAKE 1 TABLET BY MOUTH IN THE MORNING, Disp: 30 tablet, Rfl: 11 .  levothyroxine (SYNTHROID, LEVOTHROID) 75  MCG tablet, TAKE 1 TABLET BY MOUTH EVERY DAY, Disp: 90 tablet, Rfl: 4 .  lisinopril (PRINIVIL,ZESTRIL) 5 MG tablet, TAKE 1 TABLET BY MOUTH EVERY DAY, Disp: 90 tablet, Rfl: 2 .  mometasone (ELOCON) 0.1 % ointment, Apply 1 application daily as needed topically. Apply to affected area, Disp: 15 g, Rfl: 5 .  Multiple Vitamins-Minerals (MULTIVITAMIN ADULT PO), Take 1 tablet by  mouth daily., Disp: , Rfl:  .  amLODipine (NORVASC) 2.5 MG tablet, Take one tablet every evening (Patient not taking: Reported on 03/07/2018), Disp: 90 tablet, Rfl: 3 .  doxazosin (CARDURA) 1 MG tablet, Take 1 tablet (1 mg total) by mouth daily. (Patient not taking: Reported on 03/07/2018), Disp: 30 tablet, Rfl: 3  Review of Systems  Constitutional: Negative for appetite change, chills, fatigue and fever.  Respiratory: Negative for chest tightness and shortness of breath.   Cardiovascular: Negative for chest pain and palpitations.  Gastrointestinal: Negative for abdominal pain, nausea and vomiting.  Musculoskeletal: Positive for back pain (right lower side of back).  Neurological: Positive for dizziness and numbness (in legs). Negative for weakness.    Social History   Tobacco Use  . Smoking status: Never Smoker  . Smokeless tobacco: Never Used  Substance Use Topics  . Alcohol use: Yes    Alcohol/week: 0.0 oz    Comment: 1 glass of wine occasionally   Objective:   BP 118/70 (BP Location: Left Wrist, Patient Position: Sitting, Cuff Size: Normal)   Pulse 91   Temp 98.4 F (36.9 C) (Oral)   Resp 16   Wt 241 lb (109.3 kg)   SpO2 96% Comment: room air  BMI 48.68 kg/m  Vitals:   03/07/18 1443  BP: 118/70  Pulse: 91  Resp: 16  Temp: 98.4 F (36.9 C)  TempSrc: Oral  SpO2: 96%  Weight: 241 lb (109.3 kg)     Physical Exam   General Appearance:    Alert, cooperative, no distress, obese  Eyes:    PERRL, conjunctiva/corneas clear, EOM's intact       Lungs:     Clear to auscultation bilaterally, respirations unlabored  Heart:    Regular rate and rhythm  Neurologic:   Awake, alert, oriented x 3. No apparent focal neurological           defect.           Assessment & Plan:     1. Pre-diabetes  - Hemoglobin A1c  2. Hyperlipidemia, unspecified hyperlipidemia type Diet controlled.  - Comprehensive metabolic panel - Lipid panel  3. Hypothyroidism, unspecified type  -  TSH  4. Essential hypertension  Better on chlorthalidone, but still having some dizziness. Unclear is this orthostatic or vertiginous.   5. Dizziness  - CBC       Lelon Huh, MD  [facility]

## 2018-03-08 LAB — LIPID PANEL
CHOL/HDL RATIO: 5.6 ratio — AB (ref 0.0–4.4)
Cholesterol, Total: 254 mg/dL — ABNORMAL HIGH (ref 100–199)
HDL: 45 mg/dL (ref >39)
LDL Calculated: 143 mg/dL — ABNORMAL HIGH (ref 0–99)
Triglycerides: 329 mg/dL — ABNORMAL HIGH (ref 0–149)
VLDL Cholesterol Cal: 66 mg/dL — ABNORMAL HIGH (ref 5–40)

## 2018-03-08 LAB — COMPREHENSIVE METABOLIC PANEL
ALBUMIN: 4 g/dL (ref 3.5–4.7)
ALK PHOS: 70 IU/L (ref 39–117)
ALT: 11 IU/L (ref 0–32)
AST: 16 IU/L (ref 0–40)
Albumin/Globulin Ratio: 1.5 (ref 1.2–2.2)
BUN/Creatinine Ratio: 25 (ref 12–28)
BUN: 27 mg/dL (ref 8–27)
Bilirubin Total: 0.3 mg/dL (ref 0.0–1.2)
CO2: 21 mmol/L (ref 20–29)
Calcium: 9.4 mg/dL (ref 8.7–10.3)
Chloride: 104 mmol/L (ref 96–106)
Creatinine, Ser: 1.08 mg/dL — ABNORMAL HIGH (ref 0.57–1.00)
GFR, EST AFRICAN AMERICAN: 55 mL/min/{1.73_m2} — AB (ref >59)
GFR, EST NON AFRICAN AMERICAN: 48 mL/min/{1.73_m2} — AB (ref >59)
GLOBULIN, TOTAL: 2.7 g/dL (ref 1.5–4.5)
GLUCOSE: 99 mg/dL (ref 65–99)
POTASSIUM: 4.5 mmol/L (ref 3.5–5.2)
Sodium: 139 mmol/L (ref 134–144)
TOTAL PROTEIN: 6.7 g/dL (ref 6.0–8.5)

## 2018-03-08 LAB — CBC
HEMATOCRIT: 41.9 % (ref 34.0–46.6)
HEMOGLOBIN: 13.9 g/dL (ref 11.1–15.9)
MCH: 30.2 pg (ref 26.6–33.0)
MCHC: 33.2 g/dL (ref 31.5–35.7)
MCV: 91 fL (ref 79–97)
Platelets: 355 10*3/uL (ref 150–450)
RBC: 4.6 x10E6/uL (ref 3.77–5.28)
RDW: 13.7 % (ref 12.3–15.4)
WBC: 8.7 10*3/uL (ref 3.4–10.8)

## 2018-03-08 LAB — HEMOGLOBIN A1C
Est. average glucose Bld gHb Est-mCnc: 128 mg/dL
HEMOGLOBIN A1C: 6.1 % — AB (ref 4.8–5.6)

## 2018-03-08 LAB — TSH: TSH: 1.87 u[IU]/mL (ref 0.450–4.500)

## 2018-04-07 ENCOUNTER — Other Ambulatory Visit: Payer: Self-pay | Admitting: Family Medicine

## 2018-04-07 DIAGNOSIS — I10 Essential (primary) hypertension: Secondary | ICD-10-CM

## 2018-04-11 ENCOUNTER — Other Ambulatory Visit: Payer: Self-pay | Admitting: Family Medicine

## 2018-05-02 DIAGNOSIS — L309 Dermatitis, unspecified: Secondary | ICD-10-CM | POA: Diagnosis not present

## 2018-05-02 DIAGNOSIS — L219 Seborrheic dermatitis, unspecified: Secondary | ICD-10-CM | POA: Diagnosis not present

## 2018-05-02 DIAGNOSIS — L821 Other seborrheic keratosis: Secondary | ICD-10-CM | POA: Diagnosis not present

## 2018-05-02 DIAGNOSIS — Z85828 Personal history of other malignant neoplasm of skin: Secondary | ICD-10-CM | POA: Diagnosis not present

## 2018-05-04 ENCOUNTER — Other Ambulatory Visit: Payer: Self-pay | Admitting: Family Medicine

## 2018-05-11 ENCOUNTER — Telehealth: Payer: Self-pay | Admitting: Family Medicine

## 2018-05-11 DIAGNOSIS — M545 Low back pain, unspecified: Secondary | ICD-10-CM

## 2018-05-11 MED ORDER — CYCLOBENZAPRINE HCL 5 MG PO TABS: 5.0000 mg | ORAL_TABLET | Freq: Three times a day (TID) | ORAL | 0 refills | Status: AC | PRN

## 2018-05-11 NOTE — Telephone Encounter (Signed)
Pt contacted office for refill request on the following medications:  cyclobenzaprine (FLEXERIL) 5 MG tablet   [facility]  Please advise. Thanks TNP

## 2018-05-23 DIAGNOSIS — H2513 Age-related nuclear cataract, bilateral: Secondary | ICD-10-CM | POA: Diagnosis not present

## 2018-06-13 ENCOUNTER — Other Ambulatory Visit: Payer: Self-pay | Admitting: Family Medicine

## 2018-06-13 DIAGNOSIS — I10 Essential (primary) hypertension: Secondary | ICD-10-CM

## 2018-07-02 ENCOUNTER — Ambulatory Visit (INDEPENDENT_AMBULATORY_CARE_PROVIDER_SITE_OTHER): Payer: PPO

## 2018-07-02 DIAGNOSIS — Z23 Encounter for immunization: Secondary | ICD-10-CM | POA: Diagnosis not present

## 2018-07-11 ENCOUNTER — Ambulatory Visit (INDEPENDENT_AMBULATORY_CARE_PROVIDER_SITE_OTHER): Payer: PPO | Admitting: Family Medicine

## 2018-07-11 ENCOUNTER — Encounter: Payer: Self-pay | Admitting: Family Medicine

## 2018-07-11 VITALS — BP 122/74 | HR 74 | Temp 97.6°F | Resp 16 | Wt 239.0 lb

## 2018-07-11 DIAGNOSIS — I10 Essential (primary) hypertension: Secondary | ICD-10-CM | POA: Diagnosis not present

## 2018-07-11 DIAGNOSIS — M109 Gout, unspecified: Secondary | ICD-10-CM

## 2018-07-11 DIAGNOSIS — E785 Hyperlipidemia, unspecified: Secondary | ICD-10-CM | POA: Diagnosis not present

## 2018-07-11 DIAGNOSIS — M21611 Bunion of right foot: Secondary | ICD-10-CM | POA: Diagnosis not present

## 2018-07-11 NOTE — Progress Notes (Signed)
Patient: Kayla Chan Female    DOB: 02/15/1932   84 y.o.   MRN: [id number] Visit Date: 07/11/2018  Today's Provider: Lelon Huh, MD   Chief Complaint  Patient presents with  . Hypertension  . Hyperlipidemia  . Hypothyroidism  . Hyperglycemia   Subjective:    HPI  Hypertension, follow-up:  BP Readings from Last 3 Encounters:  07/11/18 122/74  03/07/18 118/70  12/13/17 124/70    She was last seen for hypertension 4 months ago.  BP at that visit was 118/70. Management since that visit includes no changes. She reports good compliance with treatment. She is not having side effects.  She is not exercising. She is adherent to low salt diet.   Outside blood pressures are 100-120/57-63. She is experiencing lower extremity edema.  Patient denies chest pain, chest pressure/discomfort, claudication, dyspnea, exertional chest pressure/discomfort, fatigue, irregular heart beat, near-syncope, orthopnea, palpitations, paroxysmal nocturnal dyspnea, syncope and tachypnea.   Cardiovascular risk factors include advanced age (older than 52 for men, 38 for women), hypertension and obesity (BMI >= 30 kg/m2).  Use of agents associated with hypertension: NSAIDS.     Weight trend: fluctuating a bit Wt Readings from Last 3 Encounters:  07/11/18 239 lb (108.4 kg)  03/07/18 241 lb (109.3 kg)  12/13/17 240 lb (108.9 kg)    Current diet: in general, an "unhealthy" diet  ------------------------------------------------------------------------  Lipid/Cholesterol, Follow-up:   Last seen for this4 months ago.  Management changes since that visit include none. . Last Lipid Panel:    Component Value Date/Time   CHOL 254 (H) 03/07/2018 1521   TRIG 329 (H) 03/07/2018 1521   HDL 45 03/07/2018 1521   CHOLHDL 5.6 (H) 03/07/2018 1521   LDLCALC 143 (H) 03/07/2018 1521    Risk factors for vascular disease include hypercholesterolemia and hypertension  She reports good compliance  with treatment. She is not having side effects.  Current symptoms include none and have been stable. Weight trend: fluctuating a bit Prior visit with dietician: no Current diet: in general, an "unhealthy" diet Current exercise: none  Wt Readings from Last 3 Encounters:  07/11/18 239 lb (108.4 kg)  03/07/18 241 lb (109.3 kg)  12/13/17 240 lb (108.9 kg)    -------------------------------------------------------------------  Prediabetes, Follow-up:   Lab Results  Component Value Date   HGBA1C 6.1 (H) 03/07/2018   HGBA1C 6.2 (H) 09/03/2017   HGBA1C 5.9 (H) 01/13/2017   GLUCOSE 99 03/07/2018   GLUCOSE 104 (H) 09/03/2017   GLUCOSE 96 01/13/2017    Last seen for for this 4 months ago.  Management since that visit includes no changes. Current symptoms include none and have been stable.  Weight trend: fluctuating a bit Prior visit with dietician: no Current diet: in general, an "unhealthy" diet Current exercise: none  Pertinent Labs:    Component Value Date/Time   CHOL 254 (H) 03/07/2018 1521   TRIG 329 (H) 03/07/2018 1521   CHOLHDL 5.6 (H) 03/07/2018 1521   CREATININE 1.08 (H) 03/07/2018 1521   CREATININE 1.10 08/05/2013 1238    Wt Readings from Last 3 Encounters:  07/11/18 239 lb (108.4 kg)  03/07/18 241 lb (109.3 kg)  12/13/17 240 lb (108.9 kg)   Hypothyroidism: Patient was last seen for this problem 4 months ago and no changes were made. Patient reports good compliance with treatment.  Lab Results  Component Value Date   TSH 1.870 03/07/2018    She also reports she had swelling, redness and pain in  her right ankle radiating to back of her heel for the last week. She started colchicine and ibuprofen and reports pain has mostly resolved. She is concerned she may have gout and requests to have her uric acid level checked.   She has had had nodule on her right great toe that has been increasing bothersome for several months. She would like to see a podiatrist to  have it evaluated.     Allergies  Allergen Reactions  . Amlodipine     dizziness  . Atorvastatin     Other reaction(s): Muscle Pain  . Colestid  [Colestipol Hcl]     Itching, insomnia  . Crestor  [Rosuvastatin Calcium]     Other reaction(s): Muscle Pain  . Doxycycline   . Fluvastatin Sodium     Other reaction(s): Muscle Pain  . Furosemide     Severe Cramping  . Penicillins   . Pravastatin Sodium     Numbness in legs  . Verapamil     Shortness of breath, swelling, palpations.      Current Outpatient Medications:  .  aspirin 81 MG tablet, Take 81 mg by mouth daily., Disp: , Rfl:  .  chlorthalidone (HYGROTON) 25 MG tablet, Take 0.5 tablets (12.5 mg total) by mouth daily., Disp: 30 tablet, Rfl: 12 .  colchicine 0.6 MG tablet, TAKE 1 TABLET (0.6 MG TOTAL) BY MOUTH 2 (TWO) TIMES DAILY AS NEEDED (GOUT)., Disp: 30 tablet, Rfl: 5 .  ezetimibe (ZETIA) 10 MG tablet, TAKE 1 TABLET BY MOUTH IN THE MORNING, Disp: 30 tablet, Rfl: 11 .  levothyroxine (SYNTHROID, LEVOTHROID) 75 MCG tablet, TAKE 1 TABLET BY MOUTH EVERY DAY, Disp: 90 tablet, Rfl: 4 .  lisinopril (PRINIVIL,ZESTRIL) 5 MG tablet, TAKE 1 TABLET BY MOUTH EVERY DAY, Disp: 90 tablet, Rfl: 2 .  Multiple Vitamins-Minerals (MULTIVITAMIN ADULT PO), Take 1 tablet by mouth daily., Disp: , Rfl:  .  amLODipine (NORVASC) 2.5 MG tablet, Take one tablet every evening (Patient not taking: Reported on 07/11/2018), Disp: 90 tablet, Rfl: 3 .  doxazosin (CARDURA) 1 MG tablet, TAKE 1 TABLET BY MOUTH EVERY DAY (Patient not taking: Reported on 07/11/2018), Disp: 90 tablet, Rfl: 1 .  mometasone (ELOCON) 0.1 % ointment, Apply 1 application daily as needed topically. Apply to affected area (Patient not taking: Reported on 07/11/2018), Disp: 15 g, Rfl: 5  Review of Systems  Constitutional: Negative for appetite change, chills, fatigue and fever.  Respiratory: Negative for chest tightness and shortness of breath.   Cardiovascular: Negative for chest pain and  palpitations.  Gastrointestinal: Negative for abdominal pain, nausea and vomiting.  Musculoskeletal: Positive for arthralgias (right foot pain for the past 3 days).  Neurological: Negative for dizziness and weakness.    Social History   Tobacco Use  . Smoking status: Never Smoker  . Smokeless tobacco: Never Used  Substance Use Topics  . Alcohol use: Yes    Alcohol/week: 0.0 standard drinks    Comment: 1 glass of wine occasionally   Objective:   BP 122/74 (BP Location: Left Wrist, Patient Position: Sitting, Cuff Size: Large)   Pulse 74   Temp 97.6 F (36.4 C) (Oral)   Resp 16   Wt 239 lb (108.4 kg)   SpO2 96% Comment: room air  BMI 48.27 kg/m  Vitals:   07/11/18 1349  BP: 122/74  Pulse: 74  Resp: 16  Temp: 97.6 F (36.4 C)  TempSrc: Oral  SpO2: 96%  Weight: 239 lb (108.4 kg)     Physical  Exam   General Appearance:    Alert, cooperative, no distress  Eyes:    PERRL, conjunctiva/corneas clear, EOM's intact       Lungs:     Clear to auscultation bilaterally, respirations unlabored  Heart:    Regular rate and rhythm  Neurologic:   Awake, alert, oriented x 3. No apparent focal neurological           defect.          Assessment & Plan:     1. Bunion of great toe of right foot  - Ambulatory referral to Podiatry  2. Acute gout of right ankle, unspecified cause  - Uric acid  3. Essential hypertension Well controlled.  Continue current medications.   - Renal function panel  4. Hyperlipidemia, unspecified hyperlipidemia type Tolerating ezetimibe well without adverse effects.  - Lipid panel       Lelon Huh, MD  [facility]

## 2018-07-12 ENCOUNTER — Telehealth: Payer: Self-pay

## 2018-07-12 LAB — LIPID PANEL
CHOLESTEROL TOTAL: 235 mg/dL — AB (ref 100–199)
Chol/HDL Ratio: 5.2 ratio — ABNORMAL HIGH (ref 0.0–4.4)
HDL: 45 mg/dL (ref >39)
LDL CALC: 134 mg/dL — AB (ref 0–99)
Triglycerides: 281 mg/dL — ABNORMAL HIGH (ref 0–149)
VLDL CHOLESTEROL CAL: 56 mg/dL — AB (ref 5–40)

## 2018-07-12 LAB — RENAL FUNCTION PANEL
Albumin: 3.9 g/dL (ref 3.5–4.7)
BUN / CREAT RATIO: 25 (ref 12–28)
BUN: 33 mg/dL — AB (ref 8–27)
CO2: 21 mmol/L (ref 20–29)
Calcium: 9.7 mg/dL (ref 8.7–10.3)
Chloride: 102 mmol/L (ref 96–106)
Creatinine, Ser: 1.31 mg/dL — ABNORMAL HIGH (ref 0.57–1.00)
GFR, EST AFRICAN AMERICAN: 43 mL/min/{1.73_m2} — AB (ref >59)
GFR, EST NON AFRICAN AMERICAN: 37 mL/min/{1.73_m2} — AB (ref >59)
GLUCOSE: 105 mg/dL — AB (ref 65–99)
Phosphorus: 4.2 mg/dL (ref 2.5–4.5)
Potassium: 4.4 mmol/L (ref 3.5–5.2)
Sodium: 138 mmol/L (ref 134–144)

## 2018-07-12 LAB — URIC ACID: Uric Acid: 9.7 mg/dL — ABNORMAL HIGH (ref 2.5–7.1)

## 2018-07-12 MED ORDER — ALLOPURINOL 100 MG PO TABS: 100.0000 mg | ORAL_TABLET | Freq: Every day | ORAL | 12 refills | Status: DC

## 2018-07-12 NOTE — Telephone Encounter (Signed)
Pt advised.  Allopurinol sent to [facility] [address] states she has plenty of colchicine on hand.     Thanks,   -Mickel Baas

## 2018-07-12 NOTE — Telephone Encounter (Signed)
-----   Message from Birdie Sons, MD sent at 07/12/2018  7:36 AM EST ----- Uric acid levels are elevated consistent with gout. Start allopurinol 100mg  daily, #30, rf x 12. Also need to take colchicine once every day for the next months. Is on medication list. If she is out can send refills.  Cholesterol is better at 235. Continue Zetia.

## 2018-07-25 ENCOUNTER — Other Ambulatory Visit: Payer: Self-pay | Admitting: Family Medicine

## 2018-07-25 NOTE — Telephone Encounter (Signed)
Pt requesting hydrocodone cough syrup sent into [facility] [place] she has been coughing for a week and needing medication to get better.

## 2018-07-25 NOTE — Telephone Encounter (Signed)
Needs to be seen

## 2018-07-26 ENCOUNTER — Ambulatory Visit (INDEPENDENT_AMBULATORY_CARE_PROVIDER_SITE_OTHER): Payer: PPO | Admitting: Family Medicine

## 2018-07-26 ENCOUNTER — Encounter: Payer: Self-pay | Admitting: Family Medicine

## 2018-07-26 VITALS — BP 120/74 | HR 84 | Temp 97.8°F | Resp 16 | Wt 238.0 lb

## 2018-07-26 DIAGNOSIS — J069 Acute upper respiratory infection, unspecified: Secondary | ICD-10-CM

## 2018-07-26 DIAGNOSIS — E790 Hyperuricemia without signs of inflammatory arthritis and tophaceous disease: Secondary | ICD-10-CM | POA: Diagnosis not present

## 2018-07-26 DIAGNOSIS — B9789 Other viral agents as the cause of diseases classified elsewhere: Secondary | ICD-10-CM

## 2018-07-26 MED ORDER — ALLOPURINOL 100 MG PO TABS: 50.0000 mg | ORAL_TABLET | Freq: Every day | ORAL | 12 refills | Status: DC

## 2018-07-26 MED ORDER — HYDROCODONE-HOMATROPINE 5-1.5 MG/5ML PO SYRP: 5.0000 mL | ORAL_SOLUTION | Freq: Four times a day (QID) | ORAL | 0 refills | Status: DC | PRN

## 2018-07-26 NOTE — Progress Notes (Signed)
Patient: Kayla Chan Female    DOB: 22-Dec-1931   84 y.o.   MRN: [id number] Visit Date: 07/26/2018  Today's Provider: Vernie Murders, PA   Chief Complaint  Patient presents with  . Cough   Subjective:    Cough  This is a new problem. The current episode started in the past 7 days (2 day). The problem has been gradually worsening. The cough is non-productive. Associated symptoms include chills, nasal congestion, rhinorrhea, a sore throat and sweats. Pertinent negatives include no chest pain, ear congestion, ear pain, fever, headaches, heartburn, hemoptysis, myalgias, postnasal drip, rash, shortness of breath, weight loss or wheezing.   Patient has had a cough for 2 days. Patient states she feels like she is running a temperature. Patient states cough is non-productive. Other symptoms include: chills, sweats, dizziness, and runny nose. Patient states she has taken an old rx of hydrocodone cough syrup, but is out.    Past Medical History:  Diagnosis Date  . Cancer ([place])    BCC on nose  . Hypertension    Past Surgical History:  Procedure Laterality Date  . 24 hour Holter Monitor  01/2013   4550 ectopic beats with 344 superventricular bigemminy events  . Abdominal Ultrasound  05/31/2013   RUQ. Fatty Liver  . APPENDECTOMY  1950   [place]  . 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, [place]; Right   Family History  Problem Relation Age of Onset  . Stroke Mother        possible stroke  . Heart Problems Father   . Kidney cancer Sister   . Bladder Cancer Brother    Allergies  Allergen Reactions  . Amlodipine     dizziness  . Atorvastatin     Other reaction(s): Muscle Pain  . Colestid  [Colestipol Hcl]     Itching, insomnia  . Crestor  [Rosuvastatin Calcium]     Other reaction(s): Muscle Pain  . Doxycycline   . Fluvastatin Sodium     Other reaction(s): Muscle Pain  . Furosemide     Severe Cramping  . Penicillins   . Pravastatin  Sodium     Numbness in legs  . Verapamil     Shortness of breath, swelling, palpations.     Current Outpatient Medications:  .  aspirin 81 MG tablet, Take 81 mg by mouth daily., Disp: , Rfl:  .  chlorthalidone (HYGROTON) 25 MG tablet, Take 0.5 tablets (12.5 mg total) by mouth daily., Disp: 30 tablet, Rfl: 12 .  colchicine 0.6 MG tablet, TAKE 1 TABLET (0.6 MG TOTAL) BY MOUTH 2 (TWO) TIMES DAILY AS NEEDED (GOUT)., Disp: 30 tablet, Rfl: 5 .  ezetimibe (ZETIA) 10 MG tablet, TAKE 1 TABLET BY MOUTH IN THE MORNING, Disp: 30 tablet, Rfl: 11 .  levothyroxine (SYNTHROID, LEVOTHROID) 75 MCG tablet, TAKE 1 TABLET BY MOUTH EVERY DAY, Disp: 90 tablet, Rfl: 4 .  lisinopril (PRINIVIL,ZESTRIL) 5 MG tablet, TAKE 1 TABLET BY MOUTH EVERY DAY, Disp: 90 tablet, Rfl: 2 .  mometasone (ELOCON) 0.1 % ointment, Apply 1 application daily as needed topically. Apply to affected area, Disp: 15 g, Rfl: 5 .  Multiple Vitamins-Minerals (MULTIVITAMIN ADULT PO), Take 1 tablet by mouth daily., Disp: , Rfl:  .  allopurinol (ZYLOPRIM) 100 MG tablet, Take 1 tablet (100 mg total) by mouth daily. (Patient not taking: Reported on 07/26/2018), Disp: 30 tablet, Rfl: 12  Review of Systems  Constitutional: Positive for chills. Negative for fever and  weight loss.  HENT: Positive for congestion, rhinorrhea and sore throat. Negative for ear pain, postnasal drip, sinus pressure and sinus pain.   Respiratory: Positive for cough. Negative for hemoptysis, chest tightness, shortness of breath and wheezing.   Cardiovascular: Negative for chest pain.  Gastrointestinal: Negative for heartburn.  Musculoskeletal: Negative for myalgias.  Skin: Negative for rash.  Neurological: Negative for headaches.   Social History   Tobacco Use  . Smoking status: Never Smoker  . Smokeless tobacco: Never Used  Substance Use Topics  . Alcohol use: Yes    Alcohol/week: 0.0 standard drinks    Comment: 1 glass of wine occasionally   Objective:   BP 120/74  (BP Location: Left Wrist, Cuff Size: Large)   Pulse 84   Temp 97.8 F (36.6 C) (Oral)   Resp 16   Wt 238 lb (108 kg)   SpO2 97%   BMI 48.07 kg/m  Vitals:   07/26/18 1505 07/26/18 1519  BP: (!) 160/90 120/74  Pulse: 84   Resp: 16   Temp: 97.8 F (36.6 C)   TempSrc: Oral   SpO2: 97%   Weight: 238 lb (108 kg)    Physical Exam  Constitutional: She is oriented to person, place, and time. She appears well-developed and well-nourished. No distress.  HENT:  Head: Normocephalic and atraumatic.  Right Ear: Hearing and external ear normal.  Left Ear: Hearing and external ear normal.  Nose: Nose normal.  Mouth/Throat: Oropharynx is clear and moist.  Hearing loss with hearing aids bilaterally. Poor balance with history of knee replacements.  Eyes: Conjunctivae and lids are normal. Right eye exhibits no discharge. Left eye exhibits no discharge. No scleral icterus.  Neck: Neck supple.  Cardiovascular: Normal rate and regular rhythm.  Pulmonary/Chest: Effort normal and breath sounds normal. No respiratory distress.  Abdominal: Soft. Bowel sounds are normal.  Musculoskeletal: Normal range of motion.  Neurological: She is alert and oriented to person, place, and time.  Skin: Skin is intact. No lesion and no rash noted.  Psychiatric: She has a normal mood and affect. Her speech is normal and behavior is normal. Thought content normal.      Assessment & Plan:      1. Viral URI with cough Onset 2 days ago with some rhinorrhea and sore throat initially. Hycodan Syrup helped control cough at night. Will refill this prescription and may use OTC antihistamine prn rhinorrhea with PND. Increase fluid intake and recheck if any signs of purulent sputum/drainage with fever. - HYDROcodone-homatropine (HYCODAN) 5-1.5 MG/5ML syrup; Take 5 mLs by mouth every 6 (six) hours as needed for cough.  Dispense: 120 mL; Refill: 0  2. Hyperuricemia Uric acid level 9.7 on 07-11-18 and has taken 4 Allopurinol 100  mg tablets since that time. States this causes some dizziness but no nausea or vomiting. Balance very poor with history of right knee replacement. Continue use of cane for support. Recommend using 1/2 tablet of Allopurinol and given purine restricted diet suggestions. Should recheck uric acid level in 2-3 months.       Vernie Murders, PA  [facility]

## 2018-07-26 NOTE — Telephone Encounter (Signed)
Apt made for 3pm with [employer],   -Mickel Baas

## 2018-08-12 ENCOUNTER — Encounter: Payer: Self-pay | Admitting: Family Medicine

## 2018-08-12 ENCOUNTER — Ambulatory Visit (INDEPENDENT_AMBULATORY_CARE_PROVIDER_SITE_OTHER): Payer: PPO | Admitting: Family Medicine

## 2018-08-12 VITALS — BP 110/70 | HR 76 | Temp 97.5°F | Resp 16

## 2018-08-12 DIAGNOSIS — M545 Low back pain, unspecified: Secondary | ICD-10-CM

## 2018-08-12 DIAGNOSIS — R8271 Bacteriuria: Secondary | ICD-10-CM

## 2018-08-12 LAB — POCT URINALYSIS DIPSTICK
APPEARANCE: NORMAL
BILIRUBIN UA: NEGATIVE
Glucose, UA: NEGATIVE
Ketones, UA: NEGATIVE
Nitrite, UA: NEGATIVE
Odor: NORMAL
PH UA: 6 (ref 5.0–8.0)
Protein, UA: NEGATIVE
Spec Grav, UA: 1.015 (ref 1.010–1.025)
Urobilinogen, UA: 0.2 E.U./dL

## 2018-08-12 NOTE — Progress Notes (Signed)
Patient: Kayla Chan Female    DOB: 1931-10-09   84 y.o.   MRN: [id number] Visit Date: 08/12/2018  Today's Provider: Vernie Murders, PA   Chief Complaint  Patient presents with  . Back Pain   Subjective:     HPI   Patient has had low back pain (right side) for several months. Patient states her back pain has worsened over the past week. Patient states she is having spasms. Patient was given cyclobenzaprine by Dr. Caryn Section but patient has never taken it because the pharmacist told her she could not take it with her other medications. Patient has been using heating pad and has taken 1 tylenol with no relief.     Allergies  Allergen Reactions  . Amlodipine     dizziness  . Atorvastatin     Other reaction(s): Muscle Pain  . Colestid  [Colestipol Hcl]     Itching, insomnia  . Crestor  [Rosuvastatin Calcium]     Other reaction(s): Muscle Pain  . Doxycycline   . Fluvastatin Sodium     Other reaction(s): Muscle Pain  . Furosemide     Severe Cramping  . Penicillins   . Pravastatin Sodium     Numbness in legs  . Verapamil     Shortness of breath, swelling, palpations.      Current Outpatient Medications:  .  allopurinol (ZYLOPRIM) 100 MG tablet, Take 0.5 tablets (50 mg total) by mouth daily., Disp: 30 tablet, Rfl: 12 .  aspirin 81 MG tablet, Take 81 mg by mouth daily., Disp: , Rfl:  .  chlorthalidone (HYGROTON) 25 MG tablet, Take 0.5 tablets (12.5 mg total) by mouth daily., Disp: 30 tablet, Rfl: 12 .  colchicine 0.6 MG tablet, TAKE 1 TABLET (0.6 MG TOTAL) BY MOUTH 2 (TWO) TIMES DAILY AS NEEDED (GOUT)., Disp: 30 tablet, Rfl: 5 .  ezetimibe (ZETIA) 10 MG tablet, TAKE 1 TABLET BY MOUTH IN THE MORNING, Disp: 30 tablet, Rfl: 11 .  HYDROcodone-homatropine (HYCODAN) 5-1.5 MG/5ML syrup, Take 5 mLs by mouth every 6 (six) hours as needed for cough., Disp: 120 mL, Rfl: 0 .  levothyroxine (SYNTHROID, LEVOTHROID) 75 MCG tablet, TAKE 1 TABLET BY MOUTH EVERY DAY, Disp: 90 tablet,  Rfl: 4 .  lisinopril (PRINIVIL,ZESTRIL) 5 MG tablet, TAKE 1 TABLET BY MOUTH EVERY DAY, Disp: 90 tablet, Rfl: 2 .  mometasone (ELOCON) 0.1 % ointment, Apply 1 application daily as needed topically. Apply to affected area, Disp: 15 g, Rfl: 5 .  Multiple Vitamins-Minerals (MULTIVITAMIN ADULT PO), Take 1 tablet by mouth daily., Disp: , Rfl:   Review of Systems  Constitutional: Negative for appetite change, chills, fatigue and fever.  Respiratory: Negative for chest tightness and shortness of breath.   Cardiovascular: Negative for chest pain and palpitations.  Gastrointestinal: Negative for abdominal pain, nausea and vomiting.  Musculoskeletal: Positive for back pain.  Neurological: Negative for dizziness and weakness.   Social History   Tobacco Use  . Smoking status: Never Smoker  . Smokeless tobacco: Never Used  Substance Use Topics  . Alcohol use: Yes    Alcohol/week: 0.0 standard drinks    Comment: 1 glass of wine occasionally     Objective:   BP 110/70 (BP Location: Right Wrist, Patient Position: Sitting, Cuff Size: Normal)   Pulse 76   Temp (!) 97.5 F (36.4 C) (Oral)   Resp 16   SpO2 98%  Vitals:   08/12/18 1333  BP: 110/70  Pulse: 76  Resp: 16  Temp: (!) 97.5 F (36.4 C)  TempSrc: Oral  SpO2: 98%   Physical Exam Constitutional:      General: She is not in acute distress.    Appearance: She is well-developed.  HENT:     Head: Normocephalic and atraumatic.     Right Ear: Hearing normal.     Left Ear: Hearing normal.     Nose: Nose normal.  Eyes:     General: Lids are normal. No scleral icterus.       Right eye: No discharge.        Left eye: No discharge.     Conjunctiva/sclera: Conjunctivae normal.  Neck:     Musculoskeletal: Normal range of motion and neck supple.  Cardiovascular:     Rate and Rhythm: Normal rate and regular rhythm.  Pulmonary:     Effort: Pulmonary effort is normal. No respiratory distress.  Abdominal:     General: Bowel sounds are  normal.     Palpations: Abdomen is soft.     Tenderness: There is no abdominal tenderness.  Musculoskeletal:     Comments: Acute spasms in lower back with certain movements. SLR's to 90 degrees without pain or weakness. No radiation of pain into legs. ROM limited due to spasms.  Skin:    Findings: No lesion or rash.  Neurological:     Mental Status: She is alert and oriented to person, place, and time.  Psychiatric:        Speech: Speech normal.        Behavior: Behavior normal.        Thought Content: Thought content normal.       Assessment & Plan    1. Acute right-sided low back pain without sciatica Sharp pains in the right lower back without radiation or numbness over the past few days. No known injury. Jolting pains with twisting movements. No significant relief from use of moist heat. May try ice packs and take Flexeril 5 mg with Ibuprofen 200 mg 3 tablets at bedtime. Recheck prn. - POCT urinalysis dipstick - CULTURE, URINE COMPREHENSIVE  2. Bacteriuria Urinalysis showed many bacterial rods with 15-25 WBC's today. Some frequency but no dysuria or fever. Will get urine C&S and encouraged to drink extra fluids. Recheck pending reports. - CULTURE, URINE COMPREHENSIVE     Vernie Murders, PA  [facility]

## 2018-08-15 ENCOUNTER — Telehealth: Payer: Self-pay | Admitting: *Deleted

## 2018-08-15 MED ORDER — NITROFURANTOIN MONOHYD MACRO 100 MG PO CAPS: 100.0000 mg | ORAL_CAPSULE | Freq: Two times a day (BID) | ORAL | 0 refills | Status: DC

## 2018-08-15 NOTE — Telephone Encounter (Signed)
Patient was notified of results. Expressed understanding. Rx sent to pharmacy. 

## 2018-08-15 NOTE — Telephone Encounter (Signed)
-----   Message from Margo Common, [place] sent at 08/15/2018  8:00 AM EST ----- Preliminary report isolating E.coli bacteria in urine. Recommend Macrobid 100 mg BID #20. Recheck in 10 days if any symptoms remain. Drink extra fluids to flush out urinary tract.

## 2018-08-17 LAB — CULTURE, URINE COMPREHENSIVE

## 2018-08-23 ENCOUNTER — Ambulatory Visit: Payer: Self-pay | Admitting: Podiatry

## 2018-08-30 ENCOUNTER — Ambulatory Visit: Payer: PPO | Admitting: Podiatry

## 2018-08-30 ENCOUNTER — Encounter: Payer: Self-pay | Admitting: Podiatry

## 2018-08-30 ENCOUNTER — Ambulatory Visit (INDEPENDENT_AMBULATORY_CARE_PROVIDER_SITE_OTHER): Payer: PPO

## 2018-08-30 VITALS — BP 185/92 | HR 82

## 2018-08-30 DIAGNOSIS — M21611 Bunion of right foot: Secondary | ICD-10-CM | POA: Diagnosis not present

## 2018-08-30 DIAGNOSIS — M659 Synovitis and tenosynovitis, unspecified: Secondary | ICD-10-CM

## 2018-09-01 NOTE — Progress Notes (Signed)
   Subjective:  85 year old female presenting today as a new patient with a chief complaint of intermittent pain of the medial aspect of the right great toe that has been ongoing for years. She reports associated pain that radiates into the lateral right ankle. Walking and bearing weight increases the pain. She has not done anything for treatment. Patient is here for further evaluation and treatment.   Past Medical History:  Diagnosis Date  . Cancer ([place])    BCC on nose  . Hypertension      Objective / Physical Exam:  General:  The patient is alert and oriented x3 in no acute distress. Dermatology:  Skin is warm, dry and supple bilateral lower extremities. Negative for open lesions or macerations. Vascular:  Palpable pedal pulses bilaterally. No edema or erythema noted. Capillary refill within normal limits. Neurological:  Epicritic and protective threshold grossly intact bilaterally.  Musculoskeletal Exam:  Pain on palpation to the anterior lateral medial aspects of the patient's left ankle. Mild edema noted. Range of motion within normal limits to all pedal and ankle joints bilateral. Muscle strength 5/5 in all groups bilateral.   Radiographic Exam:  Normal osseous mineralization. Joint spaces preserved. No fracture/dislocation/boney destruction.    Assessment: 1. right ankle synovitis   Plan of Care:  1. Patient was evaluated. X-Rays reviewed.  2. injection of 0.5 mL Celestone Soluspan injected in the patient's left ankle. 3. Declined oral NSAIDs.  4. Return to clinic in 6 weeks.   From [place].    Edrick Kins, DPM [facility]  Dr. Edrick Kins, [address]                                        [place], [place] [id number]                Office [phone]  Fax [phone]

## 2018-09-09 ENCOUNTER — Other Ambulatory Visit: Payer: Self-pay | Admitting: Family Medicine

## 2018-09-09 DIAGNOSIS — I10 Essential (primary) hypertension: Secondary | ICD-10-CM

## 2018-09-19 ENCOUNTER — Ambulatory Visit: Payer: PPO

## 2018-09-21 ENCOUNTER — Ambulatory Visit (INDEPENDENT_AMBULATORY_CARE_PROVIDER_SITE_OTHER): Payer: PPO

## 2018-09-21 VITALS — BP 136/66 | HR 82 | Temp 98.2°F | Ht 59.0 in | Wt 237.8 lb

## 2018-09-21 DIAGNOSIS — Z0000 Encounter for general adult medical examination without abnormal findings: Secondary | ICD-10-CM | POA: Diagnosis not present

## 2018-09-21 DIAGNOSIS — Z13820 Encounter for screening for osteoporosis: Secondary | ICD-10-CM

## 2018-09-21 NOTE — Progress Notes (Signed)
Subjective:   Kayla Chan is a 85 y.o. female who presents for Medicare Annual (Subsequent) preventive examination.  Review of Systems:  N/A  Cardiac Risk Factors include: advanced age (>45men, >54 women);dyslipidemia;hypertension;obesity (BMI >30kg/m2)     Objective:     Vitals: BP 136/66 (BP Location: Left Arm)   Pulse 82   Temp 98.2 F (36.8 C) (Oral)   Ht 4\' 11"  (1.499 m)   Wt 237 lb 12.8 oz (107.9 kg)   BMI 48.03 kg/m   Body mass index is 48.03 kg/m.  Advanced Directives 09/21/2018 10/19/2017 09/03/2017 09/03/2017 08/19/2016  Does Patient Have a Medical Advance Directive? Yes Yes No No No  Type of Paramedic of [place];Living will [place];Living will - - -  Does patient want to make changes to medical advance directive? - No - Patient declined - - No - Patient declined  Copy of [place] in Chart? No - copy requested No - copy requested - - -  Would patient like information on creating a medical advance directive? - - Yes (MAU/Ambulatory/Procedural Areas - Information given) No - Patient declined -    Tobacco Social History   Tobacco Use  Smoking Status Never Smoker  Smokeless Tobacco Never Used     Counseling given: Not Answered   Clinical Intake:  Pre-visit preparation completed: Yes  Pain : 0-10 Pain Score: 6  Pain Type: Acute pain(Recently dx with gout. Currently on treatment.) Pain Location: Foot(and ankle) Pain Orientation: Left Pain Descriptors / Indicators: Aching Pain Frequency: Intermittent(x 6 months)     Nutritional Status: BMI > 30  Obese Nutritional Risks: Nausea/ vomitting/ diarrhea(Some diarrhea due to use of Allopurinol. ) Diabetes: No  How often do you need to have someone help you when you read instructions, pamphlets, or other written materials from your doctor or pharmacy?: 1 - Never  Interpreter Needed?: No  Information entered by :: [facility], LPN  Past  Medical History:  Diagnosis Date  . Cancer ([place])    BCC on nose  . Hypertension    Past Surgical History:  Procedure Laterality Date  . 24 hour Holter Monitor  01/2013   4550 ectopic beats with 344 superventricular bigemminy events  . Abdominal Ultrasound  05/31/2013   RUQ. Fatty Liver  . APPENDECTOMY  1950   [place]  . 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, [place]; Right   Family History  Problem Relation Age of Onset  . Stroke Mother        possible stroke  . Heart Problems Father   . Kidney cancer Sister   . Bladder Cancer Brother    Social History   Socioeconomic History  . Marital status: Married    Spouse name: Not on file  . Number of children: 0  . Years of education: Not on file  . Highest education level: Some college, no degree  Occupational History  . Occupation: Retired  Scientific laboratory technician  . Financial resource strain: Not hard at all  . Food insecurity:    Worry: Never true    Inability: Never true  . Transportation needs:    Medical: No    Non-medical: No  Tobacco Use  . Smoking status: Never Smoker  . Smokeless tobacco: Never Used  Substance and Sexual Activity  . Alcohol use: Yes    Alcohol/week: 0.0 standard drinks    Comment: 1 glass of wine occasionally  . Drug use: No  . Sexual  activity: Not on file  Lifestyle  . Physical activity:    Days per week: Not on file    Minutes per session: Not on file  . Stress: Not at all  Relationships  . Social connections:    Talks on phone: Not on file    Gets together: Not on file    Attends religious service: Not on file    Active member of club or organization: Not on file    Attends meetings of clubs or organizations: Not on file    Relationship status: Not on file  Other Topics Concern  . Not on file  Social History Narrative  . Not on file    Outpatient Encounter Medications as of 09/21/2018  Medication Sig  . aspirin 81 MG tablet Take 81 mg by mouth daily.  .  chlorthalidone (HYGROTON) 25 MG tablet Take 0.5 tablets (12.5 mg total) by mouth daily.  . colchicine 0.6 MG tablet TAKE 1 TABLET (0.6 MG TOTAL) BY MOUTH 2 (TWO) TIMES DAILY AS NEEDED (GOUT).  Marland Kitchen ezetimibe (ZETIA) 10 MG tablet TAKE 1 TABLET BY MOUTH IN THE MORNING  . hydrocortisone 2.5 % lotion Apply to face once daily as needed for itchiness  . levothyroxine (SYNTHROID, LEVOTHROID) 75 MCG tablet TAKE 1 TABLET BY MOUTH EVERY DAY  . lisinopril (PRINIVIL,ZESTRIL) 5 MG tablet TAKE 1 TABLET BY MOUTH EVERY DAY  . mometasone (ELOCON) 0.1 % ointment Apply 1 application daily as needed topically. Apply to affected area  . Multiple Vitamins-Minerals (MULTIVITAMIN ADULT PO) Take 1 tablet by mouth daily.  Marland Kitchen allopurinol (ZYLOPRIM) 100 MG tablet Take 0.5 tablets (50 mg total) by mouth daily. (Patient not taking: Reported on 09/21/2018)  . Aspirin-Calcium Carbonate 81-777 MG TABS Take by mouth.  . cyclobenzaprine (FLEXERIL) 5 MG tablet TAKE 1 TABLET BY MOUTH 3 (THREE) TIMES DAILY AS NEEDED FOR UP TO 10 DAYS FOR MUSCLE SPASMS.  Marland Kitchen HYDROcodone-homatropine (HYCODAN) 5-1.5 MG/5ML syrup Take 5 mLs by mouth every 6 (six) hours as needed for cough. (Patient not taking: Reported on 09/21/2018)  . ketoconazole (NIZORAL) 2 % shampoo Apply topically.  . nitrofurantoin, macrocrystal-monohydrate, (MACROBID) 100 MG capsule Take 1 capsule (100 mg total) by mouth 2 (two) times daily. (Patient not taking: Reported on 09/21/2018)  . pimecrolimus (ELIDEL) 1 % cream Apply topically.  . verapamil (VERELAN PM) 120 MG 24 hr capsule Take by mouth.   No facility-administered encounter medications on file as of 09/21/2018.     Activities of Daily Living In your present state of health, do you have any difficulty performing the following activities: 09/21/2018  Hearing? Y  Comment Wears bilateral hearing aids.   Vision? N  Comment Wears readers PRN.  Difficulty concentrating or making decisions? N  Walking or climbing stairs? Y    Comment Due to foot, knee and hip pain.  Dressing or bathing? N  Doing errands, shopping? N  Preparing Food and eating ? N  Using the Toilet? N  In the past six months, have you accidently leaked urine? N  Do you have problems with loss of bowel control? N  Managing your Medications? N  Managing your Finances? N  Housekeeping or managing your Housekeeping? N  Some recent data might be hidden    Patient Care Team: Birdie Sons, MD as PCP - General (Family Medicine) Regino Schultze, MD as Referring Physician (Dermatology) Birder Robson, MD as Referring Physician (Ophthalmology) Edrick Kins, DPM as Consulting Physician (Podiatry) Hessie Knows, MD as Consulting Physician (  Orthopedic Surgery)    Assessment:   This is a routine wellness examination for Nariyah.  Exercise Activities and Dietary recommendations Current Exercise Habits: The patient does not participate in regular exercise at present, Exercise limited by: orthopedic condition(s);Other - see comments(Recently dx with gout.)  Goals    . Cut back on carbohydrates     Recommend cutting back on the bad carbohydrates and focusing on eating more healthy carbohydrates. Avoid eating large consumption's of bread and butter.     . Increase water intake     Starting 08/19/16, I will continue to drink 5 glasses of water a day.     . Reduce caffeine intake     Recommend to cut back on caffinated beverages and increase water consumption.        Fall Risk Fall Risk  09/21/2018 09/03/2017 09/03/2017 08/19/2016 07/13/2016  Falls in the past year? 0 No No No No   FALL RISK PREVENTION PERTAINING TO THE HOME:  Any stairs in or around the home WITH handrails? Yes  Home free of loose throw rugs in walkways, pet beds, electrical cords, etc? Yes  Adequate lighting in your home to reduce risk of falls? Yes   ASSISTIVE DEVICES UTILIZED TO PREVENT FALLS:  Life alert? No  Use of a cane, walker or w/c? Yes  Grab bars in the  bathroom? Yes  Shower chair or bench in shower? Yes  Elevated toilet seat or a handicapped toilet? Yes    TIMED UP AND GO:  Was the test performed? No .     Depression Screen PHQ 2/9 Scores 09/21/2018 09/21/2018 09/03/2017 09/03/2017  PHQ - 2 Score 0 0 0 0  PHQ- 9 Score 0 - 1 -     Cognitive Function     6CIT Screen 09/21/2018 08/19/2016  What Year? 0 points 0 points  What month? 0 points 0 points  What time? 0 points 0 points  Count back from 20 0 points 0 points  Months in reverse 4 points 0 points  Repeat phrase 6 points 4 points  Total Score 10 4    Immunization History  Administered Date(s) Administered  . Influenza Split 06/11/2007, 06/09/2008, 05/30/2009  . Influenza, High Dose Seasonal PF 05/31/2015, 05/30/2016, 06/10/2017, 07/02/2018  . Influenza,inj,Quad PF,6+ Mos 06/20/2013, 05/09/2014  . Pneumococcal Conjugate-13 05/09/2014  . Pneumococcal Polysaccharide-23 05/24/2006    Qualifies for Shingles Vaccine? Yes . Due for Shingrix. Education has been provided regarding the importance of this vaccine. Pt has been advised to call insurance company to determine out of pocket expense. Advised may also receive vaccine at local pharmacy or Health Dept. Verbalized acceptance and understanding.  Tdap: Although this vaccine is not a covered service during a Wellness Exam, does the patient still wish to receive this vaccine today?  No .  Education has been provided regarding the importance of this vaccine. Advised may receive this vaccine at local pharmacy or Health Dept. Aware to provide a copy of the vaccination record if obtained from local pharmacy or Health Dept. Verbalized acceptance and understanding.  Flu Vaccine: Up to date  Pneumococcal Vaccine: Up to date   Screening Tests Health Maintenance  Topic Date Due  . TETANUS/TDAP  05/07/1951  . DEXA SCAN  08/19/2026 (Originally 05/08/1999)  . INFLUENZA VACCINE  Completed  . PNA vac Low Risk Adult  Completed     Cancer Screenings:  Colorectal Screening: No longer required.   Mammogram: No longer required.   Bone Density: Ordered today. Pt aware  the office will call re: appt.  Lung Cancer Screening: (Low Dose CT Chest recommended if Age 68-80 years, 30 pack-year currently smoking OR have quit w/in 15years.) does not qualify.    Additional Screening:  Vision Screening: Recommended annual ophthalmology exams for early detection of glaucoma and other disorders of the eye.  Dental Screening: Recommended annual dental exams for proper oral hygiene  Community Resource Referral:  CRR required this visit?  No       Plan:  I have personally reviewed and addressed the Medicare Annual Wellness questionnaire and have noted the following in the patient's chart:  A. Medical and social history B. Use of alcohol, tobacco or illicit drugs  C. Current medications and supplements D. Functional ability and status E.  Nutritional status F.  Physical activity G. Advance directives H. List of other physicians I.  Hospitalizations, surgeries, and ER visits in previous 12 months J.  [place] such as hearing and vision if needed, cognitive and depression L. Referrals and appointments - none  In addition, I have reviewed and discussed with patient certain preventive protocols, quality metrics, and best practice recommendations. A written personalized care plan for preventive services as well as general preventive health recommendations were provided to patient.  See attached scanned questionnaire for additional information.   Signed,  Fabio Neighbors, LPN Nurse Health Advisor   Nurse Recommendations: None.

## 2018-09-21 NOTE — Patient Instructions (Signed)
Kayla Chan , Thank you for taking time to come for your Medicare Wellness Visit. I appreciate your ongoing commitment to your health goals. Please review the following plan we discussed and let me know if I can assist you in the future.   Screening recommendations/referrals: Colonoscopy: No longer required.  Mammogram: No longer required.  Bone Density: Ordered today. Pt aware office will contact her to set up apt.  Recommended yearly ophthalmology/optometry visit for glaucoma screening and checkup Recommended yearly dental visit for hygiene and checkup  Vaccinations: Influenza vaccine: Up to date Pneumococcal vaccine: Completed series Tdap vaccine: Pt declines today.  Shingles vaccine: Pt declines today.     Advanced directives: Please bring a copy of your POA (Power of Attorney) and/or Living Will to your next appointment.   Conditions/risks identified: Obesity- recommend to cut back on caffinated beverages and increase water consumption.   Next appointment: 11/08/18 @ 3:00 PM with Dr Caryn Section.    Preventive Care 83 Years and Older, Female Preventive care refers to lifestyle choices and visits with your health care provider that can promote health and wellness. What does preventive care include?  A yearly physical exam. This is also called an annual well check.  Dental exams once or twice a year.  Routine eye exams. Ask your health care provider how often you should have your eyes checked.  Personal lifestyle choices, including:  Daily care of your teeth and gums.  Regular physical activity.  Eating a healthy diet.  Avoiding tobacco and drug use.  Limiting alcohol use.  Practicing safe sex.  Taking low-dose aspirin every day.  Taking vitamin and mineral supplements as recommended by your health care provider. What happens during an annual well check? The services and screenings done by your health care provider during your annual well check will depend on your age,  overall health, lifestyle risk factors, and family history of disease. Counseling  Your health care provider may ask you questions about your:  Alcohol use.  Tobacco use.  Drug use.  Emotional well-being.  Home and relationship well-being.  Sexual activity.  Eating habits.  History of falls.  Memory and ability to understand (cognition).  Work and work Statistician.  Reproductive health. Screening  You may have the following tests or measurements:  Height, weight, and BMI.  Blood pressure.  Lipid and cholesterol levels. These may be checked every 5 years, or more frequently if you are over 41 years old.  Skin check.  Lung cancer screening. You may have this screening every year starting at age 12 if you have a 30-pack-year history of smoking and currently smoke or have quit within the past 15 years.  Fecal occult blood test (FOBT) of the stool. You may have this test every year starting at age 66.  Flexible sigmoidoscopy or colonoscopy. You may have a sigmoidoscopy every 5 years or a colonoscopy every 10 years starting at age 46.  Hepatitis C blood test.  Hepatitis B blood test.  Sexually transmitted disease (STD) testing.  Diabetes screening. This is done by checking your blood sugar (glucose) after you have not eaten for a while (fasting). You may have this done every 1-3 years.  Bone density scan. This is done to screen for osteoporosis. You may have this done starting at age 29.  Mammogram. This may be done every 1-2 years. Talk to your health care provider about how often you should have regular mammograms. Talk with your health care provider about your test results, treatment options,  and if necessary, the need for more tests. Vaccines  Your health care provider may recommend certain vaccines, such as:  Influenza vaccine. This is recommended every year.  Tetanus, diphtheria, and acellular pertussis (Tdap, Td) vaccine. You may need a Td booster every 10  years.  Zoster vaccine. You may need this after age 14.  Pneumococcal 13-valent conjugate (PCV13) vaccine. One dose is recommended after age 1.  Pneumococcal polysaccharide (PPSV23) vaccine. One dose is recommended after age 46. Talk to your health care provider about which screenings and vaccines you need and how often you need them. This information is not intended to replace advice given to you by your health care provider. Make sure you discuss any questions you have with your health care provider. Document Released: 09/06/2015 Document Revised: 04/29/2016 Document Reviewed: 06/11/2015 Elsevier Interactive Patient Education  2017 [place] Prevention in the Home Falls can cause injuries. They can happen to people of all ages. There are many things you can do to make your home safe and to help prevent falls. What can I do on the outside of my home?  Regularly fix the edges of walkways and driveways and fix any cracks.  Remove anything that might make you trip as you walk through a door, such as a raised step or threshold.  Trim any bushes or trees on the path to your home.  Use bright outdoor lighting.  Clear any walking paths of anything that might make someone trip, such as rocks or tools.  Regularly check to see if handrails are loose or broken. Make sure that both sides of any steps have handrails.  Any raised decks and porches should have guardrails on the edges.  Have any leaves, snow, or ice cleared regularly.  Use sand or salt on walking paths during winter.  Clean up any spills in your garage right away. This includes oil or grease spills. What can I do in the bathroom?  Use night lights.  Install grab bars by the toilet and in the tub and shower. Do not use towel bars as grab bars.  Use non-skid mats or decals in the tub or shower.  If you need to sit down in the shower, use a plastic, non-slip stool.  Keep the floor dry. Clean up any water that  spills on the floor as soon as it happens.  Remove soap buildup in the tub or shower regularly.  Attach bath mats securely with double-sided non-slip rug tape.  Do not have throw rugs and other things on the floor that can make you trip. What can I do in the bedroom?  Use night lights.  Make sure that you have a light by your bed that is easy to reach.  Do not use any sheets or blankets that are too big for your bed. They should not hang down onto the floor.  Have a firm chair that has side arms. You can use this for support while you get dressed.  Do not have throw rugs and other things on the floor that can make you trip. What can I do in the kitchen?  Clean up any spills right away.  Avoid walking on wet floors.  Keep items that you use a lot in easy-to-reach places.  If you need to reach something above you, use a strong step stool that has a grab bar.  Keep electrical cords out of the way.  Do not use floor polish or wax that makes floors slippery. If  you must use wax, use non-skid floor wax.  Do not have throw rugs and other things on the floor that can make you trip. What can I do with my stairs?  Do not leave any items on the stairs.  Make sure that there are handrails on both sides of the stairs and use them. Fix handrails that are broken or loose. Make sure that handrails are as long as the stairways.  Check any carpeting to make sure that it is firmly attached to the stairs. Fix any carpet that is loose or worn.  Avoid having throw rugs at the top or bottom of the stairs. If you do have throw rugs, attach them to the floor with carpet tape.  Make sure that you have a light switch at the top of the stairs and the bottom of the stairs. If you do not have them, ask someone to add them for you. What else can I do to help prevent falls?  Wear shoes that:  Do not have high heels.  Have rubber bottoms.  Are comfortable and fit you well.  Are closed at the  toe. Do not wear sandals.  If you use a stepladder:  Make sure that it is fully opened. Do not climb a closed stepladder.  Make sure that both sides of the stepladder are locked into place.  Ask someone to hold it for you, if possible.  Clearly mark and make sure that you can see:  Any grab bars or handrails.  First and last steps.  Where the edge of each step is.  Use tools that help you move around (mobility aids) if they are needed. These include:  Canes.  Walkers.  Scooters.  Crutches.  Turn on the lights when you go into a dark area. Replace any light bulbs as soon as they burn out.  Set up your furniture so you have a clear path. Avoid moving your furniture around.  If any of your floors are uneven, fix them.  If there are any pets around you, be aware of where they are.  Review your medicines with your doctor. Some medicines can make you feel dizzy. This can increase your chance of falling. Ask your doctor what other things that you can do to help prevent falls. This information is not intended to replace advice given to you by your health care provider. Make sure you discuss any questions you have with your health care provider. Document Released: 06/06/2009 Document Revised: 01/16/2016 Document Reviewed: 09/14/2014 Elsevier Interactive Patient Education  2017 [employer].

## 2018-09-30 ENCOUNTER — Other Ambulatory Visit: Payer: Self-pay | Admitting: Family Medicine

## 2018-09-30 DIAGNOSIS — L309 Dermatitis, unspecified: Secondary | ICD-10-CM

## 2018-10-03 DIAGNOSIS — L219 Seborrheic dermatitis, unspecified: Secondary | ICD-10-CM | POA: Diagnosis not present

## 2018-10-03 DIAGNOSIS — L309 Dermatitis, unspecified: Secondary | ICD-10-CM | POA: Diagnosis not present

## 2018-10-11 ENCOUNTER — Encounter: Payer: Self-pay | Admitting: Podiatry

## 2018-10-11 ENCOUNTER — Other Ambulatory Visit: Payer: Self-pay | Admitting: Podiatry

## 2018-10-11 ENCOUNTER — Ambulatory Visit (INDEPENDENT_AMBULATORY_CARE_PROVIDER_SITE_OTHER): Payer: PPO

## 2018-10-11 ENCOUNTER — Ambulatory Visit: Payer: PPO | Admitting: Podiatry

## 2018-10-11 DIAGNOSIS — S92534A Nondisplaced fracture of distal phalanx of right lesser toe(s), initial encounter for closed fracture: Secondary | ICD-10-CM

## 2018-10-11 DIAGNOSIS — M659 Synovitis and tenosynovitis, unspecified: Secondary | ICD-10-CM | POA: Diagnosis not present

## 2018-10-11 DIAGNOSIS — M79671 Pain in right foot: Secondary | ICD-10-CM

## 2018-10-11 DIAGNOSIS — S92514A Nondisplaced fracture of proximal phalanx of right lesser toe(s), initial encounter for closed fracture: Secondary | ICD-10-CM

## 2018-10-11 MED ORDER — MELOXICAM 15 MG PO TABS: 15.0000 mg | ORAL_TABLET | Freq: Every day | ORAL | 1 refills | Status: AC

## 2018-10-13 NOTE — Progress Notes (Signed)
   Subjective:  85 year old female presenting today for follow up evaluation of right ankle synovitis. She states her pain has improved after receiving the injection. She denies any worsening factors.  She also notes a new complaint of moderate pain to the right fifth toe that began 2-3 weeks ago after hitting it on the bed post. Wearing shoes increases the pain. She has not done anything for treatment. Patient is here for further evaluation and treatment.   Past Medical History:  Diagnosis Date  . Cancer ([place])    BCC on nose  . Hypertension      Objective / Physical Exam:  General:  The patient is alert and oriented x3 in no acute distress. Dermatology:  Skin is warm, dry and supple bilateral lower extremities. Negative for open lesions or macerations. Vascular:  Palpable pedal pulses bilaterally. No erythema noted. Capillary refill within normal limits. Neurological:  Epicritic and protective threshold grossly intact bilaterally.  Musculoskeletal Exam:  Pain on palpation to the anterior lateral medial aspects of the patient's left ankle. Mild edema noted. Pain with palpation and edema noted to the proximal phalanx of the right fifth toe. Range of motion within normal limits to all pedal and ankle joints bilateral. Muscle strength 5/5 in all groups bilateral.   Radiographic Exam:  Closed, nondisplaced fracture noted to the proximal phalanx of the right fifth digit.     Assessment: 1. right ankle synovitis - improved  2. Fracture right fifth toe proximal phalanx  Plan of Care:  1. Patient was evaluated. X-Rays reviewed.  2. Declined post op shoe.  3. Recommended good shoe gear.  4. Prescription for Meloxicam provided to patient. 5. Return to clinic as needed.    From [place].    Edrick Kins, DPM [facility]  Dr. Edrick Kins, [address]                                        [place], [place] [address]                Office [phone]   Fax [phone]

## 2018-10-20 ENCOUNTER — Telehealth: Payer: Self-pay | Admitting: Family Medicine

## 2018-10-20 NOTE — Telephone Encounter (Signed)
Still having some slight UTI symptoms and asking if Dr. Caryn Section could call her in something else at:  [id number] - [place], [place] [phone] (Phone) [phone] (Fax)   Thanks, [facility]

## 2018-10-20 NOTE — Telephone Encounter (Signed)
Office visit scheduled for tomorrow 10/21/2018 at 10:20am.

## 2018-10-21 ENCOUNTER — Other Ambulatory Visit: Payer: Self-pay

## 2018-10-21 ENCOUNTER — Ambulatory Visit (INDEPENDENT_AMBULATORY_CARE_PROVIDER_SITE_OTHER): Payer: PPO | Admitting: Family Medicine

## 2018-10-21 ENCOUNTER — Encounter: Payer: Self-pay | Admitting: Family Medicine

## 2018-10-21 VITALS — BP 158/98 | HR 97 | Temp 97.7°F | Ht 60.0 in | Wt 240.0 lb

## 2018-10-21 DIAGNOSIS — R350 Frequency of micturition: Secondary | ICD-10-CM | POA: Diagnosis not present

## 2018-10-21 DIAGNOSIS — N390 Urinary tract infection, site not specified: Secondary | ICD-10-CM

## 2018-10-21 LAB — POCT URINALYSIS DIPSTICK
Bilirubin, UA: NEGATIVE
Glucose, UA: NEGATIVE
Ketones, UA: NEGATIVE
Nitrite, UA: POSITIVE
Protein, UA: POSITIVE — AB
Spec Grav, UA: 1.025 (ref 1.010–1.025)
Urobilinogen, UA: 0.2 E.U./dL
pH, UA: 5 (ref 5.0–8.0)

## 2018-10-21 MED ORDER — CIPROFLOXACIN HCL 500 MG PO TABS: 500.0000 mg | ORAL_TABLET | Freq: Two times a day (BID) | ORAL | 0 refills | Status: AC

## 2018-10-21 NOTE — Progress Notes (Signed)
Patient: Kayla Chan Female    DOB: 1932-01-12   85 y.o.   MRN: [id number] Visit Date: 10/21/2018  Today's Provider: Lelon Huh, MD   Chief Complaint  Patient presents with  . Urinary Tract Infection    pain and burning some with frequency for 4 days   Subjective:     HPI  Complains of burning with urination and frequency for the last 4-5 das. No fevers. No nausea, no vomiting.    Allergies  Allergen Reactions  . Amlodipine     dizziness  . Atorvastatin     Other reaction(s): Muscle Pain  . Colestid  [Colestipol Hcl]     Itching, insomnia  . Crestor  [Rosuvastatin Calcium]     Other reaction(s): Muscle Pain  . Doxycycline   . Fluvastatin Sodium     Other reaction(s): Muscle Pain  . Furosemide     Severe Cramping  . Penicillins   . Pravastatin Sodium     Numbness in legs  . Verapamil     Shortness of breath, swelling, palpations.   . Allopurinol Other (See Comments)    dizziness     Current Outpatient Medications:  .  allopurinol (ZYLOPRIM) 100 MG tablet, Take 0.5 tablets (50 mg total) by mouth daily., Disp: 30 tablet, Rfl: 12 .  aspirin 81 MG tablet, Take 81 mg by mouth daily., Disp: , Rfl:  .  chlorthalidone (HYGROTON) 25 MG tablet, Take 0.5 tablets (12.5 mg total) by mouth daily., Disp: 30 tablet, Rfl: 12 .  colchicine 0.6 MG tablet, TAKE 1 TABLET (0.6 MG TOTAL) BY MOUTH 2 (TWO) TIMES DAILY AS NEEDED (GOUT)., Disp: 30 tablet, Rfl: 5 .  ezetimibe (ZETIA) 10 MG tablet, TAKE 1 TABLET BY MOUTH IN THE MORNING, Disp: 30 tablet, Rfl: 11 .  hydrocortisone 2.5 % lotion, Apply to face once daily as needed for itchiness, Disp: , Rfl:  .  ketoconazole (NIZORAL) 2 % shampoo, Apply topically., Disp: , Rfl:  .  levothyroxine (SYNTHROID, LEVOTHROID) 75 MCG tablet, TAKE 1 TABLET BY MOUTH EVERY DAY, Disp: 90 tablet, Rfl: 4 .  lisinopril (PRINIVIL,ZESTRIL) 5 MG tablet, TAKE 1 TABLET BY MOUTH EVERY DAY, Disp: 90 tablet, Rfl: 4 .  Multiple Vitamins-Minerals  (MULTIVITAMIN ADULT PO), Take 1 tablet by mouth daily., Disp: , Rfl:  .  pimecrolimus (ELIDEL) 1 % cream, Apply topically., Disp: , Rfl:  .  Aspirin-Calcium Carbonate 81-777 MG TABS, Take by mouth., Disp: , Rfl:  .  cyclobenzaprine (FLEXERIL) 5 MG tablet, TAKE 1 TABLET BY MOUTH 3 (THREE) TIMES DAILY AS NEEDED FOR UP TO 10 DAYS FOR MUSCLE SPASMS., Disp: , Rfl: 0 .  HYDROcodone-homatropine (HYCODAN) 5-1.5 MG/5ML syrup, Take 5 mLs by mouth every 6 (six) hours as needed for cough. (Patient not taking: Reported on 10/21/2018), Disp: 120 mL, Rfl: 0 .  meloxicam (MOBIC) 15 MG tablet, Take 1 tablet (15 mg total) by mouth daily for 30 days. (Patient not taking: Reported on 10/21/2018), Disp: 60 tablet, Rfl: 1 .  mometasone (ELOCON) 0.1 % ointment, APPLY 1 APPLICATION DAILY AS NEEDED TOPICALLY. APPLY TO AFFECTED AREA (Patient not taking: Reported on 10/21/2018), Disp: 15 g, Rfl: 5 .  nitrofurantoin, macrocrystal-monohydrate, (MACROBID) 100 MG capsule, Take 1 capsule (100 mg total) by mouth 2 (two) times daily. (Patient not taking: Reported on 10/21/2018), Disp: 20 capsule, Rfl: 0 .  verapamil (VERELAN PM) 120 MG 24 hr capsule, Take by mouth., Disp: , Rfl:   Review of Systems  Constitutional:  Negative.   HENT: Negative.   Eyes: Negative.   Respiratory: Negative.   Cardiovascular: Negative.   Gastrointestinal: Negative.   Endocrine: Negative.   Genitourinary: Positive for dysuria and frequency.  Musculoskeletal: Negative.   Skin: Negative.   Allergic/Immunologic: Negative.   Neurological: Negative.   Hematological: Negative.   Psychiatric/Behavioral: Negative.     Social History   Tobacco Use  . Smoking status: Never Smoker  . Smokeless tobacco: Never Used  Substance Use Topics  . Alcohol use: Yes    Alcohol/week: 0.0 standard drinks    Comment: 1 glass of wine occasionally      Objective:   BP (!) 158/98 (BP Location: Left Arm, Patient Position: Sitting, Cuff Size: Large)   Pulse 97   Temp  97.7 F (36.5 C) (Oral)   Ht 5' (1.524 m)   Wt 240 lb (108.9 kg)   SpO2 97%   BMI 46.87 kg/m  Vitals:   10/21/18 1055  BP: (!) 158/98  Pulse: 97  Temp: 97.7 F (36.5 C)  TempSrc: Oral  SpO2: 97%  Weight: 240 lb (108.9 kg)  Height: 5' (1.524 m)     Physical Exam  General Appearance:    Alert, cooperative, no distress  Eyes:    PERRL, conjunctiva/corneas clear, EOM's intact       Lungs:     Clear to auscultation bilaterally, respirations unlabored  Heart:    Regular rate and rhythm  Abdomen:   . No CVA tenderness    Results for orders placed or performed in visit on 10/21/18  POCT Urinalysis Dipstick  Result Value Ref Range   Color, UA     Clarity, UA     Glucose, UA Negative Negative   Bilirubin, UA neg    Ketones, UA neg    Spec Grav, UA 1.025 1.010 - 1.025   Blood, UA non hemolyzed mederate    pH, UA 5.0 5.0 - 8.0   Protein, UA Positive (A) Negative   Urobilinogen, UA 0.2 0.2 or 1.0 E.U./dL   Nitrite, UA positive    Leukocytes, UA Moderate (2+) (A) Negative   Appearance     Odor         Assessment & Plan    1. Frequency of urination  - POCT Urinalysis Dipstick   2. Urinary tract infection without hematuria, site unspecified  - ciprofloxacin (CIPRO) 500 MG tablet; Take 1 tablet (500 mg total) by mouth 2 (two) times daily for 7 days.  Dispense: 14 tablet; Refill: 0 - Urine Culture    Lelon Huh, MD  [facility]

## 2018-10-21 NOTE — Patient Instructions (Signed)
.   Please review the attached list of medications and notify my office if there are any errors.   . Please bring all of your medications to every appointment so we can make sure that our medication list is the same as yours.   

## 2018-10-23 LAB — URINE CULTURE

## 2018-10-23 LAB — SPECIMEN STATUS REPORT

## 2018-10-25 ENCOUNTER — Telehealth: Payer: Self-pay

## 2018-10-25 NOTE — Telephone Encounter (Signed)
-----   Message from Birdie Sons, MD sent at 10/24/2018  7:26 PM EST ----- Urine culture shows infection sensitive to antibiotic that was prescribed. Symptoms should completely resolve by the time antibiotic is finished. Call back otherwise.

## 2018-10-25 NOTE — Telephone Encounter (Signed)
Patient advised as directed. Patient reports symptoms are getting better. Patient reports that she takes the Cipro in the morning and that she does fine with it but has notice that when she goes to bed at night she gets very itchy around her ankles since she started the antibiotic. Hasn't notice any rash and no sob. Reports that the itching goes away after 3-4 hours.

## 2018-10-26 NOTE — Telephone Encounter (Signed)
No answer; unable to LM.

## 2018-10-26 NOTE — Telephone Encounter (Signed)
I really don't think this is related to the antibiotic and I think she can finish this course especially since it is working for her infection.

## 2018-10-26 NOTE — Telephone Encounter (Signed)
Please review for Dr. Fisher.   Thanks,   -Kayla Chan  

## 2018-10-27 ENCOUNTER — Telehealth: Payer: Self-pay

## 2018-10-27 DIAGNOSIS — N3090 Cystitis, unspecified without hematuria: Secondary | ICD-10-CM

## 2018-10-27 NOTE — Telephone Encounter (Signed)
[id number] 

## 2018-10-27 NOTE — Telephone Encounter (Signed)
If she managed to get at least three days of cipro in that should be enough to treat the urinary tract infection and if she hasn't had anymore symptoms she shouldn't need an antibiotic.

## 2018-10-27 NOTE — Telephone Encounter (Signed)
Patient called back to get an update from the previous message she left earlier this week about the allergy to Cipro. I advised her of Adriana's message, and patient states that she doesn't want to take the Cipro. It caused her to have itching, sore throat and nausea. Patient stopped taking the Cipro for 2 days and symptoms resolved. Patient is requesting a different antibiotic to be called into the pharmacy. Her urinary symptoms have improved. Please call patient back. [facility] [address]

## 2018-10-28 MED ORDER — SULFAMETHOXAZOLE-TRIMETHOPRIM 800-160 MG PO TABS: 1.0000 | ORAL_TABLET | Freq: Two times a day (BID) | ORAL | 0 refills | Status: DC

## 2018-10-28 NOTE — Telephone Encounter (Signed)
Patient was advised via another encounter that was opened.

## 2018-10-28 NOTE — Telephone Encounter (Signed)
Patient states she took 5 days of the medication.Patient stated she not having any burning or abdominal pain. She is still having back and little urgency. Patient states she can not take the cipro any longer and would like another antibiotic send to the pharmacy.

## 2018-10-28 NOTE — Telephone Encounter (Signed)
Patient returning call requesting CMA give her a call back on her home number when available. KW

## 2018-10-28 NOTE — Telephone Encounter (Signed)
Bactrim 1 pill twice daily x 3 day sent. If she still has symptoms after this she will need to be seen again and provide another urine sample.

## 2018-10-28 NOTE — Telephone Encounter (Signed)
Tried calling patient. Left message to call back. 

## 2018-10-30 ENCOUNTER — Other Ambulatory Visit: Payer: Self-pay | Admitting: Family Medicine

## 2018-10-30 NOTE — Telephone Encounter (Signed)
Please check with patient to see if she is still taking doxazocin. Received refill request, but it was marked as not taking in computer.

## 2018-10-31 NOTE — Telephone Encounter (Signed)
Patient has already picked up medication and started taking it.

## 2018-11-02 NOTE — Telephone Encounter (Signed)
I spoke with Pt.  She read off all of her medication bottles and she does not have doxazocin or verapamil.  I advised pt to bring all of her medication bottles including supplements to her appointment 11/08/2018  Thanks,   -Mickel Baas

## 2018-11-05 ENCOUNTER — Other Ambulatory Visit: Payer: Self-pay | Admitting: Family Medicine

## 2018-11-08 ENCOUNTER — Ambulatory Visit (INDEPENDENT_AMBULATORY_CARE_PROVIDER_SITE_OTHER): Payer: PPO | Admitting: Family Medicine

## 2018-11-08 ENCOUNTER — Other Ambulatory Visit: Payer: Self-pay

## 2018-11-08 ENCOUNTER — Other Ambulatory Visit: Payer: Self-pay | Admitting: Family Medicine

## 2018-11-08 ENCOUNTER — Encounter: Payer: Self-pay | Admitting: Family Medicine

## 2018-11-08 ENCOUNTER — Ambulatory Visit: Payer: Self-pay | Admitting: Family Medicine

## 2018-11-08 VITALS — BP 131/73 | HR 80 | Temp 97.8°F | Ht 59.0 in | Wt 237.0 lb

## 2018-11-08 DIAGNOSIS — E039 Hypothyroidism, unspecified: Secondary | ICD-10-CM | POA: Diagnosis not present

## 2018-11-08 DIAGNOSIS — Z0000 Encounter for general adult medical examination without abnormal findings: Secondary | ICD-10-CM

## 2018-11-08 DIAGNOSIS — R7303 Prediabetes: Secondary | ICD-10-CM

## 2018-11-08 DIAGNOSIS — I10 Essential (primary) hypertension: Secondary | ICD-10-CM

## 2018-11-08 DIAGNOSIS — N390 Urinary tract infection, site not specified: Secondary | ICD-10-CM

## 2018-11-08 DIAGNOSIS — E785 Hyperlipidemia, unspecified: Secondary | ICD-10-CM | POA: Diagnosis not present

## 2018-11-08 LAB — POCT URINALYSIS DIPSTICK
Bilirubin, UA: NEGATIVE
Blood, UA: NEGATIVE
Glucose, UA: NEGATIVE
KETONES UA: NEGATIVE
Leukocytes, UA: NEGATIVE
Nitrite, UA: NEGATIVE
Protein, UA: NEGATIVE
Spec Grav, UA: 1.025 (ref 1.010–1.025)
Urobilinogen, UA: 0.2 E.U./dL
pH, UA: 5 (ref 5.0–8.0)

## 2018-11-08 MED ORDER — CHLORTHALIDONE 25 MG PO TABS: 12.5000 mg | ORAL_TABLET | Freq: Every day | ORAL | 3 refills | Status: DC

## 2018-11-08 MED ORDER — EZETIMIBE 10 MG PO TABS: 10.0000 mg | ORAL_TABLET | Freq: Every morning | ORAL | 4 refills | Status: DC

## 2018-11-08 MED ORDER — LEVOTHYROXINE SODIUM 75 MCG PO TABS: 75.0000 ug | ORAL_TABLET | Freq: Every day | ORAL | 4 refills | Status: DC

## 2018-11-08 NOTE — Progress Notes (Signed)
Patient: Kayla Chan, Female    DOB: 1931/11/26, 85 y.o.   MRN: [id number] Visit Date: 11/08/2018  Today's Provider: Lelon Huh, MD   Chief Complaint  Patient presents with  . Annual Exam   Subjective:     Complete Physical Kayla Chan is a 85 y.o. female. She feels well.  Pt is concerned Allopurinol is causing symptoms.  She reports having dizziness and "bad dreams" after taking it.  She reports exercising some. She reports she is sleeping well.  She was also recently treated for UTI and is concerned it may not have resolve.d she requests follow up u/a today to mae sure it has completely resolved.  -----------------------------------------------------------   Review of Systems  Constitutional: Negative.   HENT: Positive for hearing loss. Negative for congestion, dental problem, drooling, ear discharge, ear pain, facial swelling, mouth sores, nosebleeds, postnasal drip, rhinorrhea, sinus pressure, sinus pain, sneezing, sore throat, tinnitus, trouble swallowing and voice change.   Eyes: Negative.   Respiratory: Negative.   Cardiovascular: Negative.   Gastrointestinal: Negative.   Endocrine: Negative.   Genitourinary: Negative.   Musculoskeletal: Negative.   Skin: Negative.   Allergic/Immunologic: Negative.   Neurological: Negative.   Hematological: Negative.   Psychiatric/Behavioral: Negative.     Social History   Socioeconomic History  . Marital status: Married    Spouse name: Not on file  . Number of children: 0  . Years of education: Not on file  . Highest education level: Some college, no degree  Occupational History  . Occupation: Retired  Scientific laboratory technician  . Financial resource strain: Not hard at all  . Food insecurity:    Worry: Never true    Inability: Never true  . Transportation needs:    Medical: No    Non-medical: No  Tobacco Use  . Smoking status: Never Smoker  . Smokeless tobacco: Never Used  Substance and Sexual Activity  . Alcohol  use: Yes    Alcohol/week: 0.0 standard drinks    Comment: 1 glass of wine occasionally  . Drug use: No  . Sexual activity: Not on file  Lifestyle  . Physical activity:    Days per week: Not on file    Minutes per session: Not on file  . Stress: Not at all  Relationships  . Social connections:    Talks on phone: Not on file    Gets together: Not on file    Attends religious service: Not on file    Active member of club or organization: Not on file    Attends meetings of clubs or organizations: Not on file    Relationship status: Not on file  . Intimate partner violence:    Fear of current or ex partner: Not on file    Emotionally abused: Not on file    Physically abused: Not on file    Forced sexual activity: Not on file  Other Topics Concern  . Not on file  Social History Narrative  . Not on file    Past Medical History:  Diagnosis Date  . Cancer ([place])    BCC on nose  . Hypertension      Patient Active Problem List   Diagnosis Date Noted  . Limited mobility 09/03/2017  . Fatigue 02/04/2016  . Palpitations 02/04/2016  . Gout 09/24/2015  . Hyperuricemia 08/20/2015  . Back pain 06/28/2015  . Constipation 06/28/2015  . Eczema 06/28/2015  . Edema 06/28/2015  . Pre-diabetes 06/28/2015  . Intertrigo 06/28/2015  .  PAC (premature atrial contraction) 06/28/2015  . Premature heartbeats 06/28/2015  . History of basal cell cancer 02/18/2015  . Fatty infiltration of liver 05/31/2013  . Hypothyroidism 01/15/2011  . Arthropathy of pelvic region and thigh 08/01/2008  . HLD (hyperlipidemia) 03/13/2008  . Arthritis, degenerative 01/26/2007  . Hypertension 12/13/2006  . Personal history of venous thrombosis and embolism 05/24/2006    Past Surgical History:  Procedure Laterality Date  . 24 hour Holter Monitor  01/2013   4550 ectopic beats with 344 superventricular bigemminy events  . Abdominal Ultrasound  05/31/2013   RUQ. Fatty Liver  . APPENDECTOMY  1950   [place]  .  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, [place]; Right    Her family history includes Bladder Cancer in her brother; Heart Problems in her father; Kidney cancer in her sister; Stroke in her mother.   Current Outpatient Medications:  .  allopurinol (ZYLOPRIM) 100 MG tablet, Take 0.5 tablets (50 mg total) by mouth daily., Disp: 30 tablet, Rfl: 12 .  aspirin 81 MG tablet, Take 81 mg by mouth daily., Disp: , Rfl:  .  chlorthalidone (HYGROTON) 25 MG tablet, Take 0.5 tablets (12.5 mg total) by mouth daily., Disp: 30 tablet, Rfl: 12 .  colchicine 0.6 MG tablet, TAKE 1 TABLET (0.6 MG TOTAL) BY MOUTH 2 (TWO) TIMES DAILY AS NEEDED (GOUT)., Disp: 30 tablet, Rfl: 5 .  ezetimibe (ZETIA) 10 MG tablet, TAKE 1 TABLET BY MOUTH IN THE MORNING, Disp: 90 tablet, Rfl: 3 .  hydrocortisone 2.5 % lotion, Apply to face once daily as needed for itchiness, Disp: , Rfl:  .  ketoconazole (NIZORAL) 2 % shampoo, Apply topically., Disp: , Rfl:  .  levothyroxine (SYNTHROID, LEVOTHROID) 75 MCG tablet, TAKE 1 TABLET BY MOUTH EVERY DAY, Disp: 90 tablet, Rfl: 4 .  lisinopril (PRINIVIL,ZESTRIL) 5 MG tablet, TAKE 1 TABLET BY MOUTH EVERY DAY, Disp: 90 tablet, Rfl: 4 .  Multiple Vitamins-Minerals (MULTIVITAMIN ADULT PO), Take 1 tablet by mouth daily., Disp: , Rfl:  .  pimecrolimus (ELIDEL) 1 % cream, Apply topically., Disp: , Rfl:  .  Aspirin-Calcium Carbonate 81-777 MG TABS, Take by mouth., Disp: , Rfl:  .  cyclobenzaprine (FLEXERIL) 5 MG tablet, TAKE 1 TABLET BY MOUTH 3 (THREE) TIMES DAILY AS NEEDED FOR UP TO 10 DAYS FOR MUSCLE SPASMS., Disp: , Rfl: 0 .  HYDROcodone-homatropine (HYCODAN) 5-1.5 MG/5ML syrup, Take 5 mLs by mouth every 6 (six) hours as needed for cough. (Patient not taking: Reported on 10/21/2018), Disp: 120 mL, Rfl: 0 .  meloxicam (MOBIC) 15 MG tablet, Take 1 tablet (15 mg total) by mouth daily for 30 days. (Patient not taking: Reported on 10/21/2018), Disp: 60 tablet, Rfl: 1 .  mometasone  (ELOCON) 0.1 % ointment, APPLY 1 APPLICATION DAILY AS NEEDED TOPICALLY. APPLY TO AFFECTED AREA (Patient not taking: Reported on 10/21/2018), Disp: 15 g, Rfl: 5 .  nitrofurantoin, macrocrystal-monohydrate, (MACROBID) 100 MG capsule, Take 1 capsule (100 mg total) by mouth 2 (two) times daily. (Patient not taking: Reported on 10/21/2018), Disp: 20 capsule, Rfl: 0 .  verapamil (VERELAN PM) 120 MG 24 hr capsule, Take by mouth., Disp: , Rfl:   Patient Care Team: Birdie Sons, MD as PCP - General (Family Medicine) Regino Schultze, MD as Referring Physician (Dermatology) Birder Robson, MD as Referring Physician (Ophthalmology) Edrick Kins, DPM as Consulting Physician (Podiatry) Hessie Knows, MD as Consulting Physician (Orthopedic Surgery)     Objective:    Vitals: BP 131/73 (  BP Location: Left Wrist, Patient Position: Sitting, Cuff Size: Normal)   Pulse 80   Temp 97.8 F (36.6 C) (Oral)   Ht 4\' 11"  (1.499 m)   Wt 237 lb (107.5 kg)   BMI 47.87 kg/m   Physical Exam   General Appearance:    Alert, cooperative, no distress, appears stated age, morbidly obese  Head:    Normocephalic, without obvious abnormality, atraumatic  Eyes:    PERRL, conjunctiva/corneas clear, EOM's intact, fundi    benign, both eyes  Ears:    Normal TM's and external ear canals, both ears  Nose:   Nares normal, septum midline, mucosa normal, no drainage    or sinus tenderness  Throat:   Lips, mucosa, and tongue normal; teeth and gums normal  Neck:   Supple, symmetrical, trachea midline, no adenopathy;    thyroid:  no enlargement/tenderness/nodules; no carotid   bruit or JVD  Back:     Symmetric, no curvature, ROM normal, no CVA tenderness  Lungs:     Clear to auscultation bilaterally, respirations unlabored  Chest Wall:    No tenderness or deformity   Heart:    Regular rate and rhythm, S1 and S2 normal, no murmur, rub   or gallop  Breast Exam:    deferred  Abdomen:     Soft, non-tender, bowel sounds  active all four quadrants,    no masses, no organomegaly  Pelvic:    deferred  Extremities:   Extremities normal, atraumatic, no cyanosis or edema  Pulses:   2+ and symmetric all extremities  Skin:   Skin color, texture, turgor normal, no rashes or lesions  Lymph nodes:   Cervical, supraclavicular, and axillary nodes normal  Neurologic:   CNII-XII intact, normal strength, sensation and reflexes    throughout    Results for orders placed or performed in visit on 11/08/18  POCT Urinalysis Dipstick  Result Value Ref Range   Color, UA yellow    Clarity, UA clear    Glucose, UA Negative Negative   Bilirubin, UA negative    Ketones, UA negative    Spec Grav, UA 1.025 1.010 - 1.025   Blood, UA negative    pH, UA 5.0 5.0 - 8.0   Protein, UA Negative Negative   Urobilinogen, UA 0.2 0.2 or 1.0 E.U./dL   Nitrite, UA negative    Leukocytes, UA Negative Negative   Appearance     Odor         Assessment & Plan:    Annual Physical Reviewed patient's Family Medical History Reviewed and updated list of patient's medical providers Assessment of cognitive impairment was done Assessed patient's functional ability Established a written schedule for health screening services Health Risk Assessent Completed and Reviewed  Exercise Activities and Dietary recommendations Goals    . Cut back on carbohydrates     Recommend cutting back on the bad carbohydrates and focusing on eating more healthy carbohydrates. Avoid eating large consumption's of bread and butter.     . Increase water intake     Starting 08/19/16, I will continue to drink 5 glasses of water a day.     . Reduce caffeine intake     Recommend to cut back on caffinated beverages and increase water consumption.        Immunization History  Administered Date(s) Administered  . Influenza Split 06/11/2007, 06/09/2008, 05/30/2009  . Influenza, High Dose Seasonal PF 05/31/2015, 05/30/2016, 06/10/2017, 07/02/2018  .  Influenza,inj,Quad PF,6+ Mos 06/20/2013, 05/09/2014  .  Pneumococcal Conjugate-13 05/09/2014  . Pneumococcal Polysaccharide-23 05/24/2006    Health Maintenance  Topic Date Due  . TETANUS/TDAP  05/07/1953  . DEXA SCAN  08/19/2026 (Originally 05/08/1999)  . INFLUENZA VACCINE  Completed  . PNA vac Low Risk Adult  Completed     Discussed health benefits of physical activity, and encouraged her to engage in regular exercise appropriate for her age and condition.     2. Urinary tract infection without hematuria, site unspecified Resolved.    3. Essential hypertension Stable doing well on current medications. refill- chlorthalidone (HYGROTON) 25 MG tablet; Take 0.5 tablets (12.5 mg total) by mouth daily.  Dispense: 45 tablet; Refill: 3 - Renal function panel  4. Hypothyroidism, unspecified type refill- levothyroxine (SYNTHROID, LEVOTHROID) 75 MCG tablet; Take 1 tablet (75 mcg total) by mouth daily.  Dispense: 90 tablet; Refill: 4 - TSH - Uric acid  5. Hyperlipidemia, unspecified hyperlipidemia type continue - ezetimibe (ZETIA) 10 MG tablet; Take 1 tablet (10 mg total) by mouth every morning.  Dispense: 90 tablet; Refill: 4  6. Pre-diabetes  - Hemoglobin A1c  7. Morbid obesity ([place]) Counseled regarding prudent diet and regular exercise.     Lelon Huh, MD  [facility]

## 2018-11-08 NOTE — Patient Instructions (Signed)
.   Please review the attached list of medications and notify my office if there are any errors.   . Please bring all of your medications to every appointment so we can make sure that our medication list is the same as yours.    Try taking Tart Cherry Extract 1,000mg  twice a day in place of allopurinol to help prevent gout

## 2018-11-09 ENCOUNTER — Telehealth: Payer: Self-pay

## 2018-11-09 ENCOUNTER — Encounter: Payer: Self-pay | Admitting: Family Medicine

## 2018-11-09 DIAGNOSIS — N1831 Chronic kidney disease, stage 3a: Secondary | ICD-10-CM | POA: Insufficient documentation

## 2018-11-09 DIAGNOSIS — N183 Chronic kidney disease, stage 3 unspecified: Secondary | ICD-10-CM | POA: Insufficient documentation

## 2018-11-09 DIAGNOSIS — N1832 Chronic kidney disease, stage 3b: Secondary | ICD-10-CM | POA: Insufficient documentation

## 2018-11-09 LAB — RENAL FUNCTION PANEL
Albumin: 3.9 g/dL (ref 3.6–4.6)
BUN/Creatinine Ratio: 27 (ref 12–28)
BUN: 30 mg/dL — ABNORMAL HIGH (ref 8–27)
CO2: 19 mmol/L — ABNORMAL LOW (ref 20–29)
Calcium: 10 mg/dL (ref 8.7–10.3)
Chloride: 106 mmol/L (ref 96–106)
Creatinine, Ser: 1.11 mg/dL — ABNORMAL HIGH (ref 0.57–1.00)
GFR calc Af Amer: 53 mL/min/{1.73_m2} — ABNORMAL LOW (ref >59)
GFR calc non Af Amer: 46 mL/min/{1.73_m2} — ABNORMAL LOW (ref >59)
Glucose: 90 mg/dL (ref 65–99)
Phosphorus: 3.8 mg/dL (ref 3.0–4.3)
Potassium: 4.6 mmol/L (ref 3.5–5.2)
SODIUM: 142 mmol/L (ref 134–144)

## 2018-11-09 LAB — TSH: TSH: 2.19 u[IU]/mL (ref 0.450–4.500)

## 2018-11-09 LAB — URIC ACID: Uric Acid: 7.7 mg/dL — ABNORMAL HIGH (ref 2.5–7.1)

## 2018-11-09 LAB — HEMOGLOBIN A1C
Est. average glucose Bld gHb Est-mCnc: 128 mg/dL
Hgb A1c MFr Bld: 6.1 % — ABNORMAL HIGH (ref 4.8–5.6)

## 2018-11-09 NOTE — Telephone Encounter (Signed)
Pt advised.  Apt made for 05/17/2019  Thanks,   -Mickel Baas

## 2018-11-09 NOTE — Telephone Encounter (Signed)
-----   Message from Birdie Sons, MD sent at 11/09/2018  8:20 AM EDT ----- Labs good. Kidney functions better. a1c is stable at 6.1 (pre-diabetes). Uric acid is better at 7.7. can try changing allopurinol to OTC Tart cherry Extract like we talked about at office visit. Continue current medications.  Schedule follow up for hypertension in six months.

## 2018-11-17 ENCOUNTER — Other Ambulatory Visit: Payer: PPO

## 2018-11-23 DIAGNOSIS — E66813 Obesity, class 3: Secondary | ICD-10-CM | POA: Insufficient documentation

## 2019-01-31 ENCOUNTER — Telehealth: Payer: Self-pay | Admitting: Family Medicine

## 2019-01-31 DIAGNOSIS — N3090 Cystitis, unspecified without hematuria: Secondary | ICD-10-CM

## 2019-01-31 MED ORDER — SULFAMETHOXAZOLE-TRIMETHOPRIM 800-160 MG PO TABS: 1.0000 | ORAL_TABLET | Freq: Two times a day (BID) | ORAL | 0 refills | Status: DC

## 2019-01-31 NOTE — Telephone Encounter (Signed)
Pt says she thinks she is getting another UTI.  She was just in about a month ago.  She request you send her a prescription of what she took last time.  She cannot take Cipro.    [facility]  [address]  [facility]  [phone]  Thanks Con Memos

## 2019-01-31 NOTE — Telephone Encounter (Signed)
Does she need an appointment?  Thanks,   -Mickel Baas

## 2019-05-16 ENCOUNTER — Other Ambulatory Visit: Payer: Self-pay

## 2019-05-16 ENCOUNTER — Ambulatory Visit (INDEPENDENT_AMBULATORY_CARE_PROVIDER_SITE_OTHER): Payer: PPO | Admitting: Family Medicine

## 2019-05-16 ENCOUNTER — Encounter: Payer: Self-pay | Admitting: Family Medicine

## 2019-05-16 VITALS — BP 119/64 | HR 76 | Temp 96.9°F | Resp 16 | Wt 245.6 lb

## 2019-05-16 DIAGNOSIS — R7303 Prediabetes: Secondary | ICD-10-CM

## 2019-05-16 DIAGNOSIS — Z23 Encounter for immunization: Secondary | ICD-10-CM

## 2019-05-16 DIAGNOSIS — N3090 Cystitis, unspecified without hematuria: Secondary | ICD-10-CM

## 2019-05-16 DIAGNOSIS — R300 Dysuria: Secondary | ICD-10-CM | POA: Diagnosis not present

## 2019-05-16 LAB — POCT URINALYSIS DIPSTICK
Bilirubin, UA: NEGATIVE
Blood, UA: NEGATIVE
Glucose, UA: NEGATIVE
Ketones, UA: NEGATIVE
Nitrite, UA: NEGATIVE
Protein, UA: POSITIVE — AB
Spec Grav, UA: 1.02 (ref 1.010–1.025)
Urobilinogen, UA: 0.2 E.U./dL
pH, UA: 5 (ref 5.0–8.0)

## 2019-05-16 LAB — POCT GLYCOSYLATED HEMOGLOBIN (HGB A1C): Hemoglobin A1C: 6 % — AB (ref 4.0–5.6)

## 2019-05-16 MED ORDER — SULFAMETHOXAZOLE-TRIMETHOPRIM 800-160 MG PO TABS: 1.0000 | ORAL_TABLET | Freq: Two times a day (BID) | ORAL | 0 refills | Status: AC

## 2019-05-16 NOTE — Progress Notes (Signed)
Patient: Kayla Chan Female    DOB: 12-11-1931   85 y.o.   MRN: [id number] Visit Date: 05/16/2019  Today's Provider: Lelon Huh, MD   Chief Complaint  Patient presents with  . Hypertension  . Hypothyroidism  . Hyperlipidemia  . Hyperglycemia  . Dysuria   Subjective:     Dysuria  This is a new problem. The current episode started in the past 7 days. The problem occurs intermittently. The problem has been unchanged. The patient is experiencing no pain. There has been no fever. Associated symptoms include flank pain. Pertinent negatives include no chills, discharge, frequency, hematuria, hesitancy, nausea, possible pregnancy, sweats, urgency or vomiting. She has tried nothing for the symptoms.   Urinalysis    Component Value Date/Time   COLORURINE Yellow 08/05/2013 1243   APPEARANCEUR Cloudy 08/05/2013 1243   LABSPEC 1.019 08/05/2013 1243   PHURINE 5.0 08/05/2013 1243   GLUCOSEU Negative 08/05/2013 1243   HGBUR Negative 08/05/2013 1243   BILIRUBINUR negative 05/16/2019 1415   BILIRUBINUR Negative 08/05/2013 1243   KETONESUR Negative 08/05/2013 1243   PROTEINUR Positive (A) 05/16/2019 1415   PROTEINUR Negative 08/05/2013 1243   UROBILINOGEN 0.2 05/16/2019 1415   NITRITE negative 05/16/2019 1415   NITRITE Negative 08/05/2013 1243   LEUKOCYTESUR Small (1+) (A) 05/16/2019 1415   LEUKOCYTESUR 3+ 08/05/2013 1243      Hypertension, follow-up:  BP Readings from Last 3 Encounters:  05/16/19 119/64  11/08/18 131/73  10/21/18 (!) 158/98    She was last seen for hypertension 6 months ago.  BP at that visit was 131/73. Management changes since that visit include none. She reports excellent compliance with treatment. She is not having side effects.  She is not exercising due to knee operation. She is not adherent to low salt diet.   Outside blood pressures are systolic in the [id number] and diastolic in the [id number]. She is experiencing lower extremity edema.  Patient  denies chest pain, chest pressure/discomfort, claudication, dyspnea, exertional chest pressure/discomfort, fatigue, irregular heart beat, near-syncope, orthopnea, palpitations, paroxysmal nocturnal dyspnea, syncope and tachypnea.   Cardiovascular risk factors include advanced age (older than 22 for men, 53 for women), hypertension and obesity (BMI >= 30 kg/m2).  Use of agents associated with hypertension: NSAIDS.     Weight trend: increasing steadily Wt Readings from Last 3 Encounters:  05/16/19 245 lb 9.6 oz (111.4 kg)  11/08/18 237 lb (107.5 kg)  10/21/18 240 lb (108.9 kg)    Current diet: well balanced  ------------------------------------------------------------------------  Hypothyroid, follow-up:  TSH  Date Value Ref Range Status  11/08/2018 2.190 0.450 - 4.500 uIU/mL Final  03/07/2018 1.870 0.450 - 4.500 uIU/mL Final  09/03/2017 2.140 0.450 - 4.500 uIU/mL Final   Wt Readings from Last 3 Encounters:  05/16/19 245 lb 9.6 oz (111.4 kg)  11/08/18 237 lb (107.5 kg)  10/21/18 240 lb (108.9 kg)    She was last seen for hypothyroid 6 months ago.  Management since that visit includes none. She reports excellent compliance with treatment. She is not having side effects.  She is not exercising. She is experiencing none She denies change in energy level, diarrhea, heat / cold intolerance, nervousness, palpitations and weight changes Weight trend: increasing steadily  ------------------------------------------------------------------------  Lipid/Cholesterol, Follow-up:   Last seen for this6 months ago.  Management changes since that visit include none. . Last Lipid Panel:    Component Value Date/Time   CHOL 235 (H) 07/11/2018 1425   TRIG 281 (H) 07/11/2018  1425   HDL 45 07/11/2018 1425   CHOLHDL 5.2 (H) 07/11/2018 1425   LDLCALC 134 (H) 07/11/2018 1425    Risk factors for vascular disease include diabetes mellitus, hypercholesterolemia and hypertension  She  reports excellent compliance with treatment. She is not having side effects.  Current symptoms include paresthesia of the feet and have been unchanged. Weight trend: increasing steadily Prior visit with dietician: no Current diet: not asked Current exercise: none  Wt Readings from Last 3 Encounters:  05/16/19 245 lb 9.6 oz (111.4 kg)  11/08/18 237 lb (107.5 kg)  10/21/18 240 lb (108.9 kg)    -------------------------------------------------------------------  Prediabetes, Follow-up:   Lab Results  Component Value Date   HGBA1C 6.0 (A) 05/16/2019   HGBA1C 6.1 (H) 11/08/2018   HGBA1C 6.1 (H) 03/07/2018   GLUCOSE 90 11/08/2018   GLUCOSE 105 (H) 07/11/2018   GLUCOSE 99 03/07/2018    Last seen for for this6 months ago.  Management since that visit includes none. Current symptoms include paresthesia of the feet and have been unchanged.  Weight trend: increasing steadily Prior visit with dietician: no Current diet: not asked Current exercise: none  Pertinent Labs:    Component Value Date/Time   CHOL 235 (H) 07/11/2018 1425   TRIG 281 (H) 07/11/2018 1425   CHOLHDL 5.2 (H) 07/11/2018 1425   CREATININE 1.11 (H) 11/08/2018 1543   CREATININE 1.10 08/05/2013 1238    Wt Readings from Last 3 Encounters:  05/16/19 245 lb 9.6 oz (111.4 kg)  11/08/18 237 lb (107.5 kg)  10/21/18 240 lb (108.9 kg)    Allergies  Allergen Reactions  . Amlodipine     dizziness  . Atorvastatin     Other reaction(s): Muscle Pain  . Colestid  [Colestipol Hcl]     Itching, insomnia  . Crestor  [Rosuvastatin Calcium]     Other reaction(s): Muscle Pain  . Doxycycline   . Fluvastatin Sodium     Other reaction(s): Muscle Pain  . Furosemide     Severe Cramping  . Pravastatin Sodium     Numbness in legs  . Verapamil     Shortness of breath, swelling, palpations.   . Allopurinol Other (See Comments)    dizziness  . Penicillins Rash    Took for pneumonia when she was 5 and mader her mouth  break out     Current Outpatient Medications:  .  aspirin 81 MG tablet, Take 81 mg by mouth daily., Disp: , Rfl:  .  Aspirin-Calcium Carbonate 81-777 MG TABS, Take by mouth., Disp: , Rfl:  .  chlorthalidone (HYGROTON) 25 MG tablet, Take 0.5 tablets (12.5 mg total) by mouth daily., Disp: 45 tablet, Rfl: 3 .  colchicine 0.6 MG tablet, TAKE 1 TABLET (0.6 MG TOTAL) BY MOUTH 2 (TWO) TIMES DAILY AS NEEDED (GOUT)., Disp: 30 tablet, Rfl: 5 .  cyclobenzaprine (FLEXERIL) 5 MG tablet, TAKE 1 TABLET BY MOUTH 3 (THREE) TIMES DAILY AS NEEDED FOR UP TO 10 DAYS FOR MUSCLE SPASMS., Disp: , Rfl: 0 .  ezetimibe (ZETIA) 10 MG tablet, Take 1 tablet (10 mg total) by mouth every morning., Disp: 90 tablet, Rfl: 4 .  hydrocortisone 2.5 % lotion, Apply to face once daily as needed for itchiness, Disp: , Rfl:  .  ketoconazole (NIZORAL) 2 % shampoo, Apply topically., Disp: , Rfl:  .  levothyroxine (SYNTHROID, LEVOTHROID) 75 MCG tablet, Take 1 tablet (75 mcg total) by mouth daily., Disp: 90 tablet, Rfl: 4 .  lisinopril (PRINIVIL,ZESTRIL) 5 MG tablet,  TAKE 1 TABLET BY MOUTH EVERY DAY, Disp: 90 tablet, Rfl: 4 .  mometasone (ELOCON) 0.1 % ointment, APPLY 1 APPLICATION DAILY AS NEEDED TOPICALLY. APPLY TO AFFECTED AREA, Disp: 15 g, Rfl: 5 .  Multiple Vitamins-Minerals (MULTIVITAMIN ADULT PO), Take 1 tablet by mouth daily., Disp: , Rfl:  .  verapamil (VERELAN PM) 120 MG 24 hr capsule, Take by mouth., Disp: , Rfl:   Review of Systems  Constitutional: Negative for chills.  Gastrointestinal: Negative for nausea and vomiting.  Genitourinary: Positive for dysuria and flank pain. Negative for frequency, hematuria, hesitancy and urgency.    Social History   Tobacco Use  . Smoking status: Never Smoker  . Smokeless tobacco: Never Used  Substance Use Topics  . Alcohol use: Yes    Alcohol/week: 0.0 standard drinks    Comment: 1 glass of wine occasionally      Objective:   BP 119/64   Pulse 76   Temp (!) 96.9 F (36.1 C)  (Oral)   Resp 16   Wt 245 lb 9.6 oz (111.4 kg)   SpO2 99%   BMI 49.61 kg/m  Vitals:   05/16/19 1407  BP: 119/64  Pulse: 76  Resp: 16  Temp: (!) 96.9 F (36.1 C)  TempSrc: Oral  SpO2: 99%  Weight: 245 lb 9.6 oz (111.4 kg)  Body mass index is 49.61 kg/m.   Physical Exam   General Appearance:    Alert, cooperative, no distress  Eyes:    PERRL, conjunctiva/corneas clear, EOM's intact       Lungs:     Clear to auscultation bilaterally, respirations unlabored  Heart:    Normal heart rate. Normal rhythm. No murmurs, rubs, or gallops.   MS:   All extremities are intact.   Neurologic:   Awake, alert, oriented x 3. No apparent focal neurological           defect.       Results for orders placed or performed in visit on 05/16/19  POCT glycosylated hemoglobin (Hb A1C)  Result Value Ref Range   Hemoglobin A1C 6.0 (A) 4.0 - 5.6 %   HbA1c POC (<> result, manual entry)     HbA1c, POC (prediabetic range)     HbA1c, POC (controlled diabetic range)    POCT urinalysis dipstick  Result Value Ref Range   Color, UA dark yellow    Clarity, UA cloudy    Glucose, UA Negative Negative   Bilirubin, UA negative    Ketones, UA negative    Spec Grav, UA 1.020 1.010 - 1.025   Blood, UA negative    pH, UA 5.0 5.0 - 8.0   Protein, UA Positive (A) Negative   Urobilinogen, UA 0.2 0.2 or 1.0 E.U./dL   Nitrite, UA negative    Leukocytes, UA Small (1+) (A) Negative   Appearance     Odor         Assessment & Plan    1. Pre-diabetes Well controlled.  Continue current medications.    2. Need for influenza vaccination  - Flu Vaccine QUAD High Dose(Fluad)  3. Cystitis  - sulfamethoxazole-trimethoprim (BACTRIM DS) 800-160 MG tablet; Take 1 tablet by mouth 2 (two) times daily for 7 days.  Dispense: 14 tablet; Refill: 0 - Urine Culture  Follow up 6 months.   The entirety of the information documented in the History of Present Illness, Review of Systems and Physical Exam were personally  obtained by me. Portions of this information were initially documented by Nunzio Cory  Maryan Char, CMA and reviewed by me for thoroughness and accuracy.      Lelon Huh, MD  [facility]

## 2019-05-16 NOTE — Patient Instructions (Addendum)
.   Please review the attached list of medications and notify my office if there are any errors.   . Please bring all of your medications to every appointment so we can make sure that our medication list is the same as yours.   . Try OTC Biotene to help with dry mouth

## 2019-05-19 ENCOUNTER — Other Ambulatory Visit: Payer: Self-pay | Admitting: Family Medicine

## 2019-05-19 ENCOUNTER — Telehealth: Payer: Self-pay

## 2019-05-19 LAB — URINE CULTURE

## 2019-05-19 NOTE — Telephone Encounter (Signed)
[id number] 

## 2019-05-19 NOTE — Telephone Encounter (Signed)
-----   Message from Birdie Sons, MD sent at 05/19/2019 11:44 AM EDT ----- Urine culture shows infection sensitive to antibiotic that was prescribed. Symptoms should completely resolve by the time antibiotic is finished. Call back otherwise.

## 2019-05-22 NOTE — Telephone Encounter (Signed)
Patient advised and verbally voiced understanding.  

## 2019-05-29 DIAGNOSIS — Z23 Encounter for immunization: Secondary | ICD-10-CM | POA: Diagnosis not present

## 2019-05-29 DIAGNOSIS — N3090 Cystitis, unspecified without hematuria: Secondary | ICD-10-CM | POA: Diagnosis not present

## 2019-05-29 DIAGNOSIS — R7303 Prediabetes: Secondary | ICD-10-CM | POA: Diagnosis not present

## 2019-05-30 ENCOUNTER — Telehealth: Payer: Self-pay | Admitting: Family Medicine

## 2019-05-30 NOTE — Telephone Encounter (Signed)
Please review. This medication is not listed in patient's chart as an active medication. It also looks like it was entered as a historical medication in the past.

## 2019-05-30 NOTE — Telephone Encounter (Signed)
[facility] faxed refill request for the following medications:  pimecrolimus (ELIDEL) 1 % cream    Please advise.

## 2019-05-31 MED ORDER — PIMECROLIMUS 1 % EX CREA: TOPICAL_CREAM | Freq: Two times a day (BID) | CUTANEOUS | 1 refills | Status: DC

## 2019-06-29 DIAGNOSIS — H2513 Age-related nuclear cataract, bilateral: Secondary | ICD-10-CM | POA: Diagnosis not present

## 2019-08-02 ENCOUNTER — Telehealth: Payer: Self-pay | Admitting: Physician Assistant

## 2019-08-02 ENCOUNTER — Ambulatory Visit
Admission: RE | Admit: 2019-08-02 | Discharge: 2019-08-02 | Disposition: A | Discharge status: Home / Self Care | Payer: PPO | Source: Ambulatory Visit | Attending: Family Medicine | Admitting: Family Medicine

## 2019-08-02 ENCOUNTER — Ambulatory Visit (INDEPENDENT_AMBULATORY_CARE_PROVIDER_SITE_OTHER): Payer: PPO | Admitting: Family Medicine

## 2019-08-02 ENCOUNTER — Ambulatory Visit: Payer: Self-pay

## 2019-08-02 ENCOUNTER — Other Ambulatory Visit: Payer: Self-pay

## 2019-08-02 VITALS — BP 150/88 | HR 88 | Temp 97.1°F

## 2019-08-02 DIAGNOSIS — M545 Low back pain, unspecified: Secondary | ICD-10-CM

## 2019-08-02 DIAGNOSIS — S3992XA Unspecified injury of lower back, initial encounter: Secondary | ICD-10-CM | POA: Diagnosis not present

## 2019-08-02 DIAGNOSIS — S3219XA Other fracture of sacrum, initial encounter for closed fracture: Secondary | ICD-10-CM | POA: Diagnosis not present

## 2019-08-02 DIAGNOSIS — Z6841 Body Mass Index (BMI) 40.0 and over, adult: Secondary | ICD-10-CM

## 2019-08-02 MED ORDER — TRAMADOL HCL 50 MG PO TABS: 50.0000 mg | ORAL_TABLET | Freq: Four times a day (QID) | ORAL | 1 refills | Status: DC | PRN

## 2019-08-02 NOTE — Telephone Encounter (Signed)
FYI-Patient is being seen today by you

## 2019-08-02 NOTE — Telephone Encounter (Signed)
Patient called stating that she had a fall Saturday getting out of bed.  She hit her head and tail bone.  She calling today because she has pain at 9 to her tail bone area. She states she is unsteady when she walks and was reaching for her cane when this fall happened. She rates her pain at 9. She had treated OTC medication but continues to have pain.  She states she has rt hip pain as well from the fall and a bruise to her right elbow. Per protocol DT was completed and appointment scheduled.  Care advice read to patient and she verbalized understanding.  Reason for Disposition . [1] SEVERE pain AND [2] not improved 2 hours after pain medicine/ice packs  Answer Assessment - Initial Assessment Questions 1. SYMPTOM: "What is the main symptom you are concerned about?" (e.g., weakness, numbness)     shakey 2. ONSET: "When did this start?" (minutes, hours, days; while sleeping)    Saturday morning after fall 3. LAST NORMAL: "When was the last time you were normal (no symptoms)?"    today 4. PATTERN "Does this come and go, or has it been constant since it started?"  "Is it present now?"    Ok when sitting off balance when walking 5. CARDIAC SYMPTOMS: "Have you had any of the following symptoms: chest pain, difficulty breathing, palpitations?"     no 6. NEUROLOGIC SYMPTOMS: "Have you had any of the following symptoms: headache, dizziness, vision loss, double vision, changes in speech, unsteady on your feet?"     Unsteady on feet 7. OTHER SYMPTOMS: "Do you have any other symptoms?"    No just last night 8. PREGNANCY: "Is there any chance you are pregnant?" "When was your last menstrual period?"    N/A  Answer Assessment - Initial Assessment Questions 1. MECHANISM: "How did the injury happen?"       Fall on saturday 2. ONSET: "When did the injury happen?" (Minutes or hours ago)      saturday 3. LOCATION: "Where is the injury located?"     Tail bone 4. SEVERITY: "Can you sit?" "Can you walk?"    Hard to sit, off balance with walk 5. PAIN: "Is there pain?" If so, ask: "How bad is the pain?"    (Scale 1-10; or mild, moderate, severe)     9 6. SIZE: For bruises, or swelling, ask: "How large is it?" (e.g., inches or centimeters)      No only rt arm on elbow 7. OTHER SYMPTOMS: "Do you have any other symptoms?" (e.g., numbness, back pain)     Rt hip hurts but better today 8. PREGNANCY: "Is there any chance you are pregnant?" "When was your last menstrual period?"    N/A  Protocols used: TAILBONE INJURY-A-AH, NEUROLOGIC DEFICIT-A-AH

## 2019-08-02 NOTE — Telephone Encounter (Signed)
Called patient but received voice messaging system. Leaved message to call back. Xrays show minimally displaced (4 mm) sacral fracture and lumbar spine with DDD but otherwise no trauma. She was given tramadol by Dr. Rosanna Randy today during office visit which she should continue to use. She should call us back if for some reason she experiences weakness, falling, numbness or tingling in her lower extremities, loss of bowel or bladder function.

## 2019-08-02 NOTE — Progress Notes (Signed)
Kayla Chan  MRN: [id number] DOB: 1931-09-23  Subjective:  HPI   The patient is an 85 year old female who presents for evaluation of low back pain/tail bone pain after a fall.  She states she fell out of bed on Saturday (5 days ago).  She reports that she hit her head and tail bone.   She puts her tail bone pain on a pain scale at 9.  She has treated the pain with over the counter medication but does not get much relief.  She fell directly on her bottom. She also has pain in the right hip and has bruise to the right elbow.    The patient reports that her head does not hurt except when combing her hair.  She is uncomfortable in all positions except when sitting on the commode.  She used some Motrin last night but said she had itching around her ankle.  Patient Active Problem List   Diagnosis Date Noted  . Morbid obesity ([place]) 11/23/2018  . Chronic kidney disease, stage 3 (moderate) 11/09/2018  . Limited mobility 09/03/2017  . Fatigue 02/04/2016  . Palpitations 02/04/2016  . Gout 09/24/2015  . Hyperuricemia 08/20/2015  . Back pain 06/28/2015  . Constipation 06/28/2015  . Eczema 06/28/2015  . Edema 06/28/2015  . Pre-diabetes 06/28/2015  . Intertrigo 06/28/2015  . PAC (premature atrial contraction) 06/28/2015  . Premature heartbeats 06/28/2015  . History of basal cell cancer 02/18/2015  . Fatty infiltration of liver 05/31/2013  . Hypothyroidism 01/15/2011  . Arthropathy of pelvic region and thigh 08/01/2008  . HLD (hyperlipidemia) 03/13/2008  . Arthritis, degenerative 01/26/2007  . Hypertension 12/13/2006  . Personal history of venous thrombosis and embolism 05/24/2006    Past Medical History:  Diagnosis Date  . Cancer ([place])    BCC on nose  . Hypertension     Social History   Socioeconomic History  . Marital status: Married    Spouse name: Not on file  . Number of children: 0  . Years of education: Not on file  . Highest education level: Some college, no degree   Occupational History  . Occupation: Retired  Scientific laboratory technician  . Financial resource strain: Not hard at all  . Food insecurity    Worry: Never true    Inability: Never true  . Transportation needs    Medical: No    Non-medical: No  Tobacco Use  . Smoking status: Never Smoker  . Smokeless tobacco: Never Used  Substance and Sexual Activity  . Alcohol use: Yes    Alcohol/week: 0.0 standard drinks    Comment: 1 glass of wine occasionally  . Drug use: No  . Sexual activity: Not on file  Lifestyle  . Physical activity    Days per week: Not on file    Minutes per session: Not on file  . Stress: Not at all  Relationships  . Social Herbalist on phone: Not on file    Gets together: Not on file    Attends religious service: Not on file    Active member of club or organization: Not on file    Attends meetings of clubs or organizations: Not on file    Relationship status: Not on file  . Intimate partner violence    Fear of current or ex partner: Not on file    Emotionally abused: Not on file    Physically abused: Not on file    Forced sexual activity: Not on file  Other Topics Concern  . Not on file  Social History Narrative  . Not on file    Outpatient Encounter Medications as of 08/02/2019  Medication Sig Note  . aspirin 81 MG tablet Take 81 mg by mouth daily.   . chlorthalidone (HYGROTON) 25 MG tablet Take 0.5 tablets (12.5 mg total) by mouth daily.   Marland Kitchen ezetimibe (ZETIA) 10 MG tablet Take 1 tablet (10 mg total) by mouth every morning.   Marland Kitchen levothyroxine (SYNTHROID, LEVOTHROID) 75 MCG tablet Take 1 tablet (75 mcg total) by mouth daily.   Marland Kitchen lisinopril (PRINIVIL,ZESTRIL) 5 MG tablet TAKE 1 TABLET BY MOUTH EVERY DAY   . Aspirin-Calcium Carbonate 81-777 MG TABS Take by mouth.   . colchicine 0.6 MG tablet TAKE 1 TABLET (0.6 MG TOTAL) BY MOUTH 2 (TWO) TIMES DAILY AS NEEDED (GOUT).   . cyclobenzaprine (FLEXERIL) 5 MG tablet TAKE 1 TABLET BY MOUTH 3 (THREE) TIMES DAILY AS  NEEDED FOR UP TO 10 DAYS FOR MUSCLE SPASMS.   . hydrocortisone 2.5 % lotion Apply to face once daily as needed for itchiness   . ketoconazole (NIZORAL) 2 % shampoo Apply topically.   . mometasone (ELOCON) 0.1 % ointment APPLY 1 APPLICATION DAILY AS NEEDED TOPICALLY. APPLY TO AFFECTED AREA   . Multiple Vitamins-Minerals (MULTIVITAMIN ADULT PO) Take 1 tablet by mouth daily. 06/28/2015: Received from: [place]:   . pimecrolimus (ELIDEL) 1 % cream Apply topically 2 (two) times daily.   . verapamil (VERELAN PM) 120 MG 24 hr capsule Take by mouth.    No facility-administered encounter medications on file as of 08/02/2019.     Allergies  Allergen Reactions  . Amlodipine     dizziness  . Atorvastatin     Other reaction(s): Muscle Pain  . Colestid  [Colestipol Hcl]     Itching, insomnia  . Crestor  [Rosuvastatin Calcium]     Other reaction(s): Muscle Pain  . Doxycycline   . Fluvastatin Sodium     Other reaction(s): Muscle Pain  . Furosemide     Severe Cramping  . Pravastatin Sodium     Numbness in legs  . Verapamil     Shortness of breath, swelling, palpations.   . Allopurinol Other (See Comments)    dizziness  . Penicillins Rash    Took for pneumonia when she was 42 and mader her mouth break out    Review of Systems  Constitutional: Negative for chills, diaphoresis, fever and malaise/fatigue.  HENT: Negative for congestion, ear pain, sinus pain and sore throat.   Respiratory: Negative for cough and shortness of breath.   Cardiovascular: Negative for chest pain.  Gastrointestinal: Negative for abdominal pain and diarrhea.  Musculoskeletal: Positive for back pain, falls and joint pain. Negative for myalgias.  Skin: Negative.   Neurological: Negative for dizziness and headaches.  Endo/Heme/Allergies: Negative.   Psychiatric/Behavioral: Negative.     Objective:  BP (!) 150/88 (BP Location: Right Wrist, Patient Position: Sitting, Cuff Size: Normal)    Pulse 88   Temp (!) 97.1 F (36.2 C) (Skin)   SpO2 98%   Physical Exam  Constitutional: She is oriented to person, place, and time and well-developed, well-nourished, and in no distress.  Obese white female in no acute distress  HENT:  Head: Normocephalic.  She has 2 slight abrasions to the occipital region of her head.  Cardiovascular: Normal rate and regular rhythm.  Pulmonary/Chest: Effort normal and breath sounds normal.  Musculoskeletal:     Comments: Patient  does not appear to have tenderness over her coccyx but a little bit higher, either in the sacrum or the very lower LS-spine.  It is minimal tenderness.  Neurological: She is alert and oriented to person, place, and time.  Skin: Skin is warm and dry.  Psychiatric: Mood, memory, affect and judgment normal.  Vitals reviewed.   Assessment and Plan :  1. Acute midline low back pain without sciatica X-rays today.  Heat and a donut pillow.  This seems to give her relief when she is on the toilet like a donut pillow pillow should help.  Try tramadol for the pain. - DG Lumbar Spine Complete; Future - DG Sacrum/Coccyx; Future  2. Class 3 severe obesity due to excess calories with serious comorbidity and body mass index (BMI) of 45.0 to 49.9 in adult [facility]) Hypertension hyperlipidemia

## 2019-08-03 ENCOUNTER — Telehealth: Payer: Self-pay | Admitting: Family Medicine

## 2019-08-03 ENCOUNTER — Telehealth: Payer: Self-pay

## 2019-08-03 DIAGNOSIS — M545 Low back pain, unspecified: Secondary | ICD-10-CM

## 2019-08-03 MED ORDER — GABAPENTIN 300 MG PO CAPS: 300.0000 mg | ORAL_CAPSULE | Freq: Every day | ORAL | 0 refills | Status: DC

## 2019-08-03 NOTE — Telephone Encounter (Signed)
From [facility] 

## 2019-08-03 NOTE — Telephone Encounter (Signed)
Patient calling for results. NT unavailable.    Copied from [place] [id number]. Topic: General - Other >> Aug 03, 2019  8:24 AM Sabra Heck April M, [place] wrote: Reason for CRM: Okay for [facility] to give phone message concerning x-ray results.

## 2019-08-03 NOTE — Telephone Encounter (Signed)
Patient advised and agrees to try Gabapentin. Prescription sent into pharmacy.

## 2019-08-03 NOTE — Addendum Note (Signed)
Addended by: Randal Buba on: 08/03/2019 05:19 PM   Modules accepted: Orders

## 2019-08-03 NOTE — Telephone Encounter (Signed)
Copied from [place] [id number]. Topic: General - Call Back - No Documentation >> Aug 02, 2019  5:49 PM Erick Blinks wrote: Reason for CRM: Pt called back to discuss Xray results, no crm for [facility] to disclose. Please advise

## 2019-08-03 NOTE — Telephone Encounter (Signed)
Returned call to pt.  Advised of xray results from lumbar and sacrum, as was noted in TE from [place] on 12/9.  Several questions answered.  Stated she has taken 2 doses of Tramadol; one dose last night, and one dose today about 10:00 AM.  Reported she feels increase in dizziness after taking Tramadol.  Also reported it makes her groggy.  Advised that those are common side effects with Tramadol.  Stated she is uneasy continuing to take this medication, and asked if Dr. Caryn Section would recommend anything else for her pain.  States she usually just takes Tylenol.  Will send note to Dr. Caryn Section. Pt advised to report increase in weakness, numbness of LE's or loss of bowel or bladder function, or any other concerns.  Verb. Understanding.

## 2019-08-03 NOTE — Telephone Encounter (Addendum)
She can try gabapentin 300mg  qhs, #30, no refill. Should only take at night because it causes drowsiness. Can still taking tylenol during the day.

## 2019-08-04 ENCOUNTER — Telehealth: Payer: Self-pay

## 2019-08-04 NOTE — Telephone Encounter (Signed)
Copied from [place] [id number]. Topic: General - Call Back - No Documentation >> Aug 04, 2019  9:32 AM Erick Blinks wrote: Reason for CRM: Pt called and expressed confusion about results and also concerns about hitting her head during her last fall. Please advise, requesting nurse call back Best contact: [phone]  Also wants to know if her medication is making her light headed

## 2019-08-04 NOTE — Telephone Encounter (Signed)
Patient had called and was worried that her medication was making her Dizzy. Dr.Fisher changed her prescription today and she is going to take it at night before bed. She had fallen again since her last xray but it was not a forceful fall and claims to not be injured but did hit her head slightly.The patient was informed about her xray results and told to go to the ED if her Dizziness or confusion continued or worsened. She gave verbal understanding.

## 2019-08-07 NOTE — Telephone Encounter (Addendum)
She should come in this week for a follow up and check her BP. She can come in at 11:20 Tuesday or Wednesday.

## 2019-08-07 NOTE — Telephone Encounter (Signed)
Patient was advised and appointment has been scheduled for 08/08/19. KW

## 2019-08-07 NOTE — Telephone Encounter (Signed)
Patient complains that she is still having dizziness.  She gets it when she goes to get up.  She is unable to walk without assistance and is concerned bc her head was never x-rayed. Please advise

## 2019-08-07 NOTE — Telephone Encounter (Signed)
I

## 2019-08-07 NOTE — Telephone Encounter (Signed)
Patient was advised of results on 08/04/2019.

## 2019-08-08 ENCOUNTER — Ambulatory Visit (INDEPENDENT_AMBULATORY_CARE_PROVIDER_SITE_OTHER): Payer: PPO | Admitting: Family Medicine

## 2019-08-08 ENCOUNTER — Encounter: Payer: Self-pay | Admitting: Family Medicine

## 2019-08-08 ENCOUNTER — Other Ambulatory Visit: Payer: Self-pay

## 2019-08-08 VITALS — BP 132/82 | HR 96 | Temp 97.1°F | Resp 20 | Wt 235.0 lb

## 2019-08-08 DIAGNOSIS — S3210XD Unspecified fracture of sacrum, subsequent encounter for fracture with routine healing: Secondary | ICD-10-CM | POA: Diagnosis not present

## 2019-08-08 MED ORDER — PREDNISONE 20 MG PO TABS: 20.0000 mg | ORAL_TABLET | Freq: Two times a day (BID) | ORAL | 0 refills | Status: AC

## 2019-08-08 NOTE — Progress Notes (Signed)
Patient: Kayla Chan Female    DOB: 1932-06-08   85 y.o.   MRN: [id number] Visit Date: 08/08/2019  Today's Provider: Lelon Huh, MD   Chief Complaint  Patient presents with  . Dizziness   Subjective:     HPI  Follow up for Dizziness:  The patient was last seen for sacral fracture 6 days ago (seen by Dr. Rosanna Randy). Changes made at last visit include prescribing Tramadol. Patient called back to the office complaining of dizziness when she took Tramadol, and was changed to Gabapentin.   She reports fair compliance with treatment. She feels that condition is Worse.States that is very painful to sit. Is using a doughnut pillow. She is able to lie on her side comfortably and able to walk using walker.   She is not having side effects. She reports that the Gabapentin also caused dizziness, so she stopped taking it. Patient has only been taking Tylenol to help with pain. Patient still has dizziness. Patient describes dizziness and being off balanced.   ------------------------------------------------------------------------------------  Allergies  Allergen Reactions  . Amlodipine     dizziness  . Atorvastatin     Other reaction(s): Muscle Pain  . Colestid  [Colestipol Hcl]     Itching, insomnia  . Crestor  [Rosuvastatin Calcium]     Other reaction(s): Muscle Pain  . Doxycycline   . Fluvastatin Sodium     Other reaction(s): Muscle Pain  . Furosemide     Severe Cramping  . Pravastatin Sodium     Numbness in legs  . Verapamil     Shortness of breath, swelling, palpations.   . Allopurinol Other (See Comments)    dizziness  . Penicillins Rash    Took for pneumonia when she was 65 and mader her mouth break out     Current Outpatient Medications:  .  aspirin 81 MG tablet, Take 81 mg by mouth daily., Disp: , Rfl:  .  Aspirin-Calcium Carbonate 81-777 MG TABS, Take by mouth., Disp: , Rfl:  .  chlorthalidone (HYGROTON) 25 MG tablet, Take 0.5 tablets (12.5 mg total)  by mouth daily., Disp: 45 tablet, Rfl: 3 .  colchicine 0.6 MG tablet, TAKE 1 TABLET (0.6 MG TOTAL) BY MOUTH 2 (TWO) TIMES DAILY AS NEEDED (GOUT)., Disp: 30 tablet, Rfl: 5 .  cyclobenzaprine (FLEXERIL) 5 MG tablet, TAKE 1 TABLET BY MOUTH 3 (THREE) TIMES DAILY AS NEEDED FOR UP TO 10 DAYS FOR MUSCLE SPASMS., Disp: , Rfl: 0 .  ezetimibe (ZETIA) 10 MG tablet, Take 1 tablet (10 mg total) by mouth every morning., Disp: 90 tablet, Rfl: 4 .  hydrocortisone 2.5 % lotion, Apply to face once daily as needed for itchiness, Disp: , Rfl:  .  ketoconazole (NIZORAL) 2 % shampoo, Apply topically., Disp: , Rfl:  .  levothyroxine (SYNTHROID, LEVOTHROID) 75 MCG tablet, Take 1 tablet (75 mcg total) by mouth daily., Disp: 90 tablet, Rfl: 4 .  lisinopril (PRINIVIL,ZESTRIL) 5 MG tablet, TAKE 1 TABLET BY MOUTH EVERY DAY, Disp: 90 tablet, Rfl: 4 .  mometasone (ELOCON) 0.1 % ointment, APPLY 1 APPLICATION DAILY AS NEEDED TOPICALLY. APPLY TO AFFECTED AREA, Disp: 15 g, Rfl: 5 .  Multiple Vitamins-Minerals (MULTIVITAMIN ADULT PO), Take 1 tablet by mouth daily., Disp: , Rfl:  .  pimecrolimus (ELIDEL) 1 % cream, Apply topically 2 (two) times daily., Disp: 30 g, Rfl: 1 .  verapamil (VERELAN PM) 120 MG 24 hr capsule, Take by mouth., Disp: , Rfl:  .  gabapentin (  NEURONTIN) 300 MG capsule, Take 1 capsule (300 mg total) by mouth at bedtime. (Patient not taking: Reported on 08/08/2019), Disp: 30 capsule, Rfl: 0  Review of Systems  Constitutional: Negative for appetite change, chills, fatigue and fever.  Respiratory: Negative for chest tightness and shortness of breath.   Cardiovascular: Negative for chest pain and palpitations.  Gastrointestinal: Negative for abdominal pain, nausea and vomiting.  Neurological: Negative for dizziness and weakness.    Social History   Tobacco Use  . Smoking status: Never Smoker  . Smokeless tobacco: Never Used  Substance Use Topics  . Alcohol use: Yes    Alcohol/week: 0.0 standard drinks     Comment: 1 glass of wine occasionally      Objective:   BP 132/82 (BP Location: Right Wrist, Cuff Size: Normal)   Pulse 96   Temp (!) 97.1 F (36.2 C) (Temporal)   Resp 20   Wt 235 lb (106.6 kg)   SpO2 98% Comment: room air  BMI 47.46 kg/m  Vitals:   08/08/19 1131 08/08/19 1134  BP: (!) 142/90 132/82  Pulse: 96   Resp: 20   Temp: (!) 97.1 F (36.2 C)   TempSrc: Temporal   SpO2: 98%   Weight: 235 lb (106.6 kg)   Body mass index is 47.46 kg/m.   Physical Exam   General Appearance:    Obese female in no acute distress  Eyes:    PERRL, conjunctiva/corneas clear, EOM's intact       Lungs:     Clear to auscultation bilaterally, respirations unlabored  Heart:    Normal heart rate. Normal rhythm. No murmurs, rubs, or gallops.   MS:   All extremities are intact.   Neurologic:   Awake, alert, oriented x 3. No apparent focal neurological           defect.         Assessment & Plan    1. Closed fracture of sacrum with routine healing, unspecified portion of sacrum, subsequent encounter Intolerant to NSAIDs but having severe pain limiting ADLs, will start short course of prednisone - predniSONE (DELTASONE) 20 MG tablet; Take 1 tablet (20 mg total) by mouth 2 (two) times daily with a meal for 6 days.  Dispense: 12 tablet; Refill: 0  Dizziness resolved since stopping gabapentin Call if symptoms change or if not rapidly improving.    The entirety of the information documented in the History of Present Illness, Review of Systems and Physical Exam were personally obtained by me. Portions of this information were initially documented by Meyer Cory, CMA and reviewed by me for thoroughness and accuracy.      Lelon Huh, MD  [facility]

## 2019-08-30 ENCOUNTER — Other Ambulatory Visit: Payer: Self-pay | Admitting: Family Medicine

## 2019-08-30 DIAGNOSIS — M545 Low back pain, unspecified: Secondary | ICD-10-CM

## 2019-09-13 ENCOUNTER — Ambulatory Visit: Payer: Self-pay

## 2019-09-13 NOTE — Telephone Encounter (Signed)
Pt. And husband report she has had dizziness x 4-5 months - "seems to be getting worse." Mainly when she stands up. [place] [address] " a few weeks ago because she was dizzy." Denies any chest pain or shortness of breath.Only wants to see Dr. Caryn Section. Appointment made for Friday. Instructed to call back if symptoms worsen.  Reason for Disposition . [1] MODERATE dizziness (e.g., interferes with normal activities) AND [2] has NOT been evaluated by physician for this  (Exception: dizziness caused by heat exposure, sudden standing, or poor fluid intake)  Answer Assessment - Initial Assessment Questions 1. DESCRIPTION: "Describe your dizziness."     Dizzy 2. LIGHTHEADED: "Do you feel lightheaded?" (e.g., somewhat faint, woozy, weak upon standing)     Standing makes it worse 3. VERTIGO: "Do you feel like either you or the room is spinning or tilting?" (i.e. vertigo)     No 4. SEVERITY: "How bad is it?"  "Do you feel like you are going to faint?" "Can you stand and walk?"   - MILD - walking normally   - MODERATE - interferes with normal activities (e.g., work, school)    - SEVERE - unable to stand, requires support to walk, feels like passing out now.      Moderate 5. ONSET:  "When did the dizziness begin?"     4-5 months ago 6. AGGRAVATING FACTORS: "Does anything make it worse?" (e.g., standing, change in head position)     Standing 7. HEART RATE: "Can you tell me your heart rate?" "How many beats in 15 seconds?"  (Note: not all patients can do this)       No 8. CAUSE: "What do you think is causing the dizziness?"     Unsure 9. RECURRENT SYMPTOM: "Have you had dizziness before?" If so, ask: "When was the last time?" "What happened that time?"     No 10. OTHER SYMPTOMS: "Do you have any other symptoms?" (e.g., fever, chest pain, vomiting, diarrhea, bleeding)       No 11. PREGNANCY: "Is there any chance you are pregnant?" "When was your last menstrual period?"       No  Protocols used: DIZZINESS [facility]

## 2019-09-15 ENCOUNTER — Other Ambulatory Visit: Payer: Self-pay

## 2019-09-15 ENCOUNTER — Encounter: Payer: Self-pay | Admitting: Family Medicine

## 2019-09-15 ENCOUNTER — Ambulatory Visit (INDEPENDENT_AMBULATORY_CARE_PROVIDER_SITE_OTHER): Payer: PPO | Admitting: Family Medicine

## 2019-09-15 VITALS — BP 138/82 | HR 73 | Temp 96.9°F | Resp 18 | Wt 242.0 lb

## 2019-09-15 DIAGNOSIS — E039 Hypothyroidism, unspecified: Secondary | ICD-10-CM

## 2019-09-15 DIAGNOSIS — I10 Essential (primary) hypertension: Secondary | ICD-10-CM | POA: Diagnosis not present

## 2019-09-15 DIAGNOSIS — R7303 Prediabetes: Secondary | ICD-10-CM

## 2019-09-15 DIAGNOSIS — R42 Dizziness and giddiness: Secondary | ICD-10-CM | POA: Diagnosis not present

## 2019-09-15 DIAGNOSIS — E785 Hyperlipidemia, unspecified: Secondary | ICD-10-CM | POA: Diagnosis not present

## 2019-09-15 MED ORDER — LISINOPRIL 5 MG PO TABS: 5.0000 mg | ORAL_TABLET | Freq: Every evening | ORAL | Status: DC

## 2019-09-15 NOTE — Patient Instructions (Addendum)
.   Please review the attached list of medications and notify my office if there are any errors.   . Please bring all of your medications to every appointment so we can make sure that our medication list is the same as yours.    Start taking lisinopril in the evening instead of the morning to reduce side effects. Continue taking 1/2 tablet chlorthalidone in the morning

## 2019-09-15 NOTE — Progress Notes (Signed)
Patient: Kayla Chan Female    DOB: September 23, 1931   86 y.o.   MRN: [id number] Visit Date: 09/15/2019  Today's Provider: Lelon Huh, MD   Chief Complaint  Patient presents with  . Dizziness    x 4-5 months   Subjective:     Dizziness This is a new problem. The current episode started more than 1 month ago. The problem occurs intermittently. The problem has been unchanged. Associated symptoms include arthralgias (right knee pain) and joint swelling. Pertinent negatives include no abdominal pain, chest pain, chills, fatigue, fever, nausea, vomiting or weakness. Exacerbated by: quick head motion.  She reports feeling swimmy headed with quick movements of the head. Home blood pressure readings have averaged 126/70. Patient fell 5 weeks ago while reaching for her cane in bed. She states she hurt her right knee today while trying to push down the recliner. She also hit the back of her head when she fell a few weeks ago, but is not having any scalp pain or headaches. She states the dizziness only occurs when she is standing or walking, and usually when she turns her head quickly or looks up or down. It is a feeling of being off balance, but no spinning sensation.   Allergies  Allergen Reactions  . Amlodipine     dizziness  . Atorvastatin     Other reaction(s): Muscle Pain  . Colestid  [Colestipol Hcl]     Itching, insomnia  . Crestor  [Rosuvastatin Calcium]     Other reaction(s): Muscle Pain  . Doxycycline   . Fluvastatin Sodium     Other reaction(s): Muscle Pain  . Furosemide     Severe Cramping  . Pravastatin Sodium     Numbness in legs  . Verapamil     Shortness of breath, swelling, palpations.   . Allopurinol Other (See Comments)    dizziness  . Penicillins Rash    Took for pneumonia when she was 26 and mader her mouth break out     Current Outpatient Medications:  .  aspirin 81 MG tablet, Take 81 mg by mouth daily., Disp: , Rfl:  .  Aspirin-Calcium Carbonate  81-777 MG TABS, Take by mouth., Disp: , Rfl:  .  chlorthalidone (HYGROTON) 25 MG tablet, Take 0.5 tablets (12.5 mg total) by mouth daily., Disp: 45 tablet, Rfl: 3 .  colchicine 0.6 MG tablet, TAKE 1 TABLET (0.6 MG TOTAL) BY MOUTH 2 (TWO) TIMES DAILY AS NEEDED (GOUT)., Disp: 30 tablet, Rfl: 5 .  cyclobenzaprine (FLEXERIL) 5 MG tablet, TAKE 1 TABLET BY MOUTH 3 (THREE) TIMES DAILY AS NEEDED FOR UP TO 10 DAYS FOR MUSCLE SPASMS., Disp: , Rfl: 0 .  ezetimibe (ZETIA) 10 MG tablet, Take 1 tablet (10 mg total) by mouth every morning., Disp: 90 tablet, Rfl: 4 .  hydrocortisone 2.5 % lotion, Apply to face once daily as needed for itchiness, Disp: , Rfl:  .  ketoconazole (NIZORAL) 2 % shampoo, Apply topically., Disp: , Rfl:  .  levothyroxine (SYNTHROID, LEVOTHROID) 75 MCG tablet, Take 1 tablet (75 mcg total) by mouth daily., Disp: 90 tablet, Rfl: 4 .  lisinopril (PRINIVIL,ZESTRIL) 5 MG tablet, TAKE 1 TABLET BY MOUTH EVERY DAY, Disp: 90 tablet, Rfl: 4 .  mometasone (ELOCON) 0.1 % ointment, APPLY 1 APPLICATION DAILY AS NEEDED TOPICALLY. APPLY TO AFFECTED AREA, Disp: 15 g, Rfl: 5 .  Multiple Vitamins-Minerals (MULTIVITAMIN ADULT PO), Take 1 tablet by mouth daily., Disp: , Rfl:  .  pimecrolimus (  ELIDEL) 1 % cream, Apply topically 2 (two) times daily., Disp: 30 g, Rfl: 1 .  verapamil (VERELAN PM) 120 MG 24 hr capsule, Take by mouth., Disp: , Rfl:   Review of Systems  Constitutional: Negative.  Negative for appetite change, chills, fatigue and fever.  HENT: Negative.   Eyes: Negative.   Respiratory: Negative.  Negative for chest tightness and shortness of breath.   Cardiovascular: Negative.  Negative for chest pain and palpitations.  Gastrointestinal: Negative.  Negative for abdominal pain, nausea and vomiting.  Endocrine: Negative.   Genitourinary: Negative.   Musculoskeletal: Positive for arthralgias (right knee pain) and joint swelling.  Skin: Negative.   Allergic/Immunologic: Negative.   Neurological:  Positive for dizziness. Negative for weakness.  Hematological: Negative.   Psychiatric/Behavioral: Negative.     Social History   Tobacco Use  . Smoking status: Never Smoker  . Smokeless tobacco: Never Used  Substance Use Topics  . Alcohol use: Yes    Alcohol/week: 0.0 standard drinks    Comment: 1 glass of wine occasionally      Objective:   BP 138/82 (BP Location: Left Arm, Patient Position: Sitting, Cuff Size: Normal)   Pulse 73   Temp (!) 96.9 F (36.1 C) (Temporal)   Resp 18   Wt 242 lb (109.8 kg)   LMP  (Approximate)   SpO2 96% Comment: room air  BMI 48.88 kg/m  Vitals:   09/15/19 1534 09/15/19 1539  BP: (!) 159/81 138/82  Pulse: 73   Resp: 18   Temp: (!) 96.9 F (36.1 C)   TempSrc: Temporal   SpO2: 96%   Weight: 242 lb (109.8 kg)   Body mass index is 48.88 kg/m.   Physical Exam   General Appearance:    Obese female in no acute distress  Eyes:    PERRL, conjunctiva/corneas clear, EOM's intact       Lungs:     Clear to auscultation bilaterally, respirations unlabored  Heart:    Normal heart rate. Normal rhythm. No murmurs, rubs, or gallops.   MS:   All extremities are intact.   Neurologic:   Awake, alert, oriented x 3. No apparent focal neurological           defect.             Assessment & Plan    1. Dizziness Likely multifactorial. Not really consistent with vertigo as she does not experience a spinning sensation. May be related to labile blood pressure or from hitting the back of head with fall last month.  Recommended she split up her bp medications to take chlorthalidone in the am and the lisinopril in the p.m  - Comprehensive metabolic panel - CBC  2. Essential hypertension Not to goal, see how it does by taking - lisinopril (ZESTRIL) 5 MG tablet; Take 1 tablet (5 mg total) by mouth every evening.  Instead of the morning   3. Hypothyroidism, unspecified type Due to check - TSH  4. Pre-diabetes Due to check.  - Hemoglobin A1c   5.  Hyperlipidemia.  Check lipids.    The entirety of the information documented in the History of Present Illness, Review of Systems and Physical Exam were personally obtained by me. Portions of this information were initially documented by [facility], CMA and reviewed by me for thoroughness and accuracy.      Lelon Huh, MD  [facility]

## 2019-09-16 LAB — LIPID PANEL
Chol/HDL Ratio: 5 ratio — ABNORMAL HIGH (ref 0.0–4.4)
Cholesterol, Total: 267 mg/dL — ABNORMAL HIGH (ref 100–199)
HDL: 53 mg/dL (ref >39)
LDL Chol Calc (NIH): 165 mg/dL — ABNORMAL HIGH (ref 0–99)
Triglycerides: 260 mg/dL — ABNORMAL HIGH (ref 0–149)
VLDL Cholesterol Cal: 49 mg/dL — ABNORMAL HIGH (ref 5–40)

## 2019-09-16 LAB — CBC
Hematocrit: 42.7 % (ref 34.0–46.6)
Hemoglobin: 14.4 g/dL (ref 11.1–15.9)
MCH: 31.2 pg (ref 26.6–33.0)
MCHC: 33.7 g/dL (ref 31.5–35.7)
MCV: 92 fL (ref 79–97)
Platelets: 380 10*3/uL (ref 150–450)
RBC: 4.62 x10E6/uL (ref 3.77–5.28)
RDW: 13.1 % (ref 11.7–15.4)
WBC: 10.5 10*3/uL (ref 3.4–10.8)

## 2019-09-16 LAB — HEMOGLOBIN A1C
Est. average glucose Bld gHb Est-mCnc: 128 mg/dL
Hgb A1c MFr Bld: 6.1 % — ABNORMAL HIGH (ref 4.8–5.6)

## 2019-09-16 LAB — COMPREHENSIVE METABOLIC PANEL
ALT: 11 IU/L (ref 0–32)
AST: 18 IU/L (ref 0–40)
Albumin/Globulin Ratio: 1.4 (ref 1.2–2.2)
Albumin: 3.9 g/dL (ref 3.6–4.6)
Alkaline Phosphatase: 78 IU/L (ref 39–117)
BUN/Creatinine Ratio: 19 (ref 12–28)
BUN: 21 mg/dL (ref 8–27)
Bilirubin Total: 0.3 mg/dL (ref 0.0–1.2)
CO2: 23 mmol/L (ref 20–29)
Calcium: 9.8 mg/dL (ref 8.7–10.3)
Chloride: 103 mmol/L (ref 96–106)
Creatinine, Ser: 1.11 mg/dL — ABNORMAL HIGH (ref 0.57–1.00)
GFR calc Af Amer: 52 mL/min/{1.73_m2} — ABNORMAL LOW (ref >59)
GFR calc non Af Amer: 45 mL/min/{1.73_m2} — ABNORMAL LOW (ref >59)
Globulin, Total: 2.8 g/dL (ref 1.5–4.5)
Glucose: 98 mg/dL (ref 65–99)
Potassium: 4.2 mmol/L (ref 3.5–5.2)
Sodium: 142 mmol/L (ref 134–144)
Total Protein: 6.7 g/dL (ref 6.0–8.5)

## 2019-09-16 LAB — TSH: TSH: 3.59 u[IU]/mL (ref 0.450–4.500)

## 2019-09-18 ENCOUNTER — Telehealth: Payer: Self-pay

## 2019-09-18 DIAGNOSIS — Z96651 Presence of right artificial knee joint: Secondary | ICD-10-CM | POA: Diagnosis not present

## 2019-09-18 DIAGNOSIS — T8484XA Pain due to internal orthopedic prosthetic devices, implants and grafts, initial encounter: Secondary | ICD-10-CM | POA: Diagnosis not present

## 2019-09-18 NOTE — Telephone Encounter (Signed)
-----   Message from Birdie Sons, MD sent at 09/18/2019  2:17 PM EST ----- Cholesterol is up to 267, a1c is good at 6.1. rest of labs are normal. Be sure to remember to take ezetimibe for cholesterol every day. Follow up in march as scheduled.

## 2019-09-18 NOTE — Telephone Encounter (Signed)
Patient and husband was advised. Patient's husband states that he will make sure she is taking her Ezetimibe daily.

## 2019-09-25 NOTE — Progress Notes (Signed)
Subjective:   Kayla Chan is a 86 y.o. female who presents for Medicare Annual (Subsequent) preventive examination.    This visit is being conducted through telemedicine due to the COVID-19 pandemic. This patient has given me verbal consent via doximity to conduct this visit, patient states they are participating from their home address. Some vital signs may be absent or patient reported.    Patient identification: identified by name, DOB, and current address  Review of Systems:  N/A  Cardiac Risk Factors include: advanced age (>50men, >4 women);hypertension;obesity (BMI >30kg/m2);dyslipidemia     Objective:     Vitals: There were no vitals taken for this visit.  There is no height or weight on file to calculate BMI. Unable to obtain vitals due to visit being conducted via telephonically.   Advanced Directives 09/26/2019 09/21/2018 10/19/2017 09/03/2017 09/03/2017 08/19/2016  Does Patient Have a Medical Advance Directive? No Yes Yes No No No  Type of Advance Directive - [place];Living will [place];Living will - - -  Does patient want to make changes to medical advance directive? - - No - Patient declined - - No - Patient declined  Copy of [place] in Chart? - No - copy requested No - copy requested - - -  Would patient like information on creating a medical advance directive? - - - Yes (MAU/Ambulatory/Procedural Areas - Information given) No - Patient declined -    Tobacco Social History   Tobacco Use  Smoking Status Never Smoker  Smokeless Tobacco Never Used     Counseling given: Not Answered   Clinical Intake:  Pre-visit preparation completed: Yes  Pain : No/denies pain Pain Score: 0-No pain     Nutritional Risks: None Diabetes: No  How often do you need to have someone help you when you read instructions, pamphlets, or other written materials from your doctor or pharmacy?: 1 - Never  Interpreter  Needed?: No  Information entered by :: [facility], LPN  Past Medical History:  Diagnosis Date  . Cancer ([place])    BCC on nose  . Hypertension    Past Surgical History:  Procedure Laterality Date  . 24 hour Holter Monitor  01/2013   4550 ectopic beats with 344 superventricular bigemminy events  . Abdominal Ultrasound  05/31/2013   RUQ. Fatty Liver  . APPENDECTOMY  1950   [place]  . 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, [place]; Right   Family History  Problem Relation Age of Onset  . Stroke Mother        possible stroke  . Heart Problems Father   . Kidney cancer Sister   . Bladder Cancer Brother    Social History   Socioeconomic History  . Marital status: Married    Spouse name: Not on file  . Number of children: 0  . Years of education: Not on file  . Highest education level: Some college, no degree  Occupational History  . Occupation: Retired  Tobacco Use  . Smoking status: Never Smoker  . Smokeless tobacco: Never Used  Substance and Sexual Activity  . Alcohol use: Yes    Alcohol/week: 0.0 standard drinks    Comment: 1 glass of wine seldom  . Drug use: No  . Sexual activity: Not on file  Other Topics Concern  . Not on file  Social History Narrative  . Not on file   Social Determinants of Health   Financial Resource Strain: Low Risk   .  Difficulty of Paying Living Expenses: Not hard at all  Food Insecurity: No Food Insecurity  . Worried About Charity fundraiser in the Last Year: Never true  . Ran Out of Food in the Last Year: Never true  Transportation Needs: No Transportation Needs  . Lack of Transportation (Medical): No  . Lack of Transportation (Non-Medical): No  Physical Activity: Inactive  . Days of Exercise per Week: 0 days  . Minutes of Exercise per Session: 0 min  Stress: No Stress Concern Present  . Feeling of Stress : Not at all  Social Connections: Slightly Isolated  . Frequency of Communication with Friends  and Family: More than three times a week  . Frequency of Social Gatherings with Friends and Family: Once a week  . Attends Religious Services: More than 4 times per year  . Active Member of Clubs or Organizations: No  . Attends Archivist Meetings: Never  . Marital Status: Married    Outpatient Encounter Medications as of 09/26/2019  Medication Sig  . aspirin 81 MG tablet Take 81 mg by mouth daily.  . chlorthalidone (HYGROTON) 25 MG tablet Take 0.5 tablets (12.5 mg total) by mouth daily.  . colchicine 0.6 MG tablet TAKE 1 TABLET (0.6 MG TOTAL) BY MOUTH 2 (TWO) TIMES DAILY AS NEEDED (GOUT).  . cyclobenzaprine (FLEXERIL) 5 MG tablet TAKE 1 TABLET BY MOUTH 3 (THREE) TIMES DAILY AS NEEDED FOR UP TO 10 DAYS FOR MUSCLE SPASMS.  Marland Kitchen ezetimibe (ZETIA) 10 MG tablet Take 1 tablet (10 mg total) by mouth every morning.  . hydrocortisone 2.5 % lotion Apply to face once daily as needed for itchiness  . levothyroxine (SYNTHROID, LEVOTHROID) 75 MCG tablet Take 1 tablet (75 mcg total) by mouth daily.  Marland Kitchen lisinopril (ZESTRIL) 5 MG tablet Take 1 tablet (5 mg total) by mouth every evening.  . mometasone (ELOCON) 0.1 % ointment APPLY 1 APPLICATION DAILY AS NEEDED TOPICALLY. APPLY TO AFFECTED AREA  . Multiple Vitamins-Minerals (MULTIVITAMIN ADULT PO) Take 1 tablet by mouth daily.  . Aspirin-Calcium Carbonate 81-777 MG TABS Take by mouth.  Marland Kitchen ketoconazole (NIZORAL) 2 % shampoo Apply topically.  . pimecrolimus (ELIDEL) 1 % cream Apply topically 2 (two) times daily. (Patient not taking: Reported on 09/26/2019)   No facility-administered encounter medications on file as of 09/26/2019.    Activities of Daily Living In your present state of health, do you have any difficulty performing the following activities: 09/26/2019  Hearing? Y  Comment Wears bilateral hearing aids.  Vision? N  Difficulty concentrating or making decisions? N  Walking or climbing stairs? Y  Comment Due to knee pains.  Dressing or bathing?  Y  Comment Needs assistance with putting on socks.  Doing errands, shopping? N  Preparing Food and eating ? N  Using the Toilet? N  In the past six months, have you accidently leaked urine? N  Do you have problems with loss of bowel control? N  Managing your Medications? N  Managing your Finances? N  Housekeeping or managing your Housekeeping? N  Some recent data might be hidden    Patient Care Team: Birdie Sons, MD as PCP - General (Family Medicine) Regino Schultze, MD as Referring Physician (Dermatology) Birder Robson, MD as Referring Physician (Ophthalmology) Hessie Knows, MD as Consulting Physician (Orthopedic Surgery)    Assessment:   This is a routine wellness examination for Derna.  Exercise Activities and Dietary recommendations Current Exercise Habits: The patient does not participate in regular exercise  at present, Exercise limited by: orthopedic condition(s)  Goals    . Cut back on carbohydrates     Recommend cutting back on the bad carbohydrates and focusing on eating more healthy carbohydrates. Avoid eating large consumption's of bread and butter.     . Increase water intake     Starting 08/19/16, I will continue to drink 5 glasses of water a day.     . Reduce caffeine intake     Recommend to cut back on caffinated beverages and increase water consumption.        Fall Risk: Fall Risk  09/26/2019 10/21/2018 09/21/2018 09/03/2017 09/03/2017  Falls in the past year? 1 0 0 No No  Number falls in past yr: 1 - - - -  Injury with Fall? 1 - - - -  Risk for fall due to : Impaired balance/gait;Impaired mobility - - - -  Follow up Falls prevention discussed - - - -    FALL RISK PREVENTION PERTAINING TO THE HOME:  Any stairs in or around the home? Yes  If so, are there any without handrails? No   Home free of loose throw rugs in walkways, pet beds, electrical cords, etc? Yes  Adequate lighting in your home to reduce risk of falls? Yes   ASSISTIVE  DEVICES UTILIZED TO PREVENT FALLS:  Life alert? No  Use of a cane, walker or w/c? Yes  Grab bars in the bathroom? Yes  Shower chair or bench in shower? Yes  Elevated toilet seat or a handicapped toilet? Yes    TIMED UP AND GO:  Was the test performed? No .    Depression Screen PHQ 2/9 Scores 09/26/2019 10/21/2018 09/21/2018 09/21/2018  PHQ - 2 Score 0 0 0 0  PHQ- 9 Score - - 0 -     Cognitive Function: Declined today.      6CIT Screen 09/21/2018 08/19/2016  What Year? 0 points 0 points  What month? 0 points 0 points  What time? 0 points 0 points  Count back from 20 0 points 0 points  Months in reverse 4 points 0 points  Repeat phrase 6 points 4 points  Total Score 10 4    Immunization History  Administered Date(s) Administered  . Fluad Quad(high Dose 65+) 05/29/2019  . Influenza Split 06/11/2007, 06/09/2008, 05/30/2009  . Influenza, High Dose Seasonal PF 05/31/2015, 05/30/2016, 06/10/2017, 07/02/2018  . Influenza,inj,Quad PF,6+ Mos 06/20/2013, 05/09/2014  . Pneumococcal Conjugate-13 05/09/2014  . Pneumococcal Polysaccharide-23 05/24/2006    Qualifies for Shingles Vaccine? Yes . Due for Shingrix. Pt has been advised to call insurance company to determine out of pocket expense. Advised may also receive vaccine at local pharmacy or Health Dept. Verbalized acceptance and understanding.  Tdap: Although this vaccine is not a covered service during a Wellness Exam, does the patient still wish to receive this vaccine today?  No . Advised may receive this vaccine at local pharmacy or Health Dept. Aware to provide a copy of the vaccination record if obtained from local pharmacy or Health Dept. Verbalized acceptance and understanding.  Flu Vaccine: Up to date  Pneumococcal Vaccine: Completed series  Screening Tests Health Maintenance  Topic Date Due  . TETANUS/TDAP  09/24/2020 (Originally 05/07/1951)  . DEXA SCAN  08/19/2026 (Originally 05/08/1999)  . INFLUENZA VACCINE  Completed   . PNA vac Low Risk Adult  Completed    Cancer Screenings:  Colorectal Screening: No longer required.   Mammogram: No longer required.   Bone Density: Currently due.  Pt declined scheduling a future scan.   Lung Cancer Screening: (Low Dose CT Chest recommended if Age 52-80 years, 30 pack-year currently smoking OR have quit w/in 15years.) does not qualify.   Additional Screening:  Vision Screening: Recommended annual ophthalmology exams for early detection of glaucoma and other disorders of the eye.  Dental Screening: Recommended annual dental exams for proper oral hygiene  Community Resource Referral:  CRR required this visit?  No       Plan:  I have personally reviewed and addressed the Medicare Annual Wellness questionnaire and have noted the following in the patient's chart:  A. Medical and social history B. Use of alcohol, tobacco or illicit drugs  C. Current medications and supplements D. Functional ability and status E.  Nutritional status F.  Physical activity G. Advance directives H. List of other physicians I.  Hospitalizations, surgeries, and ER visits in previous 12 months J.  Joy such as hearing and vision if needed, cognitive and depression L. Referrals and appointments   In addition, I have reviewed and discussed with patient certain preventive protocols, quality metrics, and best practice recommendations. A written personalized care plan for preventive services as well as general preventive health recommendations were provided to patient. Nurse Health Advisor  Signed,    Shayne Deerman [place], [place]  [id number] Nurse Health Advisor   Nurse Notes: Pt declined scheduling a future DEXA scan.

## 2019-09-26 ENCOUNTER — Other Ambulatory Visit: Payer: Self-pay

## 2019-09-26 ENCOUNTER — Ambulatory Visit (INDEPENDENT_AMBULATORY_CARE_PROVIDER_SITE_OTHER): Payer: PPO

## 2019-09-26 DIAGNOSIS — Z0000 Encounter for general adult medical examination without abnormal findings: Secondary | ICD-10-CM

## 2019-09-26 NOTE — Patient Instructions (Signed)
Ms. Kayla Chan , Thank you for taking time to come for your Medicare Wellness Visit. I appreciate your ongoing commitment to your health goals. Please review the following plan we discussed and let me know if I can assist you in the future.   Screening recommendations/referrals: Colonoscopy: No longer required.  Mammogram: No longer required.  Bone Density: Currently due- pt declined order today. Recommended yearly ophthalmology/optometry visit for glaucoma screening and checkup Recommended yearly dental visit for hygiene and checkup  Vaccinations: Influenza vaccine: Up to date Pneumococcal vaccine: Completed series Tdap vaccine: Pt declines today.  Shingles vaccine: Pt declines today.     Advanced directives: Needing to set up an advanced directive. Husband to pick up forms from office to complete, get notarized and return a copy to the office.  Conditions/risks identified: Continue to increase water intake to 6-8 8 oz glasses a day.   Next appointment: 11/14/19 @ 1:40 PM with Dr Caryn Section   Preventive Care 64 Years and Older, Female Preventive care refers to lifestyle choices and visits with your health care provider that can promote health and wellness. What does preventive care include?  A yearly physical exam. This is also called an annual well check.  Dental exams once or twice a year.  Routine eye exams. Ask your health care provider how often you should have your eyes checked.  Personal lifestyle choices, including:  Daily care of your teeth and gums.  Regular physical activity.  Eating a healthy diet.  Avoiding tobacco and drug use.  Limiting alcohol use.  Practicing safe sex.  Taking low-dose aspirin every day.  Taking vitamin and mineral supplements as recommended by your health care provider. What happens during an annual well check? The services and screenings done by your health care provider during your annual well check will depend on your age, overall  health, lifestyle risk factors, and family history of disease. Counseling  Your health care provider may ask you questions about your:  Alcohol use.  Tobacco use.  Drug use.  Emotional well-being.  Home and relationship well-being.  Sexual activity.  Eating habits.  History of falls.  Memory and ability to understand (cognition).  Work and work Statistician.  Reproductive health. Screening  You may have the following tests or measurements:  Height, weight, and BMI.  Blood pressure.  Lipid and cholesterol levels. These may be checked every 5 years, or more frequently if you are over 66 years old.  Skin check.  Lung cancer screening. You may have this screening every year starting at age 15 if you have a 30-pack-year history of smoking and currently smoke or have quit within the past 15 years.  Fecal occult blood test (FOBT) of the stool. You may have this test every year starting at age 49.  Flexible sigmoidoscopy or colonoscopy. You may have a sigmoidoscopy every 5 years or a colonoscopy every 10 years starting at age 84.  Hepatitis C blood test.  Hepatitis B blood test.  Sexually transmitted disease (STD) testing.  Diabetes screening. This is done by checking your blood sugar (glucose) after you have not eaten for a while (fasting). You may have this done every 1-3 years.  Bone density scan. This is done to screen for osteoporosis. You may have this done starting at age 22.  Mammogram. This may be done every 1-2 years. Talk to your health care provider about how often you should have regular mammograms. Talk with your health care provider about your test results, treatment options, and  if necessary, the need for more tests. Vaccines  Your health care provider may recommend certain vaccines, such as:  Influenza vaccine. This is recommended every year.  Tetanus, diphtheria, and acellular pertussis (Tdap, Td) vaccine. You may need a Td booster every 10  years.  Zoster vaccine. You may need this after age 52.  Pneumococcal 13-valent conjugate (PCV13) vaccine. One dose is recommended after age 82.  Pneumococcal polysaccharide (PPSV23) vaccine. One dose is recommended after age 40. Talk to your health care provider about which screenings and vaccines you need and how often you need them. This information is not intended to replace advice given to you by your health care provider. Make sure you discuss any questions you have with your health care provider. Document Released: 09/06/2015 Document Revised: 04/29/2016 Document Reviewed: 06/11/2015 Elsevier Interactive Patient Education  2017 [place] Prevention in the Home Falls can cause injuries. They can happen to people of all ages. There are many things you can do to make your home safe and to help prevent falls. What can I do on the outside of my home?  Regularly fix the edges of walkways and driveways and fix any cracks.  Remove anything that might make you trip as you walk through a door, such as a raised step or threshold.  Trim any bushes or trees on the path to your home.  Use bright outdoor lighting.  Clear any walking paths of anything that might make someone trip, such as rocks or tools.  Regularly check to see if handrails are loose or broken. Make sure that both sides of any steps have handrails.  Any raised decks and porches should have guardrails on the edges.  Have any leaves, snow, or ice cleared regularly.  Use sand or salt on walking paths during winter.  Clean up any spills in your garage right away. This includes oil or grease spills. What can I do in the bathroom?  Use night lights.  Install grab bars by the toilet and in the tub and shower. Do not use towel bars as grab bars.  Use non-skid mats or decals in the tub or shower.  If you need to sit down in the shower, use a plastic, non-slip stool.  Keep the floor dry. Clean up any water that  spills on the floor as soon as it happens.  Remove soap buildup in the tub or shower regularly.  Attach bath mats securely with double-sided non-slip rug tape.  Do not have throw rugs and other things on the floor that can make you trip. What can I do in the bedroom?  Use night lights.  Make sure that you have a light by your bed that is easy to reach.  Do not use any sheets or blankets that are too big for your bed. They should not hang down onto the floor.  Have a firm chair that has side arms. You can use this for support while you get dressed.  Do not have throw rugs and other things on the floor that can make you trip. What can I do in the kitchen?  Clean up any spills right away.  Avoid walking on wet floors.  Keep items that you use a lot in easy-to-reach places.  If you need to reach something above you, use a strong step stool that has a grab bar.  Keep electrical cords out of the way.  Do not use floor polish or wax that makes floors slippery. If you  must use wax, use non-skid floor wax.  Do not have throw rugs and other things on the floor that can make you trip. What can I do with my stairs?  Do not leave any items on the stairs.  Make sure that there are handrails on both sides of the stairs and use them. Fix handrails that are broken or loose. Make sure that handrails are as long as the stairways.  Check any carpeting to make sure that it is firmly attached to the stairs. Fix any carpet that is loose or worn.  Avoid having throw rugs at the top or bottom of the stairs. If you do have throw rugs, attach them to the floor with carpet tape.  Make sure that you have a light switch at the top of the stairs and the bottom of the stairs. If you do not have them, ask someone to add them for you. What else can I do to help prevent falls?  Wear shoes that:  Do not have high heels.  Have rubber bottoms.  Are comfortable and fit you well.  Are closed at the  toe. Do not wear sandals.  If you use a stepladder:  Make sure that it is fully opened. Do not climb a closed stepladder.  Make sure that both sides of the stepladder are locked into place.  Ask someone to hold it for you, if possible.  Clearly mark and make sure that you can see:  Any grab bars or handrails.  First and last steps.  Where the edge of each step is.  Use tools that help you move around (mobility aids) if they are needed. These include:  Canes.  Walkers.  Scooters.  Crutches.  Turn on the lights when you go into a dark area. Replace any light bulbs as soon as they burn out.  Set up your furniture so you have a clear path. Avoid moving your furniture around.  If any of your floors are uneven, fix them.  If there are any pets around you, be aware of where they are.  Review your medicines with your doctor. Some medicines can make you feel dizzy. This can increase your chance of falling. Ask your doctor what other things that you can do to help prevent falls. This information is not intended to replace advice given to you by your health care provider. Make sure you discuss any questions you have with your health care provider. Document Released: 06/06/2009 Document Revised: 01/16/2016 Document Reviewed: 09/14/2014 Elsevier Interactive Patient Education  2017 [employer].

## 2019-11-03 ENCOUNTER — Other Ambulatory Visit: Payer: Self-pay | Admitting: Family Medicine

## 2019-11-03 DIAGNOSIS — I10 Essential (primary) hypertension: Secondary | ICD-10-CM

## 2019-11-14 ENCOUNTER — Other Ambulatory Visit: Payer: Self-pay

## 2019-11-14 ENCOUNTER — Encounter: Payer: Self-pay | Admitting: Family Medicine

## 2019-11-14 ENCOUNTER — Ambulatory Visit (INDEPENDENT_AMBULATORY_CARE_PROVIDER_SITE_OTHER): Payer: PPO | Admitting: Family Medicine

## 2019-11-14 VITALS — BP 117/75 | HR 94 | Temp 96.8°F | Wt 244.8 lb

## 2019-11-14 DIAGNOSIS — E785 Hyperlipidemia, unspecified: Secondary | ICD-10-CM

## 2019-11-14 DIAGNOSIS — I10 Essential (primary) hypertension: Secondary | ICD-10-CM | POA: Diagnosis not present

## 2019-11-14 DIAGNOSIS — E039 Hypothyroidism, unspecified: Secondary | ICD-10-CM | POA: Diagnosis not present

## 2019-11-14 DIAGNOSIS — R7303 Prediabetes: Secondary | ICD-10-CM | POA: Diagnosis not present

## 2019-11-14 DIAGNOSIS — Z6841 Body Mass Index (BMI) 40.0 and over, adult: Secondary | ICD-10-CM

## 2019-11-14 MED ORDER — VALSARTAN 40 MG PO TABS: 40.0000 mg | ORAL_TABLET | Freq: Every day | ORAL | 1 refills | Status: DC

## 2019-11-14 NOTE — Progress Notes (Signed)
Patient: Kayla Chan   DOB: 1932/03/25   86 y.o. Female  MRN: [id number] Visit Date: 11/14/2019  Today's Provider: Lelon Huh, MD  Subjective:    Chief Complaint  Patient presents with  . Hypertension  . Hyperlipidemia  . Hypothyroidism  . Pre-diabetes   HPI  Prediabetes, Follow-up:   Lab Results  Component Value Date   HGBA1C 6.1 (H) 09/15/2019   HGBA1C 6.0 (A) 05/16/2019   HGBA1C 6.1 (H) 11/08/2018   GLUCOSE 98 09/15/2019   GLUCOSE 90 11/08/2018   GLUCOSE 105 (H) 07/11/2018   Lab Results  Component Value Date   HGBA1C 6.1 (H) 09/15/2019   HGBA1C 6.0 (A) 05/16/2019   HGBA1C 6.1 (H) 11/08/2018     Last seen for for this 2 months ago.  Management since that visit includes no change. Current symptoms include none  Weight trend: stable Prior visit with dietician: no Current diet: well balanced Current exercise: none  Pertinent Labs:    Component Value Date/Time   CHOL 267 (H) 09/15/2019 1616   TRIG 260 (H) 09/15/2019 1616   CHOLHDL 5.0 (H) 09/15/2019 1616   CREATININE 1.11 (H) 09/15/2019 1616   CREATININE 1.10 08/05/2013 1238    Wt Readings from Last 3 Encounters:  11/14/19 244 lb 12.8 oz (111 kg)  09/15/19 242 lb (109.8 kg)  08/08/19 235 lb (106.6 kg)     Hypertension, follow-up:  BP Readings from Last 3 Encounters:  11/14/19 117/75  09/15/19 138/82  08/08/19 132/82    She was last seen for hypertension 2 months ago.  BP at that visit was 119/64 Management since that visit includes no change.She reports good compliance with treatment. She is not having side effects.  She is not exercising. She is not adherent to low salt diet.   Outside blood pressures are being checked at home. She is experiencing lower extremity edema and dizziness.  Patient denies chest pain, chest pressure/discomfort, irregular heart beat and palpitations.   Cardiovascular risk factors include advanced age (older than 82 for men, 76 for women),  dyslipidemia, hypertension and obesity (BMI >= 30 kg/m2).  Use of agents associated with hypertension: NSAIDS.   ------------------------------------------------------------------------    Lipid/Cholesterol, Follow-up:   Last seen for this 2 months ago.  Management since that visit includes no change.  Last Lipid Panel:    Component Value Date/Time   CHOL 267 (H) 09/15/2019 1616   TRIG 260 (H) 09/15/2019 1616   HDL 53 09/15/2019 1616   CHOLHDL 5.0 (H) 09/15/2019 1616   LDLCALC 165 (H) 09/15/2019 1616    She reports good compliance with treatment. She is not having side effects.   Wt Readings from Last 3 Encounters:  11/14/19 244 lb 12.8 oz (111 kg)  09/15/19 242 lb (109.8 kg)  08/08/19 235 lb (106.6 kg)    ------------------------------------------------------------------------  Hypothyroid, follow-up:  TSH  Date Value Ref Range Status  09/15/2019 3.590 0.450 - 4.500 uIU/mL Final  11/08/2018 2.190 0.450 - 4.500 uIU/mL Final  03/07/2018 1.870 0.450 - 4.500 uIU/mL Final   Wt Readings from Last 3 Encounters:  11/14/19 244 lb 12.8 oz (111 kg)  09/15/19 242 lb (109.8 kg)  08/08/19 235 lb (106.6 kg)    She was last seen for hypothyroid 2 months ago.  Management since that visit includes no change. She reports good compliance with treatment. She is not having side effects.  She is not exercising. She is experiencing none She denies change in energy level,  diarrhea, heat / cold intolerance, nervousness, palpitations and weight changes Weight trend: stable  ------------------------------------------------------------------------      Medications: Outpatient Medications Prior to Visit  Medication Sig Note  . aspirin 81 MG tablet Take 81 mg by mouth daily.   . Aspirin-Calcium Carbonate 81-777 MG TABS Take by mouth.   . colchicine 0.6 MG tablet TAKE 1 TABLET (0.6 MG TOTAL) BY MOUTH 2 (TWO) TIMES DAILY AS NEEDED (GOUT).   . cyclobenzaprine (FLEXERIL) 5 MG tablet  TAKE 1 TABLET BY MOUTH 3 (THREE) TIMES DAILY AS NEEDED FOR UP TO 10 DAYS FOR MUSCLE SPASMS.   Marland Kitchen ezetimibe (ZETIA) 10 MG tablet Take 1 tablet (10 mg total) by mouth every morning.   . hydrocortisone 2.5 % lotion Apply to face once daily as needed for itchiness   . ketoconazole (NIZORAL) 2 % shampoo Apply topically.   Marland Kitchen levothyroxine (SYNTHROID, LEVOTHROID) 75 MCG tablet Take 1 tablet (75 mcg total) by mouth daily.   . mometasone (ELOCON) 0.1 % ointment APPLY 1 APPLICATION DAILY AS NEEDED TOPICALLY. APPLY TO AFFECTED AREA   . Multiple Vitamins-Minerals (MULTIVITAMIN ADULT PO) Take 1 tablet by mouth daily.   . [DISCONTINUED] chlorthalidone (HYGROTON) 25 MG tablet Take 0.5 tablets (12.5 mg total) by mouth daily.   . [DISCONTINUED] lisinopril (ZESTRIL) 5 MG tablet TAKE 1 TABLET BY MOUTH EVERY DAY 11/14/2019: dizziness and itching  . pimecrolimus (ELIDEL) 1 % cream Apply topically 2 (two) times daily. (Patient not taking: Reported on 09/26/2019)    No facility-administered medications prior to visit.     Review of Systems  Constitutional: Negative.   Respiratory: Negative.   Cardiovascular: Positive for leg swelling.  Endocrine: Negative.   Musculoskeletal: Negative.   Neurological: Positive for dizziness and light-headedness.        Objective:   BP 117/75 (BP Location: Left Arm, Patient Position: Sitting, Cuff Size: Large)   Pulse 94   Temp (!) 96.8 F (36 C) (Temporal)   Wt 244 lb 12.8 oz (111 kg)   BMI 49.44 kg/m  Vitals:   11/14/19 1335  BP: 117/75  Pulse: 94  Temp: (!) 96.8 F (36 C)  TempSrc: Temporal  Weight: 244 lb 12.8 oz (111 kg)     Physical Exam    General: Appearance:    Severely obese female in no acute distress  Eyes:    PERRL, conjunctiva/corneas clear, EOM's intact       Lungs:     Clear to auscultation bilaterally, respirations unlabored  Heart:    Normal heart rate. Normal rhythm. No murmurs, rubs, or gallops.   MS:   All extremities are intact.     Neurologic:   Awake, alert, oriented x 3. No apparent focal neurological           defect.            Assessment & Plan:    1. Essential hypertension Well controlled, but not tolerating lisinopril change to  - valsartan (DIOVAN) 40 MG tablet; Take 1 tablet (40 mg total) by mouth daily.  Dispense: 30 tablet; Refill: 1  2. Hypothyroidism, unspecified type Euthyroid. Continue current medications.    3. Pre-diabetes Stable. Encouraged avoid sugars in diet.   4. Hyperlipidemia, unspecified hyperlipidemia type Intolerant to multiple statins, but   5. BMI 45.0-49.9, adult ([place]) Counseled regarding prudent diet and regular exercise.    Future Appointments  Date Time Provider [place]  02/20/2020  1:40 PM Birdie Sons, MD [facility]  10/02/2020  2:00  PM [facility]SE HEALTH ADVISOR [facility]      The entirety of the information documented in the History of Present Illness, Review of Systems and Physical Exam were personally obtained by me. Portions of this information were initially documented by Idelle Jo, CMA and reviewed by me for thoroughness and accuracy.   Lelon Huh, MD  [facility] [phone] (phone) [phone] (fax)  [place]

## 2019-11-14 NOTE — Patient Instructions (Addendum)
.   Please review the attached list of medications and notify my office if there are any errors.   . Please bring all of your medications to every appointment so we can make sure that our medication list is the same as yours.    Stop taking lisinopril and take valsartan in its place every evening

## 2019-11-16 ENCOUNTER — Telehealth: Payer: Self-pay | Admitting: Family Medicine

## 2019-11-16 ENCOUNTER — Other Ambulatory Visit: Payer: Self-pay | Admitting: Family Medicine

## 2019-11-16 DIAGNOSIS — I10 Essential (primary) hypertension: Secondary | ICD-10-CM

## 2019-11-16 MED ORDER — NEOMYCIN-POLYMYXIN-HC 1 % OT SOLN: OTIC | 0 refills | Status: AC

## 2019-11-16 NOTE — Telephone Encounter (Signed)
Prescription has been sent.

## 2019-11-16 NOTE — Telephone Encounter (Signed)
Dr. Caryn Section, was there supposed to be a prescription for ear infection sent to pharmacy? Patient was last seen in the office 11/14/2019 for HTN.

## 2019-11-16 NOTE — Telephone Encounter (Signed)
Box is full, unable to leave message

## 2019-11-16 NOTE — Telephone Encounter (Signed)
Pt stated she check with pharmacy and has not received medication for ear infection/ please advise

## 2019-11-20 NOTE — Telephone Encounter (Signed)
Tried calling patient. No answer. I called pharmacy and was advised that patient picked up prescription on Friday 11/17/2019.

## 2019-12-07 ENCOUNTER — Other Ambulatory Visit: Payer: Self-pay | Admitting: Family Medicine

## 2019-12-07 DIAGNOSIS — I10 Essential (primary) hypertension: Secondary | ICD-10-CM

## 2019-12-25 ENCOUNTER — Telehealth: Payer: Self-pay | Admitting: *Deleted

## 2019-12-25 ENCOUNTER — Ambulatory Visit: Payer: Self-pay | Admitting: *Deleted

## 2019-12-25 NOTE — Telephone Encounter (Addendum)
Pt's husband called in for his wife, she was there with him, because she is very hard of hearing. He wanted to make an appt for her to be seen by Dr. Caryn Section.   (This call was transferred to me without an introduction from the agent so I was not aware what the call was for at first until told by the pt he was transferred to me).  I asked him what his wife needed to be seen for.   He then put her on the phone but he was close by in case he needed to help her due to her hearing impairment.  She is c/o being very dizzy and woozy since starting the new BP medication about a week ago.   Valsartan 40 mg.  BP is 122/54, pulse 78. She is requesting a call back in regards to this medication and what to do.   I let her know I would send a high priority note to Dr. Caryn Section making him aware of her dizziness and someone would call her back. She was agreeable to this.  "I just want to talk to him about this medicine".  The protocol was to be seen by PCP within 24 hours however she requested to be called first.  Did not recommend she go to the ED for the dizziness since it started with the Valsartan.  I sent my notes to Dr. Maralyn Sago office and called and spoke with Jiles Garter the flow coordinator making her aware I had sent the notes over.    Reason for Disposition . Taking a medicine that could cause dizziness (e.g., blood pressure medications, diuretics)    Started Valsartan  Answer Assessment - Initial Assessment Questions 1. DESCRIPTION: "Describe your dizziness."     Started on Valsartan 40 mg a week ago and every since then has been very dizzy and woozy. 2. LIGHTHEADED: "Do you feel lightheaded?" (e.g., somewhat faint, woozy, weak upon standing)     Woozy.   It's hard to do anything because I'm so dizzy. 3. VERTIGO: "Do you feel like either you or the room is spinning or tilting?" (i.e. vertigo)     No 4. SEVERITY: "How bad is it?"  "Do you feel like you are going to faint?" "Can you stand and walk?"   -  MILD - walking normally   - MODERATE - interferes with normal activities (e.g., work, school)    - SEVERE - unable to stand, requires support to walk, feels like passing out now.      I have to hold on to something when I stand up. 5. ONSET:  "When did the dizziness begin?"     About a week ago when I started this new BP medication the Valsartan. 6. AGGRAVATING FACTORS: "Does anything make it worse?" (e.g., standing, change in head position)     It's hard for me to do anything due to the dizziness. 7. HEART RATE: "Can you tell me your heart rate?" "How many beats in 15 seconds?"  (Note: not all patients can do this)       78 on BP machine.  BP 122/54.  She is not sure what she normally runs for a BP reading. 8. CAUSE: "What do you think is causing the dizziness?"     The new BP pill Dr. Caryn Section put me on. 9. RECURRENT SYMPTOM: "Have you had dizziness before?" If so, ask: "When was the last time?" "What happened that time?"     No 10. OTHER SYMPTOMS: "Do  you have any other symptoms?" (e.g., fever, chest pain, vomiting, diarrhea, bleeding)       No 11. PREGNANCY: "Is there any chance you are pregnant?" "When was your last menstrual period?"       *No Answer*  Protocols used: DIZZINESS Heidi Dach

## 2019-12-25 NOTE — Telephone Encounter (Signed)
Disregard.   Chart opened twice by mistake.

## 2019-12-26 ENCOUNTER — Other Ambulatory Visit: Payer: Self-pay

## 2019-12-26 ENCOUNTER — Ambulatory Visit: Payer: PPO | Admitting: Podiatry

## 2019-12-26 ENCOUNTER — Ambulatory Visit (INDEPENDENT_AMBULATORY_CARE_PROVIDER_SITE_OTHER): Payer: PPO

## 2019-12-26 DIAGNOSIS — M659 Synovitis and tenosynovitis, unspecified: Secondary | ICD-10-CM

## 2019-12-26 DIAGNOSIS — M76822 Posterior tibial tendinitis, left leg: Secondary | ICD-10-CM | POA: Diagnosis not present

## 2019-12-26 MED ORDER — MELOXICAM 15 MG PO TABS: 15.0000 mg | ORAL_TABLET | Freq: Every day | ORAL | 1 refills | Status: DC

## 2020-01-01 NOTE — Progress Notes (Signed)
    HPI: 86 y.o. female presenting today for follow up evaluation of bilateral ankle pain. She reports throbbing pain with associated swelling and stiffness. Walking increases the pain. She reports relief after receiving injections in the past and would like another one. Patient is here for further evaluation and treatment.   Past Medical History:  Diagnosis Date  . Cancer ([place])    BCC on nose  . Hypertension        Physical Exam: General: The patient is alert and oriented x3 in no acute distress.  Dermatology: Skin is warm, dry and supple bilateral lower extremities. Negative for open lesions or macerations.  Vascular: Palpable pedal pulses bilaterally. No edema or erythema noted. Capillary refill within normal limits.  Neurological: Epicritic and protective threshold grossly intact bilaterally.   Musculoskeletal Exam: Pain on palpation noted to the posterior tibial tendon of the left foot. Pain with palpation noted to the anterior, lateral and medial aspects of the right ankle joint. Range of motion within normal limits. Muscle strength 5/5 in all muscle groups bilateral lower extremities.  Radiographic Exam:  Normal osseous mineralization. Joint spaces preserved. No fracture or dislocation identified.    Assessment: 1. Posterior tibial tendinitis left 2. DJD / ankle synovitis right  3. H/o gout    Plan of Care:  1. Patient was evaluated. Radiographs were reviewed today. 2. Injection of 0.5 mL Celestone Soluspan injected into the posterior tibial tendon sheath of the left lower extremity.  3. Injection of 0.5 mLs Celestone Soluspan injected into the right ankle joint.  4. Patient did not take Meloxicam. Prescription for Meloxicam provided to patient. 5. Return to clinic as needed.   From [place].    Edrick Kins, DPM [facility]  Dr. Edrick Kins, [address]                                        [place], [place] [address]                 Office [phone]  Fax [phone]

## 2020-01-04 ENCOUNTER — Telehealth: Payer: Self-pay | Admitting: Family Medicine

## 2020-01-04 NOTE — Telephone Encounter (Signed)
Pt called and never received a call back/ Pt states she is having a reaction (dizziness/ drunk feeling) when taking valsartan (DIOVAN) 40 MG tablet And chlorthalidone (HYGROTON) 25 MG tablet  Pt would like a call back from Dr. Caryn Section or nurse to discuss options

## 2020-01-05 ENCOUNTER — Emergency Department: Payer: PPO

## 2020-01-05 ENCOUNTER — Emergency Department
Admission: EM | Admit: 2020-01-05 | Discharge: 2020-01-05 | Disposition: A | Discharge status: Home / Self Care | Payer: PPO | Attending: Student in an Organized Health Care Education/Training Program | Admitting: Student in an Organized Health Care Education/Training Program

## 2020-01-05 ENCOUNTER — Ambulatory Visit: Payer: Self-pay | Admitting: *Deleted

## 2020-01-05 ENCOUNTER — Other Ambulatory Visit: Payer: Self-pay

## 2020-01-05 DIAGNOSIS — Z7982 Long term (current) use of aspirin: Secondary | ICD-10-CM | POA: Insufficient documentation

## 2020-01-05 DIAGNOSIS — E039 Hypothyroidism, unspecified: Secondary | ICD-10-CM | POA: Diagnosis not present

## 2020-01-05 DIAGNOSIS — R42 Dizziness and giddiness: Secondary | ICD-10-CM | POA: Diagnosis not present

## 2020-01-05 DIAGNOSIS — Z79899 Other long term (current) drug therapy: Secondary | ICD-10-CM | POA: Diagnosis not present

## 2020-01-05 DIAGNOSIS — I10 Essential (primary) hypertension: Secondary | ICD-10-CM | POA: Insufficient documentation

## 2020-01-05 DIAGNOSIS — J9811 Atelectasis: Secondary | ICD-10-CM | POA: Diagnosis not present

## 2020-01-05 DIAGNOSIS — Z85828 Personal history of other malignant neoplasm of skin: Secondary | ICD-10-CM | POA: Diagnosis not present

## 2020-01-05 LAB — CBC
HCT: 45.3 % (ref 36.0–46.0)
Hemoglobin: 14.9 g/dL (ref 12.0–15.0)
MCH: 30.5 pg (ref 26.0–34.0)
MCHC: 32.9 g/dL (ref 30.0–36.0)
MCV: 92.6 fL (ref 80.0–100.0)
Platelets: 312 10*3/uL (ref 150–400)
RBC: 4.89 MIL/uL (ref 3.87–5.11)
RDW: 14.8 % (ref 11.5–15.5)
WBC: 9.8 10*3/uL (ref 4.0–10.5)
nRBC: 0 % (ref 0.0–0.2)

## 2020-01-05 LAB — URINALYSIS, COMPLETE (UACMP) WITH MICROSCOPIC
Bacteria, UA: NONE SEEN
Bilirubin Urine: NEGATIVE
Glucose, UA: NEGATIVE mg/dL
Hgb urine dipstick: NEGATIVE
Ketones, ur: NEGATIVE mg/dL
Leukocytes,Ua: NEGATIVE
Nitrite: NEGATIVE
Protein, ur: NEGATIVE mg/dL
Specific Gravity, Urine: 1.01 (ref 1.005–1.030)
pH: 5 (ref 5.0–8.0)

## 2020-01-05 LAB — BASIC METABOLIC PANEL
Anion gap: 9 (ref 5–15)
BUN: 31 mg/dL — ABNORMAL HIGH (ref 8–23)
CO2: 25 mmol/L (ref 22–32)
Calcium: 9.3 mg/dL (ref 8.9–10.3)
Chloride: 106 mmol/L (ref 98–111)
Creatinine, Ser: 1.1 mg/dL — ABNORMAL HIGH (ref 0.44–1.00)
GFR calc Af Amer: 53 mL/min — ABNORMAL LOW (ref >60)
GFR calc non Af Amer: 46 mL/min — ABNORMAL LOW (ref >60)
Glucose, Bld: 172 mg/dL — ABNORMAL HIGH (ref 70–99)
Potassium: 3.7 mmol/L (ref 3.5–5.1)
Sodium: 140 mmol/L (ref 135–145)

## 2020-01-05 MED ORDER — CHLORTHALIDONE 25 MG PO TABS: 25.0000 mg | ORAL_TABLET | Freq: Every day | ORAL | Status: DC

## 2020-01-05 MED ORDER — CHLORTHALIDONE 25 MG PO TABS: 12.5000 mg | ORAL_TABLET | Freq: Two times a day (BID) | ORAL | 1 refills | Status: DC

## 2020-01-05 MED ORDER — CHLORTHALIDONE 25 MG PO TABS
12.5000 mg | ORAL_TABLET | Freq: Every day | ORAL | Status: DC
  Administered 2020-01-05: 12.5 mg via ORAL
  Filled 2020-01-05: qty 0.5

## 2020-01-05 NOTE — ED Notes (Signed)
Repeat VS obtained by this RN. Pt visualized in NAD at this time. This RN apologized and explained delay to patient at this time.

## 2020-01-05 NOTE — ED Provider Notes (Signed)
[facility] Emergency Department Provider Note    First MD Initiated Contact with Patient 01/05/20 1429     (approximate)  I have reviewed the triage vital signs and the nursing notes.   HISTORY  Chief Complaint Hypertension and Dizziness    HPI Kayla Chan is a 85 y.o. femalethe below listed past medical history presents the ER for evaluation of concerns over elevated blood pressure as well as intermittent dizziness for 1 month.  States she feels dizzy whenever she is up walking around.  Describes it as a lightheaded feeling.  Has had issues with blood pressure medication in the past.  Denies any headaches but did have recent fall with no LOC but did strike the back of her head on the bedside table.  Denies any numbness or tingling at this time.  No chest pain or shortness of breath.    Past Medical History:  Diagnosis Date  . Cancer ([place])    BCC on nose  . Hypertension    Family History  Problem Relation Age of Onset  . Stroke Mother        possible stroke  . Heart Problems Father   . Kidney cancer Sister   . Bladder Cancer Brother    Past Surgical History:  Procedure Laterality Date  . 24 hour Holter Monitor  01/2013   4550 ectopic beats with 344 superventricular bigemminy events  . Abdominal Ultrasound  05/31/2013   RUQ. Fatty Liver  . APPENDECTOMY  1950   [place]  . 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 [employer], [place]; Right   Patient Active Problem List   Diagnosis Date Noted  . Morbid obesity ([place]) 11/23/2018  . Chronic kidney disease, stage 3 (moderate) 11/09/2018  . Limited mobility 09/03/2017  . Fatigue 02/04/2016  . Palpitations 02/04/2016  . Gout 09/24/2015  . Hyperuricemia 08/20/2015  . Back pain 06/28/2015  . Constipation 06/28/2015  . Eczema 06/28/2015  . Edema 06/28/2015  . Pre-diabetes 06/28/2015  . Intertrigo 06/28/2015  . PAC (premature atrial contraction) 06/28/2015  . Premature  heartbeats 06/28/2015  . History of basal cell cancer 02/18/2015  . Fatty infiltration of liver 05/31/2013  . Hypothyroidism 01/15/2011  . Arthropathy of pelvic region and thigh 08/01/2008  . HLD (hyperlipidemia) 03/13/2008  . Arthritis, degenerative 01/26/2007  . Hypertension 12/13/2006  . Personal history of venous thrombosis and embolism 05/24/2006      Prior to Admission medications   Medication Sig Start Date End Date Taking? Authorizing Provider  aspirin 81 MG tablet Take 81 mg by mouth daily.    [provider]  Aspirin-Calcium Carbonate 81-777 MG TABS Take by mouth.    [provider]  chlorthalidone (HYGROTON) 25 MG tablet Take 0.5 tablets (12.5 mg total) by mouth in the morning and at bedtime. 01/05/20   Merlyn Lot, MD  colchicine 0.6 MG tablet TAKE 1 TABLET (0.6 MG TOTAL) BY MOUTH 2 (TWO) TIMES DAILY AS NEEDED (GOUT). 04/11/18   Birdie Sons, MD  cyclobenzaprine (FLEXERIL) 5 MG tablet TAKE 1 TABLET BY MOUTH 3 (THREE) TIMES DAILY AS NEEDED FOR UP TO 10 DAYS FOR MUSCLE SPASMS. 05/26/18   [provider]  ezetimibe (ZETIA) 10 MG tablet Take 1 tablet (10 mg total) by mouth every morning. 11/08/18   Birdie Sons, MD  hydrocortisone 2.5 % lotion Apply to face once daily as needed for itchiness 05/02/18   [provider]  ketoconazole (NIZORAL) 2 % shampoo Apply topically. 05/02/18  [provider]  levothyroxine (SYNTHROID, LEVOTHROID) 75 MCG tablet Take 1 tablet (75 mcg total) by mouth daily. 11/08/18   Birdie Sons, MD  meloxicam (MOBIC) 15 MG tablet Take 1 tablet (15 mg total) by mouth daily. 12/26/19   Edrick Kins, DPM  mometasone (ELOCON) 0.1 % ointment APPLY 1 APPLICATION DAILY AS NEEDED TOPICALLY. APPLY TO AFFECTED AREA 09/30/18   Birdie Sons, MD  Multiple Vitamins-Minerals (MULTIVITAMIN ADULT PO) Take 1 tablet by mouth daily. 10/11/08   [provider]  pimecrolimus (ELIDEL) 1 % cream Apply topically 2 (two)  times daily. Patient not taking: Reported on 09/26/2019 05/31/19   Birdie Sons, MD  valsartan (DIOVAN) 40 MG tablet TAKE 1 TABLET BY MOUTH EVERY DAY 12/07/19   Birdie Sons, MD    Allergies Amlodipine, Atorvastatin, Colestid  [colestipol hcl], Crestor  [rosuvastatin calcium], Doxycycline, Fluvastatin sodium, Furosemide, Pravastatin sodium, Verapamil, Allopurinol, and Penicillins    Social History Social History   Tobacco Use  . Smoking status: Never Smoker  . Smokeless tobacco: Never Used  Substance Use Topics  . Alcohol use: Yes    Alcohol/week: 0.0 standard drinks    Comment: 1 glass of wine seldom  . Drug use: No    Review of Systems Patient denies headaches, rhinorrhea, blurry vision, numbness, shortness of breath, chest pain, edema, cough, abdominal pain, nausea, vomiting, diarrhea, dysuria, fevers, rashes or hallucinations unless otherwise stated above in HPI. ____________________________________________   PHYSICAL EXAM:  VITAL SIGNS: Vitals:   01/05/20 1616 01/05/20 1628  BP: (!) 178/84   Pulse: 68   Resp:  16  Temp:    SpO2: 98%     Constitutional: Alert and oriented.  Eyes: Conjunctivae are normal.  Head: Atraumatic. Nose: No congestion/rhinnorhea. Mouth/Throat: Mucous membranes are moist.   Neck: No stridor. Painless ROM.  Cardiovascular: Normal rate, regular rhythm. Grossly normal heart sounds.  Good peripheral circulation. Respiratory: Normal respiratory effort.  No retractions. Lungs CTAB. Gastrointestinal: Soft and nontender. No distention. No abdominal bruits. No CVA tenderness. Genitourinary:  Musculoskeletal: No lower extremity tenderness nor edema.  No joint effusions. Neurologic:  CN- intact.  No facial droop, Normal FNF.  Normal heel to shin.  Sensation intact bilaterally. Normal speech and language. No gross focal neurologic deficits are appreciated. No gait instability. Skin:  Skin is warm, dry and intact. No rash noted. Psychiatric:  Mood and affect are normal. Speech and behavior are normal.  ____________________________________________   LABS (all labs ordered are listed, but only abnormal results are displayed)  Results for orders placed or performed during the hospital encounter of 01/05/20 (from the past 24 hour(s))  Urinalysis, Complete w Microscopic     Status: Abnormal   Collection Time: 01/05/20 11:49 AM  Result Value Ref Range   Color, Urine STRAW (A) YELLOW   APPearance CLEAR (A) CLEAR   Specific Gravity, Urine 1.010 1.005 - 1.030   pH 5.0 5.0 - 8.0   Glucose, UA NEGATIVE NEGATIVE mg/dL   Hgb urine dipstick NEGATIVE NEGATIVE   Bilirubin Urine NEGATIVE NEGATIVE   Ketones, ur NEGATIVE NEGATIVE mg/dL   Protein, ur NEGATIVE NEGATIVE mg/dL   Nitrite NEGATIVE NEGATIVE   Leukocytes,Ua NEGATIVE NEGATIVE   RBC / HPF 0-5 0 - 5 RBC/hpf   WBC, UA 0-5 0 - 5 WBC/hpf   Bacteria, UA NONE SEEN NONE SEEN   Squamous Epithelial / LPF 0-5 0 - 5  Basic metabolic panel     Status: Abnormal   Collection Time:  01/05/20 11:50 AM  Result Value Ref Range   Sodium 140 135 - 145 mmol/L   Potassium 3.7 3.5 - 5.1 mmol/L   Chloride 106 98 - 111 mmol/L   CO2 25 22 - 32 mmol/L   Glucose, Bld 172 (H) 70 - 99 mg/dL   BUN 31 (H) 8 - 23 mg/dL   Creatinine, Ser 1.10 (H) 0.44 - 1.00 mg/dL   Calcium 9.3 8.9 - 10.3 mg/dL   GFR calc non Af Amer 46 (L) >60 mL/min   GFR calc Af Amer 53 (L) >60 mL/min   Anion gap 9 5 - 15  CBC     Status: None   Collection Time: 01/05/20 11:50 AM  Result Value Ref Range   WBC 9.8 4.0 - 10.5 K/uL   RBC 4.89 3.87 - 5.11 MIL/uL   Hemoglobin 14.9 12.0 - 15.0 g/dL   HCT 45.3 36.0 - 46.0 %   MCV 92.6 80.0 - 100.0 fL   MCH 30.5 26.0 - 34.0 pg   MCHC 32.9 30.0 - 36.0 g/dL   RDW 14.8 11.5 - 15.5 %   Platelets 312 150 - 400 K/uL   nRBC 0.0 0.0 - 0.2 %   ____________________________________________  EKG My review and personal interpretation at Time: 11:50   Indication: htn  Rate: 80  Rhythm: sinus  Axis: normal Other: nonspecific st abn, occasional pvc ____________________________________________  RADIOLOGY  I personally reviewed all radiographic images ordered to evaluate for the above acute complaints and reviewed radiology reports and findings.  These findings were personally discussed with the patient.  Please see medical record for radiology report.  ____________________________________________   PROCEDURES  Procedure(s) performed:  Procedures    Critical Care performed: no ____________________________________________   INITIAL IMPRESSION / ASSESSMENT AND PLAN / ED COURSE  Pertinent labs & imaging results that were available during my care of the patient were reviewed by me and considered in my medical decision making (see chart for details).   DDX: htn, acs, chf, cva, medication reaction  Melvis Pedley is a 86 y.o. who presents to the ED with symptoms as described above.  Patient is nontoxic-appearing blood pressure is elevated.  Buttock is roughly at baseline.  Will redose her home medication.  Symptoms of dizziness have been ongoing for quite some time.  She denying any chest pain shortness of breath or pressure.  EKG is nonischemic and she denies any chest pain or pressure.  Will order imaging.  does not seem consistent with CVA but given recent fall will order neuro imaging.  No neck pain.  The patient will be placed on continuous pulse oximetry and telemetry for monitoring.  Laboratory evaluation will be sent to evaluate for the above complaints.  She is not showing any signs or symptoms of malignant hypertension at this time.  And states that she did skip one of her blood pressure medications.  Will redose.   Clinical Course as of Jan 05 1639  Fri Jan 05, 2020  1614 Patient reassessed.  She feels well.  Blood pressure improving after redosing her home antihypertensive medication.  She not demonstrating any signs or symptoms of CVA and the symptoms have been ongoing  for over a month.  At this point I do believe she stable and appropriate for outpatient follow-up.   [PR]    Clinical Course User Index [PR] Merlyn Lot, MD    The patient was evaluated in Emergency Department today for the symptoms described in the history of present illness.  He/she was evaluated in the context of the global COVID-19 pandemic, which necessitated consideration that the patient might be at risk for infection with the SARS-CoV-2 virus that causes COVID-19. Institutional protocols and algorithms that pertain to the evaluation of patients at risk for COVID-19 are in a state of rapid change based on information released by regulatory bodies including the CDC and federal and state organizations. These policies and algorithms were followed during the patient's care in the ED.  As part of my medical decision making, I reviewed the following data within the [facility] notes reviewed and incorporated, Labs reviewed, notes from prior ED visits and [place] Controlled Substance Database   ____________________________________________   FINAL CLINICAL IMPRESSION(S) / ED DIAGNOSES  Final diagnoses:  Hypertension, unspecified type      NEW MEDICATIONS STARTED DURING THIS VISIT:  Discharge Medication List as of 01/05/2020  4:17 PM       Note:  This document was prepared using Dragon voice recognition software and may include unintentional dictation errors.    Merlyn Lot, MD 01/05/20 1640

## 2020-01-05 NOTE — ED Triage Notes (Signed)
Reports dizziness X 1 month and intermittent hypertension. Pt recently had her medications changed. Pt alert and oriented X4, cooperative, RR even and unlabored, color WNL. Pt in NAD.

## 2020-01-05 NOTE — ED Triage Notes (Signed)
First RN Note: Pt presents to ED via POV after being referred by PCP for HTN, pt's husband reports BP 185 at home, pt's also c/o dizziness x 1 month.

## 2020-01-05 NOTE — Telephone Encounter (Signed)
Pt called in c/o being dizzy when she stands up and walks around.   She is not dizzy when she is sitting or lying down.   Her BP is 185/107, pulse 76.    She has not taken her BP medication this morning.    She denies blurry vision but had a headache yesterday but not today.  She was started on a new medication Valsartan.  When call initially taken it was for a medication question.   That was the protocol I started with.   However after talking with her and finding out what her symptoms were I switched to the protocol for hypertension or high BP.     She was started on Valsartan 40 mg by Dr. Caryn Section on 11/14/2019.  She also takes Hygroton 25 mg  1/2 tablet daily.   Husband on phone with her.  She has difficulty hearing.   He mentioned they called before but never got a call back about this.  "She has been dizzy for a month".    I called into the office and spoke with Elmyra Ricks.   All the CMAs were in rooms with pts.   I called into the office first because she has been dizzy since starting the valsartan.   I then went by the protocol and referred her to the ED. Husband was on the phone with her and I instructed them to go to the ED because her BP is very elevated and she is so dizzy.   They were agreeable.   Husband asked,  "How does that work going to the ED?"   I let him know to tell the person at the check in desk when they first arrive what is going on with her BP and the dizziness. I let him know Dr. Caryn Section could see in her chart what they do for her at the hospital. He is going to drive her to [facility] now.  I sent this message to Dr. Maralyn Sago office so he would be aware of the ED referral.          Reason for Disposition . [1] Caller has URGENT medication question about med that PCP or specialist prescribed AND [2] triager unable to answer question    Went to high  BP protocol. . [id number] Systolic BP  >= 0000000 OR Diastolic >= [id number] AND [id number] cardiac or neurologic symptoms  (e.g., chest pain, difficulty breathing, unsteady gait, blurred vision)  Answer Assessment - Initial Assessment Questions 1.   NAME of MEDICATION: "What medicine are you calling about?"     I'm so dizzy after starting the medicine and they changed it and now I'm more dizzy.   Taking Valsartan and Hygroton.   She took a half of each and is still dizzy.   She thinks it's the Vlsartan.  She's only dizzy when she stands up. When sitting and lying she is not dizzy. 117-140/56-64. 2.   QUESTION: "What is your question?"     What do I need to do? 3.   PRESCRIBING HCP: "Who prescribed it?" Reason: if prescribed by specialist, call should be referred to that group.     Dr. Caryn Section 4. SYMPTOMS: "Do you have any symptoms?"     I'm so dizzy 5. SEVERITY: If symptoms are present, ask "Are they mild, moderate or severe?"     I can't hardly walk around. 6.  PREGNANCY:  "Is there any chance that you are pregnant?" "When was your last menstrual period?"  N/A due to age  Protocols used: MEDICATION QUESTION CALL-A-AH, HIGH BLOOD PRESSURE-A-AH

## 2020-01-08 DIAGNOSIS — Z85828 Personal history of other malignant neoplasm of skin: Secondary | ICD-10-CM | POA: Diagnosis not present

## 2020-01-08 DIAGNOSIS — L821 Other seborrheic keratosis: Secondary | ICD-10-CM | POA: Diagnosis not present

## 2020-01-08 DIAGNOSIS — L218 Other seborrheic dermatitis: Secondary | ICD-10-CM | POA: Diagnosis not present

## 2020-01-08 DIAGNOSIS — L820 Inflamed seborrheic keratosis: Secondary | ICD-10-CM | POA: Diagnosis not present

## 2020-01-10 ENCOUNTER — Ambulatory Visit (INDEPENDENT_AMBULATORY_CARE_PROVIDER_SITE_OTHER): Payer: PPO | Admitting: Adult Health

## 2020-01-10 ENCOUNTER — Encounter: Payer: Self-pay | Admitting: Adult Health

## 2020-01-10 ENCOUNTER — Other Ambulatory Visit: Payer: Self-pay

## 2020-01-10 VITALS — BP 118/84 | HR 78 | Temp 96.6°F | Resp 16 | Wt 241.4 lb

## 2020-01-10 DIAGNOSIS — I10 Essential (primary) hypertension: Secondary | ICD-10-CM

## 2020-01-10 DIAGNOSIS — R42 Dizziness and giddiness: Secondary | ICD-10-CM

## 2020-01-10 DIAGNOSIS — R944 Abnormal results of kidney function studies: Secondary | ICD-10-CM | POA: Diagnosis not present

## 2020-01-10 DIAGNOSIS — Z9189 Other specified personal risk factors, not elsewhere classified: Secondary | ICD-10-CM

## 2020-01-10 MED ORDER — VALSARTAN 40 MG PO TABS: 20.0000 mg | ORAL_TABLET | Freq: Every day | ORAL | 0 refills | Status: DC

## 2020-01-10 MED ORDER — CHLORTHALIDONE 25 MG PO TABS: 12.5000 mg | ORAL_TABLET | Freq: Every day | ORAL | 1 refills | Status: DC

## 2020-01-10 NOTE — Patient Instructions (Signed)

## 2020-01-10 NOTE — Progress Notes (Signed)
Established patient visit   Patient: Kayla Chan   DOB: 06/18/1932   86 y.o. Female  MRN: [id number] Visit Date: 01/10/2020  Today's healthcare provider: Marcille Buffy, FNP   Chief Complaint  Patient presents with   Follow-up   Subjective    HPI Follow up ER visit  Patient was seen in ER for complaints of elevated blood pressure and dizziness on 01/05/2020. She was treated for hypertension. Treatment for this included adjusting medication. Chlorthalidone Take 0.5 tablets (12.5 mg total) morning and evening  by mouth in the morning and at bedtime. She reports she did this for two days and her dizziness got worse.  She reports poor compliance with treatment. She reports this condition is Unchanged.  She went back to taking her lisinopril 5 mg  and PCP had previously took her off of it due to itching. She has skin that is itching very much.  She reports she has been taking  She stopped her Valsartan she thinks. She is very confused as to what medications she has really been taking, she has two chlorthalidone bottles with her and reported she was taking both.    She had mild headache and she said resolved this was the day she was in the ED and it resolved.  She  reports she is getting readings diastolic around 50 / systolic [id number]  And she feels very dizzy at that time and dizziness with positional changes.  She denies any  Change is her dizziness from when she saw Dr Caryn Section last visit 5 months ago for dizziness.  CT , EKG and labs reviewed. EKG with BBB and PVC's/  GFR 46 - renal unction stable for one year.   She denies any head trauma or falls since her most recent ER visit.  Patient is accompanied by her spouse today and he stats that Chlorthalidone causes symptoms of dizziness to be worse, he stated that patient did not start prescription prescribed by ER doctor and he denies letting ER doctor know that medication was causing dizziness. Patient states that she  started a old prescription that was previously discontinued by her PCP which was Lisinopril 5mg  daily. Patient reports that Dr. Caryn Section had discontinued medication because patient reported itching in her lower extremities.   Patient  denies any fever, body aches,chills, rash, chest pain, shortness of breath, nausea, vomiting, or diarrhea.  She denies any syncopal episodes.   Uses cane with ambulation.  -----------------------------------------------------------------------------------------   Patient Active Problem List   Diagnosis Date Noted   Morbid obesity ([place]) 11/23/2018   Chronic kidney disease, stage 3 (moderate) 11/09/2018   Limited mobility 09/03/2017   Fatigue 02/04/2016   Palpitations 02/04/2016   Gout 09/24/2015   Hyperuricemia 08/20/2015   Back pain 06/28/2015   Constipation 06/28/2015   Eczema 06/28/2015   Edema 06/28/2015   Pre-diabetes 06/28/2015   Intertrigo 06/28/2015   PAC (premature atrial contraction) 06/28/2015   Premature heartbeats 06/28/2015   History of basal cell cancer 02/18/2015   Fatty infiltration of liver 05/31/2013   Hypothyroidism 01/15/2011   Arthropathy of pelvic region and thigh 08/01/2008   HLD (hyperlipidemia) 03/13/2008   Arthritis, degenerative 01/26/2007   Hypertension 12/13/2006   Personal history of venous thrombosis and embolism 05/24/2006   Past Medical History:  Diagnosis Date   Cancer ([place])    BCC on nose   Hypertension    Allergies  Allergen Reactions   Amlodipine     dizziness   Atorvastatin  Other reaction(s): Muscle Pain   Colestid  [Colestipol Hcl]     Itching, insomnia   Crestor  [Rosuvastatin Calcium]     Other reaction(s): Muscle Pain   Doxycycline    Fluvastatin Sodium     Other reaction(s): Muscle Pain   Furosemide     Severe Cramping   Pravastatin Sodium     Numbness in legs   Verapamil     Shortness of breath, swelling, palpations.    Allopurinol Other (See  Comments)    dizziness   Penicillins Rash    Took for pneumonia when she was 23 and mader her mouth break out       Medications: Outpatient Medications Prior to Visit  Medication Sig   tacrolimus (PROTOPIC) 0.1 % ointment Apply to itching on face twice daily   aspirin 81 MG tablet Take 81 mg by mouth daily.   Aspirin-Calcium Carbonate 81-777 MG TABS Take by mouth.   chlorthalidone (HYGROTON) 25 MG tablet Take 0.5 tablets (12.5 mg total) by mouth in the morning and at bedtime.   colchicine 0.6 MG tablet TAKE 1 TABLET (0.6 MG TOTAL) BY MOUTH 2 (TWO) TIMES DAILY AS NEEDED (GOUT).   cyclobenzaprine (FLEXERIL) 5 MG tablet TAKE 1 TABLET BY MOUTH 3 (THREE) TIMES DAILY AS NEEDED FOR UP TO 10 DAYS FOR MUSCLE SPASMS.   ezetimibe (ZETIA) 10 MG tablet Take 1 tablet (10 mg total) by mouth every morning.   hydrocortisone 2.5 % lotion Apply to face once daily as needed for itchiness   ketoconazole (NIZORAL) 2 % shampoo Apply topically.   levothyroxine (SYNTHROID, LEVOTHROID) 75 MCG tablet Take 1 tablet (75 mcg total) by mouth daily.   meloxicam (MOBIC) 15 MG tablet Take 1 tablet (15 mg total) by mouth daily.   mometasone (ELOCON) 0.1 % ointment APPLY 1 APPLICATION DAILY AS NEEDED TOPICALLY. APPLY TO AFFECTED AREA   Multiple Vitamins-Minerals (MULTIVITAMIN ADULT PO) Take 1 tablet by mouth daily.   pimecrolimus (ELIDEL) 1 % cream Apply topically 2 (two) times daily. (Patient not taking: Reported on 09/26/2019)   valsartan (DIOVAN) 40 MG tablet TAKE 1 TABLET BY MOUTH EVERY DAY   No facility-administered medications prior to visit.    Review of Systems  Constitutional: Negative.   HENT: Negative.   Respiratory: Negative.   Cardiovascular: Negative.   Gastrointestinal: Negative.   Genitourinary: Negative.   Musculoskeletal: Negative.   Neurological: Positive for dizziness and light-headedness. Negative for tremors, seizures, syncope, facial asymmetry, speech difficulty, weakness,  numbness and headaches.    Last metabolic panel Lab Results  Component Value Date   GLUCOSE 172 (H) 01/05/2020   NA 140 01/05/2020   K 3.7 01/05/2020   CL 106 01/05/2020   CO2 25 01/05/2020   BUN 31 (H) 01/05/2020   CREATININE 1.10 (H) 01/05/2020   GFRNONAA 46 (L) 01/05/2020   GFRAA 53 (L) 01/05/2020   CALCIUM 9.3 01/05/2020   PHOS 3.8 11/08/2018   PROT 6.7 09/15/2019   ALBUMIN 3.9 09/15/2019   LABGLOB 2.8 09/15/2019   AGRATIO 1.4 09/15/2019   BILITOT 0.3 09/15/2019   ALKPHOS 78 09/15/2019   AST 18 09/15/2019   ALT 11 09/15/2019   ANIONGAP 9 01/05/2020   Last lipids Lab Results  Component Value Date   CHOL 267 (H) 09/15/2019   HDL 53 09/15/2019   LDLCALC 165 (H) 09/15/2019   TRIG 260 (H) 09/15/2019   CHOLHDL 5.0 (H) 09/15/2019      Objective    BP 118/84  Pulse 78    Temp (!) 96.6 F (35.9 C) (Oral)    Resp 16    Wt 241 lb 6.4 oz (109.5 kg)    SpO2 97%    BMI 47.15 kg/m  BP Readings from Last 3 Encounters:  01/10/20 118/84  01/05/20 (!) 178/84  11/14/19 117/75      Physical Exam Vitals reviewed.  Constitutional:      General: She is not in acute distress.    Appearance: Normal appearance. She is obese. She is not ill-appearing, toxic-appearing or diaphoretic.     Comments: Patient is alert and oriented and responsive to questions Engages in eye contact with provider. Speaks in full sentences without any pauses without any shortness of breath or distress.    HENT:     Right Ear: Tympanic membrane, ear canal and external ear normal. There is no impacted cerumen.     Left Ear: Tympanic membrane, ear canal and external ear normal. There is no impacted cerumen.     Nose: Nose normal. No congestion or rhinorrhea.     Mouth/Throat:     Mouth: Mucous membranes are moist.     Pharynx: No oropharyngeal exudate or posterior oropharyngeal erythema.  Eyes:     General: No scleral icterus.       Right eye: No discharge.        Left eye: No discharge.    Cardiovascular:     Rate and Rhythm: Normal rate and regular rhythm.     Pulses: Normal pulses.     Heart sounds: Normal heart sounds. No murmur. No friction rub. No gallop.      Comments: No dyspnea  Pulmonary:     Effort: Pulmonary effort is normal. No respiratory distress.     Breath sounds: Normal breath sounds. No stridor. No wheezing, rhonchi or rales.  Chest:     Chest wall: No tenderness.  Abdominal:     General: There is no distension.     Palpations: Abdomen is soft.     Tenderness: There is no abdominal tenderness. There is no guarding.  Musculoskeletal:     Cervical back: Normal range of motion and neck supple.     Right lower leg: 1+ Edema present.     Left lower leg: 1+ Edema present.  Skin:    Capillary Refill: Capillary refill takes less than 2 seconds.  Neurological:     Mental Status: She is alert and oriented to person, place, and time. Mental status is at baseline.     GCS: GCS eye subscore is 4. GCS verbal subscore is 5. GCS motor subscore is 6.     Sensory: Sensation is intact.     Motor: Motor function is intact. No weakness.     Coordination: Coordination is intact. Coordination normal. Rapid alternating movements normal.     Gait: Gait normal.     Deep Tendon Reflexes: Reflexes normal.     Comments: Uses cane.   Psychiatric:        Mood and Affect: Mood normal.        Behavior: Behavior normal.        Thought Content: Thought content normal.     Unable to perform orthostatics as  patient did not want to lay down.   No results found for any visits on 01/10/20.  Assessment & Plan    1. Essential hypertension She will go back to the chlorthalidone 12.5 mg in the morning that Dr. Caryn Section previously had her on.She reported  that after the emergency room increased her to twice daily of the chlorithalidione that her dizziness in the afternoons got much worse.   She reports she can not tolerate the dose in the morning and at night. She has also not been  taking her Valsartan as she has not been taking it for over two weeks she reports because she thought that was making her dizzy. She can do Valsartan 40 mg 1/2 tablet in the morning and monitor blood pressure morning and evening and anytime dizzy. If over 140/90 call the office. May need second dose of Valsartan in the evening 40mg  1/2 tablet.  Patient questions if she should take her cholesterol medication she is advised she should take all her medications as prescribed including this one.  She also has taken 5mg  of lisinopril  the last two days and has had intense itching. She is advised to stop it. She is advised not to take the lisinopril as it is causing her to itch and she had previously been taken off.   From reported blood pressures in office question hypotension causing increased dizziness  In the evenings, she is to keep log morning and evening and bring to next visit.   advised slow position changes, and hydration with clear fluids. Husband reports she does not drink a lot during the day.   Meds ordered this encounter  Medications   valsartan (DIOVAN) 40 MG tablet    Sig: Take 0.5 tablets (20 mg total) by mouth daily. In the morning    Dispense:  30 tablet    Refill:  0   chlorthalidone (HYGROTON) 25 MG tablet    Sig: Take 0.5 tablets (12.5 mg total) by mouth daily. In the morning    Dispense:  45 tablet    Refill:  1    Medications Discontinued During This Encounter  Medication Reason   lisinopril (ZESTRIL) 5 MG tablet Discontinued by provider   valsartan (DIOVAN) 40 MG tablet Completed Course   chlorthalidone (HYGROTON) 25 MG tablet Reorder   2. Dizzinesses Likely multifactorial, CT head did show mild atrophic changes no acute process, follow up with Dr. Caryn Section at primary care visit 6/29 to discuss.  Emergent RED flag symptoms and when to return or seek care immediately were discussed.   3. At risk for polypharmacy She said multiple things to the [facility] and to the  provider when reviewing medication list, she is advised to bring all medications to next visit so they can be reviewed.  Question rather patient had been taking Chlorthalidone possibly  extra as she had two bottles with her today.   Husband is with her and frustrated he states " she does not know what she is taking " . He and she both verbalize instruction as above. This was previously discontinued by her PCP as well for this same reason.    Return in about 2 weeks (around 01/24/2020), or if symptoms worsen or fail to improve, for at any time for any worsening symptoms, Go to Emergency room/ urgent care if worse.  Advised patient call the office or your primary care doctor for an appointment if no improvement within 72 hours or if any symptoms change or worsen at any time  Advised ER or urgent Care if after hours or on weekend. Call 911 for emergency symptoms at any time.Patinet verbalized understanding of all instructions given/reviewed and treatment plan and has no further questions or concerns at this time.      Addressed extensive list  of chronic and acute medical problems today requiring 36 minutes reviewing her medical record, counseling patient regarding her conditions and coordination of care.        [place] Sahid Borba, FNP, have reviewed all documentation for this visit. The documentation on 01/10/20 for the exam, diagnosis, procedures, and orders are all accurate and complete.   Marcille Buffy, [place] [phone] (phone) [phone] (fax)  [place]

## 2020-01-24 ENCOUNTER — Other Ambulatory Visit: Payer: Self-pay | Admitting: Family Medicine

## 2020-01-24 DIAGNOSIS — I10 Essential (primary) hypertension: Secondary | ICD-10-CM

## 2020-01-24 NOTE — Telephone Encounter (Signed)
Please advise on refill request. Patient is not due for refill until July according to directions. Patient has follow up appointment scheduled with you tomorrow.

## 2020-01-24 NOTE — Telephone Encounter (Signed)
Pharmacy is requesting refills on Valsartan 40 mg.

## 2020-01-25 ENCOUNTER — Other Ambulatory Visit: Payer: Self-pay

## 2020-01-25 ENCOUNTER — Ambulatory Visit (INDEPENDENT_AMBULATORY_CARE_PROVIDER_SITE_OTHER): Payer: PPO | Admitting: Adult Health

## 2020-01-25 ENCOUNTER — Encounter: Payer: Self-pay | Admitting: Adult Health

## 2020-01-25 VITALS — BP 152/80 | HR 79 | Temp 96.6°F | Resp 16 | Wt 242.8 lb

## 2020-01-25 DIAGNOSIS — H6123 Impacted cerumen, bilateral: Secondary | ICD-10-CM

## 2020-01-25 DIAGNOSIS — H6501 Acute serous otitis media, right ear: Secondary | ICD-10-CM

## 2020-01-25 DIAGNOSIS — H60393 Other infective otitis externa, bilateral: Secondary | ICD-10-CM | POA: Diagnosis not present

## 2020-01-25 DIAGNOSIS — Z974 Presence of external hearing-aid: Secondary | ICD-10-CM | POA: Diagnosis not present

## 2020-01-25 DIAGNOSIS — I10 Essential (primary) hypertension: Secondary | ICD-10-CM | POA: Diagnosis not present

## 2020-01-25 DIAGNOSIS — H6090 Unspecified otitis externa, unspecified ear: Secondary | ICD-10-CM | POA: Insufficient documentation

## 2020-01-25 MED ORDER — CHLORTHALIDONE 25 MG PO TABS: 12.5000 mg | ORAL_TABLET | Freq: Two times a day (BID) | ORAL | 1 refills | Status: DC

## 2020-01-25 MED ORDER — CHLORTHALIDONE 25 MG PO TABS: ORAL_TABLET | ORAL | 1 refills | Status: DC

## 2020-01-25 MED ORDER — NEOMYCIN-POLYMYXIN-HC 1 % OT SOLN: 3.0000 [drp] | Freq: Four times a day (QID) | OTIC | 1 refills | Status: DC

## 2020-01-25 MED ORDER — AZITHROMYCIN 250 MG PO TABS: ORAL_TABLET | ORAL | 0 refills | Status: DC

## 2020-01-25 NOTE — Progress Notes (Signed)
Established patient visit   Patient: Kayla Chan   DOB: [birth year]   86 y.o. Female  MRN: [id number] Visit Date: 01/25/2020  Today's healthcare provider: Marcille Buffy, FNP   Chief Complaint  Patient presents with  . Hypertension   Subjective    HPI  Hypertension, follow-up  BP Readings from Last 3 Encounters:  01/25/20 (!) 152/80  01/10/20 118/84  01/05/20 (!) 178/84   Wt Readings from Last 3 Encounters:  01/25/20 242 lb 12.8 oz (110.1 kg)  01/10/20 241 lb 6.4 oz (109.5 kg)  01/05/20 240 lb (108.9 kg)     She was last seen for hypertension 2 weeks ago.  BP at that visit was 118/84. Management since that visit includes restarting patient on Valsartan 40mg  at 1/2 ( 20mg  ) on the morning tablet daily and starting patient back on Chlorthalidone 12.5mg  once daily versus twice daily due to dizziness and her blood pressure was much lower than previous.   She has been taking clorithialidone 12.5 mg in the morning.  She has not been taking norvasc at all since her ED visit prior to her last visit with this provider.  She reports she has not taken her Valsartan at all since last visit. She brings in blood pressure reading list that ranges from average systolic [id number] to 0000000 and diastolic ranging from  [id number] to 95/ heart rate has been averaging 68-86 per patinets records.   She reports she feels her dizziness is better She reports fair compliance with treatment.Patient reports she did not start Valsartan She is having side effects. Patient reports dizziness but that it has improved since her last visit.  She reports less positional changes causing dizziness. Denies any sudden head movements causing worsening symptoms. Denies headache.  She is following a Regular diet. She is not exercising. She does not smoke.  Use of agents associated with hypertension: NSAIDS.    Symptoms: No chest pain No chest pressure  No palpitations No syncope  No dyspnea No orthopnea  Yes  paroxysmal nocturnal dyspnea Yes lower extremity edema   Patient  denies any fever, body aches,chills, rash, chest pain, shortness of breath, nausea, vomiting, or diarrhea.  Denies pre syncopal or syncopal episodes. Denies any falls since last visit.   Pertinent labs: Lab Results  Component Value Date   CHOL 267 (H) 09/15/2019   HDL 53 09/15/2019   LDLCALC 165 (H) 09/15/2019   TRIG 260 (H) 09/15/2019   CHOLHDL 5.0 (H) 09/15/2019   Lab Results  Component Value Date   NA 140 01/05/2020   K 3.7 01/05/2020   CREATININE 1.10 (H) 01/05/2020   GFRNONAA 46 (L) 01/05/2020   GFRAA 53 (L) 01/05/2020   GLUCOSE 172 (H) 01/05/2020     The ASCVD Risk score Mikey Bussing DC Jr., et al., 2013) failed to calculate for the following reasons:   The 2013 ASCVD risk score is only valid for ages 33 to 85   ---------------------------------------------------------------------------------------------------  Patient Active Problem List   Diagnosis Date Noted  . At risk for polypharmacy 01/10/2020  . Dizzinesses 01/10/2020  . Decreased GFR 01/10/2020  . Morbid obesity ([place]) 11/23/2018  . Chronic kidney disease, stage 3 (moderate) 11/09/2018  . Limited mobility 09/03/2017  . Fatigue 02/04/2016  . Palpitations 02/04/2016  . Gout 09/24/2015  . Hyperuricemia 08/20/2015  . Back pain 06/28/2015  . Constipation 06/28/2015  . Eczema 06/28/2015  . Edema 06/28/2015  . Pre-diabetes 06/28/2015  . Intertrigo 06/28/2015  . PAC (premature atrial  contraction) 06/28/2015  . Premature heartbeats 06/28/2015  . History of basal cell cancer 02/18/2015  . Fatty infiltration of liver 05/31/2013  . Hypothyroidism 01/15/2011  . Arthropathy of pelvic region and thigh 08/01/2008  . HLD (hyperlipidemia) 03/13/2008  . Arthritis, degenerative 01/26/2007  . Hypertension 12/13/2006  . Personal history of venous thrombosis and embolism 05/24/2006   Past Medical History:  Diagnosis Date  . Cancer ([place])    BCC on nose  .  Hypertension    Social History   Tobacco Use  . Smoking status: Never Smoker  . Smokeless tobacco: Never Used  Substance Use Topics  . Alcohol use: Yes    Alcohol/week: 0.0 standard drinks    Comment: 1 glass of wine seldom  . Drug use: No   Allergies  Allergen Reactions  . Lisinopril     Intense itching per patient can not tolerate   . Amlodipine     dizziness  . Atorvastatin     Other reaction(s): Muscle Pain  . Colestid  [Colestipol Hcl]     Itching, insomnia  . Crestor  [Rosuvastatin Calcium]     Other reaction(s): Muscle Pain  . Doxycycline   . Fluvastatin Sodium     Other reaction(s): Muscle Pain  . Furosemide     Severe Cramping  . Pravastatin Sodium     Numbness in legs  . Verapamil     Shortness of breath, swelling, palpations.   . Allopurinol Other (See Comments)    dizziness  . Penicillins Rash    Took for pneumonia when she was 23 and mader her mouth break out       Medications: Outpatient Medications Prior to Visit  Medication Sig  . aspirin 81 MG tablet Take 81 mg by mouth daily.  . colchicine 0.6 MG tablet TAKE 1 TABLET (0.6 MG TOTAL) BY MOUTH 2 (TWO) TIMES DAILY AS NEEDED (GOUT).  Marland Kitchen levothyroxine (SYNTHROID, LEVOTHROID) 75 MCG tablet Take 1 tablet (75 mcg total) by mouth daily.  . [DISCONTINUED] chlorthalidone (HYGROTON) 25 MG tablet Take 0.5 tablets (12.5 mg total) by mouth daily. In the morning  . Aspirin-Calcium Carbonate 81-777 MG TABS Take by mouth.  . cyclobenzaprine (FLEXERIL) 5 MG tablet TAKE 1 TABLET BY MOUTH 3 (THREE) TIMES DAILY AS NEEDED FOR UP TO 10 DAYS FOR MUSCLE SPASMS.  Marland Kitchen ezetimibe (ZETIA) 10 MG tablet Take 1 tablet (10 mg total) by mouth every morning. (Patient not taking: Reported on 01/25/2020)  . hydrocortisone 2.5 % lotion Apply to face once daily as needed for itchiness  . ketoconazole (NIZORAL) 2 % shampoo Apply topically.  . meloxicam (MOBIC) 15 MG tablet Take 1 tablet (15 mg total) by mouth daily. (Patient not taking:  Reported on 01/10/2020)  . mometasone (ELOCON) 0.1 % ointment APPLY 1 APPLICATION DAILY AS NEEDED TOPICALLY. APPLY TO AFFECTED AREA (Patient not taking: Reported on 01/10/2020)  . Multiple Vitamins-Minerals (MULTIVITAMIN ADULT PO) Take 1 tablet by mouth daily.  . pimecrolimus (ELIDEL) 1 % cream Apply topically 2 (two) times daily. (Patient not taking: Reported on 09/26/2019)  . tacrolimus (PROTOPIC) 0.1 % ointment Apply to itching on face twice daily  . [DISCONTINUED] valsartan (DIOVAN) 40 MG tablet Take 0.5 tablets (20 mg total) by mouth daily. In the morning (Patient not taking: Reported on 01/25/2020)   No facility-administered medications prior to visit.    Review of Systems  Constitutional: Negative.   HENT:       Hard of hearing.   Respiratory: Negative.   Cardiovascular:  Negative.   Gastrointestinal: Negative for abdominal distention and abdominal pain.  Genitourinary: Negative for difficulty urinating.  Neurological: Positive for dizziness (reports overall improved since last vist. ). Negative for tremors, seizures, syncope, facial asymmetry, speech difficulty, weakness, light-headedness, numbness and headaches.  Hematological: Negative.   Psychiatric/Behavioral: Negative for behavioral problems.      Objective    BP (!) 152/80   Pulse 79   Temp (!) 96.6 F (35.9 C) (Oral)   Resp 16   Wt 242 lb 12.8 oz (110.1 kg)   SpO2 98%   BMI 47.42 kg/m  BP Readings from Last 3 Encounters:  01/25/20 (!) 152/80  01/10/20 118/84  01/05/20 (!) 178/84      Physical Exam Constitutional:      General: She is not in acute distress.    Appearance: Normal appearance. She is not ill-appearing, toxic-appearing or diaphoretic.     Comments: Patient is alert and oriented and responsive to questions Engages in eye contact with provider. Speaks in full sentences without any pauses without any shortness of breath or distress.    HENT:     Head: Normocephalic and atraumatic.     Jaw: There is  normal jaw occlusion.     Right Ear: Swelling (bilateral ear canals irritated with mild swelling. ) and tenderness present. A middle ear effusion (yellow white fluid behind intact tympanic membrane, ) is present. There is impacted cerumen (bilateral impaction cerumen unable to visualize tympanic membrane. hard of hearing wears hearing aides. ). Tympanic membrane is erythematous. Tympanic membrane is not perforated.     Left Ear: Swelling and tenderness present. A middle ear effusion is present. There is impacted cerumen. Tympanic membrane is not erythematous.     Ears:     Comments: Cerumen removed after flush with curette Able to visualize the ear canal and documented exam under HEENT  Patient tolerated ear flush without difficulty.      Nose: Nose normal. No congestion or rhinorrhea.     Mouth/Throat:     Mouth: Mucous membranes are moist.     Pharynx: No oropharyngeal exudate or posterior oropharyngeal erythema.  Cardiovascular:     Rate and Rhythm: Normal rate and regular rhythm.     Pulses: Normal pulses.     Heart sounds: Normal heart sounds. No murmur. No friction rub. No gallop.   Pulmonary:     Effort: Pulmonary effort is normal. No respiratory distress.     Breath sounds: Normal breath sounds. No stridor. No wheezing, rhonchi or rales.  Chest:     Chest wall: No tenderness.  Abdominal:     Palpations: Abdomen is soft.  Skin:    General: Skin is warm and dry.     Capillary Refill: Capillary refill takes less than 2 seconds.  Neurological:     General: No focal deficit present.     Mental Status: She is alert and oriented to person, place, and time.     Cranial Nerves: No cranial nerve deficit.     Motor: No weakness.     Gait: Gait normal.     Comments: Neurologic exam:  Speech clear, pupils equal round reactive to light, extraocular movements intact  Normal peripheral visual fields Cranial nerves III through XII normal including no facial droop Follows commands, moves  all extremities x4, normal strength to bilateral upper and lower extremities at all major muscle groups including grip Sensation normal to light touch and pinprick Coordination intact, no limb ataxia, finger-nose-finger normal  Gait  normal    Psychiatric:        Mood and Affect: Mood normal.        Behavior: Behavior normal.        Thought Content: Thought content normal.        Judgment: Judgment normal.       No results found for any visits on 01/25/20.  Assessment & Plan     Essential hypertension - Plan: chlorthalidone (HYGROTON) 25 MG tablet, DISCONTINUED: chlorthalidone (HYGROTON) 25 MG tablet  Bilateral impacted cerumen - Plan: Ear Lavage  Non-recurrent acute serous otitis media of right ear  Other infective acute otitis externa of both ears  Hearing aid worn  Azithromycin and neomycin otic given to use as directed. After irrigating ears - husband recalled to wife that her right ear had been hurting and she agreed.  Medications Discontinued During This Encounter  Medication Reason  . chlorthalidone (HYGROTON) 25 MG tablet   . valsartan (DIOVAN) 40 MG tablet Completed Course  . chlorthalidone (HYGROTON) 25 MG tablet   she has stopped taking Diovan. Discussed with Dr. Rosanna Randy she has felt less dizziness with the 12.5 mg of chlorthalidone, however blood pressure is still elevated will increased as below to 12.5mg  twice daily.  Meds ordered this encounter  Medications  .     . chlorthalidone (HYGROTON) 25 MG tablet    Sig: Take half of tablet 12.5 by mouth twice daily in the morning  and evening Call if consistently over 135/85 blood pressure    Dispense:  60 tablet    Refill:  1   Orders Placed This Encounter  Procedures  . Ear Lavage   Return in about 3 weeks (around 02/15/2020), or if symptoms worsen or fail to improve, for Go to Emergency room/ urgent care if worse.      IWellington Hampshire Naomi Castrogiovanni, FNP, have reviewed all documentation for this visit. The  documentation on 01/25/20 for the exam, diagnosis, procedures, and orders are all accurate and complete.   Advised patient call the office or your primary care doctor for an appointment if no improvement within 72 hours or if any symptoms change or worsen at any time  Advised ER or urgent Care if after hours or on weekend. Call 911 for emergency symptoms at any time.Patinet verbalized understanding of all instructions given/reviewed and treatment plan and has no further questions or concerns at this time.    Addressed extensive list of chronic and acute medical problems today requiring 42 minutes reviewing her medical record, counseling patient regarding her conditions and coordination of care.    Marcille Buffy, [place] [phone] (phone) [phone] (fax)  [place]

## 2020-01-25 NOTE — Patient Instructions (Addendum)
Increasing Chlorthalidone  2 5mg  tablet to take  half tablet 12.5mg  by mouth in the morning and the evening. Call if blood pressure consistently of 135/85 or if any dizziness symptoms persist change or worsen. Emergency room for any severe symptoms. Hydrate with water.  Keep follow up with Dr. Caryn Section 02/20/20 and return sooner if needed.     Hypertension, Adult Hypertension is another name for high blood pressure. High blood pressure forces your heart to work harder to pump blood. This can cause problems over time. There are two numbers in a blood pressure reading. There is a top number (systolic) over a bottom number (diastolic). It is best to have a blood pressure that is below 120/80. Healthy choices can help lower your blood pressure, or you may need medicine to help lower it. What are the causes? The cause of this condition is not known. Some conditions may be related to high blood pressure. What increases the risk?  Smoking.  Having type 2 diabetes mellitus, high cholesterol, or both.  Not getting enough exercise or physical activity.  Being overweight.  Having too much fat, sugar, calories, or salt (sodium) in your diet.  Drinking too much alcohol.  Having long-term (chronic) kidney disease.  Having a family history of high blood pressure.  Age. Risk increases with age.  Race. You may be at higher risk if you are African American.  Gender. Men are at higher risk than women before age 57. After age 32, women are at higher risk than men.  Having obstructive sleep apnea.  Stress. What are the signs or symptoms?  High blood pressure may not cause symptoms. Very high blood pressure (hypertensive crisis) may cause: ? Headache. ? Feelings of worry or nervousness (anxiety). ? Shortness of breath. ? Nosebleed. ? A feeling of being sick to your stomach (nausea). ? Throwing up (vomiting). ? Changes in how you see. ? Very bad chest pain. ? Seizures. How is this  treated?  This condition is treated by making healthy lifestyle changes, such as: ? Eating healthy foods. ? Exercising more. ? Drinking less alcohol.  Your health care provider may prescribe medicine if lifestyle changes are not enough to get your blood pressure under control, and if: ? Your top number is above 130. ? Your bottom number is above 80.  Your personal target blood pressure may vary. Follow these instructions at home: Eating and drinking   If told, follow the DASH eating plan. To follow this plan: ? Fill one half of your plate at each meal with fruits and vegetables. ? Fill one fourth of your plate at each meal with whole grains. Whole grains include whole-wheat pasta, brown rice, and whole-grain bread. ? Eat or drink low-fat dairy products, such as skim milk or low-fat yogurt. ? Fill one fourth of your plate at each meal with low-fat (lean) proteins. Low-fat proteins include fish, chicken without skin, eggs, beans, and tofu. ? Avoid fatty meat, cured and processed meat, or chicken with skin. ? Avoid pre-made or processed food.  Eat less than 1,500 mg of salt each day.  Do not drink alcohol if: ? Your doctor tells you not to drink. ? You are pregnant, may be pregnant, or are planning to become pregnant.  If you drink alcohol: ? Limit how much you use to:  0-1 drink a day for women.  0-2 drinks a day for men. ? Be aware of how much alcohol is in your drink. In the [place] one drink  equals one 12 oz bottle of beer (355 mL), one 5 oz glass of wine (148 mL), or one 1 oz glass of hard liquor (44 mL). Lifestyle   Work with your doctor to stay at a healthy weight or to lose weight. Ask your doctor what the best weight is for you.  Get at least 30 minutes of exercise most days of the week. This may include walking, swimming, or biking.  Get at least 30 minutes of exercise that strengthens your muscles (resistance exercise) at least 3 days a week. This may include  lifting weights or doing Pilates.  Do not use any products that contain nicotine or tobacco, such as cigarettes, e-cigarettes, and chewing tobacco. If you need help quitting, ask your doctor.  Check your blood pressure at home as told by your doctor.  Keep all follow-up visits as told by your doctor. This is important. Medicines  Take over-the-counter and prescription medicines only as told by your doctor. Follow directions carefully.  Do not skip doses of blood pressure medicine. The medicine does not work as well if you skip doses. Skipping doses also puts you at risk for problems.  Ask your doctor about side effects or reactions to medicines that you should watch for. Contact a doctor if you:  Think you are having a reaction to the medicine you are taking.  Have headaches that keep coming back (recurring).  Feel dizzy.  Have swelling in your ankles.  Have trouble with your vision. Get help right away if you:  Get a very bad headache.  Start to feel mixed up (confused).  Feel weak or numb.  Feel faint.  Have very bad pain in your: ? Chest. ? Belly (abdomen).  Throw up more than once.  Have trouble breathing. Summary  Hypertension is another name for high blood pressure.  High blood pressure forces your heart to work harder to pump blood.  For most people, a normal blood pressure is less than 120/80.  Making healthy choices can help lower blood pressure. If your blood pressure does not get lower with healthy choices, you may need to take medicine. This information is not intended to replace advice given to you by your health care provider. Make sure you discuss any questions you have with your health care provider. Document Revised: 04/20/2018 Document Reviewed: 04/20/2018 Elsevier Patient Education  [place].  Dizziness Dizziness is a common problem. It makes you feel unsteady or light-headed. You may feel like you are about to pass out (faint).  Dizziness can lead to getting hurt if you stumble or fall. Dizziness can be caused by many things, including:  Medicines.  Not having enough water in your body (dehydration).  Illness. Follow these instructions at home: Eating and drinking   Drink enough fluid to keep your pee (urine) clear or pale yellow. This helps to keep you from getting dehydrated. Try to drink more clear fluids, such as water.  Do not drink alcohol.  Limit how much caffeine you drink or eat, if your doctor tells you to do that.  Limit how much salt (sodium) you drink or eat, if your doctor tells you to do that. Activity   Avoid making quick movements. ? When you stand up from sitting in a chair, steady yourself until you feel okay. ? In the morning, first sit up on the side of the bed. When you feel okay, stand slowly while you hold onto something. Do this until you know that your balance  is fine.  If you need to stand in one place for a long time, move your legs often. Tighten and relax the muscles in your legs while you are standing.  Do not drive or use heavy machinery if you feel dizzy.  Avoid bending down if you feel dizzy. Place items in your home so you can reach them easily without leaning over. Lifestyle  Do not use any products that contain nicotine or tobacco, such as cigarettes and e-cigarettes. If you need help quitting, ask your doctor.  Try to lower your stress level. You can do this by using methods such as yoga or meditation. Talk with your doctor if you need help. General instructions  Watch your dizziness for any changes.  Take over-the-counter and prescription medicines only as told by your doctor. Talk with your doctor if you think that you are dizzy because of a medicine that you are taking.  Tell a friend or a family member that you are feeling dizzy. If he or she notices any changes in your behavior, have this person call your doctor.  Keep all follow-up visits as told by your  doctor. This is important. Contact a doctor if:  Your dizziness does not go away.  Your dizziness or light-headedness gets worse.  You feel sick to your stomach (nauseous).  You have trouble hearing.  You have new symptoms.  You are unsteady on your feet.  You feel like the room is spinning. Get help right away if:  You throw up (vomit) or have watery poop (diarrhea), and you cannot eat or drink anything.  You have trouble: ? Talking. ? Walking. ? Swallowing. ? Using your arms, hands, or legs.  You feel generally weak.  You are not thinking clearly, or you have trouble forming sentences. A friend or family member may notice this.  You have: ? Chest pain. ? Pain in your belly (abdomen). ? Shortness of breath. ? Sweating.  Your vision changes.  You are bleeding.  You have a very bad headache.  You have neck pain or a stiff neck.  You have a fever. These symptoms may be an emergency. Do not wait to see if the symptoms will go away. Get medical help right away. Call your local emergency services (911 in the [place] Do not drive yourself to the hospital. Summary  Dizziness makes you feel unsteady or light-headed. You may feel like you are about to pass out (faint).  Drink enough fluid to keep your pee (urine) clear or pale yellow. Do not drink alcohol.  Avoid making quick movements if you feel dizzy.  Watch your dizziness for any changes. This information is not intended to replace advice given to you by your health care provider. Make sure you discuss any questions you have with your health care provider. Document Revised: 08/13/2017 Document Reviewed: 08/27/2016 Elsevier Patient Education  [place].

## 2020-01-26 ENCOUNTER — Telehealth: Payer: Self-pay | Admitting: Family Medicine

## 2020-01-26 NOTE — Telephone Encounter (Signed)
[facility] faxed refill request for the following medications:   valsartan (DIOVAN) 40 MG tablet - 90 day supply   Please advise.  Thanks, [employer]

## 2020-01-26 NOTE — Telephone Encounter (Signed)
Yes, It is the local [facility] requesting the refill.  [id number] [place], [place] - [address] (Pharmacy) [phone]    By Neva Seat     I hope this helps.  Thanks, [employer]

## 2020-01-26 NOTE — Telephone Encounter (Signed)
Yes patient has stopped taking it since ER doctor changed. She is not taking at this time she felt it was causing her to be dizzy ( see call) and did not restart at last visit.   She is now taking Chlorthalidone 12.5 mg BID and to return in 3 weeks with Caryn Section Kirstie Peri, MD With list of pressures.

## 2020-01-26 NOTE — Telephone Encounter (Signed)
In note from yesterday it says this was discontinued??

## 2020-01-26 NOTE — Telephone Encounter (Signed)
Will notify pharmacy that this medication has been discontinued.     Kayla Kelp, do you remember which [facility] she has [facility] more than [facility].

## 2020-01-28 ENCOUNTER — Other Ambulatory Visit: Payer: Self-pay | Admitting: Family Medicine

## 2020-01-28 DIAGNOSIS — E039 Hypothyroidism, unspecified: Secondary | ICD-10-CM

## 2020-01-28 DIAGNOSIS — E785 Hyperlipidemia, unspecified: Secondary | ICD-10-CM

## 2020-01-28 NOTE — Telephone Encounter (Signed)
Requested Prescriptions  Pending Prescriptions Disp Refills  . levothyroxine (SYNTHROID) 75 MCG tablet [Pharmacy Med Name: LEVOTHYROXINE 75 MCG TABLET] 90 tablet 2    Sig: TAKE 1 TABLET BY MOUTH EVERY DAY     Endocrinology:  Hypothyroid Agents Failed - 01/28/2020  1:11 AM      Failed - TSH needs to be rechecked within 3 months after an abnormal result. Refill until TSH is due.      Passed - TSH in normal range and within 360 days    TSH  Date Value Ref Range Status  09/15/2019 3.590 0.450 - 4.500 uIU/mL Final         Passed - Valid encounter within last 12 months    Recent Outpatient Visits          3 days ago Essential hypertension   [place], Kelby Aline, FNP   2 weeks ago Essential hypertension   [place], Kelby Aline, FNP   2 months ago Essential hypertension   [facility], Kirstie Peri, MD   4 months ago [place], Donald E, MD   5 months ago Closed fracture of sacrum with routine healing, unspecified portion of sacrum, subsequent encounter   [facility] Birdie Sons, MD      Future Appointments            In 3 weeks [place], Kirstie Peri, MD [facility]           . ezetimibe (ZETIA) 10 MG tablet [Pharmacy Med Name: EZETIMIBE 10 MG TABLET] 90 tablet 2    Sig: TAKE 1 TABLET BY MOUTH EVERY DAY IN THE MORNING     Cardiovascular:  Antilipid - Sterol Transport Inhibitors Failed - 01/28/2020  1:11 AM      Failed - Total Cholesterol in normal range and within 360 days    Cholesterol, Total  Date Value Ref Range Status  09/15/2019 267 (H) 100 - 199 mg/dL Final         Failed - LDL in normal range and within 360 days    LDL Chol Calc (NIH)  Date Value Ref Range Status  09/15/2019 165 (H) 0 - 99 mg/dL Final         Failed - Triglycerides in normal range and within 360 days    Triglycerides  Date Value Ref Range Status  09/15/2019  260 (H) 0 - 149 mg/dL Final         Passed - HDL in normal range and within 360 days    HDL  Date Value Ref Range Status  09/15/2019 53 >39 mg/dL Final         Passed - Valid encounter within last 12 months    Recent Outpatient Visits          3 days ago Essential hypertension   [place], Kelby Aline, FNP   2 weeks ago Essential hypertension   [place], Kelby Aline, FNP   2 months ago Essential hypertension   [facility] Birdie Sons, MD   4 months ago Dizziness   [facility] Birdie Sons, MD   5 months ago Closed fracture of sacrum with routine healing, unspecified portion of sacrum, subsequent encounter   [place], MD      Future Appointments            In 3 weeks Caryn Section,  Kirstie Peri, MD [facility], [place]

## 2020-01-29 NOTE — Telephone Encounter (Signed)
Pharmacy has been notified.

## 2020-02-19 NOTE — Progress Notes (Signed)
I,Roshena L Chambers,acting as a scribe for Lelon Huh, MD.,have documented all relevant documentation on the behalf of Lelon Huh, MD,as directed by  Lelon Huh, MD while in the presence of Lelon Huh, MD.   Established patient visit   Patient: Kayla Chan   DOB: 1931-12-02   86 y.o. Female  MRN: [id number] Visit Date: 02/20/2020  Today's healthcare provider: Lelon Huh, MD   Chief Complaint  Patient presents with  . Hypertension   Subjective    HPI Hypertension, follow-up  BP Readings from Last 3 Encounters:  02/20/20 134/76  01/25/20 (!) 152/80  01/10/20 118/84   Wt Readings from Last 3 Encounters:  02/20/20 244 lb (110.7 kg)  01/25/20 242 lb 12.8 oz (110.1 kg)  01/10/20 241 lb 6.4 oz (109.5 kg)     She was last seen for hypertension 3 weeks ago when she saw Sharyn Lull for cerumen impaction BP at that visit was 152/80. Management since that visit includes continuing Chlorthalidone 12.5 mg BID . She was previously seen in March and lisinopril was changed to valsartan due to intolerance, which she subsequently discontinued due to dizziness.  She reports good compliance with treatment. She is not having side effects.  She is following a Regular diet. She is not exercising. She does not smoke.  Use of agents associated with hypertension: NSAIDS.   Outside blood pressures are 120/60. Symptoms: No chest pain No chest pressure  No palpitations No syncope  No dyspnea No orthopnea  No paroxysmal nocturnal dyspnea Yes lower extremity edema   Pertinent labs: Lab Results  Component Value Date   CHOL 267 (H) 09/15/2019   HDL 53 09/15/2019   LDLCALC 165 (H) 09/15/2019   TRIG 260 (H) 09/15/2019   CHOLHDL 5.0 (H) 09/15/2019   Lab Results  Component Value Date   NA 140 01/05/2020   K 3.7 01/05/2020   CREATININE 1.10 (H) 01/05/2020   GFRNONAA 46 (L) 01/05/2020   GFRAA 53 (L) 01/05/2020   GLUCOSE 172 (H) 01/05/2020     The ASCVD Risk score Mikey Bussing  DC Jr., et al., 2013) failed to calculate for the following reasons:   The 2013 ASCVD risk score is only valid for ages 25 to 53   ---------------------------------------------------------------------------------------------------      Medications: Outpatient Medications Prior to Visit  Medication Sig  . aspirin 81 MG tablet Take 81 mg by mouth daily.  . chlorthalidone (HYGROTON) 25 MG tablet Take half of tablet 12.5 by mouth twice daily in the morning  and evening Call if consistently over 135/85 blood pressure  . ezetimibe (ZETIA) 10 MG tablet TAKE 1 TABLET BY MOUTH EVERY DAY IN THE MORNING  . levothyroxine (SYNTHROID) 75 MCG tablet TAKE 1 TABLET BY MOUTH EVERY DAY  . Multiple Vitamins-Minerals (MULTIVITAMIN ADULT PO) Take 1 tablet by mouth daily.  . [DISCONTINUED] Aspirin-Calcium Carbonate 81-777 MG TABS Take by mouth.  . colchicine 0.6 MG tablet TAKE 1 TABLET (0.6 MG TOTAL) BY MOUTH 2 (TWO) TIMES DAILY AS NEEDED (GOUT). (Patient not taking: Reported on 02/20/2020)  . cyclobenzaprine (FLEXERIL) 5 MG tablet TAKE 1 TABLET BY MOUTH 3 (THREE) TIMES DAILY AS NEEDED FOR UP TO 10 DAYS FOR MUSCLE SPASMS. (Patient not taking: Reported on 02/20/2020)  . hydrocortisone 2.5 % lotion Apply to face once daily as needed for itchiness (Patient not taking: Reported on 02/20/2020)  . ketoconazole (NIZORAL) 2 % shampoo Apply topically. (Patient not taking: Reported on 02/20/2020)  . meloxicam (MOBIC) 15 MG tablet Take 1  tablet (15 mg total) by mouth daily. (Patient not taking: Reported on 02/20/2020)  . mometasone (ELOCON) 0.1 % ointment APPLY 1 APPLICATION DAILY AS NEEDED TOPICALLY. APPLY TO AFFECTED AREA (Patient not taking: Reported on 02/20/2020)  . NEOMYCIN-POLYMYXIN-HYDROCORTISONE (CORTISPORIN) 1 % SOLN OTIC solution Place 3 drops into both ears every 6 (six) hours. (Patient not taking: Reported on 02/20/2020)  . pimecrolimus (ELIDEL) 1 % cream Apply topically 2 (two) times daily. (Patient not taking:  Reported on 02/20/2020)  . tacrolimus (PROTOPIC) 0.1 % ointment Apply to itching on face twice daily (Patient not taking: Reported on 02/20/2020)  . [DISCONTINUED] azithromycin (ZITHROMAX) 250 MG tablet By mouth Take 2 tablets day 1 (500mg  total) and 1 tablet ( 250 mg ) on days 2,3,4,5. (Patient not taking: Reported on 02/20/2020)   No facility-administered medications prior to visit.    Review of Systems  Constitutional: Negative.  Negative for appetite change, chills, fatigue and fever.  Respiratory: Negative.  Negative for chest tightness and shortness of breath.   Cardiovascular: Positive for leg swelling (right foot and leg). Negative for chest pain and palpitations.  Gastrointestinal: Negative for abdominal pain, nausea and vomiting.  Musculoskeletal: Positive for arthralgias (right leg ).  Neurological: Negative for dizziness and weakness.      Objective    BP 134/76 (BP Location: Right Wrist, Cuff Size: Normal)   Pulse 82   Temp (!) 96.8 F (36 C) (Temporal)   Resp 20   Wt 244 lb (110.7 kg)   SpO2 97% Comment: room air  BMI 47.65 kg/m    Physical Exam   General: Appearance:    Severely obese female in no acute distress  Eyes:    PERRL, conjunctiva/corneas clear, EOM's intact       Lungs:     Clear to auscultation bilaterally, respirations unlabored  Heart:    Normal heart rate. Normal rhythm. No murmurs, rubs, or gallops.   MS:   All extremities are intact. Moderate swelling of right ankle and MTP of great toe.   Neurologic:   Awake, alert, oriented x 3. No apparent focal neurological           defect.        No results found for any visits on 02/20/20.  Assessment & Plan     1. Essential hypertension Fairly well controlled, although she has chronic dizziness which does not seem to be any different regardless of the BP medication she takes. Is to continue current medication for the time being. She report a history of inadequate fluid intake and was encouraged to  drink 2-3 additional glasses of water every day.  2. Gouty arthritis of left ankle Recommend she start back on colchicine, which she states she has plenty of at home.   3. Dizzinesses Chronic and stable. Likely some mild orthostasis. Encouraged increased water intake as above and caution when standing from sitting or lying position.   Follow up 4 months.    No follow-ups on file.      The entirety of the information documented in the History of Present Illness, Review of Systems and Physical Exam were personally obtained by me. Portions of this information were initially documented by the CMA and reviewed by me for thoroughness and accuracy.      Lelon Huh, MD  [facility] [phone] (phone) [phone] (fax)  [place]

## 2020-02-20 ENCOUNTER — Encounter: Payer: Self-pay | Admitting: Family Medicine

## 2020-02-20 ENCOUNTER — Ambulatory Visit (INDEPENDENT_AMBULATORY_CARE_PROVIDER_SITE_OTHER): Payer: PPO | Admitting: Family Medicine

## 2020-02-20 ENCOUNTER — Other Ambulatory Visit: Payer: Self-pay

## 2020-02-20 VITALS — BP 134/76 | HR 82 | Temp 96.8°F | Resp 20 | Wt 244.0 lb

## 2020-02-20 DIAGNOSIS — I10 Essential (primary) hypertension: Secondary | ICD-10-CM | POA: Diagnosis not present

## 2020-02-20 DIAGNOSIS — M109 Gout, unspecified: Secondary | ICD-10-CM

## 2020-02-20 DIAGNOSIS — R42 Dizziness and giddiness: Secondary | ICD-10-CM

## 2020-02-20 NOTE — Patient Instructions (Signed)
. Please review the attached list of medications and notify my office if there are any errors.   . Please bring all of your medications to every appointment so we can make sure that our medication list is the same as yours.    Low-Purine Eating Plan to reduce gout flare ups A low-purine eating plan involves making food choices to limit your intake of purine. Purine is a kind of uric acid. Too much uric acid in your blood can cause certain conditions, such as gout and kidney stones. Eating a low-purine diet can help control these conditions. What are tips for following this plan? Reading food labels   Avoid foods with saturated or Trans fat.  Check the ingredient list of grains-based foods, such as bread and cereal, to make sure that they contain whole grains.  Check the ingredient list of sauces or soups to make sure they do not contain meat or fish.  When choosing soft drinks, check the ingredient list to make sure they do not contain high-fructose corn syrup. Shopping  Buy plenty of fresh fruits and vegetables.  Avoid buying canned or fresh fish.  Buy dairy products labeled as low-fat or nonfat.  Avoid buying premade or processed foods. These foods are often high in fat, salt (sodium), and added sugar. Cooking  Use olive oil instead of butter when cooking. Oils like olive oil, canola oil, and sunflower oil contain healthy fats. Meal planning  Learn which foods do or do not affect you. If you find out that a food tends to cause your gout symptoms to flare up, avoid eating that food. You can enjoy foods that do not cause problems. If you have any questions about a food item, talk with your dietitian or health care provider.  Limit foods high in fat, especially saturated fat. Fat makes it harder for your body to get rid of uric acid.  Choose foods that are lower in fat and are lean sources of protein. General guidelines  Limit alcohol intake to no more than 1 drink a day for  nonpregnant women and 2 drinks a day for men. One drink equals 12 oz of beer, 5 oz of wine, or 1 oz of hard liquor. Alcohol can affect the way your body gets rid of uric acid.  Drink plenty of water to keep your urine clear or pale yellow. Fluids can help remove uric acid from your body.  If directed by your health care provider, take a vitamin C supplement.  Work with your health care provider and dietitian to develop a plan to achieve or maintain a healthy weight. Losing weight can help reduce uric acid in your blood. What foods are recommended? The items listed may not be a complete list. Talk with your dietitian about what dietary choices are best for you. Foods low in purines Foods low in purines do not need to be limited. These include:  All fruits.  All low-purine vegetables, pickles, and olives.  Breads, pasta, rice, cornbread, and popcorn. Cake and other baked goods.  All dairy foods.  Eggs, nuts, and nut butters.  Spices and condiments, such as salt, herbs, and vinegar.  Plant oils, butter, and margarine.  Water, sugar-free soft drinks, tea, coffee, and cocoa.  Vegetable-based soups, broths, sauces, and gravies. Foods moderate in purines Foods moderate in purines should be limited to the amounts listed.   cup of asparagus, cauliflower, spinach, mushrooms, or green peas, each day.  2/3 cup uncooked oatmeal, each day.  cup dry wheat bran or wheat germ, each day.  2-3 ounces of meat or poultry, each day.  4-6 ounces of shellfish, such as crab, lobster, oysters, or shrimp, each day.  1 cup cooked beans, peas, or lentils, each day.  Soup, broths, or bouillon made from meat or fish. Limit these foods as much as possible. What foods are not recommended? The items listed may not be a complete list. Talk with your dietitian about what dietary choices are best for you. Limit your intake of foods high in purines, including:  Beer and other alcohol.  Meat-based  gravy or sauce.  Canned or fresh fish, such as: ? Anchovies, sardines, herring, and tuna. ? Mussels and scallops. ? Codfish, trout, and haddock.  Berniece Salines.  Organ meats, such as: ? Liver or kidney. ? Tripe. ? Sweetbreads (thymus gland or pancreas).  Wild Clinical biochemist.  Yeast or yeast extract supplements.  Drinks sweetened with high-fructose corn syrup. Summary  Eating a low-purine diet can help control conditions caused by too much uric acid in the body, such as gout or kidney stones.  Choose low-purine foods, limit alcohol, and limit foods high in fat.  You will learn over time which foods do or do not affect you. If you find out that a food tends to cause your gout symptoms to flare up, avoid eating that food. This information is not intended to replace advice given to you by your health care provider. Make sure you discuss any questions you have with your health care provider. Document Revised: 07/23/2017 Document Reviewed: 09/23/2016 Elsevier Patient Education  2020 [employer].

## 2020-04-16 ENCOUNTER — Encounter: Payer: Self-pay | Admitting: Family Medicine

## 2020-04-16 ENCOUNTER — Ambulatory Visit (INDEPENDENT_AMBULATORY_CARE_PROVIDER_SITE_OTHER): Payer: PPO | Admitting: Family Medicine

## 2020-04-16 ENCOUNTER — Other Ambulatory Visit: Payer: Self-pay

## 2020-04-16 VITALS — BP 152/70 | HR 63 | Temp 98.0°F | Ht 60.0 in | Wt 248.0 lb

## 2020-04-16 DIAGNOSIS — R42 Dizziness and giddiness: Secondary | ICD-10-CM | POA: Diagnosis not present

## 2020-04-16 DIAGNOSIS — M10279 Drug-induced gout, unspecified ankle and foot: Secondary | ICD-10-CM | POA: Diagnosis not present

## 2020-04-16 DIAGNOSIS — R7303 Prediabetes: Secondary | ICD-10-CM

## 2020-04-16 DIAGNOSIS — I10 Essential (primary) hypertension: Secondary | ICD-10-CM | POA: Diagnosis not present

## 2020-04-16 DIAGNOSIS — E039 Hypothyroidism, unspecified: Secondary | ICD-10-CM | POA: Diagnosis not present

## 2020-04-16 MED ORDER — ALLOPURINOL 100 MG PO TABS: 100.0000 mg | ORAL_TABLET | Freq: Every day | ORAL | 6 refills | Status: DC

## 2020-04-16 MED ORDER — COLCHICINE 0.6 MG PO TABS: 0.6000 mg | ORAL_TABLET | Freq: Two times a day (BID) | ORAL | 0 refills | Status: DC | PRN

## 2020-04-16 NOTE — Patient Instructions (Signed)
.   Skip chlorthalidone for the next 2-3 days to see if dizziness and dry mouth get better

## 2020-04-16 NOTE — Progress Notes (Signed)
Established patient visit   Patient: Kayla Chan   DOB: 1932/05/11   86 y.o. Female  MRN: [id number] Visit Date: 04/16/2020  Today's healthcare provider: Lelon Huh, MD   Chief Complaint  Patient presents with  . Dizziness   Subjective    HPI  Dizziness  BP Readings from Last 3 Encounters:  04/16/20 (!) 152/70  02/20/20 134/76  01/25/20 (!) 152/80   Wt Readings from Last 3 Encounters:  04/16/20 248 lb (112.5 kg)  02/20/20 244 lb (110.7 kg)  01/25/20 242 lb 12.8 oz (110.1 kg)     Patient reports that she is having dizzy spells, feeling off balance, dry mouth, and feeling fatigued. She feels that it is coming from taking chlorthalidone 12.5mg  twice daily. She is also having muscle cramps, but reports that this comes and goes.   Outside blood pressures are checked occasionally. She reports that it averages in the 130s/70s Symptoms: No chest pain No chest pressure  No palpitations No syncope  No dyspnea No orthopnea  No paroxysmal nocturnal dyspnea No lower extremity edema   Pertinent labs: Lab Results  Component Value Date   CHOL 267 (H) 09/15/2019   HDL 53 09/15/2019   LDLCALC 165 (H) 09/15/2019   TRIG 260 (H) 09/15/2019   CHOLHDL 5.0 (H) 09/15/2019   Lab Results  Component Value Date   NA 140 01/05/2020   K 3.7 01/05/2020   CREATININE 1.10 (H) 01/05/2020   GFRNONAA 46 (L) 01/05/2020   GFRAA 53 (L) 01/05/2020   GLUCOSE 172 (H) 01/05/2020     The ASCVD Risk score (Goff DC Jr., et al., 2013) failed to calculate for the following reasons:   The 2013 ASCVD risk score is only valid for ages 66 to 34   She also feels unsteady and off balance most of the time. Is worse she first stands up, but persists even when walking.      Medications: Outpatient Medications Prior to Visit  Medication Sig  . chlorthalidone (HYGROTON) 25 MG tablet Take half of tablet 12.5 by mouth twice daily in the morning  and evening Call if consistently over 135/85 blood  pressure  . ezetimibe (ZETIA) 10 MG tablet TAKE 1 TABLET BY MOUTH EVERY DAY IN THE MORNING  . levothyroxine (SYNTHROID) 75 MCG tablet TAKE 1 TABLET BY MOUTH EVERY DAY  . Multiple Vitamins-Minerals (MULTIVITAMIN ADULT PO) Take 1 tablet by mouth daily.  Marland Kitchen aspirin 81 MG tablet Take 81 mg by mouth daily.  . colchicine 0.6 MG tablet TAKE 1 TABLET (0.6 MG TOTAL) BY MOUTH 2 (TWO) TIMES DAILY AS NEEDED (GOUT). (Patient not taking: Reported on 02/20/2020)  . hydrocortisone 2.5 % lotion Apply to face once daily as needed for itchiness (Patient not taking: Reported on 02/20/2020)  . ketoconazole (NIZORAL) 2 % shampoo Apply topically. (Patient not taking: Reported on 02/20/2020)  . meloxicam (MOBIC) 15 MG tablet Take 1 tablet (15 mg total) by mouth daily. (Patient not taking: Reported on 02/20/2020)  . mometasone (ELOCON) 0.1 % ointment APPLY 1 APPLICATION DAILY AS NEEDED TOPICALLY. APPLY TO AFFECTED AREA (Patient not taking: Reported on 02/20/2020)  . pimecrolimus (ELIDEL) 1 % cream Apply topically 2 (two) times daily. (Patient not taking: Reported on 02/20/2020)  . tacrolimus (PROTOPIC) 0.1 % ointment Apply to itching on face twice daily (Patient not taking: Reported on 02/20/2020)   No facility-administered medications prior to visit.    Review of Systems  Constitutional: Positive for activity change and fatigue.  Respiratory: Negative.  Cardiovascular: Positive for leg swelling.  Musculoskeletal: Positive for arthralgias, joint swelling and myalgias.  Neurological: Positive for dizziness and light-headedness. Negative for tremors, syncope and weakness.      Objective    BP (!) 152/70 (BP Location: Right Wrist)   Pulse 63   Temp 98 F (36.7 C)   Ht 5' (1.524 m)   Wt 248 lb (112.5 kg)   BMI 48.43 kg/m    Physical Exam  General appearance: Severely obese female, cooperative and in no acute distress Head: Normocephalic, without obvious abnormality, atraumatic Respiratory: Respirations even and  unlabored, normal respiratory rate Extremities: All extremities are intact. 3+ left foot and ankle edema, 2+ on right.  Skin: Skin color, texture, turgor normal. No rashes seen  Psych: Appropriate mood and affect. Neurologic: Unsteady walking gait. Is only able to take 4-5 steps without the use of cane.     Assessment & Plan     1. Essential hypertension Unclear if dizziness is related to chlorthalidone since she has previously had dizziness with several other BP medications. She doesn't think she had dizziness with lisinopril, but had to stop due to itching. She was tried on very low dose of valsartan but did have dizziness when taking that.  - Magnesium  2. Dizzinesses Unclear if any relationship to BP medications, but she does have significant gait disturbance. Will put chlorthalidone on hold for 2-3 days and follow up by phone. Consider physical therapy referral.  - CBC - Comprehensive metabolic panel  3. Hypothyroidism, unspecified type Due to check- TSH  4. Pre-diabetes  - Hemoglobin A1c  5. Drug-induced gout of foot, unspecified chronicity, unspecified laterality She takes colchicine fairly frequently, will check baseline- Uric acid and start- allopurinol (ZYLOPRIM) 100 MG tablet; Take 1 tablet (100 mg total) by mouth daily.  Dispense: 30 tablet; Refill: 6 refill colchicine 0.6 MG tablet; Take 1 tablet (0.6 mg total) by mouth 2 (two) times daily as needed (gout).  Dispense: 30 tablet; Refill: 0   No follow-ups on file.      The entirety of the information documented in the History of Present Illness, Review of Systems and Physical Exam were personally obtained by me. Portions of this information were initially documented by the CMA and reviewed by me for thoroughness and accuracy.      Lelon Huh, MD  [facility] [phone] (phone) [phone] (fax)  [place]

## 2020-04-17 LAB — HEMOGLOBIN A1C
Est. average glucose Bld gHb Est-mCnc: 134 mg/dL
Hgb A1c MFr Bld: 6.3 % — ABNORMAL HIGH (ref 4.8–5.6)

## 2020-04-17 LAB — COMPREHENSIVE METABOLIC PANEL
ALT: 11 IU/L (ref 0–32)
AST: 18 IU/L (ref 0–40)
Albumin/Globulin Ratio: 1.4 (ref 1.2–2.2)
Albumin: 4 g/dL (ref 3.6–4.6)
Alkaline Phosphatase: 83 IU/L (ref 48–121)
BUN/Creatinine Ratio: 23 (ref 12–28)
BUN: 28 mg/dL — ABNORMAL HIGH (ref 8–27)
Bilirubin Total: 0.2 mg/dL (ref 0.0–1.2)
CO2: 24 mmol/L (ref 20–29)
Calcium: 9.7 mg/dL (ref 8.7–10.3)
Chloride: 103 mmol/L (ref 96–106)
Creatinine, Ser: 1.21 mg/dL — ABNORMAL HIGH (ref 0.57–1.00)
GFR calc Af Amer: 47 mL/min/{1.73_m2} — ABNORMAL LOW (ref >59)
GFR calc non Af Amer: 41 mL/min/{1.73_m2} — ABNORMAL LOW (ref >59)
Globulin, Total: 2.8 g/dL (ref 1.5–4.5)
Glucose: 96 mg/dL (ref 65–99)
Potassium: 4.1 mmol/L (ref 3.5–5.2)
Sodium: 141 mmol/L (ref 134–144)
Total Protein: 6.8 g/dL (ref 6.0–8.5)

## 2020-04-17 LAB — MAGNESIUM: Magnesium: 2.3 mg/dL (ref 1.6–2.3)

## 2020-04-17 LAB — TSH: TSH: 1.75 u[IU]/mL (ref 0.450–4.500)

## 2020-04-17 LAB — CBC
Hematocrit: 42.5 % (ref 34.0–46.6)
Hemoglobin: 13.8 g/dL (ref 11.1–15.9)
MCH: 30.3 pg (ref 26.6–33.0)
MCHC: 32.5 g/dL (ref 31.5–35.7)
MCV: 93 fL (ref 79–97)
Platelets: 334 10*3/uL (ref 150–450)
RBC: 4.56 x10E6/uL (ref 3.77–5.28)
RDW: 13 % (ref 11.7–15.4)
WBC: 9.7 10*3/uL (ref 3.4–10.8)

## 2020-04-17 LAB — URIC ACID: Uric Acid: 10.7 mg/dL — ABNORMAL HIGH (ref 3.1–7.9)

## 2020-04-19 ENCOUNTER — Telehealth: Payer: Self-pay | Admitting: Family Medicine

## 2020-04-19 DIAGNOSIS — R29898 Other symptoms and signs involving the musculoskeletal system: Secondary | ICD-10-CM

## 2020-04-19 DIAGNOSIS — R269 Unspecified abnormalities of gait and mobility: Secondary | ICD-10-CM

## 2020-04-19 MED ORDER — LOSARTAN POTASSIUM 25 MG PO TABS: ORAL_TABLET | ORAL | 1 refills | Status: DC

## 2020-04-19 NOTE — Telephone Encounter (Signed)
Please let patient know her labs were all normal except for high uric acid... which is what causes gout. This should come done after she starts taking the allopurinol for a few weeks.   Please see if her dizziness is any better since stopping the chlorthalidone. If so will change to a different blood pressure medication.   Either way I would recommend referral to physical therapy for lower extremity strength and balance training.

## 2020-04-19 NOTE — Telephone Encounter (Signed)
Have sent prescription for losartan to her pharmacy. Schedule follow up for BP check in 1 month.

## 2020-04-19 NOTE — Telephone Encounter (Signed)
Patient advised as below. She states her dizziness has improved since stopping Chlorthalidone. She would like a new blood pressure medication sent in to [facility] [address].  She states the cramps and pain in her joints has also improved. Patient agrees to proceed with PT referral. Referral order pended. Do you have a preference for PT location?

## 2020-04-19 NOTE — Telephone Encounter (Signed)
Patient advised. Follow up appointment scheduled for 05/21/2020 at 1:20pm.

## 2020-04-26 ENCOUNTER — Other Ambulatory Visit: Payer: Self-pay | Admitting: Family Medicine

## 2020-04-26 DIAGNOSIS — M10279 Drug-induced gout, unspecified ankle and foot: Secondary | ICD-10-CM

## 2020-05-08 ENCOUNTER — Other Ambulatory Visit: Payer: Self-pay | Admitting: Family Medicine

## 2020-05-08 DIAGNOSIS — M10279 Drug-induced gout, unspecified ankle and foot: Secondary | ICD-10-CM

## 2020-05-08 NOTE — Telephone Encounter (Signed)
Requested medication (s) are due for refill today: No - early  Requested medication (s) are on the active medication list: Yes  Last refill:  04/26/20  Future visit scheduled: Yes  Notes to clinic:  Pharmacy requesting refill early.    Requested Prescriptions  Pending Prescriptions Disp Refills   colchicine 0.6 MG tablet [Pharmacy Med Name: COLCHICINE 0.6 MG TABLET] 30 tablet 0    Sig: TAKE 1 TABLET (0.6 MG TOTAL) BY MOUTH 2 (TWO) TIMES DAILY AS NEEDED (GOUT).      Endocrinology:  Gout Agents Failed - 05/08/2020  8:31 AM      Failed - Uric Acid in normal range and within 360 days    Uric Acid  Date Value Ref Range Status  04/16/2020 10.7 (H) 3.1 - 7.9 mg/dL Final    Comment:               Therapeutic target for gout patients: <6.0          Failed - Cr in normal range and within 360 days    Creatinine  Date Value Ref Range Status  08/05/2013 1.10 0.60 - 1.30 mg/dL Final   Creatinine, Ser  Date Value Ref Range Status  04/16/2020 1.21 (H) 0.57 - 1.00 mg/dL Final          Passed - Valid encounter within last 12 months    Recent Outpatient Visits           3 weeks ago Essential hypertension   [facility] Birdie Sons, MD   2 months ago Essential hypertension   [facility] Birdie Sons, MD   3 months ago Essential hypertension   [place], Kelby Aline, FNP   3 months ago Essential hypertension   [place], Kelby Aline, FNP   5 months ago Essential hypertension   [facility], Kirstie Peri, MD       Future Appointments             In 1 week Fisher, Kirstie Peri, MD [facility], [place]   In 1 month Fisher, Kirstie Peri, MD [facility], [place]

## 2020-05-09 ENCOUNTER — Other Ambulatory Visit: Payer: Self-pay | Admitting: Family Medicine

## 2020-05-09 DIAGNOSIS — L309 Dermatitis, unspecified: Secondary | ICD-10-CM

## 2020-05-10 DIAGNOSIS — M3501 Sicca syndrome with keratoconjunctivitis: Secondary | ICD-10-CM | POA: Diagnosis not present

## 2020-05-16 ENCOUNTER — Other Ambulatory Visit: Payer: Self-pay | Admitting: Family Medicine

## 2020-05-16 NOTE — Telephone Encounter (Signed)
Appointment 05/21/20- to follow up BP- will need to change Sig/number on RF

## 2020-05-20 DIAGNOSIS — H2511 Age-related nuclear cataract, right eye: Secondary | ICD-10-CM | POA: Diagnosis not present

## 2020-05-21 ENCOUNTER — Encounter: Payer: Self-pay | Admitting: Family Medicine

## 2020-05-21 ENCOUNTER — Ambulatory Visit (INDEPENDENT_AMBULATORY_CARE_PROVIDER_SITE_OTHER): Payer: PPO | Admitting: Family Medicine

## 2020-05-21 ENCOUNTER — Other Ambulatory Visit: Payer: Self-pay

## 2020-05-21 VITALS — BP 156/75 | HR 82 | Temp 98.0°F | Ht 60.0 in | Wt 250.0 lb

## 2020-05-21 DIAGNOSIS — R42 Dizziness and giddiness: Secondary | ICD-10-CM

## 2020-05-21 DIAGNOSIS — R609 Edema, unspecified: Secondary | ICD-10-CM | POA: Diagnosis not present

## 2020-05-21 DIAGNOSIS — I10 Essential (primary) hypertension: Secondary | ICD-10-CM | POA: Diagnosis not present

## 2020-05-21 DIAGNOSIS — Z23 Encounter for immunization: Secondary | ICD-10-CM

## 2020-05-21 DIAGNOSIS — M10279 Drug-induced gout, unspecified ankle and foot: Secondary | ICD-10-CM | POA: Diagnosis not present

## 2020-05-21 DIAGNOSIS — E790 Hyperuricemia without signs of inflammatory arthritis and tophaceous disease: Secondary | ICD-10-CM

## 2020-05-21 MED ORDER — DICLOFENAC POTASSIUM 50 MG PO TABS: ORAL_TABLET | ORAL | 2 refills | Status: DC

## 2020-05-21 MED ORDER — HYDROCHLOROTHIAZIDE 25 MG PO TABS: 25.0000 mg | ORAL_TABLET | Freq: Every day | ORAL | 1 refills | Status: DC

## 2020-05-21 NOTE — Progress Notes (Signed)
Established patient visit   Patient: Kayla Chan   DOB: 03/11/1932   86 y.o. Female  MRN: [id number] Visit Date: 05/21/2020  Today's healthcare provider: Lelon Huh, MD   Chief Complaint  Patient presents with  . Hypertension  . Dizziness   Subjective    HPI  Hypertension, follow-up  BP Readings from Last 3 Encounters:  05/21/20 (!) 156/75  04/16/20 (!) 152/70  02/20/20 134/76   Wt Readings from Last 3 Encounters:  05/21/20 250 lb (113.4 kg)  04/16/20 248 lb (112.5 kg)  02/20/20 244 lb (110.7 kg)     She was last seen for hypertension 1 months ago.  BP at that visit was 152/72. Management since that visit includes holding chlorthalidone for 2-3 days to see if dizziness improved. She reports that she is still having dizziness.   She reports fair compliance with treatment. She is having side effects.  She is following a Regular diet. She is not exercising. She does not smoke.  Use of agents associated with hypertension: none.   Outside blood pressures are checked daily. She averages in the 150s-140s/80s at home.   Since stopping chlorthalidone the swelling in her legs has gotten much worse. She was started on a very very low dose of losartan and states that she gets extremely dizzy within minutes of taking medication, and that it makes her legs itch just like lisinopril in the past.       Medications: Outpatient Medications Prior to Visit  Medication Sig  . aspirin 81 MG tablet Take 81 mg by mouth daily.  . chlorthalidone (HYGROTON) 25 MG tablet Take half of tablet 12.5 by mouth twice daily in the morning  and evening Call if consistently over 135/85 blood pressure  . colchicine 0.6 MG tablet Take 1 tablet (0.6 mg total) by mouth daily as needed (gout).  Marland Kitchen ezetimibe (ZETIA) 10 MG tablet TAKE 1 TABLET BY MOUTH EVERY DAY IN THE MORNING  . levothyroxine (SYNTHROID) 75 MCG tablet TAKE 1 TABLET BY MOUTH EVERY DAY  . losartan (COZAAR) 25 MG tablet TAKE ONE  TABLET DAILY FOR 14 DAYS, THEN INCREASE TO TWO TABLET DAILY  . mometasone (ELOCON) 0.1 % ointment APPLY 1 APPLICATION DAILY AS NEEDED TOPICALLY. APPLY TO AFFECTED AREA  . Multiple Vitamins-Minerals (MULTIVITAMIN ADULT PO) Take 1 tablet by mouth daily.  Marland Kitchen allopurinol (ZYLOPRIM) 100 MG tablet Take 1 tablet (100 mg total) by mouth daily. (Patient not taking: Reported on 05/21/2020)  . hydrocortisone 2.5 % lotion Apply to face once daily as needed for itchiness (Patient not taking: Reported on 02/20/2020)  . ketoconazole (NIZORAL) 2 % shampoo Apply topically. (Patient not taking: Reported on 02/20/2020)  . meloxicam (MOBIC) 15 MG tablet Take 1 tablet (15 mg total) by mouth daily. (Patient not taking: Reported on 02/20/2020)  . pimecrolimus (ELIDEL) 1 % cream Apply topically 2 (two) times daily. (Patient not taking: Reported on 02/20/2020)  . tacrolimus (PROTOPIC) 0.1 % ointment Apply to itching on face twice daily (Patient not taking: Reported on 02/20/2020)   No facility-administered medications prior to visit.    Review of Systems  Constitutional: Negative.   Respiratory: Negative.   Cardiovascular: Negative.   Musculoskeletal: Negative.   Neurological: Positive for dizziness and light-headedness.      Objective    BP (!) 156/75   Pulse 82   Temp 98 F (36.7 C)   Ht 5' (1.524 m)   Wt 250 lb (113.4 kg)   BMI 48.82 kg/m  Physical Exam   General: Appearance:    Obese female in no acute distress  Eyes:    PERRL, conjunctiva/corneas clear, EOM's intact       Lungs:     Clear to auscultation bilaterally, respirations unlabored  Heart:    Normal heart rate. Normal rhythm. No murmurs, rubs, or gallops.   MS:   All extremities are intact. 3+ bilateral foot ankle and lower leg edema.   Neurologic:   Awake, alert, oriented x 3. No apparent focal neurological           defect.         No results found for any visits on 05/21/20.  Assessment & Plan     1. Dizzinesses Seems to  experience irregardless of the blood pressure medication she takes. This is probably psychosomatic and unrelated to the medication as it occurs within minutes of taking even the least potent blood pressure medications.   2. Essential hypertension Swelling is much worse since stopping chlorthalidone, but she thinks it made her dizzy. Start hydrochlorothiazide (HYDRODIURIL) 25 MG tablet; Take 1 tablet (25 mg total) by mouth daily.  Dispense: 30 tablet; Refill: 1   3. Edema Start thiazide as above.   4. Hyperuricemia She stopped allopurinol since it made her feel so weak she couldn't move.   5. Gout She is unlikely to tolerate any uric acid lowering medications. Will continue to treat prn with  diclofenac (CATAFLAM) 50 MG tablet; TAKE 1 TABLET (50 MG TOTAL) BY MOUTH 2 (TWO) TIMES DAILY AS NEEDED (FOOT PAIN).  Dispense: 30 tablet; Refill: 2  3. Need for influenza vaccination  - Flu Vaccine QUAD High Dose(Fluad)   Follow up 2 monthss     The entirety of the information documented in the History of Present Illness, Review of Systems and Physical Exam were personally obtained by me. Portions of this information were initially documented by the CMA and reviewed by me for thoroughness and accuracy.      Lelon Huh, MD  [facility] [phone] (phone) [phone] (fax)  [place]

## 2020-05-27 ENCOUNTER — Other Ambulatory Visit: Payer: Self-pay

## 2020-05-27 ENCOUNTER — Ambulatory Visit: Payer: PPO | Attending: Family Medicine

## 2020-05-27 DIAGNOSIS — R2689 Other abnormalities of gait and mobility: Secondary | ICD-10-CM | POA: Diagnosis not present

## 2020-05-27 DIAGNOSIS — M6281 Muscle weakness (generalized): Secondary | ICD-10-CM | POA: Insufficient documentation

## 2020-05-27 NOTE — Therapy (Signed)
[facility] [address] [place], [address] Phone: [phone]   Fax:  [phone]  Physical Therapy Evaluation  Patient Details  Name: Kayla Chan MRN: [id number] Date of Birth: Jun 30, 1932 Referring Provider (PT): Lelon Huh, MD    Encounter Date: 05/27/2020   PT End of Session - 05/27/20 1034    Visit Number 1    Number of Visits 17    Date for PT Re-Evaluation 07/22/20    PT Start Time 9211    PT Stop Time 1115    PT Time Calculation (min) 60 min    Equipment Utilized During Treatment Gait belt    Activity Tolerance Patient tolerated treatment well;No increased pain    Behavior During Therapy WFL for tasks assessed/performed           Past Medical History:  Diagnosis Date  . Cancer ([place])    BCC on nose  . Hypertension     Past Surgical History:  Procedure Laterality Date  . 24 hour Holter Monitor  01/2013   4550 ectopic beats with 344 superventricular bigemminy events  . Abdominal Ultrasound  05/31/2013   RUQ. Fatty Liver  . APPENDECTOMY  1950   [place]  . 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, [place]; Right    There were no vitals filed for this visit.    Subjective Assessment - 05/27/20 1022    Subjective Patient is an 86 y/o female with a diagnosis of LE weakness and gait with a c/c of altered balance and gait due to weakness in her legs. Patient states that she has trouble with household and community ambulation and uses a walker for in home and a [facility] for community.  Patient reports that she also has difficulty with standing for long periods of time along with ascending/descending stairs.  Patient states that her recent medication changes for her BP have led to more episodes of dizziness and reports that she had fallen in her bedroom around 6 months ago.  Patient had therapy for these issues prior, however, stopped going due to the intensity of  exercises placed on her too soon which led to severe DOMS. Patient goal of therapy is to have better balance and to be able to walk better and longer.    Pertinent History 86 yo Female reports chronic knee pain (L>R) and weakness in both legs. She has had TKR (5-10 years ago each leg); Patient is hard of hearing; She presents to [facility] with [facility] and states that she has been using it for last 3-4 years; She reports weakness off/on for last few years. Patient has numbness in left knee; She denies any numbness anywhere else; She denies any recent falls; She reports that when inside the house she does not use the cane, but will hold onto furniture;     Limitations Walking;Lifting;Standing    How long can you sit comfortably? unlimited    How long can you stand comfortably? ~10 minutes    How long can you walk comfortably? ~20 minutes    Patient Stated Goals Have better balance and be able to walk better.    Currently in Pain? No/denies    Multiple Pain Sites No              [facility] PT Assessment - 05/27/20 0001      Assessment   Medical Diagnosis Weakness in BLE, Abnormal Gait     Referring Provider (PT) Lelon Huh,  MD     Hand Dominance Right    Next MD Visit unknown    Prior Therapy yes      Precautions   Precautions Fall      Restrictions   Weight Bearing Restrictions No      Balance Screen   Has the patient fallen in the past 6 months Yes    How many times? 1    Has the patient had a decrease in activity level because of a fear of falling?  Yes    Is the patient reluctant to leave their home because of a fear of falling?  No      Home Environment   Living Environment Private residence    Additional Comments 3 stairs to enter back porch       Prior Function   Level of Independence Independent;Requires assistive device for independence    Vocation Retired    Catering manager, Use cart to walk around so that she does not fatigue as quickly       Cognition   Overall  Cognitive Status Within Functional Limits for tasks assessed      Sensation   Additional Comments Reports numbness after Surgeries that remains      Functional Tests   Functional tests Sit to Stand;Other      Sit to Stand   Comments 16 sec from [place] (16.5 inches)      Other:   Other/ Comments Stairs: Blat Handrails with step to pattern       ROM / Strength   AROM / PROM / Strength Strength      AROM   Overall AROM Comments WFL       Strength   Right Hip Flexion 4-/5    Right Hip Extension 4-/5    Right Hip ABduction 3+/5   Assessed in sitting due to reluctance of lying down/on side   Right Hip ADduction 4-/5   Assessed in sitting due to reluctance of lying down/on side   Left Hip Flexion 4-/5    Left Hip Extension 4-/5    Left Hip ABduction 3+/5   Assessed in sitting due to reluctance of lying down/on side   Left Hip ADduction 4-/5   Assessed in sitting due to reluctance of lying down/on side   Right Knee Flexion 4/5    Right Knee Extension 4/5    Left Knee Flexion 4/5    Left Knee Extension 4/5    Right Ankle Dorsiflexion 4-/5    Left Ankle Dorsiflexion 4-/5      Transfers   Five time sit to stand comments  --      Ambulation/Gait   Ambulation/Gait Yes    Ambulation/Gait Assistance 6: Modified independent (Device/Increase time)    Stair Management Technique Two rails;Step to pattern      Balance   Balance Assessed Yes      Standardized Balance Assessment   Standardized Balance Assessment Dynamic Gait Index;Timed Up and Go Test;10 meter walk test    Five times sit to stand comments  16 sec Green Chair (16.5 inches)    10 Meter Walk 0.47 m/sec with SPC       Dynamic Gait Index   Level Surface Mild Impairment    Change in Gait Speed Moderate Impairment    Gait with Horizontal Head Turns Moderate Impairment    Gait with Vertical Head Turns Moderate Impairment    Gait and Pivot Turn Moderate Impairment    Step Over Obstacle Severe  Impairment    Step  Around Obstacles Moderate Impairment    Steps Moderate Impairment    Total Score 8    DGI comment: with SPC       Timed Up and Go Test   Normal TUG (seconds) 29    TUG Comments with SPC       Functional Gait  Assessment   Gait assessed  No            Therapeutic Exercise (HEP) - Standing Hip Abduction x10  - Standing Hip Extension x10  - STS x10           Objective measurements completed on examination: See above findings.               PT Education - 05/27/20 1033    Education provided Yes    Education Details Educated patient on HEP and POC    Person(s) Educated Patient;Spouse    Methods Explanation;Demonstration    Comprehension Verbalized understanding;Returned demonstration            PT Short Term Goals - 05/27/20 1110      PT SHORT TERM GOAL #1   Title Patient will be adherent to HEP at least 3x a week to improve functional strength and balance for better safety at home.    Time 4    Period Weeks    Status New    Target Date 06/24/20             PT Long Term Goals - 05/27/20 1111      PT LONG TERM GOAL #1   Title Patient will be independent in home exercise program to improve strength/mobility for better functional independence with ADLs.    Time 8    Period Weeks    Status New    Target Date 07/22/20      PT LONG TERM GOAL #2   Title Patient will increase 10 meter walk test to >1.69m/s as to improve gait speed for better community ambulation and to reduce fall risk.    Baseline 05/27/20: 0.47 m/sec    Time 8    Period Weeks    Status New    Target Date 07/22/20      PT LONG TERM GOAL #3   Title Patient will imporve DGI score by at least 3 points to show an improvement in gait, balance, and falls risks for community ambulation.    Baseline 05/27/20: 8/24    Time 8    Period Weeks    Status New    Target Date 07/22/20      PT LONG TERM GOAL #4   Title Patient will imrpove her TUG time to 20Sec or less to show  improvement in mobility, balance, gait, and falls risks for household and community ambulation.    Baseline 05/27/20: 29 sec    Time 8    Period Weeks    Status New    Target Date 07/22/20      PT LONG TERM GOAL #5   Title Patient will increase his FOTO score  show an improvement in his ability to perform functional activities.    Baseline 05/27/20:    Time 8    Period Weeks    Status New    Target Date 07/22/20                  Plan - 05/27/20 1121    Clinical Impression Statement Patient is an 86 y/o female with a diagnosis  of LE weakness and gait with a c/c of altered balance and gait due to weakness in her legs. Upon evaluation, both balance and strength on LE's are moderately limited and need to be addressed to improve patient's confidence of household and community ambulation.  Patients MMT demonstrate moderate weakness, with her hips being more limited in strength.  Patients results of MMT, DGI, 5xSTS, 58mWT, and TUG all indicate a need to improve her mobility, gait, balance, strength, and endurance to improve ambulation and decrease falls risk.  Patient will benefit from skilled therapy to address deficits, progress towards goals and return to prior level of function. Will test 6MWT and FOTO outcome at next session.    Personal Factors and Comorbidities Age;Comorbidity 2    Comorbidities HBP, Hard of Hearing, Prior Bilat TKAs    Examination-Activity Limitations Locomotion Level;Stairs;Stand;Lift;Squat    Examination-Participation Restrictions Yard Work;Community Activity;Shop    Stability/Clinical Decision Making Stable/Uncomplicated    Clinical Decision Making Moderate    Clinical Presentation due to: LE weakness and balance issues    Rehab Potential Good    Clinical Impairments Affecting Rehab Potential motivated, has good caregiver support;     PT Frequency 2x / week    PT Duration 8 weeks    PT Treatment/Interventions ADLs/Self Care Home Management;Aquatic  Therapy;Cryotherapy;Electrical Stimulation;Moist Heat;Therapeutic exercise;Therapeutic activities;Functional mobility training;Stair training;Gait training;Balance training;Neuromuscular re-education;Patient/family education;Taping;Energy conservation;Dry needling;Passive range of motion;Joint Manipulations;Manual techniques    PT Next Visit Plan FOTO, 6MWT, Review HEP    PT Home Exercise Plan STS, Standing Hip Abduction and Extension    Consulted and Agree with Plan of Care Patient;Family member/caregiver    Family Member Consulted Husband           Patient will benefit from skilled therapeutic intervention in order to improve the following deficits and impairments:  Abnormal gait, Decreased balance, Decreased endurance, Difficulty walking, Impaired sensation, Improper body mechanics, Obesity, Impaired perceived functional ability, Decreased range of motion, Decreased activity tolerance, Decreased safety awareness, Decreased strength, Postural dysfunction  Visit Diagnosis: Muscle weakness (generalized)  Other abnormalities of gait and mobility     Problem List Patient Active Problem List   Diagnosis Date Noted  . Bilateral impacted cerumen 01/25/2020  . Non-recurrent acute serous otitis media of right ear 01/25/2020  . Otitis externa 01/25/2020  . Hearing aid worn 01/25/2020  . At risk for polypharmacy 01/10/2020  . Dizzinesses 01/10/2020  . Decreased GFR 01/10/2020  . Morbid obesity ([place]) 11/23/2018  . Chronic kidney disease, stage 3 (moderate) 11/09/2018  . Limited mobility 09/03/2017  . Fatigue 02/04/2016  . Palpitations 02/04/2016  . Gout 09/24/2015  . Hyperuricemia 08/20/2015  . Back pain 06/28/2015  . Constipation 06/28/2015  . Eczema 06/28/2015  . Edema 06/28/2015  . Pre-diabetes 06/28/2015  . Intertrigo 06/28/2015  . PAC (premature atrial contraction) 06/28/2015  . Premature heartbeats 06/28/2015  . History of basal cell cancer 02/18/2015  . Fatty infiltration  of liver 05/31/2013  . Hypothyroidism 01/15/2011  . Arthropathy of pelvic region and thigh 08/01/2008  . HLD (hyperlipidemia) 03/13/2008  . Arthritis, degenerative 01/26/2007  . Hypertension 12/13/2006  . Personal history of venous thrombosis and embolism 05/24/2006   1:25 PM, 05/27/20 Margarito Liner, SPT Student Physical Therapist [place]  [phone]  Margarito Liner 05/27/2020, 1:24 PM  [facility] [address] [place], [address] Phone: [phone]   Fax:  [phone]  Name: Kayla Chan MRN: [id number] Date of Birth: Apr 27, 1932

## 2020-05-28 ENCOUNTER — Other Ambulatory Visit: Payer: Self-pay | Admitting: Family Medicine

## 2020-05-29 ENCOUNTER — Ambulatory Visit: Payer: PPO

## 2020-05-29 ENCOUNTER — Other Ambulatory Visit: Payer: Self-pay

## 2020-05-29 DIAGNOSIS — M6281 Muscle weakness (generalized): Secondary | ICD-10-CM | POA: Diagnosis not present

## 2020-05-29 DIAGNOSIS — R2689 Other abnormalities of gait and mobility: Secondary | ICD-10-CM

## 2020-05-29 NOTE — Therapy (Signed)
[facility] [address] [place], [address] Phone: [phone]   Fax:  [phone]  Physical Therapy Treatment  Patient Details  Name: Kayla Chan MRN: [id number] Date of Birth: 1932-08-04 Referring Provider (PT): Lelon Huh, MD    Encounter Date: 05/29/2020   PT End of Session - 05/29/20 0950    Visit Number 2    Number of Visits 17    Date for PT Re-Evaluation 07/22/20    PT Start Time 0945    PT Stop Time 1025    PT Time Calculation (min) 40 min    Equipment Utilized During Treatment Gait belt    Activity Tolerance Patient tolerated treatment well;No increased pain    Behavior During Therapy WFL for tasks assessed/performed           Past Medical History:  Diagnosis Date  . Cancer ([place])    BCC on nose  . Hypertension     Past Surgical History:  Procedure Laterality Date  . 24 hour Holter Monitor  01/2013   4550 ectopic beats with 344 superventricular bigemminy events  . Abdominal Ultrasound  05/31/2013   RUQ. Fatty Liver  . APPENDECTOMY  1950   [place]  . 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, [place]; Right    There were no vitals filed for this visit.   Subjective Assessment - 05/29/20 0948    Subjective Patient states that she feels shaky this morning due to medication but she has been performing HEP.    Pertinent History 86 yo Female reports chronic knee pain (L>R) and weakness in both legs. She has had TKR (5-10 years ago each leg); Patient is hard of hearing; She presents to therapy with [facility] and states that she has been using it for last 3-4 years; She reports weakness off/on for last few years. Patient has numbness in left knee; She denies any numbness anywhere else; She denies any recent falls; She reports that when inside the house she does not use the cane, but will hold onto furniture;     Limitations Walking;Lifting;Standing    How long can  you sit comfortably? unlimited    How long can you stand comfortably? ~10 minutes    How long can you walk comfortably? ~20 minutes    Diagnostic tests Reports recent x-rays- states that left ankle showed a spur; everything looked okay in the knees- MD states that she did not need surgery per patient;     Patient Stated Goals Have better balance and be able to walk better.    Currently in Pain? No/denies           Interventions  Therapeutic Exercise -NuStep x5 minutes to help improve aerobic capacity/endurance (Seat 7/ Handle 7) -6MWT perform: 352.49ft  -STS 2x10 -Standing Hip Abduction x10 B -Standing Hip Extension x10 B  -Standing Hip Flexion x10 B  -Lateral Walk x10 B  Performed Exercise to improve patients LE strength and endurance      PT Education - 05/29/20 0949    Education provided Yes    Education Details Form/Technique for Exercise    Person(s) Educated Patient    Methods Explanation;Demonstration    Comprehension Verbalized understanding;Returned demonstration            PT Short Term Goals - 05/27/20 1110      PT SHORT TERM GOAL #1   Title Patient will be adherent to HEP at least 3x a week to  improve functional strength and balance for better safety at home.    Time 4    Period Weeks    Status New    Target Date 06/24/20             PT Long Term Goals - 05/29/20 1040      PT LONG TERM GOAL #1   Title Patient will be independent in home exercise program to improve strength/mobility for better functional independence with ADLs.    Time 8    Period Weeks    Status New      PT LONG TERM GOAL #2   Title Patient will increase 10 meter walk test to >1.79m/s as to improve gait speed for better community ambulation and to reduce fall risk.    Baseline 05/27/20: 0.47 m/sec    Time 8    Period Weeks    Status New      PT LONG TERM GOAL #3   Title Patient will imporve DGI score by at least 3 points to show an improvement in gait, balance, and falls  risks for community ambulation.    Baseline 05/27/20: 8/24    Time 8    Period Weeks    Status New      PT LONG TERM GOAL #4   Title Patient will imrpove her TUG time to 20Sec or less to show improvement in mobility, balance, gait, and falls risks for household and community ambulation.    Baseline 05/27/20: 29 sec    Time 8    Period Weeks    Status New      PT LONG TERM GOAL #5   Title Patient will increase his FOTO score to 61 to  show an improvement in his ability to perform functional activities.    Baseline 05/27/20: 56    Time 8    Period Weeks    Status New      Additional Long Term Goals   Additional Long Term Goals Yes      PT LONG TERM GOAL #6   Title Patient will improve 6MWT by 164 ft (88m) to show improvement in aerobic capacity and LE strength/endurance .    Baseline 05/29/20: 352.24ft (107.67m)    Time 8    Period Weeks    Status New    Target Date 07/24/20                 Plan - 05/29/20 1039    Clinical Impression Statement Reviewed HEP at which patient showed good compliance with.  Began working on improving patient's aerobic capacity and LE strength and endurance, Performed 6MWT during session and patient was only able to walk 352.54ft with 4 standing break during test showing very poor aerobic capacity/endurance.  Patient required verbal cues for proper technique when performing exercises for greater muscular recruitment.  Patient will benefit from skilled therapy to return to prior level of function.    Personal Factors and Comorbidities Age;Comorbidity 2    Comorbidities HBP, Hard of Hearing, Prior Bilat TKAs    Examination-Activity Limitations Locomotion Level;Stairs;Stand;Lift;Squat    Examination-Participation Restrictions Yard Work;Community Activity;Shop    Stability/Clinical Decision Making Stable/Uncomplicated    Clinical Decision Making Moderate    Rehab Potential Good    Clinical Impairments Affecting Rehab Potential motivated, has good  caregiver support;     PT Frequency 2x / week    PT Duration 8 weeks    PT Treatment/Interventions ADLs/Self Care Home Management;Aquatic Therapy;Cryotherapy;Electrical Stimulation;Moist Heat;Therapeutic exercise;Therapeutic activities;Functional mobility  training;Stair training;Gait training;Balance training;Neuromuscular re-education;Patient/family education;Taping;Energy conservation;Dry needling;Passive range of motion;Joint Manipulations;Manual techniques    PT Next Visit Plan LE Strength and overall endurance    PT Home Exercise Plan STS, Standing Hip Abduction and Extension    Consulted and Agree with Plan of Care Patient;Family member/caregiver    Family Member Consulted Husband           Patient will benefit from skilled therapeutic intervention in order to improve the following deficits and impairments:  Abnormal gait, Decreased balance, Decreased endurance, Difficulty walking, Impaired sensation, Improper body mechanics, Obesity, Impaired perceived functional ability, Decreased range of motion, Decreased activity tolerance, Decreased safety awareness, Decreased strength, Postural dysfunction  Visit Diagnosis: Muscle weakness (generalized)  Other abnormalities of gait and mobility     Problem List Patient Active Problem List   Diagnosis Date Noted  . Bilateral impacted cerumen 01/25/2020  . Non-recurrent acute serous otitis media of right ear 01/25/2020  . Otitis externa 01/25/2020  . Hearing aid worn 01/25/2020  . At risk for polypharmacy 01/10/2020  . Dizzinesses 01/10/2020  . Decreased GFR 01/10/2020  . Morbid obesity ([place]) 11/23/2018  . Chronic kidney disease, stage 3 (moderate) 11/09/2018  . Limited mobility 09/03/2017  . Fatigue 02/04/2016  . Palpitations 02/04/2016  . Gout 09/24/2015  . Hyperuricemia 08/20/2015  . Back pain 06/28/2015  . Constipation 06/28/2015  . Eczema 06/28/2015  . Edema 06/28/2015  . Pre-diabetes 06/28/2015  . Intertrigo 06/28/2015   . PAC (premature atrial contraction) 06/28/2015  . Premature heartbeats 06/28/2015  . History of basal cell cancer 02/18/2015  . Fatty infiltration of liver 05/31/2013  . Hypothyroidism 01/15/2011  . Arthropathy of pelvic region and thigh 08/01/2008  . HLD (hyperlipidemia) 03/13/2008  . Arthritis, degenerative 01/26/2007  . Hypertension 12/13/2006  . Personal history of venous thrombosis and embolism 05/24/2006   10:44 AM, 05/29/20 Margarito Liner, SPT Student Physical Therapist [place]  [phone]  Margarito Liner 05/29/2020, 10:43 AM  [facility] [address] [place], [address] Phone: [phone]   Fax:  [phone]  Name: Nguyet Mercer MRN: [id number] Date of Birth: December 13, 1931

## 2020-06-03 ENCOUNTER — Other Ambulatory Visit: Payer: Self-pay

## 2020-06-03 ENCOUNTER — Ambulatory Visit: Payer: PPO

## 2020-06-03 DIAGNOSIS — R2689 Other abnormalities of gait and mobility: Secondary | ICD-10-CM

## 2020-06-03 DIAGNOSIS — M6281 Muscle weakness (generalized): Secondary | ICD-10-CM

## 2020-06-03 NOTE — Therapy (Signed)
[facility] [address], [place], [address] Phone: [phone]   Fax:  [phone]  Physical Therapy Treatment  Patient Details  Name: Kayla Chan MRN: [id number] Date of Birth: [birth year] Referring Provider (PT): Lelon Huh, MD    Encounter Date: 06/03/2020   PT End of Session - 06/03/20 0949    Visit Number 3    Number of Visits 17    Date for PT Re-Evaluation 07/22/20    PT Start Time 0945    PT Stop Time 1030    PT Time Calculation (min) 45 min    Equipment Utilized During Treatment Gait belt    Activity Tolerance Patient tolerated treatment well;No increased pain    Behavior During Therapy WFL for tasks assessed/performed           Past Medical History:  Diagnosis Date  . Cancer ([place])    BCC on nose  . Hypertension     Past Surgical History:  Procedure Laterality Date  . 24 hour Holter Monitor  01/2013   4550 ectopic beats with 344 superventricular bigemminy events  . Abdominal Ultrasound  05/31/2013   RUQ. Fatty Liver  . APPENDECTOMY  1950   [place]  . 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, [place]; Right    There were no vitals filed for this visit.   Subjective Assessment - 06/03/20 0945    Subjective Patient reports that she had a flair up of gout that limited her from performing HEP.    Pertinent History 86 yo Female reports chronic knee pain (L>R) and weakness in both legs. She has had TKR (5-10 years ago each leg); Patient is hard of hearing; She presents to therapy with [facility] and states that she has been using it for last 3-4 years; She reports weakness off/on for last few years. Patient has numbness in left knee; She denies any numbness anywhere else; She denies any recent falls; She reports that when inside the house she does not use the cane, but will hold onto furniture;     Limitations Walking;Lifting;Standing    How long can you sit  comfortably? unlimited    How long can you stand comfortably? ~10 minutes    How long can you walk comfortably? ~20 minutes    Diagnostic tests Reports recent x-rays- states that left ankle showed a spur; everything looked okay in the knees- MD states that she did not need surgery per patient;     Patient Stated Goals Have better balance and be able to walk better.    Currently in Pain? No/denies          Interventions   Therapeutic Exercise -NuStep x8 minutes to help improve aerobic capacity/endurance LEVEL 2 (Seat 7/ Handle 7) -STS 2x10 -Ambulation with SPC x247ft  -Standing Hip Abduction x10 B -Standing Hip Extension x10 B  -Standing Hip Flexion x10 B  -Lateral Walk x10 B   Performed Exercise to improve patients LE strength and endurance     PT Education - 06/03/20 0949    Education provided Yes    Education Details Form/Technique for Exercise    Person(s) Educated Patient    Methods Explanation;Demonstration    Comprehension Verbalized understanding;Returned demonstration            PT Short Term Goals - 05/27/20 1110      PT SHORT TERM GOAL #1   Title Patient will be adherent to HEP at least 3x a  week to improve functional strength and balance for better safety at home.    Time 4    Period Weeks    Status New    Target Date 06/24/20             PT Long Term Goals - 05/29/20 1040      PT LONG TERM GOAL #1   Title Patient will be independent in home exercise program to improve strength/mobility for better functional independence with ADLs.    Time 8    Period Weeks    Status New      PT LONG TERM GOAL #2   Title Patient will increase 10 meter walk test to >1.52m/s as to improve gait speed for better community ambulation and to reduce fall risk.    Baseline 05/27/20: 0.47 m/sec    Time 8    Period Weeks    Status New      PT LONG TERM GOAL #3   Title Patient will imporve DGI score by at least 3 points to show an improvement in gait, balance, and falls  risks for community ambulation.    Baseline 05/27/20: 8/24    Time 8    Period Weeks    Status New      PT LONG TERM GOAL #4   Title Patient will imrpove her TUG time to 20Sec or less to show improvement in mobility, balance, gait, and falls risks for household and community ambulation.    Baseline 05/27/20: 29 sec    Time 8    Period Weeks    Status New      PT LONG TERM GOAL #5   Title Patient will increase his FOTO score to 61 to  show an improvement in his ability to perform functional activities.    Baseline 05/27/20: 56    Time 8    Period Weeks    Status New      Additional Long Term Goals   Additional Long Term Goals Yes      PT LONG TERM GOAL #6   Title Patient will improve 6MWT by 164 ft (72m) to show improvement in aerobic capacity and LE strength/endurance .    Baseline 05/29/20: 352.23ft (107.34m)    Time 8    Period Weeks    Status New    Target Date 07/24/20                 Plan - 06/03/20 0950    Clinical Impression Statement Patient's strength and overall endurance remains limited which is demonstrated by many episodes of SOB and rest breaks required during session.  Patient tolerated session well with moderate fatigue at end of session.  Patient requires verbal cueing during entirety of session to perform exercise with correct technique.  Patient will continue to benefit from skilled therapy to return to prior level of function.    Personal Factors and Comorbidities Age;Comorbidity 2    Comorbidities HBP, Hard of Hearing, Prior Bilat TKAs    Examination-Activity Limitations Locomotion Level;Stairs;Stand;Lift;Squat    Examination-Participation Restrictions Yard Work;Community Activity;Shop    Stability/Clinical Decision Making Stable/Uncomplicated    Clinical Decision Making Moderate    Rehab Potential Good    Clinical Impairments Affecting Rehab Potential motivated, has good caregiver support;     PT Frequency 2x / week    PT Duration 8 weeks    PT  Treatment/Interventions ADLs/Self Care Home Management;Aquatic Therapy;Cryotherapy;Electrical Stimulation;Moist Heat;Therapeutic exercise;Therapeutic activities;Functional mobility training;Stair training;Gait training;Balance training;Neuromuscular re-education;Patient/family education;Taping;Energy conservation;Dry needling;Passive  range of motion;Joint Manipulations;Manual techniques    PT Next Visit Plan LE Strength and overall endurance    PT Home Exercise Plan STS, Standing Hip Abduction and Extension    Consulted and Agree with Plan of Care Patient;Family member/caregiver    Family Member Consulted --           Patient will benefit from skilled therapeutic intervention in order to improve the following deficits and impairments:  Abnormal gait, Decreased balance, Decreased endurance, Difficulty walking, Impaired sensation, Improper body mechanics, Obesity, Impaired perceived functional ability, Decreased range of motion, Decreased activity tolerance, Decreased safety awareness, Decreased strength, Postural dysfunction  Visit Diagnosis: Muscle weakness (generalized)  Other abnormalities of gait and mobility     Problem List Patient Active Problem List   Diagnosis Date Noted  . Bilateral impacted cerumen 01/25/2020  . Non-recurrent acute serous otitis media of right ear 01/25/2020  . Otitis externa 01/25/2020  . Hearing aid worn 01/25/2020  . At risk for polypharmacy 01/10/2020  . Dizzinesses 01/10/2020  . Decreased GFR 01/10/2020  . Morbid obesity ([place]) 11/23/2018  . Chronic kidney disease, stage 3 (moderate) 11/09/2018  . Limited mobility 09/03/2017  . Fatigue 02/04/2016  . Palpitations 02/04/2016  . Gout 09/24/2015  . Hyperuricemia 08/20/2015  . Back pain 06/28/2015  . Constipation 06/28/2015  . Eczema 06/28/2015  . Edema 06/28/2015  . Pre-diabetes 06/28/2015  . Intertrigo 06/28/2015  . PAC (premature atrial contraction) 06/28/2015  . Premature heartbeats 06/28/2015   . History of basal cell cancer 02/18/2015  . Fatty infiltration of liver 05/31/2013  . Hypothyroidism 01/15/2011  . Arthropathy of pelvic region and thigh 08/01/2008  . HLD (hyperlipidemia) 03/13/2008  . Arthritis, degenerative 01/26/2007  . Hypertension 12/13/2006  . Personal history of venous thrombosis and embolism 05/24/2006   10:29 AM, 06/03/20 Margarito Liner, SPT Student Physical Therapist [place]  [phone]  Margarito Liner 06/03/2020, 10:27 AM  [facility] [address], [place], [address] Phone: [phone]   Fax:  [phone]  Name: Quiara Killian MRN: [id number] Date of Birth: May 22, 1932

## 2020-06-05 ENCOUNTER — Ambulatory Visit: Payer: PPO

## 2020-06-05 ENCOUNTER — Telehealth: Payer: Self-pay

## 2020-06-05 ENCOUNTER — Ambulatory Visit (INDEPENDENT_AMBULATORY_CARE_PROVIDER_SITE_OTHER): Payer: PPO

## 2020-06-05 ENCOUNTER — Other Ambulatory Visit: Payer: Self-pay

## 2020-06-05 DIAGNOSIS — M6281 Muscle weakness (generalized): Secondary | ICD-10-CM | POA: Diagnosis not present

## 2020-06-05 DIAGNOSIS — Z23 Encounter for immunization: Secondary | ICD-10-CM

## 2020-06-05 DIAGNOSIS — R2689 Other abnormalities of gait and mobility: Secondary | ICD-10-CM

## 2020-06-05 NOTE — Telephone Encounter (Signed)
Patient was in the office to get Covid Vaccine.   She wanted to relay a message that she is very dizzy since being on HCTZ.

## 2020-06-05 NOTE — Therapy (Signed)
[facility] [address] [place], [address] Phone: [phone]   Fax:  [phone]  Physical Therapy Treatment  Patient Details  Name: Kayla Chan MRN: [id number] Date of Birth: 07/16/1932 Referring Provider (PT): Lelon Huh, MD    Encounter Date: 06/05/2020   PT End of Session - 06/05/20 1309    Visit Number 4    Number of Visits 17    Date for PT Re-Evaluation 07/22/20    PT Start Time 1300    PT Stop Time 1345    PT Time Calculation (min) 45 min    Equipment Utilized During Treatment Gait belt    Activity Tolerance Patient tolerated treatment well;No increased pain    Behavior During Therapy WFL for tasks assessed/performed           Past Medical History:  Diagnosis Date  . Cancer ([place])    BCC on nose  . Hypertension     Past Surgical History:  Procedure Laterality Date  . 24 hour Holter Monitor  01/2013   4550 ectopic beats with 344 superventricular bigemminy events  . Abdominal Ultrasound  05/31/2013   RUQ. Fatty Liver  . APPENDECTOMY  1950   [place]  . 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, [place]; Right    There were no vitals filed for this visit.   Subjective Assessment - 06/05/20 1308    Subjective Patient states that's she doing ok but has been dizzy most if the morning.    Pertinent History 86 yo Female reports chronic knee pain (L>R) and weakness in both legs. She has had TKR (5-10 years ago each leg); Patient is hard of hearing; She presents to therapy with [facility] and states that she has been using it for last 3-4 years; She reports weakness off/on for last few years. Patient has numbness in left knee; She denies any numbness anywhere else; She denies any recent falls; She reports that when inside the house she does not use the cane, but will hold onto furniture;     Limitations Walking;Lifting;Standing    How long can you sit comfortably?  unlimited    How long can you stand comfortably? ~10 minutes    How long can you walk comfortably? ~20 minutes    Diagnostic tests Reports recent x-rays- states that left ankle showed a spur; everything looked okay in the knees- MD states that she did not need surgery per patient;     Patient Stated Goals Have better balance and be able to walk better.    Currently in Pain? No/denies           Interventions   Therapeutic Exercise -NuStep x8 minutes to help improve aerobic capacity/endurance LEVEL 2 (Seat 7/ Handle 7) -STS 2x10 -Ambulation with SPC x337ft  (3x160ft bouts)  -Stairs (ascending/descending) x2 (step to pattern with bilat rail)  -Standing Hip Abduction x12 B -Standing Hip Extension x10 B  -Standing Toe Taps (8inch) x8 B  -Seated Marching x15 B    Performed Exercise to improve patients LE strength and endurance       PT Education - 06/05/20 1309    Education provided Yes    Education Details Form/Technique fo Exercises    Person(s) Educated Patient    Methods Explanation;Demonstration    Comprehension Verbalized understanding;Returned demonstration            PT Short Term Goals - 05/27/20 1110      PT SHORT  TERM GOAL #1   Title Patient will be adherent to HEP at least 3x a week to improve functional strength and balance for better safety at home.    Time 4    Period Weeks    Status New    Target Date 06/24/20             PT Long Term Goals - 05/29/20 1040      PT LONG TERM GOAL #1   Title Patient will be independent in home exercise program to improve strength/mobility for better functional independence with ADLs.    Time 8    Period Weeks    Status New      PT LONG TERM GOAL #2   Title Patient will increase 10 meter walk test to >1.2m/s as to improve gait speed for better community ambulation and to reduce fall risk.    Baseline 05/27/20: 0.47 m/sec    Time 8    Period Weeks    Status New      PT LONG TERM GOAL #3   Title Patient will  imporve DGI score by at least 3 points to show an improvement in gait, balance, and falls risks for community ambulation.    Baseline 05/27/20: 8/24    Time 8    Period Weeks    Status New      PT LONG TERM GOAL #4   Title Patient will imrpove her TUG time to 20Sec or less to show improvement in mobility, balance, gait, and falls risks for household and community ambulation.    Baseline 05/27/20: 29 sec    Time 8    Period Weeks    Status New      PT LONG TERM GOAL #5   Title Patient will increase his FOTO score to 61 to  show an improvement in his ability to perform functional activities.    Baseline 05/27/20: 56    Time 8    Period Weeks    Status New      Additional Long Term Goals   Additional Long Term Goals Yes      PT LONG TERM GOAL #6   Title Patient will improve 6MWT by 164 ft (101m) to show improvement in aerobic capacity and LE strength/endurance .    Baseline 05/29/20: 352.32ft (107.32m)    Time 8    Period Weeks    Status New    Target Date 07/24/20                 Plan - 06/05/20 1357    Clinical Impression Statement Continued to address patient's strength and overall endurance during session.  Patient had a few episodes of SOB during mild exertion exercises and ambulation that require around ~54minute to recover.  Patient is making progress demonstrated by her ability to walk a further distance (149ft) before needing to take a rest break. Patient tolerated exercises well with no pain or symptoms developing.  Patient will continue to benefit from skilled therapy to return to prior level of function.    Personal Factors and Comorbidities Age;Comorbidity 2;Fitness    Comorbidities HBP, Hard of Hearing, Prior Bilat TKAs    Examination-Activity Limitations Locomotion Level;Stairs;Stand;Lift;Squat    Examination-Participation Restrictions Yard Work;Community Activity;Shop    Stability/Clinical Decision Making Stable/Uncomplicated    Clinical Decision Making Moderate     Rehab Potential Good    Clinical Impairments Affecting Rehab Potential motivated, has good caregiver support;     PT Frequency 2x / week  PT Duration 8 weeks    PT Treatment/Interventions ADLs/Self Care Home Management;Aquatic Therapy;Cryotherapy;Electrical Stimulation;Moist Heat;Therapeutic exercise;Therapeutic activities;Functional mobility training;Stair training;Gait training;Balance training;Neuromuscular re-education;Patient/family education;Taping;Energy conservation;Dry needling;Passive range of motion;Joint Manipulations;Manual techniques    PT Next Visit Plan LE Strength and overall endurance    PT Home Exercise Plan STS, Standing Hip Abduction and Extension    Consulted and Agree with Plan of Care Patient;Family member/caregiver           Patient will benefit from skilled therapeutic intervention in order to improve the following deficits and impairments:  Abnormal gait, Decreased balance, Decreased endurance, Difficulty walking, Impaired sensation, Improper body mechanics, Obesity, Impaired perceived functional ability, Decreased range of motion, Decreased activity tolerance, Decreased safety awareness, Decreased strength, Postural dysfunction  Visit Diagnosis: Muscle weakness (generalized)  Other abnormalities of gait and mobility     Problem List Patient Active Problem List   Diagnosis Date Noted  . Bilateral impacted cerumen 01/25/2020  . Non-recurrent acute serous otitis media of right ear 01/25/2020  . Otitis externa 01/25/2020  . Hearing aid worn 01/25/2020  . At risk for polypharmacy 01/10/2020  . Dizzinesses 01/10/2020  . Decreased GFR 01/10/2020  . Morbid obesity ([place]) 11/23/2018  . Chronic kidney disease, stage 3 (moderate) 11/09/2018  . Limited mobility 09/03/2017  . Fatigue 02/04/2016  . Palpitations 02/04/2016  . Gout 09/24/2015  . Hyperuricemia 08/20/2015  . Back pain 06/28/2015  . Constipation 06/28/2015  . Eczema 06/28/2015  . Edema  06/28/2015  . Pre-diabetes 06/28/2015  . Intertrigo 06/28/2015  . PAC (premature atrial contraction) 06/28/2015  . Premature heartbeats 06/28/2015  . History of basal cell cancer 02/18/2015  . Fatty infiltration of liver 05/31/2013  . Hypothyroidism 01/15/2011  . Arthropathy of pelvic region and thigh 08/01/2008  . HLD (hyperlipidemia) 03/13/2008  . Arthritis, degenerative 01/26/2007  . Hypertension 12/13/2006  . Personal history of venous thrombosis and embolism 05/24/2006   1:59 PM, 06/05/20 Margarito Liner, SPT Student Physical Therapist [place]  [phone]  Margarito Liner 06/05/2020, 1:59 PM  [facility] [address] [place], [address] Phone: [phone]   Fax:  [phone]  Name: Ravinder Hofland MRN: [id number] Date of Birth: [birth year]

## 2020-06-07 NOTE — Telephone Encounter (Signed)
Patient says 30 min after taking the HCTZ she gets dizzy and is afraid that this medication is causing this. Today seemed to be better but feels sick in evenings sometimes also. She would like to stop this med and needs to know what else she should take. Is drinking plenty water but since taking pill can not walk around afraid for a fall. [facility] is [facility].

## 2020-06-10 ENCOUNTER — Ambulatory Visit: Payer: PPO

## 2020-06-10 ENCOUNTER — Other Ambulatory Visit: Payer: Self-pay

## 2020-06-10 DIAGNOSIS — M6281 Muscle weakness (generalized): Secondary | ICD-10-CM | POA: Diagnosis not present

## 2020-06-10 DIAGNOSIS — R2689 Other abnormalities of gait and mobility: Secondary | ICD-10-CM

## 2020-06-10 MED ORDER — CARVEDILOL PHOSPHATE ER 10 MG PO CP24: 10.0000 mg | ORAL_CAPSULE | Freq: Every evening | ORAL | 2 refills | Status: DC

## 2020-06-10 NOTE — Therapy (Signed)
[facility] [address], [place], [address] Phone: [phone]   Fax:  [phone]  Physical Therapy Treatment  Patient Details  Name: Kayla Chan MRN: [id number] Date of Birth: February 18, 1932 Referring Provider (PT): Lelon Huh, MD    Encounter Date: 06/10/2020   PT End of Session - 06/10/20 0953    Visit Number 5    Number of Visits 17    Date for PT Re-Evaluation 07/22/20    PT Start Time 0950    PT Stop Time 1030    PT Time Calculation (min) 40 min    Equipment Utilized During Treatment Gait belt    Activity Tolerance Patient tolerated treatment well;No increased pain    Behavior During Therapy WFL for tasks assessed/performed           Past Medical History:  Diagnosis Date  . Cancer ([place])    BCC on nose  . Hypertension     Past Surgical History:  Procedure Laterality Date  . 24 hour Holter Monitor  01/2013   4550 ectopic beats with 344 superventricular bigemminy events  . Abdominal Ultrasound  05/31/2013   RUQ. Fatty Liver  . APPENDECTOMY  1950   [place]  . 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, [place]; Right    There were no vitals filed for this visit.   Subjective Assessment - 06/10/20 0951    Subjective Patient reports she's doing great however she has soreness in her R index finger.    Pertinent History 86 yo Female reports chronic knee pain (L>R) and weakness in both legs. She has had TKR (5-10 years ago each leg); Patient is hard of hearing; She presents to therapy with [facility] and states that she has been using it for last 3-4 years; She reports weakness off/on for last few years. Patient has numbness in left knee; She denies any numbness anywhere else; She denies any recent falls; She reports that when inside the house she does not use the cane, but will hold onto furniture;     Limitations Walking;Lifting;Standing    How long can you sit  comfortably? unlimited    How long can you stand comfortably? ~10 minutes    How long can you walk comfortably? ~20 minutes    Diagnostic tests Reports recent x-rays- states that left ankle showed a spur; everything looked okay in the knees- MD states that she did not need surgery per patient;     Patient Stated Goals Have better balance and be able to walk better.    Currently in Pain? No/denies            Interventions   Therapeutic Exercise -NuStep x8 minutes to help improve aerobic capacity/endurance LEVEL 2 (Seat 7/ Handle 7) -STS x10, x10 with 10# DB  -Ambulation with SPC x374ft (3x151ft bouts)  -Stairs (ascending/descending) x2 (step to pattern with bilat rail)  -Standing Hip Abduction x12 B 3#AW -Standing Hip Extension x12 B 3#AW -Step-Up 4-inch step x10 B (Unable to perform on L Side due to "feeling of Weakness") -Standing Marching x15 B 3#AW -Lateral Walks x53ft    Performed Exercise to improve patients LE strength and endurance        PT Education - 06/10/20 0953    Education provided Yes    Education Details Form/Technique for Exercises    Person(s) Educated Patient    Methods Explanation;Demonstration    Comprehension Verbalized understanding;Returned demonstration  PT Short Term Goals - 05/27/20 1110      PT SHORT TERM GOAL #1   Title Patient will be adherent to HEP at least 3x a week to improve functional strength and balance for better safety at home.    Time 4    Period Weeks    Status New    Target Date 06/24/20             PT Long Term Goals - 05/29/20 1040      PT LONG TERM GOAL #1   Title Patient will be independent in home exercise program to improve strength/mobility for better functional independence with ADLs.    Time 8    Period Weeks    Status New      PT LONG TERM GOAL #2   Title Patient will increase 10 meter walk test to >1.68m/s as to improve gait speed for better community ambulation and to reduce fall risk.     Baseline 05/27/20: 0.47 m/sec    Time 8    Period Weeks    Status New      PT LONG TERM GOAL #3   Title Patient will imporve DGI score by at least 3 points to show an improvement in gait, balance, and falls risks for community ambulation.    Baseline 05/27/20: 8/24    Time 8    Period Weeks    Status New      PT LONG TERM GOAL #4   Title Patient will imrpove her TUG time to 20Sec or less to show improvement in mobility, balance, gait, and falls risks for household and community ambulation.    Baseline 05/27/20: 29 sec    Time 8    Period Weeks    Status New      PT LONG TERM GOAL #5   Title Patient will increase his FOTO score to 61 to  show an improvement in his ability to perform functional activities.    Baseline 05/27/20: 56    Time 8    Period Weeks    Status New      Additional Long Term Goals   Additional Long Term Goals Yes      PT LONG TERM GOAL #6   Title Patient will improve 6MWT by 164 ft (59m) to show improvement in aerobic capacity and LE strength/endurance .    Baseline 05/29/20: 352.63ft (107.12m)    Time 8    Period Weeks    Status New    Target Date 07/24/20                 Plan - 06/10/20 0954    Clinical Impression Statement Patient's strength and endurance remain limited; however, she is making progress demonstrated by tolerance of increased exercise volume.  Patients L LE strength is weaker when compared to contralateral side demonstrated by reduced hip flexion excursion on L LE when compared to R LE and inability to perform step-up with L LE. Continues to require verbal and tactile cueing to perform exercises correctly.  Patient will benefit from skilled therapy to return to prior level of function.    Personal Factors and Comorbidities Age;Comorbidity 2;Fitness    Comorbidities HBP, Hard of Hearing, Prior Bilat TKAs    Examination-Activity Limitations Locomotion Level;Stairs;Stand;Lift;Squat    Examination-Participation Restrictions Yard  Work;Community Activity;Shop    Stability/Clinical Decision Making Stable/Uncomplicated    Clinical Decision Making Moderate    Rehab Potential Good    Clinical Impairments Affecting Rehab Potential motivated,  has good caregiver support;     PT Frequency 2x / week    PT Duration 8 weeks    PT Treatment/Interventions ADLs/Self Care Home Management;Aquatic Therapy;Cryotherapy;Electrical Stimulation;Moist Heat;Therapeutic exercise;Therapeutic activities;Functional mobility training;Stair training;Gait training;Balance training;Neuromuscular re-education;Patient/family education;Taping;Energy conservation;Dry needling;Passive range of motion;Joint Manipulations;Manual techniques    PT Next Visit Plan LE Strength and overall endurance    PT Home Exercise Plan STS, Standing Hip Abduction and Extension    Consulted and Agree with Plan of Care Patient;Family member/caregiver           Patient will benefit from skilled therapeutic intervention in order to improve the following deficits and impairments:  Abnormal gait, Decreased balance, Decreased endurance, Difficulty walking, Impaired sensation, Improper body mechanics, Obesity, Impaired perceived functional ability, Decreased range of motion, Decreased activity tolerance, Decreased safety awareness, Decreased strength, Postural dysfunction  Visit Diagnosis: Muscle weakness (generalized)  Other abnormalities of gait and mobility     Problem List Patient Active Problem List   Diagnosis Date Noted  . Bilateral impacted cerumen 01/25/2020  . Non-recurrent acute serous otitis media of right ear 01/25/2020  . Otitis externa 01/25/2020  . Hearing aid worn 01/25/2020  . At risk for polypharmacy 01/10/2020  . Dizzinesses 01/10/2020  . Decreased GFR 01/10/2020  . Morbid obesity ([place]) 11/23/2018  . Chronic kidney disease, stage 3 (moderate) 11/09/2018  . Limited mobility 09/03/2017  . Fatigue 02/04/2016  . Palpitations 02/04/2016  . Gout  09/24/2015  . Hyperuricemia 08/20/2015  . Back pain 06/28/2015  . Constipation 06/28/2015  . Eczema 06/28/2015  . Edema 06/28/2015  . Pre-diabetes 06/28/2015  . Intertrigo 06/28/2015  . PAC (premature atrial contraction) 06/28/2015  . Premature heartbeats 06/28/2015  . History of basal cell cancer 02/18/2015  . Fatty infiltration of liver 05/31/2013  . Hypothyroidism 01/15/2011  . Arthropathy of pelvic region and thigh 08/01/2008  . HLD (hyperlipidemia) 03/13/2008  . Arthritis, degenerative 01/26/2007  . Hypertension 12/13/2006  . Personal history of venous thrombosis and embolism 05/24/2006   10:30 AM, 06/10/20 Margarito Liner, SPT Student Physical Therapist [place]  [phone]  Margarito Liner 06/10/2020, 10:28 AM  [facility] [address], [place], [address] Phone: [phone]   Fax:  [phone]  Name: Kayla Chan MRN: [id number] Date of Birth: 1932-07-02

## 2020-06-10 NOTE — Telephone Encounter (Signed)
Patient's husband called to follow up on message left this am. Reviewed note with Patient's  Husband , Ronalee Belts, due to patient [facility],  from Dr. Caryn Section on 06/10/20. Patient 's husband verbalized  Understanding to give 1/2 HCTZ and start carvedilol. Patient's husband reports patient had headache and pain behind right eye last night. No c/o at this time, but wanted MD aware. Care advise to call back if symptoms worsen.

## 2020-06-10 NOTE — Telephone Encounter (Signed)
Reduce hctz to 1/2 tablet daily and then add carvedilol, but take it in in the evening, [facility] sent [facility].

## 2020-06-10 NOTE — Telephone Encounter (Signed)
Attempted to contact patient, no answer left a voicemail. Okay for [facility] to advise patient.  

## 2020-06-12 ENCOUNTER — Other Ambulatory Visit: Payer: Self-pay

## 2020-06-12 ENCOUNTER — Ambulatory Visit: Payer: PPO

## 2020-06-12 ENCOUNTER — Other Ambulatory Visit: Payer: Self-pay | Admitting: Family Medicine

## 2020-06-12 DIAGNOSIS — M6281 Muscle weakness (generalized): Secondary | ICD-10-CM

## 2020-06-12 DIAGNOSIS — R2689 Other abnormalities of gait and mobility: Secondary | ICD-10-CM

## 2020-06-12 NOTE — Therapy (Signed)
[facility] [address], [place], [address] Phone: [phone]   Fax:  [phone]  Physical Therapy Treatment  Patient Details  Name: Kayla Chan MRN: [id number] Date of Birth: March 03, 1932 Referring Provider (PT): Lelon Huh, MD    Encounter Date: 06/12/2020   PT End of Session - 06/12/20 0950    Visit Number 6    Number of Visits 17    Date for PT Re-Evaluation 07/22/20    PT Start Time 0945    PT Stop Time 1030    PT Time Calculation (min) 45 min    Equipment Utilized During Treatment Gait belt    Activity Tolerance Patient tolerated treatment well;No increased pain    Behavior During Therapy WFL for tasks assessed/performed           Past Medical History:  Diagnosis Date  . Cancer ([place])    BCC on nose  . Hypertension     Past Surgical History:  Procedure Laterality Date  . 24 hour Holter Monitor  01/2013   4550 ectopic beats with 344 superventricular bigemminy events  . Abdominal Ultrasound  05/31/2013   RUQ. Fatty Liver  . APPENDECTOMY  1950   [place]  . 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, [place]; Right    There were no vitals filed for this visit.   Subjective Assessment - 06/12/20 0947    Subjective Patient reports shes doing good and has forgotten her hearing aids today.    Pertinent History 86 yo Female reports chronic knee pain (L>R) and weakness in both legs. She has had TKR (5-10 years ago each leg); Patient is hard of hearing; She presents to therapy with [facility] and states that she has been using it for last 3-4 years; She reports weakness off/on for last few years. Patient has numbness in left knee; She denies any numbness anywhere else; She denies any recent falls; She reports that when inside the house she does not use the cane, but will hold onto furniture;     Limitations Walking;Lifting;Standing    How long can you sit comfortably?  unlimited    How long can you stand comfortably? ~10 minutes    How long can you walk comfortably? ~20 minutes    Diagnostic tests Reports recent x-rays- states that left ankle showed a spur; everything looked okay in the knees- MD states that she did not need surgery per patient;     Patient Stated Goals Have better balance and be able to walk better.    Currently in Pain? No/denies           Interventions   Therapeutic Exercise -NuStep x8 minutes to help improve aerobic capacity/endurance LEVEL 3 (Seat 7/ Handle 7) -STS 2x12 10# DB  -Ambulation with SPC x342ft (x267ft, x162ft)  -Stairs (ascending/descending) x2 (step to pattern with bilat rail)  -Standing Hip Abduction x12 B 3#AW -Standing Hip Extension x12 B 3#AW -Standing Hip Flkexion x12 B 3#AW -Tandem Gait Pattern with SPC 2x60ft  -SLS with UE Support x20sec B   Performed Exercise to improve patients LE strength, endurance, and balance      PT Education - 06/12/20 0948    Education provided Yes    Education Details Form/Technique for Exercises    Person(s) Educated Patient    Methods Explanation;Demonstration    Comprehension Verbalized understanding;Returned demonstration            PT Short Term Goals - 05/27/20  1110      PT SHORT TERM GOAL #1   Title Patient will be adherent to HEP at least 3x a week to improve functional strength and balance for better safety at home.    Time 4    Period Weeks    Status New    Target Date 06/24/20             PT Long Term Goals - 05/29/20 1040      PT LONG TERM GOAL #1   Title Patient will be independent in home exercise program to improve strength/mobility for better functional independence with ADLs.    Time 8    Period Weeks    Status New      PT LONG TERM GOAL #2   Title Patient will increase 10 meter walk test to >1.79m/s as to improve gait speed for better community ambulation and to reduce fall risk.    Baseline 05/27/20: 0.47 m/sec    Time 8    Period  Weeks    Status New      PT LONG TERM GOAL #3   Title Patient will imporve DGI score by at least 3 points to show an improvement in gait, balance, and falls risks for community ambulation.    Baseline 05/27/20: 8/24    Time 8    Period Weeks    Status New      PT LONG TERM GOAL #4   Title Patient will imrpove her TUG time to 20Sec or less to show improvement in mobility, balance, gait, and falls risks for household and community ambulation.    Baseline 05/27/20: 29 sec    Time 8    Period Weeks    Status New      PT LONG TERM GOAL #5   Title Patient will increase his FOTO score to 61 to  show an improvement in his ability to perform functional activities.    Baseline 05/27/20: 56    Time 8    Period Weeks    Status New      Additional Long Term Goals   Additional Long Term Goals Yes      PT LONG TERM GOAL #6   Title Patient will improve 6MWT by 164 ft (74m) to show improvement in aerobic capacity and LE strength/endurance .    Baseline 05/29/20: 352.30ft (107.72m)    Time 8    Period Weeks    Status New    Target Date 07/24/20                 Plan - 06/12/20 0951    Clinical Impression Statement Began to address patients balance during this session along with continuing to work on improving strength and muscular endurance. Patient had a few episodes of dizziness throughout session and contributes it to forgetting to take her medicine this morning.  Patient had difficulty with tandem gait pattern demonstrating a lack of hip and LE strength. Patient continues to require many rest breaks during session to recover from SOB.  Patient will benefit from skilled therapy to progress towards goals and return to prior level of function.    Personal Factors and Comorbidities Age;Comorbidity 2;Fitness    Comorbidities HBP, Hard of Hearing, Prior Bilat TKAs    Examination-Activity Limitations Locomotion Level;Stairs;Stand;Lift;Squat    Examination-Participation Restrictions Yard  Work;Community Activity;Shop    Stability/Clinical Decision Making Stable/Uncomplicated    Clinical Decision Making Moderate    Rehab Potential Good    Clinical Impairments Affecting  Rehab Potential motivated, has good caregiver support;     PT Frequency 2x / week    PT Duration 8 weeks    PT Treatment/Interventions ADLs/Self Care Home Management;Aquatic Therapy;Cryotherapy;Electrical Stimulation;Moist Heat;Therapeutic exercise;Therapeutic activities;Functional mobility training;Stair training;Gait training;Balance training;Neuromuscular re-education;Patient/family education;Taping;Energy conservation;Dry needling;Passive range of motion;Joint Manipulations;Manual techniques    PT Next Visit Plan LE Strength and overall endurance    PT Home Exercise Plan STS, Standing Hip Abduction and Extension    Consulted and Agree with Plan of Care Patient;Family member/caregiver           Patient will benefit from skilled therapeutic intervention in order to improve the following deficits and impairments:  Abnormal gait, Decreased balance, Decreased endurance, Difficulty walking, Impaired sensation, Improper body mechanics, Obesity, Impaired perceived functional ability, Decreased range of motion, Decreased activity tolerance, Decreased safety awareness, Decreased strength, Postural dysfunction  Visit Diagnosis: Other abnormalities of gait and mobility  Muscle weakness (generalized)     Problem List Patient Active Problem List   Diagnosis Date Noted  . Bilateral impacted cerumen 01/25/2020  . Non-recurrent acute serous otitis media of right ear 01/25/2020  . Otitis externa 01/25/2020  . Hearing aid worn 01/25/2020  . At risk for polypharmacy 01/10/2020  . Dizzinesses 01/10/2020  . Decreased GFR 01/10/2020  . Morbid obesity ([place]) 11/23/2018  . Chronic kidney disease, stage 3 (moderate) 11/09/2018  . Limited mobility 09/03/2017  . Fatigue 02/04/2016  . Palpitations 02/04/2016  . Gout  09/24/2015  . Hyperuricemia 08/20/2015  . Back pain 06/28/2015  . Constipation 06/28/2015  . Eczema 06/28/2015  . Edema 06/28/2015  . Pre-diabetes 06/28/2015  . Intertrigo 06/28/2015  . PAC (premature atrial contraction) 06/28/2015  . Premature heartbeats 06/28/2015  . History of basal cell cancer 02/18/2015  . Fatty infiltration of liver 05/31/2013  . Hypothyroidism 01/15/2011  . Arthropathy of pelvic region and thigh 08/01/2008  . HLD (hyperlipidemia) 03/13/2008  . Arthritis, degenerative 01/26/2007  . Hypertension 12/13/2006  . Personal history of venous thrombosis and embolism 05/24/2006   10:31 AM, 06/12/20 Margarito Liner, SPT Student Physical Therapist [place]  [phone]  Margarito Liner 06/12/2020, 10:27 AM  [facility] [address] [place], [address] Phone: [phone]   Fax:  [phone]  Name: Noely Kuhnle MRN: [id number] Date of Birth: 1931/12/21

## 2020-06-13 DIAGNOSIS — Z85828 Personal history of other malignant neoplasm of skin: Secondary | ICD-10-CM | POA: Diagnosis not present

## 2020-06-13 DIAGNOSIS — D492 Neoplasm of unspecified behavior of bone, soft tissue, and skin: Secondary | ICD-10-CM | POA: Diagnosis not present

## 2020-06-13 DIAGNOSIS — D0439 Carcinoma in situ of skin of other parts of face: Secondary | ICD-10-CM | POA: Diagnosis not present

## 2020-06-13 DIAGNOSIS — R21 Rash and other nonspecific skin eruption: Secondary | ICD-10-CM | POA: Diagnosis not present

## 2020-06-17 ENCOUNTER — Ambulatory Visit: Payer: PPO

## 2020-06-19 ENCOUNTER — Ambulatory Visit: Payer: PPO

## 2020-06-25 ENCOUNTER — Ambulatory Visit (INDEPENDENT_AMBULATORY_CARE_PROVIDER_SITE_OTHER): Payer: PPO | Admitting: Family Medicine

## 2020-06-25 ENCOUNTER — Encounter: Payer: Self-pay | Admitting: Family Medicine

## 2020-06-25 ENCOUNTER — Other Ambulatory Visit: Payer: Self-pay

## 2020-06-25 VITALS — BP 158/80 | HR 76 | Temp 97.5°F | Resp 18 | Wt 246.0 lb

## 2020-06-25 DIAGNOSIS — I10 Essential (primary) hypertension: Secondary | ICD-10-CM | POA: Diagnosis not present

## 2020-06-25 DIAGNOSIS — R42 Dizziness and giddiness: Secondary | ICD-10-CM | POA: Diagnosis not present

## 2020-06-25 MED ORDER — LABETALOL HCL 100 MG PO TABS
50.0000 mg | ORAL_TABLET | Freq: Two times a day (BID) | ORAL | 1 refills | Status: DC
Start: 2020-06-25 — End: 2020-07-09

## 2020-06-25 NOTE — Progress Notes (Signed)
Established patient visit   Patient: Kayla Chan   DOB: 08-Oct-1931   86 y.o. Female  MRN: [id number] Visit Date: 06/25/2020  Today's healthcare provider: Lelon Huh, MD   Chief Complaint  Patient presents with  . Hypertension   Subjective    HPI  Hypertension, follow-up  BP Readings from Last 3 Encounters:  06/25/20 (!) 158/80  05/21/20 (!) 156/75  04/16/20 (!) 152/70   Wt Readings from Last 3 Encounters:  06/25/20 246 lb (111.6 kg)  05/21/20 250 lb (113.4 kg)  04/16/20 248 lb (112.5 kg)     She was last seen for hypertension 05/21/2020.  BP at that visit was 156/75. Management during that visit includes starting HCTZ  25MG  daily. Since last visit, HCTZ dosage was decreased to 1/2 tablet daily due to patient complaining of dizziness. An evening dose of Carvedilol was added. She states she doesn't like the way it makes feel. She takes it about 7-8 pm and feels very dizzy when she goes to bed.   She reports good compliance with treatment. She is not having side effects.  She is following a Regular diet. She is not exercising. She does not smoke.  Use of agents associated with hypertension: NSAIDS.   Outside blood pressures are checked and average 119-417 systolic/ 40-81 diastolic reading. Symptoms: No chest pain No chest pressure  No palpitations No syncope  No dyspnea No orthopnea  No paroxysmal nocturnal dyspnea No lower extremity edema   Pertinent labs: Lab Results  Component Value Date   CHOL 267 (H) 09/15/2019   HDL 53 09/15/2019   LDLCALC 165 (H) 09/15/2019   TRIG 260 (H) 09/15/2019   CHOLHDL 5.0 (H) 09/15/2019   Lab Results  Component Value Date   NA 141 04/16/2020   K 4.1 04/16/2020   CREATININE 1.21 (H) 04/16/2020   GFRNONAA 41 (L) 04/16/2020   GFRAA 47 (L) 04/16/2020   GLUCOSE 96 04/16/2020     The ASCVD Risk score (Goff DC Jr., et al., 2013) failed to calculate for the following reasons:   The 2013 ASCVD risk score is only valid for  ages 66 to 91   ---------------------------------------------------------------------------------------------------     Medications: Outpatient Medications Prior to Visit  Medication Sig  . aspirin 81 MG tablet Take 81 mg by mouth every other day.   . carvedilol (COREG CR) 10 MG 24 hr capsule Take 1 capsule (10 mg total) by mouth every evening.  . colchicine 0.6 MG tablet Take 1 tablet (0.6 mg total) by mouth daily as needed (gout).  Marland Kitchen diclofenac (CATAFLAM) 50 MG tablet TAKE 1 TABLET (50 MG TOTAL) BY MOUTH 2 (TWO) TIMES DAILY AS NEEDED (FOOT PAIN).  Marland Kitchen ezetimibe (ZETIA) 10 MG tablet TAKE 1 TABLET BY MOUTH EVERY DAY IN THE MORNING  . hydrochlorothiazide (HYDRODIURIL) 25 MG tablet TAKE 1 TABLET BY MOUTH EVERY DAY (Patient taking differently: 12.5 mg. )  . hydrocortisone 2.5 % lotion Apply to face once daily as needed for itchiness  . ketoconazole (NIZORAL) 2 % shampoo Apply topically.   Marland Kitchen levothyroxine (SYNTHROID) 75 MCG tablet TAKE 1 TABLET BY MOUTH EVERY DAY  . meloxicam (MOBIC) 15 MG tablet Take 1 tablet (15 mg total) by mouth daily.  . mometasone (ELOCON) 0.1 % ointment APPLY 1 APPLICATION DAILY AS NEEDED TOPICALLY. APPLY TO AFFECTED AREA  . Multiple Vitamins-Minerals (MULTIVITAMIN ADULT PO) Take 1 tablet by mouth daily.  . pimecrolimus (ELIDEL) 1 % cream Apply topically 2 (two) times daily.  Marland Kitchen  tacrolimus (PROTOPIC) 0.1 % ointment Apply to itching on face twice daily   No facility-administered medications prior to visit.    Review of Systems  Constitutional: Negative for appetite change, chills, fatigue and fever.  Respiratory: Negative for chest tightness and shortness of breath.   Cardiovascular: Negative for chest pain and palpitations.  Gastrointestinal: Negative for abdominal pain, nausea and vomiting.  Musculoskeletal: Positive for back pain (improved).  Neurological: Positive for dizziness (worsens with walking or turning her head). Negative for weakness.      Objective      BP (!) 158/80 (BP Location: Right Wrist, Patient Position: Sitting, Cuff Size: Normal)   Pulse 76   Temp (!) 97.5 F (36.4 C) (Oral)   Resp 18   Wt 246 lb (111.6 kg)   BMI 48.04 kg/m    Physical Exam  General appearance: Obese female, cooperative and in no acute distress Head: Normocephalic, without obvious abnormality, atraumatic Respiratory: Respirations even and unlabored, normal respiratory rate Extremities: All extremities are intact.  Skin: Skin color, texture, turgor normal. No rashes seen  Psych: Appropriate mood and affect. Neurologic: Mental status: Alert, oriented to person, place, and time, thought content appropriate.   No results found for any visits on 06/25/20.  Assessment & Plan     1. Primary hypertension Self reported dizziness c/w hypotension associated with several classes of antihypertensives. Same sx reported with recent trial of very low dose carvedilol and very low dose of hctz. She wants to try a different medications. Will try - labetalol (NORMODYNE) 100 MG tablet; Take 0.5 tablets (50 mg total) by mouth 2 (two) times daily.  Dispense: 30 tablet; Refill: 1   2. Dizzy She associates this with several different antihypertensives despite trial of very low doses of medications.         The entirety of the information documented in the History of Present Illness, Review of Systems and Physical Exam were personally obtained by me. Portions of this information were initially documented by the CMA and reviewed by me for thoroughness and accuracy.      Lelon Huh, MD  [facility] [phone] (phone) [phone] (fax)  [place]

## 2020-07-01 ENCOUNTER — Ambulatory Visit: Payer: Self-pay | Admitting: Family Medicine

## 2020-07-01 NOTE — Telephone Encounter (Signed)
Pt's husband calling, pt present. States pt's BP elevated since starting new med (Labetalol ) on 06/25/20.  Reports in AMs prior to taking morning dose, 190/107 ,after med down to 164/87, HR 70.  Reports this AM 183/105 prior to med, after 157/87  HR 70.  During call, after having taken med at 0830, BP 156/82. Does not have BP values with evening doses. Verified taking 1/2 tab for 50mg  BID.  Spoke to pt, reports dizziness remains, headache at times, not presently.  Also reports "Flushed and nauseated after taking med."  Has appt. 07/09/2020, Husband states "She can't take this med any longer" Assured pt and husband NT would route to practice for PCPs review. Verbalizes understanding.  Please advise: [phone]  Reason for Disposition . Systolic BP  >= 675 OR Diastolic >= 916  Answer Assessment - Initial Assessment Questions 1. BLOOD PRESSURE: "What is the blood pressure?" "Did you take at least two measurements 5 minutes apart?"     156/82 2. ONSET: "When did you take your blood pressure?"     Multiple times this AM 3. HOW: "How did you obtain the blood pressure?" (e.g., visiting nurse, autom atic home BP monitor)     Home monitor 4. HISTORY: "Do you have a history of high blood pressure?"     yes 5. MEDICATIONS: "Are you taking any medications for blood pressure?" "Have you missed any doses recently?"     Yes, no missed doses 6. OTHER SYMPTOMS: "Do you have any symptoms?" (e.g., headache, chest pain, blurred vision, difficulty breathing, weakness)     "Flushed after taking"  Protocols used: BLOOD PRESSURE - HIGH-A-AH

## 2020-07-09 ENCOUNTER — Encounter: Payer: Self-pay | Admitting: Family Medicine

## 2020-07-09 ENCOUNTER — Ambulatory Visit: Payer: Self-pay | Admitting: Family Medicine

## 2020-07-09 ENCOUNTER — Ambulatory Visit (INDEPENDENT_AMBULATORY_CARE_PROVIDER_SITE_OTHER): Payer: PPO | Admitting: Family Medicine

## 2020-07-09 ENCOUNTER — Other Ambulatory Visit: Payer: Self-pay

## 2020-07-09 VITALS — BP 152/75 | HR 75 | Temp 98.1°F | Resp 18 | Wt 249.0 lb

## 2020-07-09 DIAGNOSIS — I10 Essential (primary) hypertension: Secondary | ICD-10-CM

## 2020-07-09 DIAGNOSIS — H00019 Hordeolum externum unspecified eye, unspecified eyelid: Secondary | ICD-10-CM | POA: Diagnosis not present

## 2020-07-09 MED ORDER — HYDROXYZINE HCL 10 MG PO TABS: 10.0000 mg | ORAL_TABLET | Freq: Three times a day (TID) | ORAL | 0 refills | Status: DC | PRN

## 2020-07-09 MED ORDER — BENAZEPRIL HCL 10 MG PO TABS: 10.0000 mg | ORAL_TABLET | Freq: Every day | ORAL | 1 refills | Status: DC

## 2020-07-09 MED ORDER — SULFACETAMIDE SODIUM 10 % OP OINT: TOPICAL_OINTMENT | OPHTHALMIC | 0 refills | Status: DC

## 2020-07-09 NOTE — Progress Notes (Signed)
Established patient visit   Patient: Kayla Chan   DOB: [birth year]   86 y.o. Female  MRN: [id number] Visit Date: 07/09/2020  Today's healthcare provider: Lelon Huh, MD   Chief Complaint  Patient presents with  . Hypertension  . Dizziness   Subjective    HPI  Hypertension, follow-up  BP Readings from Last 3 Encounters:  07/09/20 (!) 152/75  06/25/20 (!) 158/80  05/21/20 (!) 156/75   Wt Readings from Last 3 Encounters:  07/09/20 249 lb (112.9 kg)  06/25/20 246 lb (111.6 kg)  05/21/20 250 lb (113.4 kg)     She was last seen for hypertension 06/25/2020. BP at that visit was 158/80. Management since that visit includes  trying - labetalol (NORMODYNE) 100 MG tablet; Take 0.5 tablets (50 mg total) by mouth 2 (two) times daily..  She reports good compliance with treatment. She is not having side effects.  She is following a Regular diet. She is not exercising. She does not smoke.  Use of agents associated with hypertension: NSAIDS.   Outside blood pressures are 157-202/ 70-105.Blood pressure is higher in the mornings Symptoms: No chest pain No chest pressure  No palpitations No syncope  No dyspnea No orthopnea  No paroxysmal nocturnal dyspnea No lower extremity edema   Pertinent labs: Lab Results  Component Value Date   CHOL 267 (H) 09/15/2019   HDL 53 09/15/2019   LDLCALC 165 (H) 09/15/2019   TRIG 260 (H) 09/15/2019   CHOLHDL 5.0 (H) 09/15/2019   Lab Results  Component Value Date   NA 141 04/16/2020   K 4.1 04/16/2020   CREATININE 1.21 (H) 04/16/2020   GFRNONAA 41 (L) 04/16/2020   GFRAA 47 (L) 04/16/2020   GLUCOSE 96 04/16/2020     The ASCVD Risk score (Goff DC Jr., et al., 2013) failed to calculate for the following reasons:   The 2013 ASCVD risk score is only valid for ages 79 to 68   ---------------------------------------------------------------------------------------------------  Follow up for dizziness:  The patient was last seen for  this 06/25/2020.  Changes made at last visit include trying - labetalol (NORMODYNE) 100 MG tablet; Take 0.5 tablets (50 mg total) by mouth 2 (two) times daily.  She reports good compliance with treatment. She feels that condition is Unchanged. She is not having side effects.   -----------------------------------------------------------------------------------------     Medications: Outpatient Medications Prior to Visit  Medication Sig  . aspirin 81 MG tablet Take 81 mg by mouth every other day.   . colchicine 0.6 MG tablet Take 1 tablet (0.6 mg total) by mouth daily as needed (gout).  Marland Kitchen diclofenac (CATAFLAM) 50 MG tablet TAKE 1 TABLET (50 MG TOTAL) BY MOUTH 2 (TWO) TIMES DAILY AS NEEDED (FOOT PAIN).  Marland Kitchen ezetimibe (ZETIA) 10 MG tablet TAKE 1 TABLET BY MOUTH EVERY DAY IN THE MORNING  . hydrocortisone 2.5 % lotion Apply to face once daily as needed for itchiness  . ketoconazole (NIZORAL) 2 % shampoo Apply topically.   Marland Kitchen labetalol (NORMODYNE) 100 MG tablet Take 0.5 tablets (50 mg total) by mouth 2 (two) times daily.  Marland Kitchen levothyroxine (SYNTHROID) 75 MCG tablet TAKE 1 TABLET BY MOUTH EVERY DAY  . meloxicam (MOBIC) 15 MG tablet Take 1 tablet (15 mg total) by mouth daily.  . mometasone (ELOCON) 0.1 % ointment APPLY 1 APPLICATION DAILY AS NEEDED TOPICALLY. APPLY TO AFFECTED AREA  . Multiple Vitamins-Minerals (MULTIVITAMIN ADULT PO) Take 1 tablet by mouth daily.  . pimecrolimus (ELIDEL) 1 % cream  Apply topically 2 (two) times daily.  . tacrolimus (PROTOPIC) 0.1 % ointment Apply to itching on face twice daily   No facility-administered medications prior to visit.    Review of Systems  Constitutional: Negative for appetite change, chills, fatigue and fever.  Respiratory: Negative for chest tightness and shortness of breath.   Cardiovascular: Negative for chest pain and palpitations.  Gastrointestinal: Negative for abdominal pain, nausea and vomiting.  Neurological: Positive for dizziness.  Negative for weakness.      Objective    BP (!) 152/75 (BP Location: Right Wrist, Patient Position: Sitting, Cuff Size: Normal)   Pulse 75   Temp 98.1 F (36.7 C) (Oral)   Resp 18   Wt 249 lb (112.9 kg)   BMI 48.63 kg/m   Physical Exam  General appearance: Overweight female, cooperative and in no acute distress Head: Normocephalic, without obvious abnormality, atraumatic Respiratory: Respirations even and unlabored, normal respiratory rate   Assessment & Plan     1. Primary hypertension Home Bps labile and uncontrolled since change to low dose of labetalol, yet she still has dizziness after taking it. She has similar reaction to may other blood pressure medications over the last year. She had previously taken lisinopril for many years which was effective and well tolerated but stopped due to itching in her feet, but never had any throat swelling or wheezing associated with it. She and her husband are understandably very frustrated with side effects of all these medications and very high blood pressure readings she has been getting. She is hesitant to take any other medications that might make her dizzy. After lengthy discussion, it was decided to try something similar, but not not identical to lisinopril. Will try - benazepril (LOTENSIN) 10 MG tablet; Take 1 tablet (10 mg total) by mouth daily.  Dispense: 30 tablet; Refill: 1  If she has any itching can take - hydrOXYzine (ATARAX/VISTARIL) 10 MG tablet; Take 1 tablet (10 mg total) by mouth every 8 (eight) hours as needed for itching.  Dispense: 30 tablet; Refill: 0   If she has any sensation of throat swelling or wheezing, then she is to stop the benazepril.   2. Hordeolum externum, unspecified laterality She does report a stye in her right eye for a few weeks, and that she was previously an antibiotic gel that was effective - sulfacetamide (BLEPH-10) 10 % ophthalmic ointment; Apply to affected eye four times a day  Dispense: 3.5 g;  Refill: 0        The entirety of the information documented in the History of Present Illness, Review of Systems and Physical Exam were personally obtained by me. Portions of this information were initially documented by the CMA and reviewed by me for thoroughness and accuracy.      Lelon Huh, MD  [facility] [phone] (phone) [phone] (fax)  [place]

## 2020-07-10 ENCOUNTER — Other Ambulatory Visit: Payer: Self-pay | Admitting: Family Medicine

## 2020-07-10 DIAGNOSIS — H00019 Hordeolum externum unspecified eye, unspecified eyelid: Secondary | ICD-10-CM

## 2020-07-10 MED ORDER — SULFACETAMIDE SODIUM 10 % OP SOLN: 1.0000 [drp] | OPHTHALMIC | 0 refills | Status: DC

## 2020-07-10 NOTE — Telephone Encounter (Signed)
° °  Notes to clinic:  Product Backordered/Unavailable:PRODUCT IS NO LONGER AVAILABLE. WOULD YOU CONSIDER SWITCHING TO THE SOLUTION   Requested Prescriptions  Pending Prescriptions Disp Refills   sulfacetamide (BLEPH-10) 10 % ophthalmic solution [Pharmacy Med Name: SULFACETAMIDE 10% EYE DROPS] 15 mL 0    Sig: Please specify directions, refills and quantity      Off-Protocol Failed - 07/10/2020 10:04 AM      Failed - Medication not assigned to a protocol, review manually.      Passed - Valid encounter within last 12 months    Recent Outpatient Visits           Yesterday Primary hypertension   [facility] Birdie Sons, MD   2 weeks ago Primary hypertension   [facility] Birdie Sons, MD   1 month ago [place], Kirstie Peri, MD   2 months ago Essential hypertension   [facility] Birdie Sons, MD   4 months ago Essential hypertension   [facility] Birdie Sons, MD       Future Appointments             In 1 week Fisher, Kirstie Peri, MD [facility], [place]

## 2020-07-10 NOTE — Progress Notes (Signed)
Received fax from [facility] sulfacetamide ointment not available. Request change prescription to solution.

## 2020-07-15 ENCOUNTER — Telehealth: Payer: Self-pay | Admitting: *Deleted

## 2020-07-15 NOTE — Telephone Encounter (Signed)
Should the patient continue benazepril 10mg ? Please advise. Thanks!

## 2020-07-15 NOTE — Telephone Encounter (Signed)
That's not a typical side effect of benazepril. Please get more information. Location of pain, character of pain, severity of pain, and bowel changes like constipation or diarrhea.

## 2020-07-15 NOTE — Telephone Encounter (Signed)
Patient reports that about 24mins-1hr after taking the pill, she has a sharp pain in her left side that lasts for about 10-34min. She takes the pill at bedtime. She reports that she has the pain every hour. The pain was at its worse last night. She reports that her pain is better when lying down. Pain is worse when moving and walking around. She does feel constipated. Denies diarrhea. She did have a low grade temp about 2 days ago.   She reports that the medication does help with her blood pressure and it does not make her dizzy. Her BP has averaged in the 120s/60s since starting the medication. However, she feels that it could be causing her stomach issues.

## 2020-07-15 NOTE — Telephone Encounter (Signed)
Have her reduce to 1/2 tablet daily

## 2020-07-15 NOTE — Telephone Encounter (Signed)
Advised patient husband as below.

## 2020-07-15 NOTE — Telephone Encounter (Signed)
Copied from [place] [phone]. Topic: General - Inquiry >> Jul 15, 2020  3:20 PM Greggory Keen D wrote: Reason for CRM: Pt' called saying the medication that was prescribed for her for her blood pressure is working but is causing stomach pain like diverticulitis.  They would like a nurse to call back and advise  [phone]

## 2020-07-15 NOTE — Telephone Encounter (Signed)
Has this medication problem been resolved?

## 2020-07-17 ENCOUNTER — Other Ambulatory Visit: Payer: Self-pay | Admitting: Family Medicine

## 2020-07-23 ENCOUNTER — Other Ambulatory Visit: Payer: Self-pay

## 2020-07-23 ENCOUNTER — Ambulatory Visit (INDEPENDENT_AMBULATORY_CARE_PROVIDER_SITE_OTHER): Payer: PPO | Admitting: Family Medicine

## 2020-07-23 ENCOUNTER — Encounter: Payer: Self-pay | Admitting: Family Medicine

## 2020-07-23 VITALS — BP 163/80 | HR 85 | Temp 97.9°F | Resp 20 | Wt 251.0 lb

## 2020-07-23 DIAGNOSIS — I10 Essential (primary) hypertension: Secondary | ICD-10-CM

## 2020-07-23 DIAGNOSIS — R609 Edema, unspecified: Secondary | ICD-10-CM | POA: Diagnosis not present

## 2020-07-23 MED ORDER — FUROSEMIDE 20 MG PO TABS: 10.0000 mg | ORAL_TABLET | Freq: Every morning | ORAL | 2 refills | Status: DC

## 2020-07-23 NOTE — Progress Notes (Signed)
Established patient visit   Patient: Kayla Chan   DOB: [birth year]   86 y.o. Female  MRN: [id number] Visit Date: 07/23/2020  Today's healthcare provider: Lelon Huh, MD   Chief Complaint  Patient presents with  . Hypertension   Subjective    HPI  Hypertension, follow-up  BP Readings from Last 3 Encounters:  07/23/20 (!) 163/80  07/09/20 (!) 152/75  06/25/20 (!) 158/80   Wt Readings from Last 3 Encounters:  07/23/20 251 lb (113.9 kg)  07/09/20 249 lb (112.9 kg)  06/25/20 246 lb (111.6 kg)     She was last seen for hypertension 2 weeks ago.  BP at that visit was 152/75. Management since that visit includes trying - benazepril (LOTENSIN) 10 MG tablet.  She reports good compliance with treatment. Has been taking 1/2 tablet daily of Benazepril. She is having side effects. (dry mouth, dizziness, muscle pains, edema, off balanced, itchiness of the legs) She is following a Regular diet. She is not exercising. She does not smoke.  Use of agents associated with hypertension: NSAIDS.   Outside blood pressures are 120-180/ . Symptoms: No chest pain No chest pressure  No palpitations No syncope  No dyspnea No orthopnea  No paroxysmal nocturnal dyspnea Yes lower extremity edema   Pertinent labs: Lab Results  Component Value Date   CHOL 267 (H) 09/15/2019   HDL 53 09/15/2019   LDLCALC 165 (H) 09/15/2019   TRIG 260 (H) 09/15/2019   CHOLHDL 5.0 (H) 09/15/2019   Lab Results  Component Value Date   NA 141 04/16/2020   K 4.1 04/16/2020   CREATININE 1.21 (H) 04/16/2020   GFRNONAA 41 (L) 04/16/2020   GFRAA 47 (L) 04/16/2020   GLUCOSE 96 04/16/2020     The ASCVD Risk score (Goff DC Jr., et al., 2013) failed to calculate for the following reasons:   The 2013 ASCVD risk score is only valid for ages 30 to 44   ---------------------------------------------------------------------------------------------------     Medications: Outpatient Medications Prior to  Visit  Medication Sig  . aspirin 81 MG tablet Take 81 mg by mouth every other day.   . benazepril (LOTENSIN) 10 MG tablet Take 1 tablet (10 mg total) by mouth daily.  . colchicine 0.6 MG tablet Take 1 tablet (0.6 mg total) by mouth daily as needed (gout).  Marland Kitchen diclofenac (CATAFLAM) 50 MG tablet TAKE 1 TABLET (50 MG TOTAL) BY MOUTH 2 (TWO) TIMES DAILY AS NEEDED (FOOT PAIN).  Marland Kitchen ezetimibe (ZETIA) 10 MG tablet TAKE 1 TABLET BY MOUTH EVERY DAY IN THE MORNING  . hydrocortisone 2.5 % lotion Apply to face once daily as needed for itchiness  . hydrOXYzine (ATARAX/VISTARIL) 10 MG tablet Take 1 tablet (10 mg total) by mouth every 8 (eight) hours as needed for itching.  Marland Kitchen ketoconazole (NIZORAL) 2 % shampoo Apply topically.   Marland Kitchen levothyroxine (SYNTHROID) 75 MCG tablet TAKE 1 TABLET BY MOUTH EVERY DAY  . meloxicam (MOBIC) 15 MG tablet Take 1 tablet (15 mg total) by mouth daily.  . mometasone (ELOCON) 0.1 % ointment APPLY 1 APPLICATION DAILY AS NEEDED TOPICALLY. APPLY TO AFFECTED AREA  . Multiple Vitamins-Minerals (MULTIVITAMIN ADULT PO) Take 1 tablet by mouth daily.  . pimecrolimus (ELIDEL) 1 % cream Apply topically 2 (two) times daily.  Marland Kitchen sulfacetamide (BLEPH-10) 10 % ophthalmic solution Place 1 drop into the right eye every 4 (four) hours.  . tacrolimus (PROTOPIC) 0.1 % ointment Apply to itching on face twice daily   No facility-administered  medications prior to visit.    Review of Systems  Constitutional: Positive for fatigue. Negative for appetite change, chills and fever.  Respiratory: Negative for chest tightness and shortness of breath.   Cardiovascular: Positive for leg swelling. Negative for chest pain and palpitations.  Gastrointestinal: Negative for abdominal pain, nausea and vomiting.  Musculoskeletal: Positive for gait problem and myalgias.  Skin:       Itchy skin  Neurological: Positive for dizziness. Negative for weakness.    {Heme  Chem  Endocrine  Serology  Results Review  (optional):[id number]::" "}  Objective    BP (!) 163/80 (BP Location: Right Wrist, Patient Position: Sitting)   Pulse 85   Temp 97.9 F (36.6 C) (Oral)   Resp 20   Wt 251 lb (113.9 kg)   BMI 49.02 kg/m    Physical Exam   General appearance: Obese female, cooperative and in no acute distress Head: Normocephalic, without obvious abnormality, atraumatic Respiratory: Respirations even and unlabored, normal respiratory rate Extremities: All extremities are intact. 2+ pedal edema.  Skin: Skin color, texture, turgor normal. No rashes seen  Psych: Appropriate mood and affect. Neurologic: Mental status: Alert, oriented to person, place, and time, thought content appropriate.   No results found for any visits on 07/23/20.  Assessment & Plan     1. Primary hypertension Improved since starting benazepril, but having some swelling. Add - furosemide (LASIX) 20 MG tablet; Take 0.5 tablets (10 mg total) by mouth in the morning.  Dispense: 30 tablet; Refill: 2  2. Edema, unspecified type  - furosemide (LASIX) 20 MG tablet; Take 0.5 tablets (10 mg total) by mouth in the morning.  Dispense: 30 tablet; Refill: 2   No follow-ups on file.      The entirety of the information documented in the History of Present Illness, Review of Systems and Physical Exam were personally obtained by me. Portions of this information were initially documented by the CMA and reviewed by me for thoroughness and accuracy.    Return in about 3 weeks for BP check.   Lelon Huh, MD  [facility] [phone] (phone) [phone] (fax)  [place]

## 2020-07-23 NOTE — Patient Instructions (Signed)
.   Please review the attached list of medications and notify my office if there are any errors.   . Continue taking 1/2 tablet of benazepril every evening   Start taking 1/2 tablet of furosemide every morning to keep swelling under control

## 2020-07-31 ENCOUNTER — Other Ambulatory Visit: Payer: Self-pay | Admitting: Family Medicine

## 2020-07-31 DIAGNOSIS — I10 Essential (primary) hypertension: Secondary | ICD-10-CM

## 2020-07-31 NOTE — Telephone Encounter (Signed)
Prescription dosage change on 07/15/20 to 1/2 tablet. This refill is not needed at this time. Refusing for that reason.

## 2020-08-19 ENCOUNTER — Encounter: Payer: Self-pay | Admitting: Family Medicine

## 2020-08-19 ENCOUNTER — Other Ambulatory Visit: Payer: Self-pay

## 2020-08-19 ENCOUNTER — Ambulatory Visit (INDEPENDENT_AMBULATORY_CARE_PROVIDER_SITE_OTHER): Payer: PPO | Admitting: Family Medicine

## 2020-08-19 VITALS — BP 149/77 | HR 80 | Temp 97.7°F | Resp 16 | Wt 245.0 lb

## 2020-08-19 DIAGNOSIS — E790 Hyperuricemia without signs of inflammatory arthritis and tophaceous disease: Secondary | ICD-10-CM | POA: Diagnosis not present

## 2020-08-19 DIAGNOSIS — E039 Hypothyroidism, unspecified: Secondary | ICD-10-CM | POA: Diagnosis not present

## 2020-08-19 DIAGNOSIS — I10 Essential (primary) hypertension: Secondary | ICD-10-CM

## 2020-08-19 DIAGNOSIS — R42 Dizziness and giddiness: Secondary | ICD-10-CM

## 2020-08-19 NOTE — Progress Notes (Signed)
Established patient visit   Patient: Kayla Chan   DOB: [birth year]   86 y.o. Female  MRN: [id number] Visit Date: 08/19/2020  Today's healthcare provider: Mila Merry, MD   Chief Complaint  Patient presents with  . Hypertension   Subjective    HPI  Hypertension, follow-up  BP Readings from Last 3 Encounters:  08/19/20 (!) 149/77  07/23/20 (!) 163/80  07/09/20 (!) 152/75   Wt Readings from Last 3 Encounters:  08/19/20 245 lb (111.1 kg)  07/23/20 251 lb (113.9 kg)  07/09/20 249 lb (112.9 kg)     She was last seen for hypertension 07/23/2020.  BP at that visit was 163/80. Management since that visit includes adding Furosemide to help with swelling.  She reports good compliance with treatment. She has only taken Furosemide a few times since last visit. She states it does help reduce swelling whenever she takes it.  She is having side effects. Since starting benazepril she has had some itching in her feet at night, which keeps her up. She was prescribed low dose hydroxyzine to take prn, but she hasn't tried it.  She is following a Regular diet. She is not exercising. She does not smoke.  Use of agents associated with hypertension: NSAIDS and thyroid hormones.   Outside blood pressures are checked and average 131/77. Symptoms: No chest pain No chest pressure  No palpitations No syncope  No dyspnea No orthopnea  No paroxysmal nocturnal dyspnea Yes lower extremity edema   Pertinent labs: Lab Results  Component Value Date   CHOL 267 (H) 09/15/2019   HDL 53 09/15/2019   LDLCALC 165 (H) 09/15/2019   TRIG 260 (H) 09/15/2019   CHOLHDL 5.0 (H) 09/15/2019   Lab Results  Component Value Date   NA 141 04/16/2020   K 4.1 04/16/2020   CREATININE 1.21 (H) 04/16/2020   GFRNONAA 41 (L) 04/16/2020   GFRAA 47 (L) 04/16/2020   GLUCOSE 96 04/16/2020     The ASCVD Risk score (Goff DC Jr., et al., 2013) failed to calculate for the following reasons:   The 2013 ASCVD  risk score is only valid for ages 47 to 24   --------------------------------------------------------------------------------------------------- She also states she had gout flare over the weekend which has resolved. She thinks it may have been from eating ham.      Medications: Outpatient Medications Prior to Visit  Medication Sig  . aspirin 81 MG tablet Take 81 mg by mouth every other day.   . benazepril (LOTENSIN) 10 MG tablet Take 1 tablet (10 mg total) by mouth daily.  . colchicine 0.6 MG tablet Take 1 tablet (0.6 mg total) by mouth daily as needed (gout).  Marland Kitchen diclofenac (CATAFLAM) 50 MG tablet TAKE 1 TABLET (50 MG TOTAL) BY MOUTH 2 (TWO) TIMES DAILY AS NEEDED (FOOT PAIN).  Marland Kitchen ezetimibe (ZETIA) 10 MG tablet TAKE 1 TABLET BY MOUTH EVERY DAY IN THE MORNING  . furosemide (LASIX) 20 MG tablet Take 0.5 tablets (10 mg total) by mouth in the morning.  . hydrocortisone 2.5 % lotion Apply to face once daily as needed for itchiness  . hydrOXYzine (ATARAX/VISTARIL) 10 MG tablet Take 1 tablet (10 mg total) by mouth every 8 (eight) hours as needed for itching.  Marland Kitchen ketoconazole (NIZORAL) 2 % shampoo Apply topically.   Marland Kitchen levothyroxine (SYNTHROID) 75 MCG tablet TAKE 1 TABLET BY MOUTH EVERY DAY  . meloxicam (MOBIC) 15 MG tablet Take 1 tablet (15 mg total) by mouth daily.  Marland Kitchen  mometasone (ELOCON) 0.1 % ointment APPLY 1 APPLICATION DAILY AS NEEDED TOPICALLY. APPLY TO AFFECTED AREA  . Multiple Vitamins-Minerals (MULTIVITAMIN ADULT PO) Take 1 tablet by mouth daily.  . pimecrolimus (ELIDEL) 1 % cream Apply topically 2 (two) times daily.  Marland Kitchen sulfacetamide (BLEPH-10) 10 % ophthalmic solution Place 1 drop into the right eye every 4 (four) hours.  . tacrolimus (PROTOPIC) 0.1 % ointment Apply to itching on face twice daily   No facility-administered medications prior to visit.    Review of Systems  Constitutional: Negative for appetite change, chills, fatigue and fever.  Respiratory: Negative for chest tightness  and shortness of breath.   Cardiovascular: Positive for leg swelling. Negative for chest pain and palpitations.  Gastrointestinal: Negative for abdominal pain, nausea and vomiting.  Musculoskeletal: Positive for joint swelling (in right hand).  Neurological: Positive for dizziness. Negative for weakness.      Objective    BP (!) 149/77 (BP Location: Left Wrist, Patient Position: Sitting, Cuff Size: Normal)   Pulse 80   Temp 97.7 F (36.5 C) (Oral)   Resp 16   Wt 245 lb (111.1 kg)   BMI 47.85 kg/m    Physical Exam   General: Appearance:    Severely obese female in no acute distress  Eyes:    PERRL, conjunctiva/corneas clear, EOM's intact       Lungs:     Clear to auscultation bilaterally, respirations unlabored  Heart:    Normal heart rate. Normal rhythm. No murmurs, rubs, or gallops.   MS:   All extremities are intact.   Neurologic:   Awake, alert, oriented x 3. No apparent focal neurological           defect.         Assessment & Plan     1. Primary hypertension Doing much better since starting benazepril. Still with some dizziness, but not nearly as pronounced as with previous medications. She does have some itching in her feet and legs at night and was advised to try the 10mg  hydroxyzine before going to bed.  - Comprehensive metabolic panel  2. Hypothyroidism, unspecified type  - TSH  3. Dizzinesses Improving.  - CBC  4. Hyperuricemia Given pt info on low purine diet.  - Uric acid       The entirety of the information documented in the History of Present Illness, Review of Systems and Physical Exam were personally obtained by me. Portions of this information were initially documented by the CMA and reviewed by me for thoroughness and accuracy.      Lelon Huh, MD  [facility] [phone] (phone) [phone] (fax)  [place]

## 2020-08-19 NOTE — Patient Instructions (Addendum)

## 2020-08-20 ENCOUNTER — Other Ambulatory Visit: Payer: Self-pay | Admitting: Family Medicine

## 2020-08-20 DIAGNOSIS — I10 Essential (primary) hypertension: Secondary | ICD-10-CM

## 2020-08-20 LAB — CBC
Hematocrit: 41.6 % (ref 34.0–46.6)
Hemoglobin: 13.7 g/dL (ref 11.1–15.9)
MCH: 30.5 pg (ref 26.6–33.0)
MCHC: 32.9 g/dL (ref 31.5–35.7)
MCV: 93 fL (ref 79–97)
Platelets: 325 10*3/uL (ref 150–450)
RBC: 4.49 x10E6/uL (ref 3.77–5.28)
RDW: 13.3 % (ref 11.7–15.4)
WBC: 8.1 10*3/uL (ref 3.4–10.8)

## 2020-08-20 LAB — COMPREHENSIVE METABOLIC PANEL
ALT: 9 IU/L (ref 0–32)
AST: 15 IU/L (ref 0–40)
Albumin/Globulin Ratio: 1.5 (ref 1.2–2.2)
Albumin: 3.8 g/dL (ref 3.6–4.6)
Alkaline Phosphatase: 77 IU/L (ref 44–121)
BUN/Creatinine Ratio: 21 (ref 12–28)
BUN: 23 mg/dL (ref 8–27)
Bilirubin Total: 0.3 mg/dL (ref 0.0–1.2)
CO2: 22 mmol/L (ref 20–29)
Calcium: 9.3 mg/dL (ref 8.7–10.3)
Chloride: 107 mmol/L — ABNORMAL HIGH (ref 96–106)
Creatinine, Ser: 1.1 mg/dL — ABNORMAL HIGH (ref 0.57–1.00)
GFR calc Af Amer: 53 mL/min/{1.73_m2} — ABNORMAL LOW (ref >59)
GFR calc non Af Amer: 46 mL/min/{1.73_m2} — ABNORMAL LOW (ref >59)
Globulin, Total: 2.6 g/dL (ref 1.5–4.5)
Glucose: 103 mg/dL — ABNORMAL HIGH (ref 65–99)
Potassium: 4.3 mmol/L (ref 3.5–5.2)
Sodium: 142 mmol/L (ref 134–144)
Total Protein: 6.4 g/dL (ref 6.0–8.5)

## 2020-08-20 LAB — TSH: TSH: 2.47 u[IU]/mL (ref 0.450–4.500)

## 2020-08-20 LAB — URIC ACID: Uric Acid: 8 mg/dL — ABNORMAL HIGH (ref 3.1–7.9)

## 2020-08-20 MED ORDER — BENAZEPRIL HCL 10 MG PO TABS: 10.0000 mg | ORAL_TABLET | Freq: Every day | ORAL | 2 refills | Status: DC

## 2020-09-11 ENCOUNTER — Ambulatory Visit: Payer: Self-pay | Admitting: Family Medicine

## 2020-09-11 NOTE — Telephone Encounter (Signed)
Patients husband advised and verbalized understanding.

## 2020-09-11 NOTE — Telephone Encounter (Signed)
Warm call transfer. Pt gave permission to speak to husband, Ronalee Belts.  Pt has ben taking Lotensin 5 mg for 3-4 weeks, and feels that it is causing pt's feet to itch. Pt already spoke to Dr. Caryn Section regarding this. He prescribed Hydroxyzine for itching. Pt states that this medication causes dry mouth and blurred vision.  Pt reports no other side effects. Pt denies any SOB, Swelling, rash or hives.  Pt states that she has been prescribed several different BP meds and each has had bothersome side effects. Pt is hoping that there is another BP medication that can be prescribed.  Reviewed POC sending note to Dr. Caryn Section and PT agreed.  Per protocol - Will send message to Dr. Caryn Section.  Reason for Disposition . [1] Caller has NON-URGENT medicine question about med that PCP prescribed AND [2] triager unable to answer question  Answer Assessment - Initial Assessment Questions 1. NAME of MEDICATION: "What medicine are you calling about?"     Lotensin  And hydroxyzine 2. QUESTION: "What is your question?" (e.g., medication refill, side effect)      Lotensin - Causes pt feet to itch.       Hydroxyzine - causes dry mouth and blurred vision  3. PRESCRIBING HCP: "Who prescribed it?" Reason: if prescribed by specialist, call should be referred to that group.    Dr. Caryn Section 4. SYMPTOMS: "Do you have any symptoms?"     Yes see above 5. SEVERITY: If symptoms are present, ask "Are they mild, moderate or severe?"     Mild 6. PREGNANCY:  "Is there any chance that you are pregnant?" "When was your last menstrual period?"     na  Protocols used: MEDICATION QUESTION CALL-A-AH

## 2020-09-11 NOTE — Telephone Encounter (Signed)
Try OTC levocetirizine (Xyzal) 5mg  one tablet daily for the itching. She should take it at the same time that she takes the benazepril

## 2020-10-01 NOTE — Progress Notes (Unsigned)
Subjective:   Kayla Chan is a 87 y.o. female who presents for Medicare Annual (Subsequent) preventive examination.  I connected with Eiza Canniff (husband - ok per DPR) today by telephone and verified that I am speaking with the correct person using two identifiers. Location patient: home Location provider: work Persons participating in the virtual visit: patient, provider.   I discussed the limitations, risks, security and privacy concerns of performing an evaluation and management service by telephone and the availability of in person appointments. I also discussed with the patient that there may be a patient responsible charge related to this service. The patient expressed understanding and verbally consented to this telephonic visit.    Interactive audio and video telecommunications were attempted between this provider and patient, however failed, due to patient having technical difficulties OR patient did not have access to video capability.  We continued and completed visit with audio only.   Review of Systems    N/A  Cardiac Risk Factors include: advanced age (>39men, >65 women);obesity (BMI >30kg/m2);dyslipidemia;hypertension     Objective:    There were no vitals filed for this visit. There is no height or weight on file to calculate BMI.  Advanced Directives 10/02/2020 01/05/2020 09/26/2019 09/21/2018 10/19/2017 09/03/2017 09/03/2017  Does Patient Have a Medical Advance Directive? No No No Yes Yes No No  Type of Advance Directive - - Public librarian;Living will [place];Living will - -  Does patient want to make changes to medical advance directive? - - - - No - Patient declined - -  Copy of [place] in Chart? - - - No - copy requested No - copy requested - -  Would patient like information on creating a medical advance directive? No - Patient declined - - - - Yes (MAU/Ambulatory/Procedural Areas - Information given) No -  Patient declined    Current Medications (verified) Outpatient Encounter Medications as of 10/02/2020  Medication Sig  . allopurinol (ZYLOPRIM) 100 MG tablet Take 100 mg by mouth daily.  . Artificial Tear Ointment (DRY EYES OP) Place 1 drop into the left eye as needed.  Marland Kitchen aspirin 81 MG tablet Take 81 mg by mouth daily.  . benazepril (LOTENSIN) 10 MG tablet Take 1 tablet (10 mg total) by mouth daily.  . colchicine 0.6 MG tablet Take 1 tablet (0.6 mg total) by mouth daily as needed (gout).  Marland Kitchen diclofenac (CATAFLAM) 50 MG tablet TAKE 1 TABLET (50 MG TOTAL) BY MOUTH 2 (TWO) TIMES DAILY AS NEEDED (FOOT PAIN).  Marland Kitchen ezetimibe (ZETIA) 10 MG tablet TAKE 1 TABLET BY MOUTH EVERY DAY IN THE MORNING  . furosemide (LASIX) 20 MG tablet Take 0.5 tablets (10 mg total) by mouth in the morning.  . hydrocortisone 2.5 % lotion Apply to face once daily as needed for itchiness  . hydrOXYzine (ATARAX/VISTARIL) 10 MG tablet Take 1 tablet (10 mg total) by mouth every 8 (eight) hours as needed for itching.  . levothyroxine (SYNTHROID) 75 MCG tablet TAKE 1 TABLET BY MOUTH EVERY DAY  . mometasone (ELOCON) 0.1 % ointment APPLY 1 APPLICATION DAILY AS NEEDED TOPICALLY. APPLY TO AFFECTED AREA  . Multiple Vitamins-Minerals (MULTIVITAMIN ADULT PO) Take 1 tablet by mouth daily.  . tacrolimus (PROTOPIC) 0.1 % ointment Apply to itching on face twice daily  . ketoconazole (NIZORAL) 2 % shampoo Apply topically.  (Patient not taking: Reported on 10/02/2020)  . meloxicam (MOBIC) 15 MG tablet Take 1 tablet (15 mg total) by mouth  daily. (Patient not taking: Reported on 10/02/2020)  . pimecrolimus (ELIDEL) 1 % cream Apply topically 2 (two) times daily. (Patient not taking: Reported on 10/02/2020)  . sulfacetamide (BLEPH-10) 10 % ophthalmic solution Place 1 drop into the right eye every 4 (four) hours. (Patient not taking: Reported on 10/02/2020)   No facility-administered encounter medications on file as of 10/02/2020.    Allergies  (verified) Lisinopril, Amlodipine, Atorvastatin, Colestid  [colestipol hcl], Crestor  [rosuvastatin calcium], Doxycycline, Fluvastatin sodium, Furosemide, Losartan, Pravastatin sodium, Verapamil, Allopurinol, and Penicillins   History: Past Medical History:  Diagnosis Date  . Cancer ([place])    BCC on nose  . Hyperlipidemia   . Hypertension    Past Surgical History:  Procedure Laterality Date  . 24 hour Holter Monitor  01/2013   4550 ectopic beats with 344 superventricular bigemminy events  . Abdominal Ultrasound  05/31/2013   RUQ. Fatty Liver  . APPENDECTOMY  1950   [place]  . 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, [place]; Right   Family History  Problem Relation Age of Onset  . Stroke Mother        possible stroke  . Heart Problems Father   . Kidney cancer Sister   . Bladder Cancer Brother    Social History   Socioeconomic History  . Marital status: Married    Spouse name: Not on file  . Number of children: 0  . Years of education: Not on file  . Highest education level: Some college, no degree  Occupational History  . Occupation: Retired  Tobacco Use  . Smoking status: Never Smoker  . Smokeless tobacco: Never Used  Vaping Use  . Vaping Use: Never used  Substance and Sexual Activity  . Alcohol use: Yes    Alcohol/week: 0.0 standard drinks    Comment: 1 glass of wine seldom  . Drug use: No  . Sexual activity: Not on file  Other Topics Concern  . Not on file  Social History Narrative  . Not on file   Social Determinants of Health   Financial Resource Strain: Low Risk   . Difficulty of Paying Living Expenses: Not hard at all  Food Insecurity: No Food Insecurity  . Worried About Charity fundraiser in the Last Year: Never true  . Ran Out of Food in the Last Year: Never true  Transportation Needs: No Transportation Needs  . Lack of Transportation (Medical): No  . Lack of Transportation (Non-Medical): No  Physical Activity:  Inactive  . Days of Exercise per Week: 0 days  . Minutes of Exercise per Session: 0 min  Stress: No Stress Concern Present  . Feeling of Stress : Not at all  Social Connections: Moderately Integrated  . Frequency of Communication with Friends and Family: More than three times a week  . Frequency of Social Gatherings with Friends and Family: More than three times a week  . Attends Religious Services: More than 4 times per year  . Active Member of Clubs or Organizations: No  . Attends Archivist Meetings: Never  . Marital Status: Married    Tobacco Counseling Counseling given: Not Answered   Clinical Intake:  Pre-visit preparation completed: Yes  Pain : No/denies pain (Has chronic joint pain.)     Nutritional Risks: None Diabetes: No  How often do you need to have someone help you when you read instructions, pamphlets, or other written materials from your doctor or pharmacy?: 1 - Never  Diabetic? No  Interpreter Needed?: No  Information entered by :: [facility], LPN   Activities of Daily Living In your present state of health, do you have any difficulty performing the following activities: 10/02/2020  Hearing? Y  Comment Wears bilateral hearing aids.  Vision? N  Difficulty concentrating or making decisions? N  Walking or climbing stairs? Y  Comment Due to knee pains.  Dressing or bathing? N  Doing errands, shopping? N  Preparing Food and eating ? N  Using the Toilet? N  In the past six months, have you accidently leaked urine? N  Do you have problems with loss of bowel control? N  Managing your Medications? N  Managing your Finances? N  Housekeeping or managing your Housekeeping? N  Some recent data might be hidden    Patient Care Team: Birdie Sons, MD as PCP - General (Family Medicine) Regino Schultze, MD as Referring Physician (Dermatology) Birder Robson, MD as Referring Physician (Ophthalmology) Hessie Knows, MD as Consulting Physician  (Orthopedic Surgery)  Indicate any recent Medical Services you may have received from other than [facility] providers in the past year (date may be approximate).     Assessment:   This is a routine wellness examination for Kayla Chan.  Hearing/Vision screen No exam data present  Dietary issues and exercise activities discussed: Current Exercise Habits: Home exercise routine, Type of exercise: stretching (PT exercises), Time (Minutes): 20, Frequency (Times/Week): 7, Weekly Exercise (Minutes/Week): 140, Intensity: Mild, Exercise limited by: orthopedic condition(s)  Goals    . Cut back on carbohydrates     Recommend cutting back on the bad carbohydrates and focusing on eating more healthy carbohydrates. Avoid eating large consumption's of bread and butter.     . Increase water intake     Recommend to increase water intake to 6-8 8 oz glasses a day.       Depression Screen PHQ 2/9 Scores 10/02/2020 09/26/2019 10/21/2018 09/21/2018 09/21/2018 09/03/2017 09/03/2017  PHQ - 2 Score 1 0 0 0 0 0 0  PHQ- 9 Score - - - 0 - 1 -    Fall Risk Fall Risk  10/02/2020 09/26/2019 10/21/2018 09/21/2018 09/03/2017  Falls in the past year? 0 1 0 0 No  Number falls in past yr: 0 1 - - -  Injury with Fall? 0 1 - - -  Risk for fall due to : - Impaired balance/gait;Impaired mobility - - -  Follow up - Falls prevention discussed - - -    FALL RISK PREVENTION PERTAINING TO THE HOME:  Any stairs in or around the home? Yes  If so, are there any without handrails? No  Home free of loose throw rugs in walkways, pet beds, electrical cords, etc? Yes  Adequate lighting in your home to reduce risk of falls? Yes   ASSISTIVE DEVICES UTILIZED TO PREVENT FALLS:  Life alert? No  Use of a cane, walker or w/c? Yes  Grab bars in the bathroom? Yes  Shower chair or bench in shower? Yes  Elevated toilet seat or a handicapped toilet? Yes    Cognitive Function: Unable to complete due to speaking with husband.     6CIT Screen  09/21/2018 08/19/2016  What Year? 0 points 0 points  What month? 0 points 0 points  What time? 0 points 0 points  Count back from 20 0 points 0 points  Months in reverse 4 points 0 points  Repeat phrase 6 points 4 points  Total Score 10 4    Immunizations Immunization History  Administered Date(s) Administered  . Fluad Quad(high Dose 65+) 05/29/2019, 05/21/2020  . Influenza Split 06/11/2007, 06/09/2008, 05/30/2009  . Influenza, High Dose Seasonal PF 05/31/2015, 05/30/2016, 06/10/2017, 07/02/2018  . Influenza,inj,Quad PF,6+ Mos 06/20/2013, 05/09/2014  . PFIZER(Purple Top)SARS-COV-2 Vaccination 09/13/2019, 10/07/2019, 06/05/2020  . Pneumococcal Conjugate-13 05/09/2014  . Pneumococcal Polysaccharide-23 05/24/2006    TDAP status: Due, Education has been provided regarding the importance of this vaccine. Advised may receive this vaccine at local pharmacy or Health Dept. Aware to provide a copy of the vaccination record if obtained from local pharmacy or Health Dept. Verbalized acceptance and understanding.  Flu Vaccine status: Up to date  Pneumococcal vaccine status: Up to date  Covid-19 vaccine status: Completed vaccines  Qualifies for Shingles Vaccine? Yes   Zostavax completed No   Shingrix Completed?: No.    Education has been provided regarding the importance of this vaccine. Patient has been advised to call insurance company to determine out of pocket expense if they have not yet received this vaccine. Advised may also receive vaccine at local pharmacy or Health Dept. Verbalized acceptance and understanding.  Screening Tests Health Maintenance  Topic Date Due  . TETANUS/TDAP  10/02/2021 (Originally 05/07/1951)  . DEXA SCAN  08/19/2026 (Originally 05/08/1999)  . COVID-19 Vaccine (4 - Booster for Pfizer series) 12/04/2020  . INFLUENZA VACCINE  Completed  . PNA vac Low Risk Adult  Completed    Health Maintenance  There are no preventive care reminders to display for this  patient.  Colorectal cancer screening: No longer required.   Mammogram status: No longer required due to age.  Bone Density status: Currently due, declined order.  Lung Cancer Screening: (Low Dose CT Chest recommended if Age 17-80 years, 30 pack-year currently smoking OR have quit w/in 15years.) does not qualify.   Additional Screening:  Vision Screening: Recommended annual ophthalmology exams for early detection of glaucoma and other disorders of the eye. Is the patient up to date with their annual eye exam?  Yes  Who is the provider or what is the name of the office in which the patient attends annual eye exams? Dr George Ina @ [facility]. If pt is not established with a provider, would they like to be referred to a provider to establish care? No .   Dental Screening: Recommended annual dental exams for proper oral hygiene  Community Resource Referral / Chronic Care Management: CRR required this visit?  No   CCM required this visit?  No      Plan:     I have personally reviewed and noted the following in the patient's chart:   . Medical and social history . Use of alcohol, tobacco or illicit drugs  . Current medications and supplements . Functional ability and status . Nutritional status . Physical activity . Advanced directives . List of other physicians . Hospitalizations, surgeries, and ER visits in previous 12 months . Vitals . Screenings to include cognitive, depression, and falls . Referrals and appointments  In addition, I have reviewed and discussed with patient certain preventive protocols, quality metrics, and best practice recommendations. A written personalized care plan for preventive services as well as general preventive health recommendations were provided to patient.     Didier Brandenburg [place], [place]   08/27/4313   Nurse Notes: Declined a DEXA scan order.

## 2020-10-02 ENCOUNTER — Other Ambulatory Visit: Payer: Self-pay

## 2020-10-02 ENCOUNTER — Ambulatory Visit (INDEPENDENT_AMBULATORY_CARE_PROVIDER_SITE_OTHER): Payer: PPO

## 2020-10-02 DIAGNOSIS — Z0000 Encounter for general adult medical examination without abnormal findings: Secondary | ICD-10-CM

## 2020-10-02 NOTE — Patient Instructions (Signed)
Kayla Chan , Thank you for taking time to come for your Medicare Wellness Visit. I appreciate your ongoing commitment to your health goals. Please review the following plan we discussed and let me know if I can assist you in the future.   Screening recommendations/referrals: Colonoscopy: No longer required.  Mammogram: No longer required.  Bone Density: Currently due, declined order.  Recommended yearly ophthalmology/optometry visit for glaucoma screening and checkup Recommended yearly dental visit for hygiene and checkup  Vaccinations: Influenza vaccine: Done 05/21/20 Pneumococcal vaccine: Completed series Tdap vaccine: Currently due, declined receiving. Shingles vaccine: Shingrix discussed. Please contact your pharmacy for coverage information.     Advanced directives: Advance directive discussed with you today. I will mail you a copy to complete at home and have notarized. Once this is complete please bring a copy in to our office so we can scan it into your chart.  Conditions/risks identified: Recommend to increase water intake to 6-8 8 oz glasses a day.  Next appointment: 11/11/20 @ 1:40 PM with Dr Caryn Section    Preventive Care 16 Years and Older, Female Preventive care refers to lifestyle choices and visits with your health care provider that can promote health and wellness. What does preventive care include?  A yearly physical exam. This is also called an annual well check.  Dental exams once or twice a year.  Routine eye exams. Ask your health care provider how often you should have your eyes checked.  Personal lifestyle choices, including:  Daily care of your teeth and gums.  Regular physical activity.  Eating a healthy diet.  Avoiding tobacco and drug use.  Limiting alcohol use.  Practicing safe sex.  Taking low-dose aspirin every day.  Taking vitamin and mineral supplements as recommended by your health care provider. What happens during an annual well  check? The services and screenings done by your health care provider during your annual well check will depend on your age, overall health, lifestyle risk factors, and family history of disease. Counseling  Your health care provider may ask you questions about your:  Alcohol use.  Tobacco use.  Drug use.  Emotional well-being.  Home and relationship well-being.  Sexual activity.  Eating habits.  History of falls.  Memory and ability to understand (cognition).  Work and work Statistician.  Reproductive health. Screening  You may have the following tests or measurements:  Height, weight, and BMI.  Blood pressure.  Lipid and cholesterol levels. These may be checked every 5 years, or more frequently if you are over 17 years old.  Skin check.  Lung cancer screening. You may have this screening every year starting at age 18 if you have a 30-pack-year history of smoking and currently smoke or have quit within the past 15 years.  Fecal occult blood test (FOBT) of the stool. You may have this test every year starting at age 61.  Flexible sigmoidoscopy or colonoscopy. You may have a sigmoidoscopy every 5 years or a colonoscopy every 10 years starting at age 29.  Hepatitis C blood test.  Hepatitis B blood test.  Sexually transmitted disease (STD) testing.  Diabetes screening. This is done by checking your blood sugar (glucose) after you have not eaten for a while (fasting). You may have this done every 1-3 years.  Bone density scan. This is done to screen for osteoporosis. You may have this done starting at age 54.  Mammogram. This may be done every 1-2 years. Talk to your health care provider about how often  you should have regular mammograms. Talk with your health care provider about your test results, treatment options, and if necessary, the need for more tests. Vaccines  Your health care provider may recommend certain vaccines, such as:  Influenza vaccine. This is  recommended every year.  Tetanus, diphtheria, and acellular pertussis (Tdap, Td) vaccine. You may need a Td booster every 10 years.  Zoster vaccine. You may need this after age 61.  Pneumococcal 13-valent conjugate (PCV13) vaccine. One dose is recommended after age 62.  Pneumococcal polysaccharide (PPSV23) vaccine. One dose is recommended after age 23. Talk to your health care provider about which screenings and vaccines you need and how often you need them. This information is not intended to replace advice given to you by your health care provider. Make sure you discuss any questions you have with your health care provider. Document Released: 09/06/2015 Document Revised: 04/29/2016 Document Reviewed: 06/11/2015 Elsevier Interactive Patient Education  2017 [place] Prevention in the Home Falls can cause injuries. They can happen to people of all ages. There are many things you can do to make your home safe and to help prevent falls. What can I do on the outside of my home?  Regularly fix the edges of walkways and driveways and fix any cracks.  Remove anything that might make you trip as you walk through a door, such as a raised step or threshold.  Trim any bushes or trees on the path to your home.  Use bright outdoor lighting.  Clear any walking paths of anything that might make someone trip, such as rocks or tools.  Regularly check to see if handrails are loose or broken. Make sure that both sides of any steps have handrails.  Any raised decks and porches should have guardrails on the edges.  Have any leaves, snow, or ice cleared regularly.  Use sand or salt on walking paths during winter.  Clean up any spills in your garage right away. This includes oil or grease spills. What can I do in the bathroom?  Use night lights.  Install grab bars by the toilet and in the tub and shower. Do not use towel bars as grab bars.  Use non-skid mats or decals in the tub or  shower.  If you need to sit down in the shower, use a plastic, non-slip stool.  Keep the floor dry. Clean up any water that spills on the floor as soon as it happens.  Remove soap buildup in the tub or shower regularly.  Attach bath mats securely with double-sided non-slip rug tape.  Do not have throw rugs and other things on the floor that can make you trip. What can I do in the bedroom?  Use night lights.  Make sure that you have a light by your bed that is easy to reach.  Do not use any sheets or blankets that are too big for your bed. They should not hang down onto the floor.  Have a firm chair that has side arms. You can use this for support while you get dressed.  Do not have throw rugs and other things on the floor that can make you trip. What can I do in the kitchen?  Clean up any spills right away.  Avoid walking on wet floors.  Keep items that you use a lot in easy-to-reach places.  If you need to reach something above you, use a strong step stool that has a grab bar.  Keep electrical cords  out of the way.  Do not use floor polish or wax that makes floors slippery. If you must use wax, use non-skid floor wax.  Do not have throw rugs and other things on the floor that can make you trip. What can I do with my stairs?  Do not leave any items on the stairs.  Make sure that there are handrails on both sides of the stairs and use them. Fix handrails that are broken or loose. Make sure that handrails are as long as the stairways.  Check any carpeting to make sure that it is firmly attached to the stairs. Fix any carpet that is loose or worn.  Avoid having throw rugs at the top or bottom of the stairs. If you do have throw rugs, attach them to the floor with carpet tape.  Make sure that you have a light switch at the top of the stairs and the bottom of the stairs. If you do not have them, ask someone to add them for you. What else can I do to help prevent  falls?  Wear shoes that:  Do not have high heels.  Have rubber bottoms.  Are comfortable and fit you well.  Are closed at the toe. Do not wear sandals.  If you use a stepladder:  Make sure that it is fully opened. Do not climb a closed stepladder.  Make sure that both sides of the stepladder are locked into place.  Ask someone to hold it for you, if possible.  Clearly mark and make sure that you can see:  Any grab bars or handrails.  First and last steps.  Where the edge of each step is.  Use tools that help you move around (mobility aids) if they are needed. These include:  Canes.  Walkers.  Scooters.  Crutches.  Turn on the lights when you go into a dark area. Replace any light bulbs as soon as they burn out.  Set up your furniture so you have a clear path. Avoid moving your furniture around.  If any of your floors are uneven, fix them.  If there are any pets around you, be aware of where they are.  Review your medicines with your doctor. Some medicines can make you feel dizzy. This can increase your chance of falling. Ask your doctor what other things that you can do to help prevent falls. This information is not intended to replace advice given to you by your health care provider. Make sure you discuss any questions you have with your health care provider. Document Released: 06/06/2009 Document Revised: 01/16/2016 Document Reviewed: 09/14/2014 Elsevier Interactive Patient Education  2017 [employer].

## 2020-10-08 DIAGNOSIS — M3501 Sicca syndrome with keratoconjunctivitis: Secondary | ICD-10-CM | POA: Diagnosis not present

## 2020-10-21 ENCOUNTER — Other Ambulatory Visit: Payer: Self-pay | Admitting: Family Medicine

## 2020-10-21 DIAGNOSIS — M10279 Drug-induced gout, unspecified ankle and foot: Secondary | ICD-10-CM

## 2020-10-21 NOTE — Telephone Encounter (Signed)
  Notes to clinic: medication was filled by a historical provider  Review for continued use and refill   Requested Prescriptions  Pending Prescriptions Disp Refills   allopurinol (ZYLOPRIM) 100 MG tablet [Pharmacy Med Name: ALLOPURINOL 100 MG TABLET] 90 tablet 2    Sig: TAKE 1 TABLET BY [place]      Endocrinology:  Gout Agents Failed - 10/21/2020  1:34 AM      Failed - Uric Acid in normal range and within 360 days    Uric Acid  Date Value Ref Range Status  08/19/2020 8.0 (H) 3.1 - 7.9 mg/dL Final    Comment:               Therapeutic target for gout patients: <6.0          Failed - Cr in normal range and within 360 days    Creatinine  Date Value Ref Range Status  08/05/2013 1.10 0.60 - 1.30 mg/dL Final   Creatinine, Ser  Date Value Ref Range Status  08/19/2020 1.10 (H) 0.57 - 1.00 mg/dL Final          Passed - Valid encounter within last 12 months    Recent Outpatient Visits           2 months ago Primary hypertension   [facility] Birdie Sons, MD   3 months ago Primary hypertension   [facility] Birdie Sons, MD   3 months ago Primary hypertension   [facility] Birdie Sons, MD   3 months ago Primary hypertension   [facility] Birdie Sons, MD   5 months ago [place], Kirstie Peri, MD       Future Appointments             In 3 weeks Fisher, Kirstie Peri, MD [facility], [place]

## 2020-10-28 ENCOUNTER — Other Ambulatory Visit: Payer: Self-pay | Admitting: Family Medicine

## 2020-10-28 DIAGNOSIS — E785 Hyperlipidemia, unspecified: Secondary | ICD-10-CM

## 2020-10-28 DIAGNOSIS — E039 Hypothyroidism, unspecified: Secondary | ICD-10-CM

## 2020-11-05 ENCOUNTER — Other Ambulatory Visit: Payer: Self-pay

## 2020-11-05 ENCOUNTER — Ambulatory Visit (INDEPENDENT_AMBULATORY_CARE_PROVIDER_SITE_OTHER): Payer: PPO | Admitting: Physician Assistant

## 2020-11-05 VITALS — BP 179/78 | HR 92 | Temp 97.7°F | Wt 237.0 lb

## 2020-11-05 DIAGNOSIS — K5900 Constipation, unspecified: Secondary | ICD-10-CM | POA: Diagnosis not present

## 2020-11-05 DIAGNOSIS — G8929 Other chronic pain: Secondary | ICD-10-CM | POA: Diagnosis not present

## 2020-11-05 DIAGNOSIS — M1990 Unspecified osteoarthritis, unspecified site: Secondary | ICD-10-CM | POA: Diagnosis not present

## 2020-11-05 DIAGNOSIS — M25561 Pain in right knee: Secondary | ICD-10-CM | POA: Diagnosis not present

## 2020-11-05 MED ORDER — DICLOFENAC SODIUM 1 % EX GEL
2.0000 g | Freq: Four times a day (QID) | CUTANEOUS | 0 refills | Status: DC
Start: 2020-11-05 — End: 2022-01-08

## 2020-11-05 NOTE — Progress Notes (Signed)
Established patient visit   Patient: Kayla Chan   DOB: 04-Jun-1932   87 y.o. Female  MRN: [id number] Visit Date: 11/05/2020  Today's healthcare provider: Trinna Post, PA-C   Chief Complaint  Patient presents with  . Constipation   Subjective    HPI   Patient is an 87 year female who presents for issue with constipation and acute on chronic leg and knee pain bilaterally.  She states that she thinks all the straining from being constipated has caused her back to flare and this has in turn caused more pain in her legs and knees.  It has been bad over the last 3 days and especially last night. She reports eating a variety of foods daily. She reports eating a german bread, vegetables, little red meat due to gout. She reports she took dulcolax chewable which hurt her stomach. She has not tried miralax.   She reports acute on chronic right knee pain, history of TKR in this knee. She reports she took meloxicam which was helpful with pain however this made her dizzy. She has an appointment with orthopedics on 11/20/2020. Reports pain is unbearable and she can't sleep.   Medications: Outpatient Medications Prior to Visit  Medication Sig  . allopurinol (ZYLOPRIM) 100 MG tablet Take 1 tablet (100 mg total) by mouth daily.  . Artificial Tear Ointment (DRY EYES OP) Place 1 drop into the left eye as needed.  Marland Kitchen aspirin 81 MG tablet Take 81 mg by mouth daily.  . benazepril (LOTENSIN) 10 MG tablet Take 1 tablet (10 mg total) by mouth daily.  . colchicine 0.6 MG tablet Take 1 tablet (0.6 mg total) by mouth daily as needed (gout).  Marland Kitchen diclofenac (CATAFLAM) 50 MG tablet TAKE 1 TABLET (50 MG TOTAL) BY MOUTH 2 (TWO) TIMES DAILY AS NEEDED (FOOT PAIN).  Marland Kitchen ezetimibe (ZETIA) 10 MG tablet TAKE 1 TABLET BY MOUTH EVERY DAY IN THE MORNING  . furosemide (LASIX) 20 MG tablet Take 0.5 tablets (10 mg total) by mouth in the morning.  . hydrocortisone 2.5 % lotion Apply to face once daily as needed for  itchiness  . hydrOXYzine (ATARAX/VISTARIL) 10 MG tablet Take 1 tablet (10 mg total) by mouth every 8 (eight) hours as needed for itching.  Marland Kitchen ketoconazole (NIZORAL) 2 % shampoo Apply topically.  Marland Kitchen levothyroxine (SYNTHROID) 75 MCG tablet TAKE 1 TABLET BY MOUTH EVERY DAY  . meloxicam (MOBIC) 15 MG tablet Take 1 tablet (15 mg total) by mouth daily.  . mometasone (ELOCON) 0.1 % ointment APPLY 1 APPLICATION DAILY AS NEEDED TOPICALLY. APPLY TO AFFECTED AREA  . Multiple Vitamins-Minerals (MULTIVITAMIN ADULT PO) Take 1 tablet by mouth daily.  . pimecrolimus (ELIDEL) 1 % cream Apply topically 2 (two) times daily.  Marland Kitchen sulfacetamide (BLEPH-10) 10 % ophthalmic solution Place 1 drop into the right eye every 4 (four) hours.  . tacrolimus (PROTOPIC) 0.1 % ointment Apply to itching on face twice daily   No facility-administered medications prior to visit.    Review of Systems  Gastrointestinal: Positive for constipation.  Musculoskeletal: Positive for arthralgias, back pain, gait problem, joint swelling and myalgias.       Objective    BP (!) 179/78 (BP Location: Left Wrist, Patient Position: Sitting, Cuff Size: Large)   Pulse 92   Temp 97.7 F (36.5 C) (Oral)   Wt 237 lb (107.5 kg)   SpO2 97%   BMI 46.29 kg/m     Physical Exam Constitutional:  Appearance: Normal appearance. She is obese.  Cardiovascular:     Rate and Rhythm: Normal rate and regular rhythm.     Heart sounds: Normal heart sounds.  Pulmonary:     Effort: Pulmonary effort is normal.     Breath sounds: Normal breath sounds.  Abdominal:     General: Bowel sounds are normal. There is no distension.     Palpations: Abdomen is soft.     Tenderness: There is no abdominal tenderness.  Skin:    General: Skin is warm and dry.  Neurological:     Mental Status: She is alert and oriented to person, place, and time. Mental status is at baseline.  Psychiatric:        Mood and Affect: Mood normal.        Behavior: Behavior normal.        No results found for any visits on 11/05/20.  Assessment & Plan    1. Constipation, unspecified constipation type  Suggest one capful of miralax a day. Explained she may experience some abdominal discomfort as the medicine works. Advised to increase fiber. May consider linzess if miralax unsuccessful.   2. Osteoarthritis, unspecified osteoarthritis type, unspecified site  Will try topical gel to avoid side effects. She has follow up with orthopedics on 3/30.  - diclofenac Sodium (VOLTAREN) 1 % GEL; Apply 2 g topically 4 (four) times daily.  Dispense: 100 g; Refill: 0  3. Chronic pain of right knee  - diclofenac Sodium (VOLTAREN) 1 % GEL; Apply 2 g topically 4 (four) times daily.  Dispense: 100 g; Refill: 0   Return if symptoms worsen or fail to improve.      ITrinna Post, PA-C, have reviewed all documentation for this visit. The documentation on 11/06/20 for the exam, diagnosis, procedures, and orders are all accurate and complete.  The entirety of the information documented in the History of Present Illness, Review of Systems and Physical Exam were personally obtained by me. Portions of this information were initially documented by Althea Charon, CMA and reviewed by me for thoroughness and accuracy.        Paulene Floor  [facility] [phone] (phone) [phone] (fax)  [place]

## 2020-11-05 NOTE — Patient Instructions (Addendum)
Take one capful of miralax daily

## 2020-11-07 ENCOUNTER — Ambulatory Visit: Payer: Self-pay | Admitting: *Deleted

## 2020-11-07 NOTE — Telephone Encounter (Signed)
Pt and her husband called stating she is having right leg pain after taking her Benazepril ; this has been happening for the past  week; the pain is from her right hip to heel that occurs 3 hours after taking her medication; they both state the pt does not have pain if she does not take Benazepril; the pt also says she does not have pain during the day; the pt's husband gave her Labetalol from a previous prescription last pm; pt's husband advised not to give medication that has been discontinued; she takes Tylenol x 1 mg tablet and Diclofenac twice daily prn pain; the pt has used her Voltaren gel and did not get relief from her symptoms; the pt's husband says they are not able to come in for a visit; he also says this was briefly discussed at her visit on 11/05/20; they would like for this to be reviewed by Dr Caryn Section and be called back to discuss; they can be contacted at [phone]; will route to office for provider review.  Reason for Disposition . [1] MODERATE pain (e.g., interferes with normal activities, limping) AND [2] present > 3 days  Answer Assessment - Initial Assessment Questions 1. ONSET: "When did the pain start?"     1 week ago 2. LOCATION: "Where is the pain located?"      Right leg (hip to heel) 3. PAIN: "How bad is the pain?"    (Scale 1-10; or mild, moderate, severe)   -  MILD (1-3): doesn't interfere with normal activities    -  MODERATE (4-7): interferes with normal activities (e.g., work or school) or awakens from sleep, limping    -  SEVERE (8-10): excruciating pain, unable to do any normal activities, unable to walk   Rated 10 out of 10 4. WORK OR EXERCISE: "Has there been any recent work or exercise that involved this part of the body?"      no 5. CAUSE: "What do you think is causing the leg pain?"     benzepril 6. OTHER SYMPTOMS: "Do you have any other symptoms?" (e.g., chest pain, back pain, breathing difficulty, swelling, rash, fever, numbness, weakness)    Intermittent right knee swelling; has fluid pill for this  7. PREGNANCY: "Is there any chance you are pregnant?" "When was your last menstrual period?"     no  Protocols used: LEG PAIN-A-AH

## 2020-11-07 NOTE — Telephone Encounter (Signed)
I doubt its the benazepril, never heard of that as a side effect and it would be very unusual to just start now since has been taking it a few months.  But if she wants to change back to labetalol she can. Looks like she used to take 1/2 tablet twice a day. Let me know if she needs refill for it.

## 2020-11-08 ENCOUNTER — Telehealth: Payer: Self-pay | Admitting: Family Medicine

## 2020-11-08 NOTE — Telephone Encounter (Signed)
Pt is calling [facility] to advise that she did take the medication at 7p. And the pain began at 2am-4am with severe pain. And she could not sleep.  Please advise Cb- [phone]

## 2020-11-11 ENCOUNTER — Ambulatory Visit: Payer: Self-pay | Admitting: Family Medicine

## 2020-11-20 DIAGNOSIS — T8484XA Pain due to internal orthopedic prosthetic devices, implants and grafts, initial encounter: Secondary | ICD-10-CM | POA: Diagnosis not present

## 2020-11-20 DIAGNOSIS — M25552 Pain in left hip: Secondary | ICD-10-CM | POA: Diagnosis not present

## 2020-11-20 DIAGNOSIS — M1611 Unilateral primary osteoarthritis, right hip: Secondary | ICD-10-CM | POA: Diagnosis not present

## 2020-11-20 DIAGNOSIS — M545 Low back pain, unspecified: Secondary | ICD-10-CM | POA: Diagnosis not present

## 2020-11-20 DIAGNOSIS — M25551 Pain in right hip: Secondary | ICD-10-CM | POA: Diagnosis not present

## 2020-11-20 DIAGNOSIS — Z96659 Presence of unspecified artificial knee joint: Secondary | ICD-10-CM | POA: Diagnosis not present

## 2020-11-25 ENCOUNTER — Telehealth: Payer: Self-pay

## 2020-11-25 NOTE — Telephone Encounter (Signed)
Copied from [place] [id number]. Topic: Appointment Scheduling - Scheduling Inquiry for Clinic >> Nov 25, 2020 11:52 AM Oneta Rack wrote: Patient was scheduled as a virtual for 1pm on 11/26/2020 with PCP please change to in office appt.

## 2020-11-26 ENCOUNTER — Ambulatory Visit (INDEPENDENT_AMBULATORY_CARE_PROVIDER_SITE_OTHER): Payer: PPO | Admitting: Family Medicine

## 2020-11-26 ENCOUNTER — Other Ambulatory Visit: Payer: Self-pay

## 2020-11-26 ENCOUNTER — Encounter: Payer: Self-pay | Admitting: Family Medicine

## 2020-11-26 ENCOUNTER — Telehealth: Payer: PPO | Admitting: Family Medicine

## 2020-11-26 VITALS — BP 150/81 | HR 84 | Temp 98.2°F | Resp 18 | Wt 241.0 lb

## 2020-11-26 DIAGNOSIS — R300 Dysuria: Secondary | ICD-10-CM | POA: Diagnosis not present

## 2020-11-26 DIAGNOSIS — I10 Essential (primary) hypertension: Secondary | ICD-10-CM | POA: Diagnosis not present

## 2020-11-26 DIAGNOSIS — M7989 Other specified soft tissue disorders: Secondary | ICD-10-CM

## 2020-11-26 DIAGNOSIS — K5900 Constipation, unspecified: Secondary | ICD-10-CM

## 2020-11-26 LAB — POCT URINALYSIS DIPSTICK
Bilirubin, UA: NEGATIVE
Blood, UA: NEGATIVE
Glucose, UA: NEGATIVE
Ketones, UA: NEGATIVE
Nitrite, UA: NEGATIVE
Protein, UA: NEGATIVE
Spec Grav, UA: 1.02 (ref 1.010–1.025)
Urobilinogen, UA: 0.2 E.U./dL
pH, UA: 5 (ref 5.0–8.0)

## 2020-11-26 MED ORDER — ALISKIREN FUMARATE 150 MG PO TABS: 150.0000 mg | ORAL_TABLET | Freq: Every day | ORAL | 1 refills | Status: DC

## 2020-11-26 NOTE — Patient Instructions (Addendum)
.   I sent in a prescription for aliskiren to take in place of benazepril   Apply warm compress to bruised are of leg and ankle 2-3 times a day   Try OTC milk of magnesia twice a day for constipaiton

## 2020-11-26 NOTE — Progress Notes (Signed)
Established patient visit   Patient: Kayla Chan   DOB: 22-Apr-1932   87 y.o. Female  MRN: [id number] Visit Date: 11/26/2020  Today's healthcare provider: Lelon Huh, MD   Chief Complaint  Patient presents with  . Leg Pain   Subjective    Leg Pain  Incident onset: 11/18/2020. The injury mechanism was a fall (fell backwards while trying to step up on a curb). Was a little sore for a few days and had previously scheduled appt with Dr. Rudene Christians on the 30th where she had xrays of hips right knee and spine which showed no acute changed. Two days later she lost balance on the curb at [employer] and seemed to have twisted her leg, after which she develop large purplish swollen area of right lower leg and ankle. The pain is present in the right leg (calf). Quality: pinching pain. Associated symptoms include an inability to bear weight. Pertinent negatives include no loss of motion, loss of sensation, muscle weakness, numbness or tingling. Associated symptoms comments: Swelling in the right calf. She has tried ice and heat (knee brace) for the symptoms. The treatment provided no relief.    Dysuria  This is a new problem. Episode onset: 1 week ago. The problem has been gradually improving. The quality of the pain is described as burning. Pertinent negatives include no chills, nausea or vomiting. Treatments tried: cranberry juice. The treatment provided moderate relief.    She also states that she has been extremely constipated every since starting benazepril and it makes her itch at night. Feels dizzy after she takes it, although not as much as other blood pressure medications.       Medications: Outpatient Medications Prior to Visit  Medication Sig  . allopurinol (ZYLOPRIM) 100 MG tablet Take 1 tablet (100 mg total) by mouth daily.  . Artificial Tear Ointment (DRY EYES OP) Place 1 drop into the left eye as needed.  Marland Kitchen aspirin 81 MG tablet Take 81 mg by mouth daily.  . benazepril  (LOTENSIN) 10 MG tablet Take 1 tablet (10 mg total) by mouth daily.  . colchicine 0.6 MG tablet Take 1 tablet (0.6 mg total) by mouth daily as needed (gout).  Marland Kitchen diclofenac (CATAFLAM) 50 MG tablet TAKE 1 TABLET (50 MG TOTAL) BY MOUTH 2 (TWO) TIMES DAILY AS NEEDED (FOOT PAIN).  Marland Kitchen diclofenac Sodium (VOLTAREN) 1 % GEL Apply 2 g topically 4 (four) times daily.  Marland Kitchen ezetimibe (ZETIA) 10 MG tablet TAKE 1 TABLET BY MOUTH EVERY DAY IN THE MORNING  . furosemide (LASIX) 20 MG tablet Take 0.5 tablets (10 mg total) by mouth in the morning.  . hydrocortisone 2.5 % lotion Apply to face once daily as needed for itchiness  . hydrOXYzine (ATARAX/VISTARIL) 10 MG tablet Take 1 tablet (10 mg total) by mouth every 8 (eight) hours as needed for itching.  Marland Kitchen ketoconazole (NIZORAL) 2 % shampoo Apply topically.  Marland Kitchen levothyroxine (SYNTHROID) 75 MCG tablet TAKE 1 TABLET BY MOUTH EVERY DAY  . meloxicam (MOBIC) 15 MG tablet Take 1 tablet (15 mg total) by mouth daily.  . mometasone (ELOCON) 0.1 % ointment APPLY 1 APPLICATION DAILY AS NEEDED TOPICALLY. APPLY TO AFFECTED AREA  . Multiple Vitamins-Minerals (MULTIVITAMIN ADULT PO) Take 1 tablet by mouth daily.  . pimecrolimus (ELIDEL) 1 % cream Apply topically 2 (two) times daily.  Marland Kitchen sulfacetamide (BLEPH-10) 10 % ophthalmic solution Place 1 drop into the right eye every 4 (four) hours.  . tacrolimus (PROTOPIC) 0.1 %  ointment Apply to itching on face twice daily   No facility-administered medications prior to visit.    Review of Systems  Constitutional: Negative for appetite change, chills, fatigue and fever.  Respiratory: Negative for chest tightness and shortness of breath.   Cardiovascular: Positive for leg swelling (right lower leg). Negative for chest pain and palpitations.  Gastrointestinal: Negative for abdominal pain, nausea and vomiting.  Endocrine: Positive for cold intolerance.  Genitourinary: Positive for dysuria.  Musculoskeletal: Positive for myalgias (right lower  leg).  Neurological: Positive for dizziness. Negative for tingling, weakness and numbness.  Hematological: Bruises/bleeds easily.       Objective    BP (!) 150/81 (BP Location: Right Wrist, Patient Position: Sitting, Cuff Size: Normal)   Pulse 84   Temp 98.2 F (36.8 C) (Temporal)   Resp 18   Wt 241 lb (109.3 kg)   BMI 47.07 kg/m     Physical Exam   2+ edema left leg and ankle, 3+ on right. Diffusely tender right medial and posterior lower leg and right ankle. Large violaceous are of right medial leg, right anterior and medial ankle.    Assessment & Plan     1. Right leg swelling Contusion versus DVT - US Venous Img Lower Unilateral Right; Future  2. Primary hypertension Self reported to every blood pressure she has ever taken. Now thinks that benazepril is causing itching, dizziness and constipation. Will try change to - aliskiren (TEKTURNA) 150 MG tablet; Take 1 tablet (150 mg total) by mouth daily.  Dispense: 30 tablet; Refill: 1  3. Constipation, unspecified constipation type Miralax not helping. Try MOM  4. Dysuria Normal u/a         The entirety of the information documented in the History of Present Illness, Review of Systems and Physical Exam were personally obtained by me. Portions of this information were initially documented by the CMA and reviewed by me for thoroughness and accuracy.      Lelon Huh, MD  [facility] [phone] (phone) [phone] (fax)  [place]

## 2020-11-27 ENCOUNTER — Ambulatory Visit
Admission: RE | Admit: 2020-11-27 | Discharge: 2020-11-27 | Disposition: A | Discharge status: Home / Self Care | Payer: PPO | Source: Ambulatory Visit | Attending: Family Medicine | Admitting: Family Medicine

## 2020-11-27 ENCOUNTER — Telehealth: Payer: Self-pay

## 2020-11-27 ENCOUNTER — Other Ambulatory Visit: Payer: Self-pay

## 2020-11-27 DIAGNOSIS — M7989 Other specified soft tissue disorders: Secondary | ICD-10-CM | POA: Diagnosis not present

## 2020-11-27 DIAGNOSIS — M79604 Pain in right leg: Secondary | ICD-10-CM | POA: Diagnosis not present

## 2020-11-27 NOTE — Telephone Encounter (Signed)
[place] report in chart awaiting provider review. Please advise.

## 2020-11-27 NOTE — Telephone Encounter (Signed)
Copied from [place] [id number]. Topic: General - Other >> Nov 27, 2020 10:51 AM Yvette Rack wrote: Reason for CRM: Pt stated she has not heard from anyone regarding the ultrasound. Pt requests call back

## 2020-11-29 ENCOUNTER — Other Ambulatory Visit: Payer: Self-pay | Admitting: Family Medicine

## 2020-11-29 MED ORDER — NAPROXEN 500 MG PO TABS: 500.0000 mg | ORAL_TABLET | Freq: Two times a day (BID) | ORAL | 0 refills | Status: DC | PRN

## 2020-11-29 MED ORDER — CIPROFLOXACIN HCL 500 MG PO TABS: 500.0000 mg | ORAL_TABLET | Freq: Two times a day (BID) | ORAL | 0 refills | Status: DC

## 2020-12-01 ENCOUNTER — Inpatient Hospital Stay
Admission: EM | Admit: 2020-12-01 | Discharge: 2020-12-05 | DRG: 603 | Disposition: A | Discharge status: Home Health | Payer: PPO | Attending: Internal Medicine | Admitting: Internal Medicine

## 2020-12-01 ENCOUNTER — Emergency Department: Payer: PPO

## 2020-12-01 ENCOUNTER — Other Ambulatory Visit: Payer: Self-pay

## 2020-12-01 DIAGNOSIS — R739 Hyperglycemia, unspecified: Secondary | ICD-10-CM | POA: Diagnosis not present

## 2020-12-01 DIAGNOSIS — Z823 Family history of stroke: Secondary | ICD-10-CM | POA: Diagnosis not present

## 2020-12-01 DIAGNOSIS — N1831 Chronic kidney disease, stage 3a: Secondary | ICD-10-CM

## 2020-12-01 DIAGNOSIS — E66813 Obesity, class 3: Secondary | ICD-10-CM

## 2020-12-01 DIAGNOSIS — Z85828 Personal history of other malignant neoplasm of skin: Secondary | ICD-10-CM | POA: Diagnosis not present

## 2020-12-01 DIAGNOSIS — L02611 Cutaneous abscess of right foot: Principal | ICD-10-CM | POA: Diagnosis present

## 2020-12-01 DIAGNOSIS — I129 Hypertensive chronic kidney disease with stage 1 through stage 4 chronic kidney disease, or unspecified chronic kidney disease: Secondary | ICD-10-CM | POA: Diagnosis not present

## 2020-12-01 DIAGNOSIS — M660 Rupture of popliteal cyst: Secondary | ICD-10-CM

## 2020-12-01 DIAGNOSIS — Z8051 Family history of malignant neoplasm of kidney: Secondary | ICD-10-CM

## 2020-12-01 DIAGNOSIS — Z8052 Family history of malignant neoplasm of bladder: Secondary | ICD-10-CM | POA: Diagnosis not present

## 2020-12-01 DIAGNOSIS — Z79899 Other long term (current) drug therapy: Secondary | ICD-10-CM | POA: Diagnosis not present

## 2020-12-01 DIAGNOSIS — Z888 Allergy status to other drugs, medicaments and biological substances status: Secondary | ICD-10-CM | POA: Diagnosis not present

## 2020-12-01 DIAGNOSIS — R609 Edema, unspecified: Secondary | ICD-10-CM

## 2020-12-01 DIAGNOSIS — Z6841 Body Mass Index (BMI) 40.0 and over, adult: Secondary | ICD-10-CM

## 2020-12-01 DIAGNOSIS — M7121 Synovial cyst of popliteal space [Baker], right knee: Secondary | ICD-10-CM | POA: Diagnosis not present

## 2020-12-01 DIAGNOSIS — E039 Hypothyroidism, unspecified: Secondary | ICD-10-CM | POA: Diagnosis not present

## 2020-12-01 DIAGNOSIS — R531 Weakness: Secondary | ICD-10-CM | POA: Diagnosis not present

## 2020-12-01 DIAGNOSIS — E785 Hyperlipidemia, unspecified: Secondary | ICD-10-CM | POA: Diagnosis not present

## 2020-12-01 DIAGNOSIS — L02619 Cutaneous abscess of unspecified foot: Secondary | ICD-10-CM | POA: Diagnosis not present

## 2020-12-01 DIAGNOSIS — I10 Essential (primary) hypertension: Secondary | ICD-10-CM | POA: Diagnosis not present

## 2020-12-01 DIAGNOSIS — Z96651 Presence of right artificial knee joint: Secondary | ICD-10-CM | POA: Diagnosis present

## 2020-12-01 DIAGNOSIS — L03119 Cellulitis of unspecified part of limb: Secondary | ICD-10-CM | POA: Diagnosis present

## 2020-12-01 DIAGNOSIS — L03115 Cellulitis of right lower limb: Secondary | ICD-10-CM | POA: Diagnosis not present

## 2020-12-01 DIAGNOSIS — R7301 Impaired fasting glucose: Secondary | ICD-10-CM | POA: Diagnosis not present

## 2020-12-01 DIAGNOSIS — N1832 Chronic kidney disease, stage 3b: Secondary | ICD-10-CM | POA: Diagnosis present

## 2020-12-01 DIAGNOSIS — M7989 Other specified soft tissue disorders: Secondary | ICD-10-CM | POA: Diagnosis not present

## 2020-12-01 DIAGNOSIS — Z880 Allergy status to penicillin: Secondary | ICD-10-CM

## 2020-12-01 DIAGNOSIS — Z7982 Long term (current) use of aspirin: Secondary | ICD-10-CM

## 2020-12-01 DIAGNOSIS — Z20822 Contact with and (suspected) exposure to covid-19: Secondary | ICD-10-CM | POA: Diagnosis present

## 2020-12-01 DIAGNOSIS — M10279 Drug-induced gout, unspecified ankle and foot: Secondary | ICD-10-CM

## 2020-12-01 DIAGNOSIS — Z881 Allergy status to other antibiotic agents status: Secondary | ICD-10-CM | POA: Diagnosis not present

## 2020-12-01 DIAGNOSIS — Z79890 Hormone replacement therapy (postmenopausal): Secondary | ICD-10-CM

## 2020-12-01 DIAGNOSIS — T148XXA Other injury of unspecified body region, initial encounter: Secondary | ICD-10-CM | POA: Diagnosis not present

## 2020-12-01 DIAGNOSIS — R600 Localized edema: Secondary | ICD-10-CM

## 2020-12-01 LAB — CBC WITH DIFFERENTIAL/PLATELET
Abs Immature Granulocytes: 0.03 10*3/uL (ref 0.00–0.07)
Basophils Absolute: 0.1 10*3/uL (ref 0.0–0.1)
Basophils Relative: 1 %
Eosinophils Absolute: 0.3 10*3/uL (ref 0.0–0.5)
Eosinophils Relative: 4 %
HCT: 40.6 % (ref 36.0–46.0)
Hemoglobin: 13.1 g/dL (ref 12.0–15.0)
Immature Granulocytes: 0 %
Lymphocytes Relative: 15 %
Lymphs Abs: 1.3 10*3/uL (ref 0.7–4.0)
MCH: 31 pg (ref 26.0–34.0)
MCHC: 32.3 g/dL (ref 30.0–36.0)
MCV: 96.2 fL (ref 80.0–100.0)
Monocytes Absolute: 0.9 10*3/uL (ref 0.1–1.0)
Monocytes Relative: 10 %
Neutro Abs: 6.4 10*3/uL (ref 1.7–7.7)
Neutrophils Relative %: 70 %
Platelets: 338 10*3/uL (ref 150–400)
RBC: 4.22 MIL/uL (ref 3.87–5.11)
RDW: 15.6 % — ABNORMAL HIGH (ref 11.5–15.5)
WBC: 9 10*3/uL (ref 4.0–10.5)
nRBC: 0 % (ref 0.0–0.2)

## 2020-12-01 LAB — COMPREHENSIVE METABOLIC PANEL
ALT: 16 U/L (ref 0–44)
AST: 33 U/L (ref 15–41)
Albumin: 3.5 g/dL (ref 3.5–5.0)
Alkaline Phosphatase: 99 U/L (ref 38–126)
Anion gap: 6 (ref 5–15)
BUN: 25 mg/dL — ABNORMAL HIGH (ref 8–23)
CO2: 25 mmol/L (ref 22–32)
Calcium: 8.8 mg/dL — ABNORMAL LOW (ref 8.9–10.3)
Chloride: 108 mmol/L (ref 98–111)
Creatinine, Ser: 1.17 mg/dL — ABNORMAL HIGH (ref 0.44–1.00)
GFR, Estimated: 45 mL/min — ABNORMAL LOW (ref >60)
Glucose, Bld: 155 mg/dL — ABNORMAL HIGH (ref 70–99)
Potassium: 4.4 mmol/L (ref 3.5–5.1)
Sodium: 139 mmol/L (ref 135–145)
Total Bilirubin: 1.3 mg/dL — ABNORMAL HIGH (ref 0.3–1.2)
Total Protein: 7.1 g/dL (ref 6.5–8.1)

## 2020-12-01 LAB — RESP PANEL BY RT-PCR (FLU A&B, COVID) ARPGX2
Influenza A by PCR: NEGATIVE
Influenza B by PCR: NEGATIVE
SARS Coronavirus 2 by RT PCR: NEGATIVE

## 2020-12-01 LAB — CK: Total CK: 34 U/L — ABNORMAL LOW (ref 38–234)

## 2020-12-01 LAB — SEDIMENTATION RATE: Sed Rate: 53 mm/hr — ABNORMAL HIGH (ref 0–30)

## 2020-12-01 MED ORDER — VANCOMYCIN HCL 1250 MG/250ML IV SOLN
1250.0000 mg | INTRAVENOUS | Status: DC
  Filled 2020-12-01: qty 250

## 2020-12-01 MED ORDER — SODIUM CHLORIDE 0.9 % IV SOLN
2.0000 g | Freq: Two times a day (BID) | INTRAVENOUS | Status: DC
  Administered 2020-12-01 – 2020-12-04 (×6): 2 g via INTRAVENOUS
  Filled 2020-12-01 (×7): qty 2

## 2020-12-01 MED ORDER — VANCOMYCIN HCL IN DEXTROSE 1-5 GM/200ML-% IV SOLN: 1000.0000 mg | Freq: Once | INTRAVENOUS | Status: DC

## 2020-12-01 MED ORDER — MORPHINE SULFATE (PF) 2 MG/ML IV SOLN: 1.0000 mg | INTRAVENOUS | Status: DC | PRN

## 2020-12-01 MED ORDER — ALISKIREN FUMARATE 150 MG PO TABS
150.0000 mg | ORAL_TABLET | Freq: Every day | ORAL | Status: DC
  Administered 2020-12-01 – 2020-12-05 (×5): 150 mg via ORAL
  Filled 2020-12-01 (×6): qty 1

## 2020-12-01 MED ORDER — CLINDAMYCIN PHOSPHATE 600 MG/50ML IV SOLN
600.0000 mg | Freq: Once | INTRAVENOUS | Status: AC
  Administered 2020-12-01: 600 mg via INTRAVENOUS
  Filled 2020-12-01: qty 50

## 2020-12-01 MED ORDER — SODIUM CHLORIDE 0.9 % IV SOLN: 2.0000 g | Freq: Once | INTRAVENOUS | Status: DC

## 2020-12-01 MED ORDER — ONDANSETRON HCL 4 MG PO TABS: 4.0000 mg | ORAL_TABLET | Freq: Four times a day (QID) | ORAL | Status: DC | PRN

## 2020-12-01 MED ORDER — ACETAMINOPHEN 650 MG RE SUPP: 650.0000 mg | Freq: Four times a day (QID) | RECTAL | Status: DC | PRN

## 2020-12-01 MED ORDER — INSULIN ASPART 100 UNIT/ML ~~LOC~~ SOLN: 0.0000 [IU] | Freq: Every day | SUBCUTANEOUS | Status: DC

## 2020-12-01 MED ORDER — ONDANSETRON HCL 4 MG/2ML IJ SOLN: 4.0000 mg | Freq: Four times a day (QID) | INTRAMUSCULAR | Status: DC | PRN

## 2020-12-01 MED ORDER — INSULIN ASPART 100 UNIT/ML ~~LOC~~ SOLN: 0.0000 [IU] | Freq: Three times a day (TID) | SUBCUTANEOUS | Status: DC

## 2020-12-01 MED ORDER — EZETIMIBE 10 MG PO TABS
10.0000 mg | ORAL_TABLET | Freq: Every day | ORAL | Status: DC
  Administered 2020-12-02 – 2020-12-05 (×4): 10 mg via ORAL
  Filled 2020-12-01 (×4): qty 1

## 2020-12-01 MED ORDER — VANCOMYCIN HCL 2000 MG/400ML IV SOLN
2000.0000 mg | Freq: Once | INTRAVENOUS | Status: AC
  Administered 2020-12-01: 2000 mg via INTRAVENOUS
  Filled 2020-12-01: qty 400

## 2020-12-01 MED ORDER — LEVOTHYROXINE SODIUM 50 MCG PO TABS
75.0000 ug | ORAL_TABLET | Freq: Every day | ORAL | Status: DC
  Administered 2020-12-02 – 2020-12-05 (×4): 75 ug via ORAL
  Filled 2020-12-01 (×4): qty 1

## 2020-12-01 MED ORDER — ACETAMINOPHEN 325 MG PO TABS
650.0000 mg | ORAL_TABLET | Freq: Four times a day (QID) | ORAL | Status: DC | PRN
  Administered 2020-12-04: 650 mg via ORAL
  Filled 2020-12-01: qty 2

## 2020-12-01 MED ORDER — HYDROXYZINE HCL 10 MG PO TABS
10.0000 mg | ORAL_TABLET | Freq: Three times a day (TID) | ORAL | Status: DC | PRN
  Administered 2020-12-04: 10 mg via ORAL
  Filled 2020-12-01 (×2): qty 1

## 2020-12-01 NOTE — ED Provider Notes (Signed)
[facility]-EMERGENCY DEPARTMENT  ____________________________________________  Time seen: Approximately 3:17 PM  I have reviewed the triage vital signs and the nursing notes.   HISTORY  Chief Complaint Fall and Leg Pain   Historian Patient and Husband     HPI Kayla Chan is a 87 y.o. female with a history of hypertension and hyperlipidemia, presents to the emergency department with worsening right lower extremity erythema and edema.  Patient is accompanied by her husband who states that approximately a week ago, patient was walking down a curb at [employer] lost her balance and fell.  She reportedly struck the back of her knee.  She was seen and evaluated by Dr. Rudene Christians who did x-rays of the right knee and right hip and started patient on a brief course of Solu-Medrol.  Patient took medication as directed and within several days, noticed swelling and erythema along the right ankle with extension into the right calf.  Patient's primary care provider ordered a venous ultrasound which showed no signs of DVT and patient was placed on ciprofloxacin for cellulitis.  Husband reports that patient has been taking medication as directed.  However, cellulitis appears to be worsening.  Patient has no prior history of MRSA infection.  No reports of fever at home.   Past Medical History:  Diagnosis Date  . Cancer ([place])    BCC on nose  . Hyperlipidemia   . Hypertension      Immunizations up to date:  Yes.     Past Medical History:  Diagnosis Date  . Cancer ([place])    BCC on nose  . Hyperlipidemia   . Hypertension     Patient Active Problem List   Diagnosis Date Noted  . Cellulitis and abscess of foot 12/01/2020  . Bilateral impacted cerumen 01/25/2020  . Non-recurrent acute serous otitis media of right ear 01/25/2020  . Otitis externa 01/25/2020  . Hearing aid worn 01/25/2020  . At risk for polypharmacy 01/10/2020  . Dizzinesses 01/10/2020  . Decreased GFR 01/10/2020  . Morbid  obesity ([place]) 11/23/2018  . Chronic kidney disease, stage 3 (moderate) 11/09/2018  . Limited mobility 09/03/2017  . Fatigue 02/04/2016  . Palpitations 02/04/2016  . Gout 09/24/2015  . Hyperuricemia 08/20/2015  . Back pain 06/28/2015  . Constipation 06/28/2015  . Eczema 06/28/2015  . Edema 06/28/2015  . Pre-diabetes 06/28/2015  . Intertrigo 06/28/2015  . PAC (premature atrial contraction) 06/28/2015  . Premature heartbeats 06/28/2015  . History of basal cell cancer 02/18/2015  . Fatty infiltration of liver 05/31/2013  . Hypothyroidism 01/15/2011  . Arthropathy of pelvic region and thigh 08/01/2008  . HLD (hyperlipidemia) 03/13/2008  . Arthritis, degenerative 01/26/2007  . Hypertension 12/13/2006  . Personal history of venous thrombosis and embolism 05/24/2006    Past Surgical History:  Procedure Laterality Date  . 24 hour Holter Monitor  01/2013   4550 ectopic beats with 344 superventricular bigemminy events  . Abdominal Ultrasound  05/31/2013   RUQ. Fatty Liver  . APPENDECTOMY  1950   [place]  . 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, [place]; Right    Prior to Admission medications   Medication Sig Start Date End Date Taking? Authorizing Provider  aliskiren (TEKTURNA) 150 MG tablet Take 1 tablet (150 mg total) by mouth daily. 11/26/20   Birdie Sons, MD  allopurinol (ZYLOPRIM) 100 MG tablet Take 1 tablet (100 mg total) by mouth daily. 10/21/20   Birdie Sons, MD  Artificial Tear Ointment (DRY EYES OP)  Place 1 drop into the left eye as needed.    [provider]  aspirin 81 MG tablet Take 81 mg by mouth daily.    [provider]  ciprofloxacin (CIPRO) 500 MG tablet Take 1 tablet (500 mg total) by mouth 2 (two) times daily for 7 days. 11/29/20 12/06/20  Birdie Sons, MD  colchicine 0.6 MG tablet Take 1 tablet (0.6 mg total) by mouth daily as needed (gout). 05/08/20   Birdie Sons, MD  diclofenac (CATAFLAM) 50 MG  tablet TAKE 1 TABLET (50 MG TOTAL) BY MOUTH 2 (TWO) TIMES DAILY AS NEEDED (FOOT PAIN). 05/21/20   Birdie Sons, MD  diclofenac Sodium (VOLTAREN) 1 % GEL Apply 2 g topically 4 (four) times daily. 11/05/20   Trinna Post, PA-C  ezetimibe (ZETIA) 10 MG tablet TAKE 1 TABLET BY MOUTH EVERY DAY IN THE MORNING 10/28/20   Birdie Sons, MD  furosemide (LASIX) 20 MG tablet Take 0.5 tablets (10 mg total) by mouth in the morning. 07/23/20   Birdie Sons, MD  hydrocortisone 2.5 % lotion Apply to face once daily as needed for itchiness 05/02/18   [provider]  hydrOXYzine (ATARAX/VISTARIL) 10 MG tablet Take 1 tablet (10 mg total) by mouth every 8 (eight) hours as needed for itching. 07/09/20   Birdie Sons, MD  ketoconazole (NIZORAL) 2 % shampoo Apply topically. 05/02/18   [provider]  levothyroxine (SYNTHROID) 75 MCG tablet TAKE 1 TABLET BY MOUTH EVERY DAY 10/28/20   Birdie Sons, MD  meloxicam (MOBIC) 15 MG tablet Take 1 tablet (15 mg total) by mouth daily. 12/26/19   Edrick Kins, DPM  mometasone (ELOCON) 0.1 % ointment APPLY 1 APPLICATION DAILY AS NEEDED TOPICALLY. APPLY TO AFFECTED AREA 05/13/20   Birdie Sons, MD  Multiple Vitamins-Minerals (MULTIVITAMIN ADULT PO) Take 1 tablet by mouth daily. 10/11/08   [provider]  naproxen (NAPROSYN) 500 MG tablet Take 1 tablet (500 mg total) by mouth 2 (two) times daily as needed for up to 15 days. 11/29/20 12/14/20  Birdie Sons, MD  pimecrolimus (ELIDEL) 1 % cream Apply topically 2 (two) times daily. 05/31/19   Birdie Sons, MD  sulfacetamide (BLEPH-10) 10 % ophthalmic solution Place 1 drop into the right eye every 4 (four) hours. 07/10/20   Birdie Sons, MD  tacrolimus (PROTOPIC) 0.1 % ointment Apply to itching on face twice daily 01/08/20   [provider]    Allergies Lisinopril, Amlodipine, Atorvastatin, Colestid  [colestipol hcl], Crestor  [rosuvastatin calcium], Doxycycline, Fluvastatin  sodium, Furosemide, Losartan, Pravastatin sodium, Verapamil, Allopurinol, and Penicillins  Family History  Problem Relation Age of Onset  . Stroke Mother        possible stroke  . Heart Problems Father   . Kidney cancer Sister   . Bladder Cancer Brother     Social History Social History   Tobacco Use  . Smoking status: Never Smoker  . Smokeless tobacco: Never Used  Vaping Use  . Vaping Use: Never used  Substance Use Topics  . Alcohol use: Yes    Alcohol/week: 0.0 standard drinks    Comment: 1 glass of wine seldom  . Drug use: No     Review of Systems  Constitutional: No fever/chills Eyes:  No discharge ENT: No upper respiratory complaints. Respiratory: no cough. No SOB/ use of accessory muscles to breath Gastrointestinal:   No nausea, no vomiting.  No diarrhea.  No constipation. Musculoskeletal: Patient  has right ankle and calf pain.  Skin: Patient has erythema right lower extremity.    ____________________________________________   PHYSICAL EXAM:  VITAL SIGNS: ED Triage Vitals  Enc Vitals Group     BP 12/01/20 1330 (!) 183/76     Pulse Rate 12/01/20 1330 92     Resp 12/01/20 1330 18     Temp 12/01/20 1330 97.8 F (36.6 C)     Temp src --      SpO2 12/01/20 1330 98 %     Weight --      Height --      Head Circumference --      Peak Flow --      Pain Score 12/01/20 1326 8     Pain Loc --      Pain Edu? --      Excl. in [place]? --      Constitutional: Alert and oriented. Well appearing and in no acute distress. Eyes: Conjunctivae are normal. PERRL. EOMI. Head: Atraumatic. ENT:      Nose: No congestion/rhinnorhea.      Mouth/Throat: Mucous membranes are moist.  Neck: No stridor.  No cervical spine tenderness to palpation. Hematological/Lymphatic/Immunilogical: No cervical lymphadenopathy. Cardiovascular: Normal rate, regular rhythm. Normal S1 and S2.  Good peripheral circulation. Respiratory: Normal respiratory effort without tachypnea or retractions.  Lungs CTAB. Good air entry to the bases with no decreased or absent breath sounds Gastrointestinal: Bowel sounds x 4 quadrants. Soft and nontender to palpation. No guarding or rigidity. No distention. Musculoskeletal: Full range of motion to all extremities. No obvious deformities noted Neurologic:  Normal for age. No gross focal neurologic deficits are appreciated.  Skin: Patient has right lower extremity erythema of the right ankle that extends to mid-right calf. Patient has 3 + pitting edema on the right. Palpable dorsalis pedis pulse, right.  Psychiatric: Mood and affect are normal for age. Speech and behavior are normal.   ____________________________________________   LABS (all labs ordered are listed, but only abnormal results are displayed)  Labs Reviewed  CBC WITH DIFFERENTIAL/PLATELET - Abnormal; Notable for the following components:      Result Value   RDW 15.6 (*)    All other components within normal limits  COMPREHENSIVE METABOLIC PANEL - Abnormal; Notable for the following components:   Glucose, Bld 155 (*)    BUN 25 (*)    Creatinine, Ser 1.17 (*)    Calcium 8.8 (*)    Total Bilirubin 1.3 (*)    GFR, Estimated 45 (*)    All other components within normal limits  RESP PANEL BY RT-PCR (FLU A&B, COVID) ARPGX2  CK   ____________________________________________  EKG   ____________________________________________  RADIOLOGY Unk Pinto, personally viewed and evaluated these images (plain radiographs) as part of my medical decision making, as well as reviewing the written report by the radiologist.  DG Ankle Complete Right  Result Date: 12/01/2020 CLINICAL DATA:  87 year old female with right lower extremity swelling hand pain. EXAM: RIGHT ANKLE - COMPLETE 3+ VIEW COMPARISON:  Right lower extremity radiograph dated 12/26/2019. FINDINGS: There is no acute fracture or dislocation. There is osteopenia. The ankle mortise is intact. Mild spurring from the dorsal  anterior talus. There is diffuse skin thickening and subcutaneous soft tissue edema. No radiopaque foreign object or soft tissue gas. IMPRESSION: 1. No acute fracture or dislocation. 2. Diffuse skin thickening and subcutaneous soft tissue edema. Electronically Signed   By: Anner Crete M.D.   On: 12/01/2020 14:58  US Venous Img Lower Unilateral Right  Result Date: 12/01/2020 CLINICAL DATA:  87 year old female with right lower extremity swelling. EXAM: RIGHT LOWER EXTREMITY VENOUS DOPPLER ULTRASOUND TECHNIQUE: Gray-scale sonography with graded compression, as well as color Doppler and duplex ultrasound were performed to evaluate the right lower extremity deep venous systems from the level of the common femoral vein and including the common femoral, femoral, profunda femoral, popliteal and calf veins including the posterior tibial, peroneal and gastrocnemius veins when visible. Spectral Doppler was utilized to evaluate flow at rest and with distal augmentation maneuvers in the common femoral, femoral and popliteal veins. The contralateral common femoral vein was also evaluated for comparison. COMPARISON:  11/27/2020 FINDINGS: RIGHT LOWER EXTREMITY Common Femoral Vein: No evidence of thrombus. Normal compressibility, respiratory phasicity and response to augmentation. Central Greater Saphenous Vein: No evidence of thrombus. Normal compressibility and flow on color Doppler imaging. Central Profunda Femoral Vein: No evidence of thrombus. Normal compressibility and flow on color Doppler imaging. Femoral Vein: No evidence of thrombus. Normal compressibility, respiratory phasicity and response to augmentation. Popliteal Vein: No evidence of thrombus. Normal compressibility, respiratory phasicity and response to augmentation. Calf Veins: No evidence of thrombus. Normal compressibility and flow on color Doppler imaging. Other Findings: Simple appearing fluid collection in the right popliteal fossa measuring  approximately 6.1 x 1.3 x 1.6 cm with ill-defined extension into the medial aspect of the calf. LEFT LOWER EXTREMITY Common Femoral Vein: No evidence of thrombus. Normal compressibility, respiratory phasicity and response to augmentation. IMPRESSION: 1. No evidence of right lower extremity deep vein thrombosis. 2. Simple appearing fluid collection right popliteal fossa measuring up to approximately 6.1 cm with extension into the right medial calf. These findings are concerning for Baker's cyst with possible rupture. Hematoma could appear similarly. Right lower extremity MRI could be obtained for further characterization as clinically indicated. Ruthann Cancer, MD [facility] Radiology Electronically Signed   By: Ruthann Cancer MD   On: 12/01/2020 14:57    ____________________________________________    PROCEDURES  Procedure(s) performed:     Procedures     Medications  vancomycin (VANCOCIN) IVPB 1000 mg/200 mL premix (has no administration in time range)  ceFEPIme (MAXIPIME) 2 g in sodium chloride 0.9 % 100 mL IVPB (has no administration in time range)  acetaminophen (TYLENOL) tablet 650 mg (has no administration in time range)    Or  acetaminophen (TYLENOL) suppository 650 mg (has no administration in time range)  ondansetron (ZOFRAN) tablet 4 mg (has no administration in time range)    Or  ondansetron (ZOFRAN) injection 4 mg (has no administration in time range)  aliskiren (TEKTURNA) tablet 150 mg (has no administration in time range)  levothyroxine (SYNTHROID) tablet 75 mcg (has no administration in time range)  clindamycin (CLEOCIN) IVPB 600 mg (600 mg Intravenous New Bag/Given 12/01/20 1525)     ____________________________________________   INITIAL IMPRESSION / ASSESSMENT AND PLAN / ED COURSE  Pertinent labs & imaging results that were available during my care of the patient were reviewed by me and considered in my medical  decision making (see chart for details).      Assessment and Plan: Right lower extremity pain:  Cellulitis:  87 year old female presents to the emergency department with progressively worsening right lower extremity erythema and edema after having a negative ultrasound by primary care and starting ciprofloxacin.  Patient was hypertensive at triage but vital signs were otherwise reassuring.  Patient had mild elevation in her creatinine at 1.17.  No leukocytosis on CBC.  I did repeat venous ultrasound today which showed no new thrombus formation.  Patient had good capillary refill and a palpable dorsalis pedis pulse on the right.  Suspicion for arterial occlusion is very low as patient's pain is manageable and she has no deficits in sensation.  I am concerned for cellulitis given patient's recent steroid use and trauma.  Will give patient clindamycin in the emergency department and admit patient to the hospitalist service.  Patient was accepted for admission by Dr. Tobie Poet.    ____________________________________________  FINAL CLINICAL IMPRESSION(S) / ED DIAGNOSES  Final diagnoses:  Cellulitis of right lower extremity      NEW MEDICATIONS STARTED DURING THIS VISIT:  ED Discharge Orders    None          This chart was dictated using voice recognition software/Dragon. Despite best efforts to proofread, errors can occur which can change the meaning. Any change was purely unintentional.     Lannie Fields, PA-C 12/01/20 Barnabas Harries, MD 12/03/20 2034

## 2020-12-01 NOTE — ED Triage Notes (Signed)
Pt comes with c/o left lower leg pain following a fall last week. Pt states she fell and noticed bruise. Pt states now it is swelling, warm and red.  Pt states it is painful to walk now.

## 2020-12-01 NOTE — H&P (Signed)
History and Physical   Kayla Chan ALP:[id number] DOB: 1932/01/24 DOA: 12/01/2020  PCP: Birdie Sons, MD  Outpatient Specialists: Dr. Marry Guan Patient coming from: home  I have personally briefly reviewed patient's old medical records in [place].  Chief Concern: Right lower extremity pain  HPI: Kayla Chan is a 87 y.o. female with medical history significant for hypertension, obesity, constipation, presents to the emergency department for chief concerns of right lower extremity pain and swelling.  Kayla Chan states that she fell when leaving a restaurant approximately 3 weeks ago.  She was initially given prednisone by her orthopedic doctor.  She was then prescribed ciprofloxacin 500 mg twice daily, for 7 days, by his PCP on 11/29/2020.  She has been compliant with the medication however her right lower extremity has continued to worsen.  This prompted her to present to the emergency department for further evaluation.  She denies fever, chest pain, shortness of breath, abdominal pain, dysuria.  She denies nausea and vomiting. She denies numbness.  She states that it hurts a lot.   She states that the last time she ate something was at 12:30 pm and had a biscuit  At bedside dorsi and plantar flexion elicited some tenderness for the patient.   Social history: lives with spouse. She denies recreatioanl drug use and tobaco use.  She infrequently uses etoh, once or twice per year  Vaccination: 3 doses for covid 19  ROS: Constitutional: no weight change, no fever ENT/Mouth: no sore throat, no rhinorrhea Eyes: no eye pain, no vision changes Cardiovascular: no chest pain, no dyspnea,  no edema, no palpitations Respiratory: no cough, no sputum, no wheezing Gastrointestinal: no nausea, no vomiting, no diarrhea, no constipation Genitourinary: no urinary incontinence, no dysuria, no hematuria Musculoskeletal: no arthralgias, no myalgias Skin: + skin lesions, no pruritus, Neuro: +  weakness, no loss of consciousness, no syncope Psych: no anxiety, no depression, + decrease appetite Heme/Lymph: no bruising, no bleeding  ED Course: Discussed with ED provider, patient requiring hospitalization due to failing outpatient antibiotic for cellulitis.  Vitals in the emergency department was remarkable for temperature of 97.8, respiration rate of 18, heart rate 92, blood pressure 183/76, SPO2 saturation of 98% on room air.  Labs in the emergency department was remarkable for sodium level 139, potassium 4.4, chloride 108, bicarb 25, BUN 25, serum creatinine of 1.17, nonfasting blood glucose 155, WBC 9, hemoglobin 13.1, platelets 338.  EDP gave patient 1 dose of clindamycin 600 mg IV.  Assessment/Plan  Active Problems:   Cellulitis and abscess of foot   Right foot cellulitis - POA -Images has been uploaded into patient's chart under media - Compartment syndrome considered however, CK was negative and no AKI - Orthopedic surgery has been consulted and will see the patient - US guided drain ordered with labs - Treat with vancomycin and cefepime  Hypothyroid-resumed home levothyroxine 75 mcg daily in the a.m.  Hypertension-resumed home aliskiren 150 mg daily  Hyperglycemia-suspect secondary to recent steroid use, insulin SSI with at bedtime coverage  CKD 3 8-at baseline  DVT prophylaxis-pharmacologic DVT prophylaxis has not been initiated in anticipation of possible procedure  Chart reviewed.   DVT prophylaxis: TED hose, holding DVT prophylaxis in anticipation of procedure Code Status: Full code Diet: Heart healthy Family Communication: Updated spouse at bedside Disposition Plan: Pending clinical course Consults called: Orthopedic surgeon, Dr. Marry Guan Admission status: Observation, MedSurg, with telemetry  Past Medical History:  Diagnosis Date  . Cancer ([place])    [place]  on nose  . Hyperlipidemia   . Hypertension    Past Surgical History:  Procedure Laterality  Date  . 24 hour Holter Monitor  01/2013   4550 ectopic beats with 344 superventricular bigemminy events  . Abdominal Ultrasound  05/31/2013   RUQ. Fatty Liver  . APPENDECTOMY  1950   [place]  . 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, [place]; Right   Social History:  reports that she has never smoked. She has never used smokeless tobacco. She reports current alcohol use. She reports that she does not use drugs.  Allergies  Allergen Reactions  . Lisinopril     Intense itching per patient can not tolerate   . Amlodipine     dizziness  . Atorvastatin     Other reaction(s): Muscle Pain  . Colestid  [Colestipol Hcl]     Itching, insomnia  . Crestor  [Rosuvastatin Calcium]     Other reaction(s): Muscle Pain  . Doxycycline   . Fluvastatin Sodium     Other reaction(s): Muscle Pain  . Furosemide     Severe Cramping  . Losartan     itching  . Pravastatin Sodium     Numbness in legs  . Verapamil     Shortness of breath, swelling, palpations.   . Allopurinol Other (See Comments)    dizziness  . Penicillins Rash    Took for pneumonia when she was 60 and mader her mouth break out   Family History  Problem Relation Age of Onset  . Stroke Mother        possible stroke  . Heart Problems Father   . Kidney cancer Sister   . Bladder Cancer Brother    Family history: Family history reviewed and not pertinent  Prior to Admission medications   Medication Sig Start Date End Date Taking? Authorizing Provider  aliskiren (TEKTURNA) 150 MG tablet Take 1 tablet (150 mg total) by mouth daily. 11/26/20   Birdie Sons, MD  allopurinol (ZYLOPRIM) 100 MG tablet Take 1 tablet (100 mg total) by mouth daily. 10/21/20   Birdie Sons, MD  Artificial Tear Ointment (DRY EYES OP) Place 1 drop into the left eye as needed.    [provider]  aspirin 81 MG tablet Take 81 mg by mouth daily.    [provider]  ciprofloxacin (CIPRO) 500 MG tablet Take 1  tablet (500 mg total) by mouth 2 (two) times daily for 7 days. 11/29/20 12/06/20  Birdie Sons, MD  colchicine 0.6 MG tablet Take 1 tablet (0.6 mg total) by mouth daily as needed (gout). 05/08/20   Birdie Sons, MD  diclofenac (CATAFLAM) 50 MG tablet TAKE 1 TABLET (50 MG TOTAL) BY MOUTH 2 (TWO) TIMES DAILY AS NEEDED (FOOT PAIN). 05/21/20   Birdie Sons, MD  diclofenac Sodium (VOLTAREN) 1 % GEL Apply 2 g topically 4 (four) times daily. 11/05/20   Trinna Post, PA-C  ezetimibe (ZETIA) 10 MG tablet TAKE 1 TABLET BY MOUTH EVERY DAY IN THE MORNING 10/28/20   Birdie Sons, MD  furosemide (LASIX) 20 MG tablet Take 0.5 tablets (10 mg total) by mouth in the morning. 07/23/20   Birdie Sons, MD  hydrocortisone 2.5 % lotion Apply to face once daily as needed for itchiness 05/02/18   [provider]  hydrOXYzine (ATARAX/VISTARIL) 10 MG tablet Take 1 tablet (10 mg total) by mouth every 8 (eight) hours as needed for itching. 07/09/20   Fisher,  Kirstie Peri, MD  ketoconazole (NIZORAL) 2 % shampoo Apply topically. 05/02/18   [provider]  levothyroxine (SYNTHROID) 75 MCG tablet TAKE 1 TABLET BY MOUTH EVERY DAY 10/28/20   Birdie Sons, MD  meloxicam (MOBIC) 15 MG tablet Take 1 tablet (15 mg total) by mouth daily. 12/26/19   Edrick Kins, DPM  mometasone (ELOCON) 0.1 % ointment APPLY 1 APPLICATION DAILY AS NEEDED TOPICALLY. APPLY TO AFFECTED AREA 05/13/20   Birdie Sons, MD  Multiple Vitamins-Minerals (MULTIVITAMIN ADULT PO) Take 1 tablet by mouth daily. 10/11/08   [provider]  naproxen (NAPROSYN) 500 MG tablet Take 1 tablet (500 mg total) by mouth 2 (two) times daily as needed for up to 15 days. 11/29/20 12/14/20  Birdie Sons, MD  pimecrolimus (ELIDEL) 1 % cream Apply topically 2 (two) times daily. 05/31/19   Birdie Sons, MD  sulfacetamide (BLEPH-10) 10 % ophthalmic solution Place 1 drop into the right eye every 4 (four) hours. 07/10/20   Birdie Sons, MD   tacrolimus (PROTOPIC) 0.1 % ointment Apply to itching on face twice daily 01/08/20   [provider]   Physical Exam: Vitals:   12/01/20 1330  BP: (!) 183/76  Pulse: 92  Resp: 18  Temp: 97.8 F (36.6 C)  SpO2: 98%   Constitutional: appears age-appropriate, NAD, calm, comfortable Eyes: PERRL, lids and conjunctivae normal ENMT: Mucous membranes are moist. Posterior pharynx clear of any exudate or lesions. Age-appropriate dentition.  Moderate to severe hearing loss Neck: normal, supple, no masses, no thyromegaly Respiratory: clear to auscultation bilaterally, no wheezing, no crackles. Normal respiratory effort. No accessory muscle use.  Cardiovascular: Regular rate and rhythm, no murmurs / rubs / gallops. No extremity edema. 2+ pedal pulses. No carotid bruits.  Abdomen: Obese abdomen, no tenderness, no masses palpated, no hepatosplenomegaly. Bowel sounds positive.  Musculoskeletal: no clubbing / cyanosis. No joint deformity upper and lower extremities. Good ROM, no contractures, no atrophy. Normal muscle tone.  Skin: Right lower extremity edema, swelling with ecchymosis Neurologic: Sensation intact. Strength 5/5 in all 4.  Psychiatric: Normal judgment and insight. Alert and oriented x 3. Normal mood.   EKG: Not indicated  x-ray on Admission: I personally reviewed and I agree with radiologist reading as below.  DG Ankle Complete Right  Result Date: 12/01/2020 CLINICAL DATA:  87 year old female with right lower extremity swelling hand pain. EXAM: RIGHT ANKLE - COMPLETE 3+ VIEW COMPARISON:  Right lower extremity radiograph dated 12/26/2019. FINDINGS: There is no acute fracture or dislocation. There is osteopenia. The ankle mortise is intact. Mild spurring from the dorsal anterior talus. There is diffuse skin thickening and subcutaneous soft tissue edema. No radiopaque foreign object or soft tissue gas. IMPRESSION: 1. No acute fracture or dislocation. 2. Diffuse skin thickening and  subcutaneous soft tissue edema. Electronically Signed   By: Anner Crete M.D.   On: 12/01/2020 14:58   US Venous Img Lower Unilateral Right  Result Date: 12/01/2020 CLINICAL DATA:  87 year old female with right lower extremity swelling. EXAM: RIGHT LOWER EXTREMITY VENOUS DOPPLER ULTRASOUND TECHNIQUE: Gray-scale sonography with graded compression, as well as color Doppler and duplex ultrasound were performed to evaluate the right lower extremity deep venous systems from the level of the common femoral vein and including the common femoral, femoral, profunda femoral, popliteal and calf veins including the posterior tibial, peroneal and gastrocnemius veins when visible. Spectral Doppler was utilized to evaluate flow at rest and with distal augmentation maneuvers in the common femoral,  femoral and popliteal veins. The contralateral common femoral vein was also evaluated for comparison. COMPARISON:  11/27/2020 FINDINGS: RIGHT LOWER EXTREMITY Common Femoral Vein: No evidence of thrombus. Normal compressibility, respiratory phasicity and response to augmentation. Central Greater Saphenous Vein: No evidence of thrombus. Normal compressibility and flow on color Doppler imaging. Central Profunda Femoral Vein: No evidence of thrombus. Normal compressibility and flow on color Doppler imaging. Femoral Vein: No evidence of thrombus. Normal compressibility, respiratory phasicity and response to augmentation. Popliteal Vein: No evidence of thrombus. Normal compressibility, respiratory phasicity and response to augmentation. Calf Veins: No evidence of thrombus. Normal compressibility and flow on color Doppler imaging. Other Findings: Simple appearing fluid collection in the right popliteal fossa measuring approximately 6.1 x 1.3 x 1.6 cm with ill-defined extension into the medial aspect of the calf. LEFT LOWER EXTREMITY Common Femoral Vein: No evidence of thrombus. Normal compressibility, respiratory phasicity and response  to augmentation. IMPRESSION: 1. No evidence of right lower extremity deep vein thrombosis. 2. Simple appearing fluid collection right popliteal fossa measuring up to approximately 6.1 cm with extension into the right medial calf. These findings are concerning for Baker's cyst with possible rupture. Hematoma could appear similarly. Right lower extremity MRI could be obtained for further characterization as clinically indicated. Ruthann Cancer, MD [facility] Electronically Signed   By: Ruthann Cancer MD   On: 12/01/2020 14:57   Labs on Admission: I have personally reviewed following labs  CBC: Recent Labs  Lab 12/01/20 1352  WBC 9.0  NEUTROABS 6.4  HGB 13.1  HCT 40.6  MCV 96.2  PLT 144   Basic Metabolic Panel: Recent Labs  Lab 12/01/20 1352  NA 139  K 4.4  CL 108  CO2 25  GLUCOSE 155*  BUN 25*  CREATININE 1.17*  CALCIUM 8.8*   GFR: CrCl cannot be calculated (Unknown ideal weight.).  Liver Function Tests: Recent Labs  Lab 12/01/20 1352  AST 33  ALT 16  ALKPHOS 99  BILITOT 1.3*  PROT 7.1  ALBUMIN 3.5   Urine analysis:    Component Value Date/Time   COLORURINE STRAW (A) 01/05/2020 1149   APPEARANCEUR CLEAR (A) 01/05/2020 1149   APPEARANCEUR Cloudy 08/05/2013 1243   LABSPEC 1.010 01/05/2020 1149   LABSPEC 1.019 08/05/2013 1243   PHURINE 5.0 01/05/2020 1149   GLUCOSEU NEGATIVE 01/05/2020 1149   GLUCOSEU Negative 08/05/2013 1243   HGBUR NEGATIVE 01/05/2020 1149   BILIRUBINUR negative 11/26/2020 1507   BILIRUBINUR Negative 08/05/2013 1243   KETONESUR NEGATIVE 01/05/2020 1149   PROTEINUR Negative 11/26/2020 1507   PROTEINUR NEGATIVE 01/05/2020 1149   UROBILINOGEN 0.2 11/26/2020 1507   NITRITE Negative 11/26/2020 1507   NITRITE NEGATIVE 01/05/2020 1149   LEUKOCYTESUR Trace (A) 11/26/2020 1507   LEUKOCYTESUR NEGATIVE 01/05/2020 1149   LEUKOCYTESUR 3+ 08/05/2013 1243   Asmaa Tirpak N Marshall Roehrich D.O. [facility]  If 7PM-7AM, please contact overnight-coverage provider If 7AM-7PM, please contact day coverage provider [email or web address]  12/01/2020, 3:48 PM

## 2020-12-01 NOTE — ED Notes (Signed)
Patient is alert and oriented x4, HOH, ambulatory with assistance, and in no acute distress. Will continue to monitor and assess.

## 2020-12-01 NOTE — Consult Note (Signed)
ORTHOPAEDIC CONSULTATION  PATIENT NAME: Kayla Chan DOB: 05-14-1932  MRN: [id number]  REQUESTING PHYSICIAN: Cox, Amy Delane Ginger, DO  Chief Complaint: Right lower extremity erythema and swelling  HPI: Kayla Chan is a 87 y.o. female who presented to the Emergency Department with complaints of right lower extremity erythema and swelling. She sustained a fall on 11/18/2020 and struck the right knee. She was evaluated by Dr. Rudene Christians on 11/20/2020 for knee, hip, and back pain and started on a tapering dose of steroids. Radiographs at that time were negative for implant loosening or acute fracture.  She had marked ecchymosis of the lower leg and increased pain.  She was subsequently evaluated by her PCP (Dr. Lelon Huh) who ordered an ultrasound that was negative for DVT. He initiated a course of Cipro. The patient did not appreciate any improvement of the pain, redness, and swelling and presented to the ED for further evaluation. Repeat ultrasound was negative for DVT but did demonstrated a popliteal fluid collection with extension into the right medial calf. She denies any fevers or chills.  Of note, the patient had a right total knee arthroplasty performed on 09/10/2011 by Dr. Cloria Spring that was complicated by a condyle fracture. She has a history of mid-flexion instability and swelling.  Past Medical History:  Diagnosis Date  . Cancer ([place])    BCC on nose  . Hyperlipidemia   . Hypertension    Past Surgical History:  Procedure Laterality Date  . 24 hour Holter Monitor  01/2013   4550 ectopic beats with 344 superventricular bigemminy events  . Abdominal Ultrasound  05/31/2013   RUQ. Fatty Liver  . APPENDECTOMY  1950   [place]  . 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, [place]; Right   Social History   Socioeconomic History  . Marital status: Married    Spouse name: Not on file  . Number of children: 0  . Years of education: Not on file  . Highest education  level: Some college, no degree  Occupational History  . Occupation: Retired  Tobacco Use  . Smoking status: Never Smoker  . Smokeless tobacco: Never Used  Vaping Use  . Vaping Use: Never used  Substance and Sexual Activity  . Alcohol use: Yes    Alcohol/week: 0.0 standard drinks    Comment: 1 glass of wine seldom  . Drug use: No  . Sexual activity: Not on file  Other Topics Concern  . Not on file  Social History Narrative  . Not on file   Social Determinants of Health   Financial Resource Strain: Low Risk   . Difficulty of Paying Living Expenses: Not hard at all  Food Insecurity: No Food Insecurity  . Worried About Charity fundraiser in the Last Year: Never true  . Ran Out of Food in the Last Year: Never true  Transportation Needs: No Transportation Needs  . Lack of Transportation (Medical): No  . Lack of Transportation (Non-Medical): No  Physical Activity: Inactive  . Days of Exercise per Week: 0 days  . Minutes of Exercise per Session: 0 min  Stress: No Stress Concern Present  . Feeling of Stress : Not at all  Social Connections: Moderately Integrated  . Frequency of Communication with Friends and Family: More than three times a week  . Frequency of Social Gatherings with Friends and Family: More than three times a week  . Attends Religious Services: More than 4 times per year  . Active Member of Clubs  or Organizations: No  . Attends Archivist Meetings: Never  . Marital Status: Married   Family History  Problem Relation Age of Onset  . Stroke Mother        possible stroke  . Heart Problems Father   . Kidney cancer Sister   . Bladder Cancer Brother    Allergies  Allergen Reactions  . Lisinopril     Intense itching per patient can not tolerate   . Amlodipine     dizziness  . Atorvastatin     Other reaction(s): Muscle Pain  . Colestid  [Colestipol Hcl]     Itching, insomnia  . Crestor  [Rosuvastatin Calcium]     Other reaction(s): Muscle Pain   . Doxycycline   . Fluvastatin Sodium     Other reaction(s): Muscle Pain  . Furosemide     Severe Cramping  . Losartan     itching  . Pravastatin Sodium     Numbness in legs  . Verapamil     Shortness of breath, swelling, palpations.   . Allopurinol Other (See Comments)    dizziness  . Penicillins Rash    Took for pneumonia when she was 83 and mader her mouth break out   Prior to Admission medications   Medication Sig Start Date End Date Taking? Authorizing Provider  aliskiren (TEKTURNA) 150 MG tablet Take 1 tablet (150 mg total) by mouth daily. 11/26/20   Birdie Sons, MD  allopurinol (ZYLOPRIM) 100 MG tablet Take 1 tablet (100 mg total) by mouth daily. 10/21/20   Birdie Sons, MD  Artificial Tear Ointment (DRY EYES OP) Place 1 drop into the left eye as needed.    [provider]  aspirin 81 MG tablet Take 81 mg by mouth daily.    [provider]  ciprofloxacin (CIPRO) 500 MG tablet Take 1 tablet (500 mg total) by mouth 2 (two) times daily for 7 days. 11/29/20 12/06/20  Birdie Sons, MD  colchicine 0.6 MG tablet Take 1 tablet (0.6 mg total) by mouth daily as needed (gout). 05/08/20   Birdie Sons, MD  diclofenac (CATAFLAM) 50 MG tablet TAKE 1 TABLET (50 MG TOTAL) BY MOUTH 2 (TWO) TIMES DAILY AS NEEDED (FOOT PAIN). 05/21/20   Birdie Sons, MD  diclofenac Sodium (VOLTAREN) 1 % GEL Apply 2 g topically 4 (four) times daily. 11/05/20   Trinna Post, PA-C  ezetimibe (ZETIA) 10 MG tablet TAKE 1 TABLET BY MOUTH EVERY DAY IN THE MORNING 10/28/20   Birdie Sons, MD  furosemide (LASIX) 20 MG tablet Take 0.5 tablets (10 mg total) by mouth in the morning. 07/23/20   Birdie Sons, MD  hydrocortisone 2.5 % lotion Apply to face once daily as needed for itchiness 05/02/18   [provider]  hydrOXYzine (ATARAX/VISTARIL) 10 MG tablet Take 1 tablet (10 mg total) by mouth every 8 (eight) hours as needed for itching. 07/09/20   Birdie Sons, MD   ketoconazole (NIZORAL) 2 % shampoo Apply topically. 05/02/18   [provider]  levothyroxine (SYNTHROID) 75 MCG tablet TAKE 1 TABLET BY MOUTH EVERY DAY 10/28/20   Birdie Sons, MD  meloxicam (MOBIC) 15 MG tablet Take 1 tablet (15 mg total) by mouth daily. 12/26/19   Edrick Kins, DPM  mometasone (ELOCON) 0.1 % ointment APPLY 1 APPLICATION DAILY AS NEEDED TOPICALLY. APPLY TO AFFECTED AREA 05/13/20   Birdie Sons, MD  Multiple Vitamins-Minerals (MULTIVITAMIN ADULT PO) Take 1 tablet  by mouth daily. 10/11/08   [provider]  naproxen (NAPROSYN) 500 MG tablet Take 1 tablet (500 mg total) by mouth 2 (two) times daily as needed for up to 15 days. 11/29/20 12/14/20  Birdie Sons, MD  pimecrolimus (ELIDEL) 1 % cream Apply topically 2 (two) times daily. 05/31/19   Birdie Sons, MD  sulfacetamide (BLEPH-10) 10 % ophthalmic solution Place 1 drop into the right eye every 4 (four) hours. 07/10/20   Birdie Sons, MD  tacrolimus (PROTOPIC) 0.1 % ointment Apply to itching on face twice daily 01/08/20   [provider]   DG Ankle Complete Right  Result Date: 12/01/2020 CLINICAL DATA:  87 year old female with right lower extremity swelling hand pain. EXAM: RIGHT ANKLE - COMPLETE 3+ VIEW COMPARISON:  Right lower extremity radiograph dated 12/26/2019. FINDINGS: There is no acute fracture or dislocation. There is osteopenia. The ankle mortise is intact. Mild spurring from the dorsal anterior talus. There is diffuse skin thickening and subcutaneous soft tissue edema. No radiopaque foreign object or soft tissue gas. IMPRESSION: 1. No acute fracture or dislocation. 2. Diffuse skin thickening and subcutaneous soft tissue edema. Electronically Signed   By: Anner Crete M.D.   On: 12/01/2020 14:58   US Venous Img Lower Unilateral Right  Result Date: 12/01/2020 CLINICAL DATA:  87 year old female with right lower extremity swelling. EXAM: RIGHT LOWER EXTREMITY VENOUS DOPPLER ULTRASOUND  TECHNIQUE: Gray-scale sonography with graded compression, as well as color Doppler and duplex ultrasound were performed to evaluate the right lower extremity deep venous systems from the level of the common femoral vein and including the common femoral, femoral, profunda femoral, popliteal and calf veins including the posterior tibial, peroneal and gastrocnemius veins when visible. Spectral Doppler was utilized to evaluate flow at rest and with distal augmentation maneuvers in the common femoral, femoral and popliteal veins. The contralateral common femoral vein was also evaluated for comparison. COMPARISON:  11/27/2020 FINDINGS: RIGHT LOWER EXTREMITY Common Femoral Vein: No evidence of thrombus. Normal compressibility, respiratory phasicity and response to augmentation. Central Greater Saphenous Vein: No evidence of thrombus. Normal compressibility and flow on color Doppler imaging. Central Profunda Femoral Vein: No evidence of thrombus. Normal compressibility and flow on color Doppler imaging. Femoral Vein: No evidence of thrombus. Normal compressibility, respiratory phasicity and response to augmentation. Popliteal Vein: No evidence of thrombus. Normal compressibility, respiratory phasicity and response to augmentation. Calf Veins: No evidence of thrombus. Normal compressibility and flow on color Doppler imaging. Other Findings: Simple appearing fluid collection in the right popliteal fossa measuring approximately 6.1 x 1.3 x 1.6 cm with ill-defined extension into the medial aspect of the calf. LEFT LOWER EXTREMITY Common Femoral Vein: No evidence of thrombus. Normal compressibility, respiratory phasicity and response to augmentation. IMPRESSION: 1. No evidence of right lower extremity deep vein thrombosis. 2. Simple appearing fluid collection right popliteal fossa measuring up to approximately 6.1 cm with extension into the right medial calf. These findings are concerning for Baker's cyst with possible rupture.  Hematoma could appear similarly. Right lower extremity MRI could be obtained for further characterization as clinically indicated. Ruthann Cancer, MD [facility] Radiology Electronically Signed   By: Ruthann Cancer MD   On: 12/01/2020 14:57   X-rays: I reviewed radiographs of the right [facility] from [facility] obtained on 11/20/2020. Total knee implants are in place without evidence of loosening. There appears to be an old, healed fracture of the medial condyle . Relative varus alignment is noted.  No gross evidence of polyethylene wear or osteolysis.  Labs: ESR =53 CRP = 7.0 WBC = 9.0 with a normal differential  Positive ROS: All other systems have been reviewed and were otherwise negative with the exception of those mentioned in the HPI and as above.  Physical Exam: General: Well developed, well nourished obese female seen in no acute distress. HEENT: Atraumatic and normocephalic. Sclera are clear. Extraocular motion is intact. Oropharynx is clear with moist mucosa. Neck: Supple, nontender, good range of motion. No JVD or carotid bruits. Lungs: Clear to auscultation bilaterally. Cardiovascular: Regular rate and rhythm with normal S1 and S2. No murmurs. No gallops or rubs. Pedal pulses are palpable bilaterally. Homans test is equivocal on the right. Pitting edema to the right lower extremity. Abdomen: Soft, nontender, and nondistended. Bowel sounds are present. Skin: No lesions in the area of chief complaint Neurologic: Awake, alert, and oriented. Sensory function is grossly intact. Motor strength is felt to be 5 over 5 bilaterally. No clonus or tremor. Good motor coordination. Lymphatic: No axillary or cervical lymphadenopathy  MUSCULOSKELETAL: Per examination involves the right lower extremity.  There is a well-healed surgical incision to the anterior aspect of the right knee.  No gross knee effusion is appreciated.  Relative varus alignment is  noted.  Mild medial opening is noted to valgus stress.  Range of motion is greater than 100 degrees without pain.  No erythema about the knee.  There is no gross tenderness to palpation to the anterior aspect of the knee.  There is some tenderness to palpation along the popliteal region more so to the calf.  There is significant ecchymosis to the right lower extremity extending to the foot and ankle.  Some erythema is noted to the anterior aspect of the lower leg.  Assessment: Right lower extremity erythema and swelling Fluid collection in the right popliteal fossa  Plan: Findings are suggestive of a right popliteal cyst with possible rupture. This could result in a significant inflammatory response causing swelling and erythema. I recommend elevation of the lower extremity and cold therapy.  Differential diagnosis also includes hematoma, abscess, or superficial cellulitis. MRI is an option, but scatter from the metal implants may affect the soft tissue definition around the knee. Ultrasound guided aspiration of the popliteal fluid collection has apparently been scheduled.  Mrk Buzby P. Holley Bouche M.D.

## 2020-12-01 NOTE — Progress Notes (Signed)
Pharmacy Antibiotic Note  Kayla Chan is a 87 y.o. female admitted on 12/01/2020 with cellulitis.  Pharmacy has been consulted for Cefepime and Vancomycin dosing.  Plan: Vancomycin 2000mg  IV loading dose followed by Vancomycin 1250 mg IV Q 48 hrs. Goal AUC 400-550. Expected AUC: 477.8 SCr used: 1.17 Expected Cmin: 11.0  Cefepime 2g IV q12h    Temp (24hrs), Avg:97.8 F (36.6 C), Min:97.8 F (36.6 C), Max:97.8 F (36.6 C)  Recent Labs  Lab 12/01/20 1352  WBC 9.0  CREATININE 1.17*    CrCl cannot be calculated (Unknown ideal weight.).    Allergies  Allergen Reactions  . Lisinopril     Intense itching per patient can not tolerate   . Amlodipine     dizziness  . Atorvastatin     Other reaction(s): Muscle Pain  . Colestid  [Colestipol Hcl]     Itching, insomnia  . Crestor  [Rosuvastatin Calcium]     Other reaction(s): Muscle Pain  . Doxycycline   . Fluvastatin Sodium     Other reaction(s): Muscle Pain  . Furosemide     Severe Cramping  . Losartan     itching  . Pravastatin Sodium     Numbness in legs  . Verapamil     Shortness of breath, swelling, palpations.   . Allopurinol Other (See Comments)    dizziness  . Penicillins Rash    Took for pneumonia when she was 73 and mader her mouth break out    Antimicrobials this admission: Cefepime 4/10 >>  Vancomycin 4/10 >>   Dose adjustments this admission:  Microbiology results:  Thank you for allowing pharmacy to be a part of this patient's care.  Paulina Fusi, PharmD, BCPS 12/01/2020 6:07 PM

## 2020-12-02 ENCOUNTER — Observation Stay: Payer: PPO

## 2020-12-02 DIAGNOSIS — Z8052 Family history of malignant neoplasm of bladder: Secondary | ICD-10-CM | POA: Diagnosis not present

## 2020-12-02 DIAGNOSIS — Z881 Allergy status to other antibiotic agents status: Secondary | ICD-10-CM | POA: Diagnosis not present

## 2020-12-02 DIAGNOSIS — L02611 Cutaneous abscess of right foot: Secondary | ICD-10-CM | POA: Diagnosis present

## 2020-12-02 DIAGNOSIS — T148XXA Other injury of unspecified body region, initial encounter: Secondary | ICD-10-CM | POA: Diagnosis not present

## 2020-12-02 DIAGNOSIS — M7121 Synovial cyst of popliteal space [Baker], right knee: Secondary | ICD-10-CM | POA: Diagnosis not present

## 2020-12-02 DIAGNOSIS — M660 Rupture of popliteal cyst: Secondary | ICD-10-CM | POA: Diagnosis not present

## 2020-12-02 DIAGNOSIS — Z823 Family history of stroke: Secondary | ICD-10-CM | POA: Diagnosis not present

## 2020-12-02 DIAGNOSIS — Z888 Allergy status to other drugs, medicaments and biological substances status: Secondary | ICD-10-CM | POA: Diagnosis not present

## 2020-12-02 DIAGNOSIS — Z79890 Hormone replacement therapy (postmenopausal): Secondary | ICD-10-CM | POA: Diagnosis not present

## 2020-12-02 DIAGNOSIS — E785 Hyperlipidemia, unspecified: Secondary | ICD-10-CM | POA: Diagnosis present

## 2020-12-02 DIAGNOSIS — Z96651 Presence of right artificial knee joint: Secondary | ICD-10-CM | POA: Diagnosis not present

## 2020-12-02 DIAGNOSIS — L03115 Cellulitis of right lower limb: Secondary | ICD-10-CM

## 2020-12-02 DIAGNOSIS — Z6841 Body Mass Index (BMI) 40.0 and over, adult: Secondary | ICD-10-CM | POA: Diagnosis not present

## 2020-12-02 DIAGNOSIS — I129 Hypertensive chronic kidney disease with stage 1 through stage 4 chronic kidney disease, or unspecified chronic kidney disease: Secondary | ICD-10-CM | POA: Diagnosis present

## 2020-12-02 DIAGNOSIS — Z880 Allergy status to penicillin: Secondary | ICD-10-CM | POA: Diagnosis not present

## 2020-12-02 DIAGNOSIS — I10 Essential (primary) hypertension: Secondary | ICD-10-CM | POA: Diagnosis not present

## 2020-12-02 DIAGNOSIS — N1832 Chronic kidney disease, stage 3b: Secondary | ICD-10-CM | POA: Diagnosis present

## 2020-12-02 DIAGNOSIS — L03119 Cellulitis of unspecified part of limb: Secondary | ICD-10-CM | POA: Diagnosis present

## 2020-12-02 DIAGNOSIS — Z20822 Contact with and (suspected) exposure to covid-19: Secondary | ICD-10-CM | POA: Diagnosis present

## 2020-12-02 DIAGNOSIS — Z79899 Other long term (current) drug therapy: Secondary | ICD-10-CM | POA: Diagnosis not present

## 2020-12-02 DIAGNOSIS — Z7982 Long term (current) use of aspirin: Secondary | ICD-10-CM | POA: Diagnosis not present

## 2020-12-02 DIAGNOSIS — Z8051 Family history of malignant neoplasm of kidney: Secondary | ICD-10-CM | POA: Diagnosis not present

## 2020-12-02 DIAGNOSIS — R739 Hyperglycemia, unspecified: Secondary | ICD-10-CM | POA: Diagnosis present

## 2020-12-02 DIAGNOSIS — E039 Hypothyroidism, unspecified: Secondary | ICD-10-CM | POA: Diagnosis not present

## 2020-12-02 DIAGNOSIS — Z85828 Personal history of other malignant neoplasm of skin: Secondary | ICD-10-CM | POA: Diagnosis not present

## 2020-12-02 LAB — COMPREHENSIVE METABOLIC PANEL
ALT: 20 U/L (ref 0–44)
AST: 25 U/L (ref 15–41)
Albumin: 2.9 g/dL — ABNORMAL LOW (ref 3.5–5.0)
Alkaline Phosphatase: 98 U/L (ref 38–126)
Anion gap: 7 (ref 5–15)
BUN: 29 mg/dL — ABNORMAL HIGH (ref 8–23)
CO2: 22 mmol/L (ref 22–32)
Calcium: 8.4 mg/dL — ABNORMAL LOW (ref 8.9–10.3)
Chloride: 110 mmol/L (ref 98–111)
Creatinine, Ser: 1.15 mg/dL — ABNORMAL HIGH (ref 0.44–1.00)
GFR, Estimated: 46 mL/min — ABNORMAL LOW (ref >60)
Glucose, Bld: 116 mg/dL — ABNORMAL HIGH (ref 70–99)
Potassium: 4 mmol/L (ref 3.5–5.1)
Sodium: 139 mmol/L (ref 135–145)
Total Bilirubin: 0.8 mg/dL (ref 0.3–1.2)
Total Protein: 6 g/dL — ABNORMAL LOW (ref 6.5–8.1)

## 2020-12-02 LAB — GLUCOSE, CAPILLARY
Glucose-Capillary: 117 mg/dL — ABNORMAL HIGH (ref 70–99)
Glucose-Capillary: 121 mg/dL — ABNORMAL HIGH (ref 70–99)

## 2020-12-02 LAB — CBC
HCT: 36.2 % (ref 36.0–46.0)
Hemoglobin: 11.7 g/dL — ABNORMAL LOW (ref 12.0–15.0)
MCH: 30.9 pg (ref 26.0–34.0)
MCHC: 32.3 g/dL (ref 30.0–36.0)
MCV: 95.5 fL (ref 80.0–100.0)
Platelets: 319 10*3/uL (ref 150–400)
RBC: 3.79 MIL/uL — ABNORMAL LOW (ref 3.87–5.11)
RDW: 15.4 % (ref 11.5–15.5)
WBC: 8.5 10*3/uL (ref 4.0–10.5)
nRBC: 0 % (ref 0.0–0.2)

## 2020-12-02 LAB — C-REACTIVE PROTEIN
CRP: 7 mg/dL — ABNORMAL HIGH (ref <1.0)
CRP: 7 mg/dL — ABNORMAL HIGH (ref <1.0)

## 2020-12-02 LAB — SEDIMENTATION RATE: Sed Rate: 43 mm/hr — ABNORMAL HIGH (ref 0–30)

## 2020-12-02 MED ORDER — HYDRALAZINE HCL 20 MG/ML IJ SOLN: 10.0000 mg | INTRAMUSCULAR | Status: DC | PRN

## 2020-12-02 MED ORDER — ENOXAPARIN SODIUM 60 MG/0.6ML ~~LOC~~ SOLN
0.5000 mg/kg | SUBCUTANEOUS | Status: DC
  Administered 2020-12-02 – 2020-12-04 (×3): 55 mg via SUBCUTANEOUS
  Filled 2020-12-02 (×3): qty 0.6

## 2020-12-02 MED ORDER — METHYLPREDNISOLONE ACETATE 80 MG/ML IJ SUSP
INTRAMUSCULAR | Status: AC
  Filled 2020-12-02: qty 2

## 2020-12-02 NOTE — Progress Notes (Signed)
PROGRESS NOTE    Kayla Chan  PIR:[id number] DOB: Mar 19, 1932 DOA: 12/01/2020 PCP: Kayla Sons, MD  Brief Narrative:  87 y.o. female with medical history significant for hypertension, obesity, constipation, presents to the emergency department for chief concerns of right lower extremity pain and swelling.  [name] states that she fell when leaving a restaurant approximately 3 weeks ago.  She was initially given prednisone by her orthopedic doctor.  She was then prescribed ciprofloxacin 500 mg twice daily, for 7 days, by his PCP on 11/29/2020.  She has been compliant with the medication however her right lower extremity has continued to worsen.  This prompted her to present to the emergency department for further evaluation.  On admission concern for infected Baker's cyst.  Orthopedic surgery and IR were consulted.  Orthopedic surgery recommends nonoperative management with lower extremity elevation and cold therapy.  Interventional radiology attempted aspiration of cyst however trace amount of fluid so no arthrocentesis was attempted.   Assessment & Plan:   Active Problems:   Cellulitis and abscess of foot  Right lower extremity cellulitis Concern for possible ruptured Baker's cyst Causing significant amount of pain Orthopedic surgery recommends nonoperative management with leg elevation and cold compress Ultrasound visualization revealed only small amount of fluid so no arthrocentesis was performed Plan: Continue antibiotics Monitor cultures Can likely de-escalate antibiotic therapy within 24 hours As needed pain control  Hypothyroidism Synthroid 75 mcg daily  Hypertension Home aliskiren 250 mg daily  Hyperglycemia Nondiabetic Suspect recent steroid use is because No need for sliding scale insulin  CKD 3a Creatinine at baseline   DVT prophylaxis: SQ Lovenox Code Status: Full Family Communication: Attempted to call spouse Kayla Chan [phone] on 4/11.  No  answer, voicemail box not set up Disposition Plan: Status is: Inpatient  Remains inpatient appropriate because:Inpatient level of care appropriate due to severity of illness   Dispo: The patient is from: Home              Anticipated d/c is to: Home              Patient currently is not medically stable to d/c.   Difficult to place patient No  Cellulitis with suspected ruptured/infected Baker's cyst.  Given minimal amount of fluid on attempted arthrocentesis no anticipated procedures.  We will proceed with antibiotics and monitor clinical exam going forward.     Level of care: Med-Surg  Consultants:   Orthopedics  Procedures:   None  Antimicrobials:   Vancomycin  Cefepime   Subjective: Patient seen and examined.  Continues to endorse pain and right lower extremity.  No other complaints.  Objective: Vitals:   12/01/20 2054 12/02/20 0342 12/02/20 0809 12/02/20 1131  BP: (!) 149/60 135/69 135/68 (!) 161/62  Pulse: 81 78 68 72  Resp:  18 18 18   Temp: 98.3 F (36.8 C) 98.3 F (36.8 C) 97.6 F (36.4 C)   TempSrc:      SpO2: 99% 95% 98% 99%  Weight:      Height:        Intake/Output Summary (Last 24 hours) at 12/02/2020 1316 Last data filed at 12/02/2020 0622 Gross per 24 hour  Intake 100 ml  Output --  Net 100 ml   Filed Weights   12/01/20 1852  Weight: 109.3 kg    Examination:  General exam: Appears calm and comfortable  Respiratory system: Clear to auscultation. Respiratory effort normal. Cardiovascular system: S1 & S2 heard, RRR. No JVD, murmurs, rubs, gallops or  clicks. No pedal edema. Gastrointestinal system: Abdomen is nondistended, soft and nontender. No organomegaly or masses felt. Normal bowel sounds heard. Central nervous system: Alert and oriented. No focal neurological deficits. Extremities: Symmetric 5 x 5 power. Skin: No rashes, lesions or ulcers Psychiatry: Judgement and insight appear normal. Mood & affect appropriate.     Data  Reviewed: I have personally reviewed following labs and imaging studies  CBC: Recent Labs  Lab 12/01/20 1352 12/02/20 0527  WBC 9.0 8.5  NEUTROABS 6.4  --   HGB 13.1 11.7*  HCT 40.6 36.2  MCV 96.2 95.5  PLT 338 650   Basic Metabolic Panel: Recent Labs  Lab 12/01/20 1352 12/02/20 0527  NA 139 139  K 4.4 4.0  CL 108 110  CO2 25 22  GLUCOSE 155* 116*  BUN 25* 29*  CREATININE 1.17* 1.15*  CALCIUM 8.8* 8.4*   GFR: Estimated Creatinine Clearance: 39.4 mL/min (A) (by C-G formula based on SCr of 1.15 mg/dL (H)). Liver Function Tests: Recent Labs  Lab 12/01/20 1352 12/02/20 0527  AST 33 25  ALT 16 20  ALKPHOS 99 98  BILITOT 1.3* 0.8  PROT 7.1 6.0*  ALBUMIN 3.5 2.9*   No results for input(s): LIPASE, AMYLASE in the last 168 hours. No results for input(s): AMMONIA in the last 168 hours. Coagulation Profile: No results for input(s): INR, PROTIME in the last 168 hours. Cardiac Enzymes: Recent Labs  Lab 12/01/20 1733  CKTOTAL 34*   BNP (last 3 results) No results for input(s): PROBNP in the last 8760 hours. HbA1C: No results for input(s): HGBA1C in the last 72 hours. CBG: Recent Labs  Lab 12/02/20 0054 12/02/20 0809  GLUCAP 121* 117*   Lipid Profile: No results for input(s): CHOL, HDL, LDLCALC, TRIG, CHOLHDL, LDLDIRECT in the last 72 hours. Thyroid Function Tests: No results for input(s): TSH, T4TOTAL, FREET4, T3FREE, THYROIDAB in the last 72 hours. Anemia Panel: No results for input(s): VITAMINB12, FOLATE, FERRITIN, TIBC, IRON, RETICCTPCT in the last 72 hours. Sepsis Labs: No results for input(s): PROCALCITON, LATICACIDVEN in the last 168 hours.  Recent Results (from the past 240 hour(s))  Resp Panel by RT-PCR (Flu A&B, Covid) Nasopharyngeal Swab     Status: None   Collection Time: 12/01/20  3:26 PM   Specimen: Nasopharyngeal Swab; Nasopharyngeal(NP) swabs in vial transport medium  Result Value Ref Range Status   SARS Coronavirus 2 by RT PCR NEGATIVE  NEGATIVE Final    Comment: (NOTE) SARS-CoV-2 target nucleic acids are NOT DETECTED.  The SARS-CoV-2 RNA is generally detectable in upper respiratory specimens during the acute phase of infection. The lowest concentration of SARS-CoV-2 viral copies this assay can detect is 138 copies/mL. A negative result does not preclude SARS-Cov-2 infection and should not be used as the sole basis for treatment or other patient management decisions. A negative result may occur with  improper specimen collection/handling, submission of specimen other than nasopharyngeal swab, presence of viral mutation(s) within the areas targeted by this assay, and inadequate number of viral copies(<138 copies/mL). A negative result must be combined with clinical observations, patient history, and epidemiological information. The expected result is Negative.  Fact Sheet for Patients:  EntrepreneurPulse.com.au  Fact Sheet for Healthcare Providers:  IncredibleEmployment.be  This test is no t yet approved or cleared by the [place] FDA and  has been authorized for detection and/or diagnosis of SARS-CoV-2 by FDA under an Emergency Use Authorization (EUA). This EUA will remain  in effect (meaning this test can be  used) for the duration of the COVID-19 declaration under Section 564(b)(1) of the Act, 21 U.S.C.section 360bbb-3(b)(1), unless the authorization is terminated  or revoked sooner.       Influenza A by PCR NEGATIVE NEGATIVE Final   Influenza B by PCR NEGATIVE NEGATIVE Final    Comment: (NOTE) The Xpert Xpress SARS-CoV-2/FLU/RSV plus assay is intended as an aid in the diagnosis of influenza from Nasopharyngeal swab specimens and should not be used as a sole basis for treatment. Nasal washings and aspirates are unacceptable for Xpert Xpress SARS-CoV-2/FLU/RSV testing.  Fact Sheet for Patients: EntrepreneurPulse.com.au  Fact Sheet for Healthcare  Providers: IncredibleEmployment.be  This test is not yet approved or cleared by the [place] FDA and has been authorized for detection and/or diagnosis of SARS-CoV-2 by FDA under an Emergency Use Authorization (EUA). This EUA will remain in effect (meaning this test can be used) for the duration of the COVID-19 declaration under Section 564(b)(1) of the Act, 21 U.S.C. section 360bbb-3(b)(1), unless the authorization is terminated or revoked.  Performed at [facility], [address] [place], [place] [address]          Radiology Studies: DG Ankle Complete Right  Result Date: 12/01/2020 CLINICAL DATA:  87 year old female with right lower extremity swelling hand pain. EXAM: RIGHT ANKLE - COMPLETE 3+ VIEW COMPARISON:  Right lower extremity radiograph dated 12/26/2019. FINDINGS: There is no acute fracture or dislocation. There is osteopenia. The ankle mortise is intact. Mild spurring from the dorsal anterior talus. There is diffuse skin thickening and subcutaneous soft tissue edema. No radiopaque foreign object or soft tissue gas. IMPRESSION: 1. No acute fracture or dislocation. 2. Diffuse skin thickening and subcutaneous soft tissue edema. Electronically Signed   By: Anner Crete M.D.   On: 12/01/2020 14:58   US Venous Img Lower Unilateral Right  Result Date: 12/01/2020 CLINICAL DATA:  87 year old female with right lower extremity swelling. EXAM: RIGHT LOWER EXTREMITY VENOUS DOPPLER ULTRASOUND TECHNIQUE: Gray-scale sonography with graded compression, as well as color Doppler and duplex ultrasound were performed to evaluate the right lower extremity deep venous systems from the level of the common femoral vein and including the common femoral, femoral, profunda femoral, popliteal and calf veins including the posterior tibial, peroneal and gastrocnemius veins when visible. Spectral Doppler was utilized to evaluate flow at rest and with distal augmentation  maneuvers in the common femoral, femoral and popliteal veins. The contralateral common femoral vein was also evaluated for comparison. COMPARISON:  11/27/2020 FINDINGS: RIGHT LOWER EXTREMITY Common Femoral Vein: No evidence of thrombus. Normal compressibility, respiratory phasicity and response to augmentation. Central Greater Saphenous Vein: No evidence of thrombus. Normal compressibility and flow on color Doppler imaging. Central Profunda Femoral Vein: No evidence of thrombus. Normal compressibility and flow on color Doppler imaging. Femoral Vein: No evidence of thrombus. Normal compressibility, respiratory phasicity and response to augmentation. Popliteal Vein: No evidence of thrombus. Normal compressibility, respiratory phasicity and response to augmentation. Calf Veins: No evidence of thrombus. Normal compressibility and flow on color Doppler imaging. Other Findings: Simple appearing fluid collection in the right popliteal fossa measuring approximately 6.1 x 1.3 x 1.6 cm with ill-defined extension into the medial aspect of the calf. LEFT LOWER EXTREMITY Common Femoral Vein: No evidence of thrombus. Normal compressibility, respiratory phasicity and response to augmentation. IMPRESSION: 1. No evidence of right lower extremity deep vein thrombosis. 2. Simple appearing fluid collection right popliteal fossa measuring up to approximately 6.1 cm with extension into the right medial calf. These  findings are concerning for Baker's cyst with possible rupture. Hematoma could appear similarly. Right lower extremity MRI could be obtained for further characterization as clinically indicated. Ruthann Cancer, MD [facility] Electronically Signed   By: Ruthann Cancer MD   On: 12/01/2020 14:57   [place] RT LOWER EXTREM LTD SOFT TISSUE NON VASCULAR  Result Date: 12/02/2020 CLINICAL DATA:  History of right total knee replacement now with indeterminate fluid collection with  the right popliteal fossa suggestive of a Baker's cyst. Please perform ultrasound-guided aspiration for tissue diagnostic purposes. Note, patient admitted with right lower extremity erythema following fall however the affected area of apparent cellulitis is not associated with her knee or the suspected Baker's cyst. EXAM: ULTRASOUND RIGHT LOWER EXTREMITY LIMITED TECHNIQUE: Ultrasound examination of the lower extremity soft tissues was performed in the area of clinical concern. COMPARISON:  Right lower extremity venous Doppler ultrasound-12/01/2020 FINDINGS: Sonographic evaluation of the right popliteal fossa failed to delineate a significantly sized right-sided Baker's cyst, though note, evaluation degraded secondary to patient body habitus and inability to lie prone. As such, ultrasound-guided aspiration was not attempted. IMPRESSION: Suspected trace right-sided Baker's cyst too small to warrant attempted aspiration especially in the setting of regional cellulitis. No aspiration attempted. Electronically Signed   By: Sandi Mariscal M.D.   On: 12/02/2020 12:30        Scheduled Meds: . aliskiren  150 mg Oral Daily  . ezetimibe  10 mg Oral Daily  . levothyroxine  75 mcg Oral Q0600  . methylPREDNISolone acetate       Continuous Infusions: . ceFEPime (MAXIPIME) IV 2 g (12/02/20 1016)  . [START ON 12/03/2020] vancomycin       LOS: 0 days    Time spent: 25 minutes    Sidney Ace, MD [facility] Pager [phone]  If 7PM-7AM, please contact night-coverage 12/02/2020, 1:16 PM

## 2020-12-02 NOTE — Consult Note (Addendum)
Chief Complaint: Ruptured right sided Bakers Cyst. Request is for aspiration and steroid injection  Referring Physician(s): Dr. Bea Laura  (Dr Marry Guan)  Supervising Physician: Sandi Mariscal  Patient Status: [place] - In-pt  History of Present Illness: Kayla Chan is a 87 y.o. female inpatient. History of HTN, HLD, Basal cell carcinoma on nose.. Presented to the ED at [facility] with injuries related to fall off a curb that occurred appropriately 1 week prior. Patient reporting persistent and worsening RLE edema with erythema. Patient was seen by PMD and preserved antibiotics for cellulitis however the condition worsened. . US venous to rule out DVT from 4.10.22 reads Simple appearing fluid collection right popliteal fossa measuring up to approximately 6.1 cm with extension into the right medial calf. These findings are concerning for Baker's cyst with possible rupture. Hematoma could appear similarly. Right lower extremity MRI could be obtained for further characterization as clinically indicated. Of note the patient had a right total knee arthroplasty on 1.17.13. Team is requesting an aspiration of the right sided Baker's cyst with steroid injection. Injection confirmed with Dr. Marry Guan in orthopedics.  Currently without any significant complaints. Patient alert and laying in bed, calm and comfortable. Denies any fevers, headache, chest pain, SOB, cough, abdominal pain, nausea, vomiting or bleeding. Endorses pain to right leg. Return precautions and treatment recommendations and follow-up discussed with the patient who is agreeable with the plan.    Past Medical History:  Diagnosis Date  . Cancer ([place])    BCC on nose  . Hyperlipidemia   . Hypertension     Past Surgical History:  Procedure Laterality Date  . 24 hour Holter Monitor  01/2013   4550 ectopic beats with 344 superventricular bigemminy events  . Abdominal Ultrasound  05/31/2013   RUQ. Fatty Liver  . APPENDECTOMY  1950   [place]   . 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, [place]; Right    Allergies: Lisinopril, Amlodipine, Atorvastatin, Colestid  [colestipol hcl], Crestor  [rosuvastatin calcium], Doxycycline, Fluvastatin sodium, Furosemide, Losartan, Pravastatin sodium, Verapamil, Allopurinol, and Penicillins  Medications: Prior to Admission medications   Medication Sig Start Date End Date Taking? Authorizing Provider  aliskiren (TEKTURNA) 150 MG tablet Take 1 tablet (150 mg total) by mouth daily. 11/26/20   Birdie Sons, MD  allopurinol (ZYLOPRIM) 100 MG tablet Take 1 tablet (100 mg total) by mouth daily. 10/21/20   Birdie Sons, MD  Artificial Tear Ointment (DRY EYES OP) Place 1 drop into the left eye as needed.    [provider]  aspirin 81 MG tablet Take 81 mg by mouth daily.    [provider]  ciprofloxacin (CIPRO) 500 MG tablet Take 1 tablet (500 mg total) by mouth 2 (two) times daily for 7 days. 11/29/20 12/06/20  Birdie Sons, MD  colchicine 0.6 MG tablet Take 1 tablet (0.6 mg total) by mouth daily as needed (gout). 05/08/20   Birdie Sons, MD  diclofenac (CATAFLAM) 50 MG tablet TAKE 1 TABLET (50 MG TOTAL) BY MOUTH 2 (TWO) TIMES DAILY AS NEEDED (FOOT PAIN). 05/21/20   Birdie Sons, MD  diclofenac Sodium (VOLTAREN) 1 % GEL Apply 2 g topically 4 (four) times daily. 11/05/20   Trinna Post, PA-C  ezetimibe (ZETIA) 10 MG tablet TAKE 1 TABLET BY MOUTH EVERY DAY IN THE MORNING 10/28/20   Birdie Sons, MD  furosemide (LASIX) 20 MG tablet Take 0.5 tablets (10 mg total) by mouth in the morning. 07/23/20  Birdie Sons, MD  hydrocortisone 2.5 % lotion Apply to face once daily as needed for itchiness 05/02/18   [provider]  hydrOXYzine (ATARAX/VISTARIL) 10 MG tablet Take 1 tablet (10 mg total) by mouth every 8 (eight) hours as needed for itching. 07/09/20   Birdie Sons, MD  ketoconazole (NIZORAL) 2 % shampoo Apply topically. 05/02/18    [provider]  levothyroxine (SYNTHROID) 75 MCG tablet TAKE 1 TABLET BY MOUTH EVERY DAY 10/28/20   Birdie Sons, MD  meloxicam (MOBIC) 15 MG tablet Take 1 tablet (15 mg total) by mouth daily. 12/26/19   Edrick Kins, DPM  mometasone (ELOCON) 0.1 % ointment APPLY 1 APPLICATION DAILY AS NEEDED TOPICALLY. APPLY TO AFFECTED AREA 05/13/20   Birdie Sons, MD  Multiple Vitamins-Minerals (MULTIVITAMIN ADULT PO) Take 1 tablet by mouth daily. 10/11/08   [provider]  naproxen (NAPROSYN) 500 MG tablet Take 1 tablet (500 mg total) by mouth 2 (two) times daily as needed for up to 15 days. 11/29/20 12/14/20  Birdie Sons, MD  pimecrolimus (ELIDEL) 1 % cream Apply topically 2 (two) times daily. 05/31/19   Birdie Sons, MD  sulfacetamide (BLEPH-10) 10 % ophthalmic solution Place 1 drop into the right eye every 4 (four) hours. 07/10/20   Birdie Sons, MD  tacrolimus (PROTOPIC) 0.1 % ointment Apply to itching on face twice daily 01/08/20   [provider]     Family History  Problem Relation Age of Onset  . Stroke Mother        possible stroke  . Heart Problems Father   . Kidney cancer Sister   . Bladder Cancer Brother     Social History   Socioeconomic History  . Marital status: Married    Spouse name: Not on file  . Number of children: 0  . Years of education: Not on file  . Highest education level: Some college, no degree  Occupational History  . Occupation: Retired  Tobacco Use  . Smoking status: Never Smoker  . Smokeless tobacco: Never Used  Vaping Use  . Vaping Use: Never used  Substance and Sexual Activity  . Alcohol use: Yes    Alcohol/week: 0.0 standard drinks    Comment: 1 glass of wine seldom  . Drug use: No  . Sexual activity: Not on file  Other Topics Concern  . Not on file  Social History Narrative  . Not on file   Social Determinants of Health   Financial Resource Strain: Low Risk   . Difficulty of Paying Living Expenses: Not  hard at all  Food Insecurity: No Food Insecurity  . Worried About Charity fundraiser in the Last Year: Never true  . Ran Out of Food in the Last Year: Never true  Transportation Needs: No Transportation Needs  . Lack of Transportation (Medical): No  . Lack of Transportation (Non-Medical): No  Physical Activity: Inactive  . Days of Exercise per Week: 0 days  . Minutes of Exercise per Session: 0 min  Stress: No Stress Concern Present  . Feeling of Stress : Not at all  Social Connections: Moderately Integrated  . Frequency of Communication with Friends and Family: More than three times a week  . Frequency of Social Gatherings with Friends and Family: More than three times a week  . Attends Religious Services: More than 4 times per year  . Active Member of Clubs or Organizations: No  . Attends Archivist Meetings:  Never  . Marital Status: Married     Review of Systems: A 12 point ROS discussed and pertinent positives are indicated in the HPI above.  All other systems are negative.  Review of Systems  Constitutional: Negative for fatigue and fever.  HENT: Negative for congestion.   Respiratory: Negative for cough and shortness of breath.   Gastrointestinal: Negative for abdominal pain, diarrhea, nausea and vomiting.  Musculoskeletal: Positive for myalgias ( pain to right leg).    Vital Signs: BP 135/68 (BP Location: Left Arm)   Pulse 68   Temp 97.6 F (36.4 C)   Resp 18   Ht 5' (1.524 m)   Wt 241 lb (109.3 kg)   SpO2 98%   BMI 47.07 kg/m   Physical Exam Vitals and nursing note reviewed.  Constitutional:      Appearance: She is well-developed.  HENT:     Head: Normocephalic and atraumatic.  Eyes:     Conjunctiva/sclera: Conjunctivae normal.  Cardiovascular:     Rate and Rhythm: Normal rate and regular rhythm.     Heart sounds: Normal heart sounds.  Pulmonary:     Effort: Pulmonary effort is normal.     Breath sounds: Normal breath sounds.   Musculoskeletal:        General: Swelling and tenderness present. Normal range of motion.     Cervical back: Normal range of motion.     Right lower leg: Edema present.     Comments: erythema and ecchyamosis noted to right extremity.   Skin:    General: Skin is warm.  Neurological:     Mental Status: She is alert and oriented to person, place, and time.     Imaging: DG Ankle Complete Right  Result Date: 12/01/2020 CLINICAL DATA:  87 year old female with right lower extremity swelling hand pain. EXAM: RIGHT ANKLE - COMPLETE 3+ VIEW COMPARISON:  Right lower extremity radiograph dated 12/26/2019. FINDINGS: There is no acute fracture or dislocation. There is osteopenia. The ankle mortise is intact. Mild spurring from the dorsal anterior talus. There is diffuse skin thickening and subcutaneous soft tissue edema. No radiopaque foreign object or soft tissue gas. IMPRESSION: 1. No acute fracture or dislocation. 2. Diffuse skin thickening and subcutaneous soft tissue edema. Electronically Signed   By: Anner Crete M.D.   On: 12/01/2020 14:58   US Venous Img Lower Unilateral Right  Result Date: 12/01/2020 CLINICAL DATA:  87 year old female with right lower extremity swelling. EXAM: RIGHT LOWER EXTREMITY VENOUS DOPPLER ULTRASOUND TECHNIQUE: Gray-scale sonography with graded compression, as well as color Doppler and duplex ultrasound were performed to evaluate the right lower extremity deep venous systems from the level of the common femoral vein and including the common femoral, femoral, profunda femoral, popliteal and calf veins including the posterior tibial, peroneal and gastrocnemius veins when visible. Spectral Doppler was utilized to evaluate flow at rest and with distal augmentation maneuvers in the common femoral, femoral and popliteal veins. The contralateral common femoral vein was also evaluated for comparison. COMPARISON:  11/27/2020 FINDINGS: RIGHT LOWER EXTREMITY Common Femoral Vein: No  evidence of thrombus. Normal compressibility, respiratory phasicity and response to augmentation. Central Greater Saphenous Vein: No evidence of thrombus. Normal compressibility and flow on color Doppler imaging. Central Profunda Femoral Vein: No evidence of thrombus. Normal compressibility and flow on color Doppler imaging. Femoral Vein: No evidence of thrombus. Normal compressibility, respiratory phasicity and response to augmentation. Popliteal Vein: No evidence of thrombus. Normal compressibility, respiratory phasicity and response to augmentation. Calf Veins:  No evidence of thrombus. Normal compressibility and flow on color Doppler imaging. Other Findings: Simple appearing fluid collection in the right popliteal fossa measuring approximately 6.1 x 1.3 x 1.6 cm with ill-defined extension into the medial aspect of the calf. LEFT LOWER EXTREMITY Common Femoral Vein: No evidence of thrombus. Normal compressibility, respiratory phasicity and response to augmentation. IMPRESSION: 1. No evidence of right lower extremity deep vein thrombosis. 2. Simple appearing fluid collection right popliteal fossa measuring up to approximately 6.1 cm with extension into the right medial calf. These findings are concerning for Baker's cyst with possible rupture. Hematoma could appear similarly. Right lower extremity MRI could be obtained for further characterization as clinically indicated. Ruthann Cancer, MD [facility] Electronically Signed   By: Ruthann Cancer MD   On: 12/01/2020 14:57   US Venous Img Lower Unilateral Right  Result Date: 11/27/2020 CLINICAL DATA:  Right leg pain and swelling EXAM: RIGHT LOWER EXTREMITY VENOUS DOPPLER ULTRASOUND TECHNIQUE: Gray-scale sonography with compression, as well as color and duplex ultrasound, were performed to evaluate the deep venous system(s) from the level of the common femoral vein through the popliteal and proximal calf veins.  COMPARISON:  None. FINDINGS: VENOUS Normal compressibility of the common femoral, superficial femoral, and popliteal veins, as well as the visualized calf veins. Visualized portions of profunda femoral vein and great saphenous vein unremarkable. No filling defects to suggest DVT on grayscale or color Doppler imaging. Doppler waveforms show normal direction of venous flow, normal respiratory plasticity and response to augmentation. Limited views of the contralateral common femoral vein are unremarkable. OTHER None. Limitations: none IMPRESSION: Negative examination for deep venous thrombosis in the right lower extremity. Electronically Signed   By: Eddie Candle M.D.   On: 11/27/2020 16:05    Labs:  CBC: Recent Labs    04/16/20 1443 08/19/20 1358 12/01/20 1352 12/02/20 0527  WBC 9.7 8.1 9.0 8.5  HGB 13.8 13.7 13.1 11.7*  HCT 42.5 41.6 40.6 36.2  PLT 334 325 338 319    COAGS: No results for input(s): INR, APTT in the last 8760 hours.  BMP: Recent Labs    01/05/20 1150 04/16/20 1443 08/19/20 1358 12/01/20 1352 12/02/20 0527  NA 140 141 142 139 139  K 3.7 4.1 4.3 4.4 4.0  CL 106 103 107* 108 110  CO2 25 24 22 25 22   GLUCOSE 172* 96 103* 155* 116*  BUN 31* 28* 23 25* 29*  CALCIUM 9.3 9.7 9.3 8.8* 8.4*  CREATININE 1.10* 1.21* 1.10* 1.17* 1.15*  GFRNONAA 46* 41* 46* 45* 46*  GFRAA 53* 47* 53*  --   --     LIVER FUNCTION TESTS: Recent Labs    04/16/20 1443 08/19/20 1358 12/01/20 1352 12/02/20 0527  BILITOT 0.2 0.3 1.3* 0.8  AST 18 15 33 25  ALT 11 9 16 20   ALKPHOS 83 77 99 98  PROT 6.8 6.4 7.1 6.0*  ALBUMIN 4.0 3.8 3.5 2.9*   Assessment and Plan:  87 y.o. female inpatient. History of HTN, HLD, Basal cell carcinoma on nose.. Presented to the ED at [facility] with injuries related to fall off a curb that occurred appropriately 1 week prior. Patient reporting persistent and worsening RLE edema with erythema. Patient was seen by PMD and preserved antibiotics for cellulitis  however the condition worsened. US venous to rule out DVt from 4.10.22 reads Simple appearing fluid collection right popliteal fossa measuring up to approximately 6.1 cm with extension into the right  medial calf. These findings are concerning for Baker's cyst with possible rupture. Hematoma could appear similarly. Right lower extremity MRI could be obtained for further characterization as clinically indicated. Of note the patient had a right total knee arthroplasty on 1.17.13. Team is requesting an aspiration of the ruptured Baker's cyst and steroid injection. Confirmed with Dr. Marry Guan in orthopedics.   BUN 29, Cr 1.15, Albumin 2.89, Sed rate 43, CRP is 7.0. All other labs and medications are within acceptable parameters. See list for allergies. Patient has been NPO since midnight.   IR consulted for possible  aspiration of right sided ruptured Baker's  Cyst with  steroid injection. Case has been reviewed and procedure approved by Dr. Owens Shark.  Patient tentatively scheduled for 4.11.22.  IR will call patient when ready.  Risks and benefits discussed with the patient including bleeding, infection, damage to adjacent structures, bowel perforation/fistula connection, and sepsis.  All of the patient's questions were answered, patient is agreeable to proceed. Consent signed and in chart.   Thank you for this interesting consult.  I greatly enjoyed meeting Sharifa Bucholz and look forward to participating in their care.  A copy of this report was sent to the requesting provider on this date.  Electronically Signed: Jacqualine Mau, NP 12/02/2020, 9:39 AM   I spent a total of 40 Minutes    in face to face in clinical consultation, greater than 50% of which was counseling/coordinating care for aspiration of right sided Baker's cyst and steroid injection

## 2020-12-02 NOTE — Procedures (Signed)
Pre Procedure Dx: Right lower extremity cellulits Post Procedural Dx: Same  Sonographic evaluation of the posterior aspect of the knee demonstrates a trace right-sided Baker's cyst, too small to warrant attempted aspiration, especially in the setting of regional cellulitis post right TKR.    No aspiration attempted.  Ronny Bacon, MD Pager #: [phone]

## 2020-12-02 NOTE — Progress Notes (Signed)
PHARMACIST - PHYSICIAN COMMUNICATION  CONCERNING:  Enoxaparin (Lovenox) for DVT Prophylaxis    RECOMMENDATION: Patient was prescribed enoxaparin 40 mg q24 hours for VTE prophylaxis.   Filed Weights   12/01/20 1852  Weight: 109.3 kg (241 lb)    Body mass index is 47.07 kg/m.  Estimated Creatinine Clearance: 39.4 mL/min (A) (by C-G formula based on SCr of 1.15 mg/dL (H)).   Based on [place] patient is candidate for enoxaparin 0.5mg /kg TBW SQ every 24 hours based on BMI >30.  DESCRIPTION: Pharmacy has adjusted enoxaparin dose per [facility] policy.  Patient is now receiving enoxaparin 55 mg every 24 hours    Tawnya Crook, PharmD Clinical Pharmacist  12/02/2020 1:38 PM

## 2020-12-03 LAB — MRSA PCR SCREENING: MRSA by PCR: NEGATIVE

## 2020-12-03 LAB — CREATININE, SERUM
Creatinine, Ser: 1.11 mg/dL — ABNORMAL HIGH (ref 0.44–1.00)
GFR, Estimated: 48 mL/min — ABNORMAL LOW (ref >60)

## 2020-12-03 MED ORDER — VANCOMYCIN HCL 1500 MG/300ML IV SOLN
1500.0000 mg | INTRAVENOUS | Status: DC
  Filled 2020-12-03: qty 300

## 2020-12-03 MED ORDER — SODIUM CHLORIDE 0.9 % IV SOLN
INTRAVENOUS | Status: DC | PRN
  Administered 2020-12-03 – 2020-12-04 (×2): 250 mL via INTRAVENOUS

## 2020-12-03 NOTE — Progress Notes (Signed)
PROGRESS NOTE    Kayla Chan  TGY:[id number] DOB: September 19, 1931 DOA: 12/01/2020 PCP: Birdie Sons, MD  Brief Narrative:  87 y.o. female with medical history significant for hypertension, obesity, constipation, presents to the emergency department for chief concerns of right lower extremity pain and swelling.  Kayla Chan states that she fell when leaving a restaurant approximately 3 weeks ago.  She was initially given prednisone by her orthopedic doctor.  She was then prescribed ciprofloxacin 500 mg twice daily, for 7 days, by his PCP on 11/29/2020.  She has been compliant with the medication however her right lower extremity has continued to worsen.  This prompted her to present to the emergency department for further evaluation.  On admission concern for infected Baker's cyst.  Orthopedic surgery and IR were consulted.  Orthopedic surgery recommends nonoperative management with lower extremity elevation and cold therapy.  Interventional radiology attempted aspiration of cyst however trace amount of fluid so no arthrocentesis was attempted.   Assessment & Plan:   Active Problems:   Cellulitis and abscess of foot   Lower extremity cellulitis  Right lower extremity cellulitis Concern for possible ruptured Baker's cyst Causing significant amount of pain Orthopedic surgery recommends nonoperative management with leg elevation and cold compress Ultrasound visualization revealed only small amount of fluid so no arthrocentesis was performed -Cellulitic change improving over interval Plan: Check MRSA screen.  If negative can DC Vanco Continue cefepime for now, can likely de-escalate further if exam continues to improve Monitor vitals and fever curve Extremity elevation Cold compress Pain control as needed  Hypothyroidism Synthroid 75 mcg daily  Hypertension Home aliskiren 250 mg daily  Hyperglycemia Nondiabetic Suspect recent steroid use is because No need for sliding scale  insulin  CKD 3a Creatinine at baseline   DVT prophylaxis: SQ Lovenox Code Status: Full Family Communication: spouse Kayla Chan [phone] on 4/12 Disposition Plan: Status is: Inpatient  Remains inpatient appropriate because:Inpatient level of care appropriate due to severity of illness   Dispo: The patient is from: Home              Anticipated d/c is to: Home              Patient currently is not medically stable to d/c.   Difficult to place patient No  Cellulitis with suspected ruptured/infected Baker's cyst.  Given minimal amount of fluid on attempted arthrocentesis no anticipated procedures.  Continue IV antibiotics for at least the next 24 hours.  If exam continues to improve we can consider a transition to an oral antibiotic in preparation for discharge in the next 24 to 48 hours..     Level of care: Med-Surg  Consultants:   Orthopedics  Procedures:   None  Antimicrobials:   Vancomycin  Cefepime   Subjective: Patient seen and examined.  Pain in right lower extremity has improved.  Level of mobility improved.  No fevers over interval.  No other complaints.  Objective: Vitals:   12/02/20 1948 12/02/20 2301 12/03/20 0447 12/03/20 0819  BP: 138/62 (!) 145/74 (!) 148/73 (!) 147/64  Pulse: 83 84 71 77  Resp: 17 18 18 16   Temp: 98.9 F (37.2 C) 98.6 F (37 C) 98.1 F (36.7 C) 97.6 F (36.4 C)  TempSrc:  Oral    SpO2: 97% 98% 93% 97%  Weight:      Height:        Intake/Output Summary (Last 24 hours) at 12/03/2020 1134 Last data filed at 12/03/2020 0302 Gross per 24 hour  Intake 210.3 ml  Output --  Net 210.3 ml   Filed Weights   12/01/20 1852  Weight: 109.3 kg    Examination:  General exam: Appears calm and comfortable  Respiratory system: Clear to auscultation. Respiratory effort normal. Cardiovascular system: S1 & S2 heard, RRR. No JVD, murmurs, rubs, gallops or clicks. No pedal edema. Gastrointestinal system: Abdomen is nondistended, soft  and nontender. No organomegaly or masses felt. Normal bowel sounds heard. Central nervous system: Alert and oriented. No focal neurological deficits. Extremities: Decreased range of motion right lower extremity Skin: Right lower extremity erythematous with cellulitic change, tender to touch, decreased level of mobility Psychiatry: Judgement and insight appear normal. Mood & affect appropriate.     Data Reviewed: I have personally reviewed following labs and imaging studies  CBC: Recent Labs  Lab 12/01/20 1352 12/02/20 0527  WBC 9.0 8.5  NEUTROABS 6.4  --   HGB 13.1 11.7*  HCT 40.6 36.2  MCV 96.2 95.5  PLT 338 283   Basic Metabolic Panel: Recent Labs  Lab 12/01/20 1352 12/02/20 0527 12/03/20 0821  NA 139 139  --   K 4.4 4.0  --   CL 108 110  --   CO2 25 22  --   GLUCOSE 155* 116*  --   BUN 25* 29*  --   CREATININE 1.17* 1.15* 1.11*  CALCIUM 8.8* 8.4*  --    GFR: Estimated Creatinine Clearance: 40.8 mL/min (A) (by C-G formula based on SCr of 1.11 mg/dL (H)). Liver Function Tests: Recent Labs  Lab 12/01/20 1352 12/02/20 0527  AST 33 25  ALT 16 20  ALKPHOS 99 98  BILITOT 1.3* 0.8  PROT 7.1 6.0*  ALBUMIN 3.5 2.9*   No results for input(s): LIPASE, AMYLASE in the last 168 hours. No results for input(s): AMMONIA in the last 168 hours. Coagulation Profile: No results for input(s): INR, PROTIME in the last 168 hours. Cardiac Enzymes: Recent Labs  Lab 12/01/20 1733  CKTOTAL 34*   BNP (last 3 results) No results for input(s): PROBNP in the last 8760 hours. HbA1C: No results for input(s): HGBA1C in the last 72 hours. CBG: Recent Labs  Lab 12/02/20 0054 12/02/20 0809  GLUCAP 121* 117*   Lipid Profile: No results for input(s): CHOL, HDL, LDLCALC, TRIG, CHOLHDL, LDLDIRECT in the last 72 hours. Thyroid Function Tests: No results for input(s): TSH, T4TOTAL, FREET4, T3FREE, THYROIDAB in the last 72 hours. Anemia Panel: No results for input(s): VITAMINB12,  FOLATE, FERRITIN, TIBC, IRON, RETICCTPCT in the last 72 hours. Sepsis Labs: No results for input(s): PROCALCITON, LATICACIDVEN in the last 168 hours.  Recent Results (from the past 240 hour(s))  Resp Panel by RT-PCR (Flu A&B, Covid) Nasopharyngeal Swab     Status: None   Collection Time: 12/01/20  3:26 PM   Specimen: Nasopharyngeal Swab; Nasopharyngeal(NP) swabs in vial transport medium  Result Value Ref Range Status   SARS Coronavirus 2 by RT PCR NEGATIVE NEGATIVE Final    Comment: (NOTE) SARS-CoV-2 target nucleic acids are NOT DETECTED.  The SARS-CoV-2 RNA is generally detectable in upper respiratory specimens during the acute phase of infection. The lowest concentration of SARS-CoV-2 viral copies this assay can detect is 138 copies/mL. A negative result does not preclude SARS-Cov-2 infection and should not be used as the sole basis for treatment or other patient management decisions. A negative result may occur with  improper specimen collection/handling, submission of specimen other than nasopharyngeal swab, presence of viral mutation(s) within the areas targeted  by this assay, and inadequate number of viral copies(<138 copies/mL). A negative result must be combined with clinical observations, patient history, and epidemiological information. The expected result is Negative.  Fact Sheet for Patients:  EntrepreneurPulse.com.au  Fact Sheet for Healthcare Providers:  IncredibleEmployment.be  This test is no t yet approved or cleared by the [place] FDA and  has been authorized for detection and/or diagnosis of SARS-CoV-2 by FDA under an Emergency Use Authorization (EUA). This EUA will remain  in effect (meaning this test can be used) for the duration of the COVID-19 declaration under Section 564(b)(1) of the Act, 21 U.S.C.section 360bbb-3(b)(1), unless the authorization is terminated  or revoked sooner.       Influenza A by PCR  NEGATIVE NEGATIVE Final   Influenza B by PCR NEGATIVE NEGATIVE Final    Comment: (NOTE) The Xpert Xpress SARS-CoV-2/FLU/RSV plus assay is intended as an aid in the diagnosis of influenza from Nasopharyngeal swab specimens and should not be used as a sole basis for treatment. Nasal washings and aspirates are unacceptable for Xpert Xpress SARS-CoV-2/FLU/RSV testing.  Fact Sheet for Patients: EntrepreneurPulse.com.au  Fact Sheet for Healthcare Providers: IncredibleEmployment.be  This test is not yet approved or cleared by the [place] FDA and has been authorized for detection and/or diagnosis of SARS-CoV-2 by FDA under an Emergency Use Authorization (EUA). This EUA will remain in effect (meaning this test can be used) for the duration of the COVID-19 declaration under Section 564(b)(1) of the Act, 21 U.S.C. section 360bbb-3(b)(1), unless the authorization is terminated or revoked.  Performed at [facility], [address], [place], [place] [address]          Radiology Studies: DG Ankle Complete Right  Result Date: 12/01/2020 CLINICAL DATA:  87 year old female with right lower extremity swelling hand pain. EXAM: RIGHT ANKLE - COMPLETE 3+ VIEW COMPARISON:  Right lower extremity radiograph dated 12/26/2019. FINDINGS: There is no acute fracture or dislocation. There is osteopenia. The ankle mortise is intact. Mild spurring from the dorsal anterior talus. There is diffuse skin thickening and subcutaneous soft tissue edema. No radiopaque foreign object or soft tissue gas. IMPRESSION: 1. No acute fracture or dislocation. 2. Diffuse skin thickening and subcutaneous soft tissue edema. Electronically Signed   By: Anner Crete M.D.   On: 12/01/2020 14:58   US Venous Img Lower Unilateral Right  Result Date: 12/01/2020 CLINICAL DATA:  87 year old female with right lower extremity swelling. EXAM: RIGHT LOWER EXTREMITY VENOUS DOPPLER  ULTRASOUND TECHNIQUE: Gray-scale sonography with graded compression, as well as color Doppler and duplex ultrasound were performed to evaluate the right lower extremity deep venous systems from the level of the common femoral vein and including the common femoral, femoral, profunda femoral, popliteal and calf veins including the posterior tibial, peroneal and gastrocnemius veins when visible. Spectral Doppler was utilized to evaluate flow at rest and with distal augmentation maneuvers in the common femoral, femoral and popliteal veins. The contralateral common femoral vein was also evaluated for comparison. COMPARISON:  11/27/2020 FINDINGS: RIGHT LOWER EXTREMITY Common Femoral Vein: No evidence of thrombus. Normal compressibility, respiratory phasicity and response to augmentation. Central Greater Saphenous Vein: No evidence of thrombus. Normal compressibility and flow on color Doppler imaging. Central Profunda Femoral Vein: No evidence of thrombus. Normal compressibility and flow on color Doppler imaging. Femoral Vein: No evidence of thrombus. Normal compressibility, respiratory phasicity and response to augmentation. Popliteal Vein: No evidence of thrombus. Normal compressibility, respiratory phasicity and response to augmentation. Calf Veins: No evidence  of thrombus. Normal compressibility and flow on color Doppler imaging. Other Findings: Simple appearing fluid collection in the right popliteal fossa measuring approximately 6.1 x 1.3 x 1.6 cm with ill-defined extension into the medial aspect of the calf. LEFT LOWER EXTREMITY Common Femoral Vein: No evidence of thrombus. Normal compressibility, respiratory phasicity and response to augmentation. IMPRESSION: 1. No evidence of right lower extremity deep vein thrombosis. 2. Simple appearing fluid collection right popliteal fossa measuring up to approximately 6.1 cm with extension into the right medial calf. These findings are concerning for Baker's cyst with  possible rupture. Hematoma could appear similarly. Right lower extremity MRI could be obtained for further characterization as clinically indicated. Ruthann Cancer, MD [facility] Radiology Electronically Signed   By: Ruthann Cancer MD   On: 12/01/2020 14:57   [place] RT LOWER EXTREM LTD SOFT TISSUE NON VASCULAR  Result Date: 12/02/2020 CLINICAL DATA:  History of right total knee replacement now with indeterminate fluid collection with the right popliteal fossa suggestive of a Baker's cyst. Please perform ultrasound-guided aspiration for tissue diagnostic purposes. Note, patient admitted with right lower extremity erythema following fall however the affected area of apparent cellulitis is not associated with her knee or the suspected Baker's cyst. EXAM: ULTRASOUND RIGHT LOWER EXTREMITY LIMITED TECHNIQUE: Ultrasound examination of the lower extremity soft tissues was performed in the area of clinical concern. COMPARISON:  Right lower extremity venous Doppler ultrasound-12/01/2020 FINDINGS: Sonographic evaluation of the right popliteal fossa failed to delineate a significantly sized right-sided Baker's cyst, though note, evaluation degraded secondary to patient body habitus and inability to lie prone. As such, ultrasound-guided aspiration was not attempted. IMPRESSION: Suspected trace right-sided Baker's cyst too small to warrant attempted aspiration especially in the setting of regional cellulitis. No aspiration attempted. Electronically Signed   By: Sandi Mariscal M.D.   On: 12/02/2020 12:30        Scheduled Meds: . aliskiren  150 mg Oral Daily  . enoxaparin (LOVENOX) injection  0.5 mg/kg Subcutaneous Q24H  . ezetimibe  10 mg Oral Daily  . levothyroxine  75 mcg Oral Q0600   Continuous Infusions: . sodium chloride Stopped (12/03/20 0158)  . ceFEPime (MAXIPIME) IV 2 g (12/03/20 1003)  . vancomycin       LOS: 1 day    Time spent: 25 minutes    Sidney Ace, MD [facility] Pager [phone]  If 7PM-7AM, please contact night-coverage 12/03/2020, 11:34 AM

## 2020-12-03 NOTE — Progress Notes (Signed)
Pharmacy Antibiotic Note  Kayla Chan is a 87 y.o. female admitted on 12/01/2020 with cellulitis.  Pharmacy has been consulted for Cefepime and Vancomycin dosing.   Plan: - Scr 1.11. Will adjust Vancomycin from 1250 mg IV Q48  To 1500 mg q48h hrs. Goal AUC 400-550. Expected AUC: 526 SCr used: 1.11 Expected Cmin: 11.4  -continue Cefepime 2g IV q12h    Height: 5' (152.4 cm) Weight: 109.3 kg (241 lb) IBW/kg (Calculated) : 45.5  Temp (24hrs), Avg:98.4 F (36.9 C), Min:97.6 F (36.4 C), Max:98.9 F (37.2 C)  Recent Labs  Lab 12/01/20 1352 12/02/20 0527 12/03/20 0821  WBC 9.0 8.5  --   CREATININE 1.17* 1.15* 1.11*    Estimated Creatinine Clearance: 40.8 mL/min (A) (by C-G formula based on SCr of 1.11 mg/dL (H)).    Allergies  Allergen Reactions  . Lisinopril     Intense itching per patient can not tolerate   . Amlodipine     dizziness  . Atorvastatin     Other reaction(s): Muscle Pain  . Colestid  [Colestipol Hcl]     Itching, insomnia  . Crestor  [Rosuvastatin Calcium]     Other reaction(s): Muscle Pain  . Doxycycline   . Fluvastatin Sodium     Other reaction(s): Muscle Pain  . Furosemide     Severe Cramping  . Losartan     itching  . Pravastatin Sodium     Numbness in legs  . Verapamil     Shortness of breath, swelling, palpations.   . Allopurinol Other (See Comments)    dizziness  . Penicillins Rash    Took for pneumonia when she was 82 and mader her mouth break out    Antimicrobials this admission: Cefepime 4/10 >>  Vancomycin 4/10 >>   Dose adjustments this admission: 4/12:  Vanc 1250mg  q48h to 1500mg  q48h  Microbiology results:  Thank you for allowing pharmacy to be a part of this patient's care.  Chinita Greenland PharmD Clinical Pharmacist 12/03/2020

## 2020-12-03 NOTE — Plan of Care (Signed)

## 2020-12-03 NOTE — Progress Notes (Signed)
Tried to put on TED hose again with [facility] LPN present.. Mrs Brosious refused again. Both Nurses present

## 2020-12-04 DIAGNOSIS — R7301 Impaired fasting glucose: Secondary | ICD-10-CM

## 2020-12-04 DIAGNOSIS — E039 Hypothyroidism, unspecified: Secondary | ICD-10-CM

## 2020-12-04 DIAGNOSIS — M660 Rupture of popliteal cyst: Secondary | ICD-10-CM

## 2020-12-04 DIAGNOSIS — N1832 Chronic kidney disease, stage 3b: Secondary | ICD-10-CM

## 2020-12-04 MED ORDER — SODIUM CHLORIDE 0.9% FLUSH
10.0000 mL | Freq: Two times a day (BID) | INTRAVENOUS | Status: DC
  Administered 2020-12-04 – 2020-12-05 (×2): 10 mL via INTRAVENOUS

## 2020-12-04 MED ORDER — CEFAZOLIN SODIUM-DEXTROSE 2-4 GM/100ML-% IV SOLN
2.0000 g | Freq: Two times a day (BID) | INTRAVENOUS | Status: DC
  Administered 2020-12-04: 2 g via INTRAVENOUS
  Filled 2020-12-04 (×4): qty 100

## 2020-12-04 MED ORDER — OXYCODONE HCL 5 MG PO TABS
5.0000 mg | ORAL_TABLET | Freq: Four times a day (QID) | ORAL | Status: DC | PRN
  Administered 2020-12-04: 5 mg via ORAL
  Filled 2020-12-04: qty 1

## 2020-12-04 NOTE — Plan of Care (Signed)
?  Problem: Clinical Measurements: ?Goal: Ability to avoid or minimize complications of infection will improve ?Outcome: Progressing ?  ?Problem: Skin Integrity: ?Goal: Skin integrity will improve ?Outcome: Progressing ?  ?

## 2020-12-04 NOTE — Progress Notes (Signed)
ORTHOPAEDICS: I reviewed the hospitalist's note from yesterday. I believe the popliteal may have ruptured, BUT THERE IS NOT SUFFICIENT EVIDENCE TO CONCLUDE THE POPLITEAL CYST WAS INFECTED. The erythema has been localized to the lower leg. It is reasonable to believe the patient developed a superficial cellulitis distally due to the degree of swelling and inflammatory response. The patient has been afebrile and the WBC has been WNL.  I agree with plans to continue antibiotics at least through today. Continue elevation and cold therapy. There may also be some benefit to using NSAIDs (eg. Celebrex) to help with the inflammatory process.  Fransico Sciandra P. Holley Bouche M.D.

## 2020-12-04 NOTE — Evaluation (Signed)
Physical Therapy Evaluation Patient Details Name: Kayla Chan MRN: [id number] DOB: [birth year] Today's Date: 12/04/2020   History of Present Illness  Pt is an 87 y.o. female with medical history significant for hypertension, obesity, CKD III, and constipation, presents to the emergency department for chief concerns of right lower extremity pain and swelling.  MD assessment includes: ruptured Baker's cyst with right lower extremity cellulitis, and impaired fasting glucose from recent steroids.    Clinical Impression  Pt was very pleasant and motivated to participate during the session and put forth good effort throughout the session. Pt stated her fall occurred when spouse was attempting to "lift" her to assist her up a step to enter a restaurant with pt falling causing her spouse to fall as well.  Pt also stated noticing decreased strength and stability over the last several months with pt now using a RW for amb when prior Jan of this year she was ambulatory without an AD.  Pt did not require physical assistance during the session with any functional tasks but did require extra effort with transfers and was only able to amb 30' before returning to sitting secondary to fatigue.  Pt is at risk for further functional decline and falls and will benefit from HHPT upon discharge to safely address deficits listed in patient problem list for decreased caregiver assistance and eventual return to PLOF.      Follow Up Recommendations Home health PT;Supervision for mobility/OOB    Equipment Recommendations  Rolling walker with 5" wheels (Pt has a RW but is in poor condition and no longer folds)    Recommendations for Other Services       Precautions / Restrictions Precautions Precautions: Fall Restrictions Weight Bearing Restrictions: No Other Position/Activity Restrictions: Keep RLE elevated      Mobility  Bed Mobility Overal bed mobility: Modified Independent             General bed  mobility comments: Min extra time and effort only    Transfers Overall transfer level: Needs assistance Equipment used: Rolling walker (2 wheeled) Transfers: Sit to/from Stand Sit to Stand: Min guard         General transfer comment: Good eccentric and concentric control and stability  Ambulation/Gait Ambulation/Gait assistance: Min guard Gait Distance (Feet): 30 Feet Assistive device: Rolling walker (2 wheeled) Gait Pattern/deviations: Step-through pattern;Decreased step length - right;Decreased step length - left Gait velocity: decreased   General Gait Details: Slow cadence but steady without LOB  Stairs            Wheelchair Mobility    Modified Rankin (Stroke Patients Only)       Balance Overall balance assessment: Needs assistance   Sitting balance-Leahy Scale: Normal     Standing balance support: Bilateral upper extremity supported;During functional activity Standing balance-Leahy Scale: Good Standing balance comment: Min lean on the RW for support                             Pertinent Vitals/Pain Pain Assessment: No/denies pain    Home Living Family/patient expects to be discharged to:: Private residence Living Arrangements: Spouse/significant other Available Help at Discharge: Family;Available 24 hours/day Type of Home: House Home Access: Stairs to enter Entrance Stairs-Rails: Left;Right;Can reach both Entrance Stairs-Number of Steps: 2 Home Layout: Two level;Able to live on main level with bedroom/bathroom Home Equipment: Gilford Rile - 2 wheels;Shower seat      Prior Function Level of Independence: Independent with assistive  device(s)         Comments: Mod Ind amb limited community distances with a RW, Ind with ADLs, current fall occured attempting to step up a step into a restaurant with husband attempting to "lift me up the step" and then she fell causing him to fall as well, no other fall history     Hand Dominance         Extremity/Trunk Assessment   Upper Extremity Assessment Upper Extremity Assessment: Overall WFL for tasks assessed    Lower Extremity Assessment Lower Extremity Assessment: Generalized weakness       Communication   Communication: HOH  Cognition Arousal/Alertness: Awake/alert Behavior During Therapy: WFL for tasks assessed/performed Overall Cognitive Status: Within Functional Limits for tasks assessed                                        General Comments      Exercises Total Joint Exercises Ankle Circles/Pumps: AROM;Strengthening;Both;5 reps;10 reps Quad Sets: 10 reps;Strengthening;5 reps;Both Gluteal Sets: 10 reps;Both;Strengthening Hip ABduction/ADduction: AROM;Strengthening;Both;5 reps Long Arc Quad: AROM;Strengthening;Both;5 reps;10 reps Knee Flexion: 10 reps;5 reps;Both;Strengthening;AROM Other Exercises Other Exercises: HEP education for BLE APs, QS, GS, and LAQs x 10 each 5-6x/day   Assessment/Plan    PT Assessment Patient needs continued PT services  PT Problem List Decreased strength;Decreased activity tolerance;Decreased balance;Decreased mobility;Decreased knowledge of use of DME       PT Treatment Interventions DME instruction;Gait training;Stair training;Functional mobility training;Therapeutic activities;Therapeutic exercise;Balance training;Patient/family education    PT Goals (Current goals can be found in the Care Plan section)  Acute Rehab PT Goals Patient Stated Goal: To get stronger PT Goal Formulation: With patient Time For Goal Achievement: 12/17/20 Potential to Achieve Goals: Good    Frequency Min 2X/week   Barriers to discharge        Co-evaluation               AM-PAC PT "6 Clicks" Mobility  Outcome Measure Help needed turning from your back to your side while in a flat bed without using bedrails?: None Help needed moving from lying on your back to sitting on the side of a flat bed without using bedrails?:  None Help needed moving to and from a bed to a chair (including a wheelchair)?: A Little Help needed standing up from a chair using your arms (e.g., wheelchair or bedside chair)?: A Little Help needed to walk in hospital room?: A Little Help needed climbing 3-5 steps with a railing? : A Little 6 Click Score: 20    End of Session Equipment Utilized During Treatment: Gait belt Activity Tolerance: Patient tolerated treatment well Patient left: in bed;with call bell/phone within reach;with bed alarm set (Pt declined up in chair) Nurse Communication: Mobility status PT Visit Diagnosis: History of falling (Z91.81);Difficulty in walking, not elsewhere classified (R26.2);Muscle weakness (generalized) (M62.81)    Time: 4580-9983 PT Time Calculation (min) (ACUTE ONLY): 28 min   Charges:   PT Evaluation $PT Eval Moderate Complexity: 1 Mod PT Treatments $Therapeutic Exercise: 8-22 mins        D. Royetta Asal PT, DPT 12/04/20, 3:29 PM

## 2020-12-04 NOTE — Progress Notes (Signed)
Patient ID: Kayla Chan, female   DOB: 12-05-1931, 87 y.o.   MRN: [id number] [facility] PROGRESS NOTE  Kayla Chan ZDG:[id number] DOB: 11-Sep-1931 DOA: 12/01/2020 PCP: Birdie Sons, MD  HPI/Subjective: Patient states that she had a real restless night last night felt a little panicky.  Having some pains in her left leg also.  She has been having pain in her right leg going down to her foot.  On antibiotics for suspected cellulitis  Objective: Vitals:   12/04/20 0520 12/04/20 0821  BP:  (!) 156/70  Pulse:  72  Resp:  19  Temp: 97.6 F (36.4 C) 98.3 F (36.8 C)  SpO2:  95%    Intake/Output Summary (Last 24 hours) at 12/04/2020 1339 Last data filed at 12/04/2020 1035 Gross per 24 hour  Intake 580 ml  Output --  Net 580 ml   Filed Weights   12/01/20 1852  Weight: 109.3 kg    ROS: Review of Systems  Respiratory: Negative for cough and shortness of breath.   Cardiovascular: Negative for chest pain.  Gastrointestinal: Negative for abdominal pain, nausea and vomiting.  Musculoskeletal: Positive for joint pain.   Exam: Physical Exam HENT:     Head: Normocephalic.     Mouth/Throat:     Pharynx: No oropharyngeal exudate.  Eyes:     General: Lids are normal.     Conjunctiva/sclera: Conjunctivae normal.  Cardiovascular:     Rate and Rhythm: Normal rate and regular rhythm.     Heart sounds: Normal heart sounds, S1 normal and S2 normal.  Pulmonary:     Breath sounds: Normal breath sounds. No decreased breath sounds, wheezing, rhonchi or rales.  Abdominal:     Palpations: Abdomen is soft.     Tenderness: There is no abdominal tenderness.  Musculoskeletal:     Right lower leg: Swelling present.     Right ankle: Swelling present.     Left ankle: Swelling present.  Skin:    General: Skin is warm.     Findings: Bruising present.     Comments: Lots of bruising right leg going down into the Achilles area from the calf.  Some warmth to palpation and some redness on  the front of the calf.  Neurological:     Mental Status: She is alert and oriented to person, place, and time.     Comments: Patient able to straight leg raise.       Data Reviewed: Basic Metabolic Panel: Recent Labs  Lab 12/01/20 1352 12/02/20 0527 12/03/20 0821  NA 139 139  --   K 4.4 4.0  --   CL 108 110  --   CO2 25 22  --   GLUCOSE 155* 116*  --   BUN 25* 29*  --   CREATININE 1.17* 1.15* 1.11*  CALCIUM 8.8* 8.4*  --    Liver Function Tests: Recent Labs  Lab 12/01/20 1352 12/02/20 0527  AST 33 25  ALT 16 20  ALKPHOS 99 98  BILITOT 1.3* 0.8  PROT 7.1 6.0*  ALBUMIN 3.5 2.9*   CBC: Recent Labs  Lab 12/01/20 1352 12/02/20 0527  WBC 9.0 8.5  NEUTROABS 6.4  --   HGB 13.1 11.7*  HCT 40.6 36.2  MCV 96.2 95.5  PLT 338 319   Cardiac Enzymes: Recent Labs  Lab 12/01/20 1733  CKTOTAL 34*    CBG: Recent Labs  Lab 12/02/20 0054 12/02/20 0809  GLUCAP 121* 117*    Recent Results (from the past 240 hour(s))  Resp Panel by RT-PCR (Flu A&B, Covid) Nasopharyngeal Swab     Status: None   Collection Time: 12/01/20  3:26 PM   Specimen: Nasopharyngeal Swab; Nasopharyngeal(NP) swabs in vial transport medium  Result Value Ref Range Status   SARS Coronavirus 2 by RT PCR NEGATIVE NEGATIVE Final    Comment: (NOTE) SARS-CoV-2 target nucleic acids are NOT DETECTED.  The SARS-CoV-2 RNA is generally detectable in upper respiratory specimens during the acute phase of infection. The lowest concentration of SARS-CoV-2 viral copies this assay can detect is 138 copies/mL. A negative result does not preclude SARS-Cov-2 infection and should not be used as the sole basis for treatment or other patient management decisions. A negative result may occur with  improper specimen collection/handling, submission of specimen other than nasopharyngeal swab, presence of viral mutation(s) within the areas targeted by this assay, and inadequate number of viral copies(<138 copies/mL).  A negative result must be combined with clinical observations, patient history, and epidemiological information. The expected result is Negative.  Fact Sheet for Patients:  EntrepreneurPulse.com.au  Fact Sheet for Healthcare Providers:  IncredibleEmployment.be  This test is no t yet approved or cleared by the [place] FDA and  has been authorized for detection and/or diagnosis of SARS-CoV-2 by FDA under an Emergency Use Authorization (EUA). This EUA will remain  in effect (meaning this test can be used) for the duration of the COVID-19 declaration under Section 564(b)(1) of the Act, 21 U.S.C.section 360bbb-3(b)(1), unless the authorization is terminated  or revoked sooner.       Influenza A by PCR NEGATIVE NEGATIVE Final   Influenza B by PCR NEGATIVE NEGATIVE Final    Comment: (NOTE) The Xpert Xpress SARS-CoV-2/FLU/RSV plus assay is intended as an aid in the diagnosis of influenza from Nasopharyngeal swab specimens and should not be used as a sole basis for treatment. Nasal washings and aspirates are unacceptable for Xpert Xpress SARS-CoV-2/FLU/RSV testing.  Fact Sheet for Patients: EntrepreneurPulse.com.au  Fact Sheet for Healthcare Providers: IncredibleEmployment.be  This test is not yet approved or cleared by the [place] FDA and has been authorized for detection and/or diagnosis of SARS-CoV-2 by FDA under an Emergency Use Authorization (EUA). This EUA will remain in effect (meaning this test can be used) for the duration of the COVID-19 declaration under Section 564(b)(1) of the Act, 21 U.S.C. section 360bbb-3(b)(1), unless the authorization is terminated or revoked.  Performed at [facility], [address] [place], [place] [address]   MRSA PCR Screening     Status: None   Collection Time: 12/03/20 12:13 PM   Specimen: Nasal Mucosa; Nasopharyngeal  Result Value Ref Range  Status   MRSA by PCR NEGATIVE NEGATIVE Final    Comment:        The GeneXpert MRSA Assay (FDA approved for NASAL specimens only), is one component of a comprehensive MRSA colonization surveillance program. It is not intended to diagnose MRSA infection nor to guide or monitor treatment for MRSA infections. Performed at [facility], [address] [place], [place] [address]       Scheduled Meds: . aliskiren  150 mg Oral Daily  . enoxaparin (LOVENOX) injection  0.5 mg/kg Subcutaneous Q24H  . ezetimibe  10 mg Oral Daily  . levothyroxine  75 mcg Oral Q0600   Continuous Infusions: . sodium chloride Stopped (12/03/20 0158)  .  ceFAZolin (ANCEF) IV      Assessment/Plan:  1. Ruptured Baker's cyst with right lower extremity cellulitis.  Change antibiotics to Ancef.  Pain control.  Leg elevation.  Reassess tomorrow.  Physical therapy evaluation. 2. Hypothyroidism unspecified on Synthroid 3. Essential hypertension on aliskiren 4. Impaired fasting glucose from recent steroids 5. Chronic kidney disease stage IIIb 6. Morbid obesity with a BMI of 47.07     Code Status:     Code Status Orders  (From admission, onward)         Start     Ordered   12/01/20 1555  Full code  Continuous        12/01/20 1558        Code Status History    This patient has a current code status but no historical code status.   Advance Care Planning Activity     Family Communication: Spoke with husband on the phone Disposition Plan: Status is: Inpatient   Dispo: The patient is from: Home              Anticipated d/c is to: Home, earliest 12/05/2020              Patient currently on IV antibiotics for cellulitis.   Difficult to place patient.  No.  Consultants:  Orthopedic surgery  Antibiotics:  Change antibiotics to Ancef  Time spent: 28 minutes  [place]

## 2020-12-04 NOTE — Plan of Care (Signed)

## 2020-12-05 DIAGNOSIS — T148XXA Other injury of unspecified body region, initial encounter: Secondary | ICD-10-CM

## 2020-12-05 MED ORDER — ALLOPURINOL 100 MG PO TABS: 50.0000 mg | ORAL_TABLET | Freq: Every day | ORAL | Status: DC

## 2020-12-05 MED ORDER — FUROSEMIDE 20 MG PO TABS: 10.0000 mg | ORAL_TABLET | Freq: Every day | ORAL | Status: DC | PRN

## 2020-12-05 MED ORDER — ACETAMINOPHEN 325 MG PO TABS: 650.0000 mg | ORAL_TABLET | Freq: Four times a day (QID) | ORAL | Status: DC | PRN

## 2020-12-05 MED ORDER — CEPHALEXIN 500 MG PO CAPS: 500.0000 mg | ORAL_CAPSULE | Freq: Three times a day (TID) | ORAL | 0 refills | Status: AC

## 2020-12-05 NOTE — Progress Notes (Signed)
DC order noted.  Reviewed discharge instructions with patient and spouse including follow up appointment with provider, medication, prescriptions, activityand when to notify physician.  Questions answered and understanding verbalized.  Copy given.  Patient discharged off unit at this time stable without complaint.

## 2020-12-05 NOTE — Discharge Instructions (Signed)
Elevated leg while resting in bed Normal activity Can ice for 20 minutes every four to six hours

## 2020-12-05 NOTE — Discharge Summary (Signed)
[place] [facility] [place] NAME: Kayla Chan    MR#:  [id number]  DATE OF BIRTH:  [birth year]  DATE OF ADMISSION:  12/01/2020 ADMITTING PHYSICIAN: Sidney Ace, MD  DATE OF DISCHARGE: 12/05/2020 10:58 AM  PRIMARY CARE PHYSICIAN: Birdie Sons, MD    ADMISSION DIAGNOSIS:  Cellulitis and abscess of foot [L03.119, L02.619] Cellulitis of right lower extremity [L03.115] Lower extremity cellulitis [L03.119]  DISCHARGE DIAGNOSIS:  Ruptured Baker's cyst Right lower extremity cellulitis Bruising right lower extremity Hypothyroidism unspecified Essential hypertension Impaired fasting glucose Chronic kidney disease stage IIIb Morbid obesity with a BMI 47.07  SECONDARY DIAGNOSIS:   Past Medical History:  Diagnosis Date  . Cancer ([place])    BCC on nose  . Hyperlipidemia   . Hypertension     HOSPITAL COURSE:   1.  Right lower extremity pain.  Patient had leg a lot of bruising of her right lower extremity.  She did have a recent fall but the bruising was after the fall.  The patient was started on antibiotics for right leg cellulitis.  The patient also had a ruptured Baker's cyst which could contribute to the bruising.  The bruising will likely take 6 to 8 weeks to reabsorb.  Ultrasound the lower extremity was negative for DVT on 12/01/2020 but did show 6.1 cm fluid collection extension into the right medial calf.  Findings concerning for Baker's cyst with rupture.  Patient did well enough with physical therapy to go home with home health.  Continue to elevate leg while in bed or resting.  Can ice leg every 4-6 hours for 20 minutes.  Followed up with orthopedic as outpatient.  Patient was initially on antibiotics vancomycin and cefepime.  I switch the patient over to Ancef.  Upon discharge we will give Keflex for another 5 days. 2.  Hypothyroidism unspecified on Synthroid 3.  Essential hypertension on aliskiren.  Patient states that she gets dizzy  sometimes with this medication.  She can follow-up with Dr. Caryn Section for this. 4.  Impaired fasting glucose secondary to recent steroids. 5.  Morbid obesity with a BMI of 47.07 6.  Chronic kidney disease stage IIIa   DISCHARGE CONDITIONS:   Satisfactory  CONSULTS OBTAINED:  Treatment Team:  Dereck Leep, MD  DRUG ALLERGIES:   Allergies  Allergen Reactions  . Lisinopril     Intense itching per patient can not tolerate   . Amlodipine     dizziness  . Atorvastatin     Other reaction(s): Muscle Pain  . Colestid  [Colestipol Hcl]     Itching, insomnia  . Crestor  [Rosuvastatin Calcium]     Other reaction(s): Muscle Pain  . Doxycycline   . Fluvastatin Sodium     Other reaction(s): Muscle Pain  . Furosemide     Severe Cramping  . Losartan     itching  . Pravastatin Sodium     Numbness in legs  . Verapamil     Shortness of breath, swelling, palpations.   . Allopurinol Other (See Comments)    dizziness  . Penicillins Rash    Took for pneumonia when she was 88 and mader her mouth break out    DISCHARGE MEDICATIONS:   Allergies as of 12/05/2020      Reactions   Lisinopril    Intense itching per patient can not tolerate    Amlodipine    dizziness   Atorvastatin    Other reaction(s): Muscle Pain   Colestid  [  colestipol Hcl]    Itching, insomnia   Crestor  [rosuvastatin Calcium]    Other reaction(s): Muscle Pain   Doxycycline    Fluvastatin Sodium    Other reaction(s): Muscle Pain   Furosemide    Severe Cramping   Losartan    itching   Pravastatin Sodium    Numbness in legs   Verapamil    Shortness of breath, swelling, palpations.    Allopurinol Other (See Comments)   dizziness   Penicillins Rash   Took for pneumonia when she was 23 and mader her mouth break out      Medication List    STOP taking these medications   ciprofloxacin 500 MG tablet Commonly known as: Cipro   hydrOXYzine 10 MG tablet Commonly known as: ATARAX/VISTARIL   naproxen 500  MG tablet Commonly known as: Naprosyn   sulfacetamide 10 % ophthalmic solution Commonly known as: Bleph-10     TAKE these medications   acetaminophen 325 MG tablet Commonly known as: TYLENOL Take 2 tablets (650 mg total) by mouth every 6 (six) hours as needed for mild pain (or Fever >/= 101).   aliskiren 150 MG tablet Commonly known as: TEKTURNA Take 1 tablet (150 mg total) by mouth daily.   allopurinol 100 MG tablet Commonly known as: ZYLOPRIM Take 0.5 tablets (50 mg total) by mouth daily.   aspirin 81 MG tablet Take 81 mg by mouth daily.   cephALEXin 500 MG capsule Commonly known as: KEFLEX Take 1 capsule (500 mg total) by mouth 3 (three) times daily for 5 days.   colchicine 0.6 MG tablet Take 1 tablet (0.6 mg total) by mouth daily as needed (gout).   diclofenac Sodium 1 % Gel Commonly known as: Voltaren Apply 2 g topically 4 (four) times daily. What changed:   when to take this  reasons to take this   ezetimibe 10 MG tablet Commonly known as: ZETIA TAKE 1 TABLET BY MOUTH EVERY DAY IN THE MORNING What changed: See the new instructions.   furosemide 20 MG tablet Commonly known as: LASIX Take 0.5 tablets (10 mg total) by mouth daily as needed for fluid or edema.   levothyroxine 75 MCG tablet Commonly known as: SYNTHROID TAKE 1 TABLET BY MOUTH EVERY DAY What changed: when to take this   mometasone 0.1 % ointment Commonly known as: ELOCON APPLY 1 APPLICATION DAILY AS NEEDED TOPICALLY. APPLY TO AFFECTED AREA What changed:   reasons to take this  additional instructions   polyvinyl alcohol 1.4 % ophthalmic solution Commonly known as: LIQUIFILM TEARS Place 1 drop into both eyes as needed for dry eyes.   tacrolimus 0.1 % ointment Commonly known as: PROTOPIC Apply 1 application topically 2 (two) times daily as needed (face itching).            Durable Medical Equipment  (From admission, onward)         Start     Ordered   12/05/20 0854  For  home use only DME Walker rolling  Once       Question Answer Comment  Walker: With 5 Inch Wheels   Patient needs a walker to treat with the following condition Weakness      12/05/20 0854           DISCHARGE INSTRUCTIONS:  Follow-up PMD 5 days Follow-up orthopedic surgery 2 weeks  If you experience worsening of your admission symptoms, develop shortness of breath, life threatening emergency, suicidal or homicidal thoughts you must seek medical attention  immediately by calling 911 or calling your MD immediately  if symptoms less severe.  You Must read complete instructions/literature along with all the possible adverse reactions/side effects for all the Medicines you take and that have been prescribed to you. Take any new Medicines after you have completely understood and accept all the possible adverse reactions/side effects.   Please note  You were cared for by a hospitalist during your hospital stay. If you have any questions about your discharge medications or the care you received while you were in the hospital after you are discharged, you can call the unit and asked to speak with the hospitalist on call if the hospitalist that took care of you is not available. Once you are discharged, your primary care physician will handle any further medical issues. Please note that NO REFILLS for any discharge medications will be authorized once you are discharged, as it is imperative that you return to your primary care physician (or establish a relationship with a primary care physician if you do not have one) for your aftercare needs so that they can reassess your need for medications and monitor your lab values.    Today   CHIEF COMPLAINT:   Chief Complaint  Patient presents with  . Fall  . Leg Pain    HISTORY OF PRESENT ILLNESS:  Kayla Chan  is a 87 y.o. female female with right leg pain   VITAL SIGNS:  Blood pressure 130/61, pulse 67, temperature 97.7 F (36.5 C), resp.  rate 16, height 5' (1.524 m), weight 109.3 kg, SpO2 96 %.  I/O:    Intake/Output Summary (Last 24 hours) at 12/05/2020 1638 Last data filed at 12/05/2020 0300 Gross per 24 hour  Intake 440 ml  Output --  Net 440 ml     PHYSICAL EXAMINATION:  GENERAL:  87 y.o.-year-old patient lying in the bed with no acute distress.  EYES: Pupils equal, round, reactive to light and accommodation. No scleral icterus. HEENT: Head atraumatic, normocephalic. Oropharynx and nasopharynx clear.  LUNGS: Normal breath sounds bilaterally, no wheezing, rales,rhonchi or crepitation. No use of accessory muscles of respiration.  CARDIOVASCULAR: S1, S2 normal. No murmurs, rubs, or gallops.  ABDOMEN: Soft, non-tender, non-distended.  EXTREMITIES: No pedal edema.  NEUROLOGIC: Cranial nerves II through XII are intact. Muscle strength 5/5 in all extremities. Sensation intact. Gait not checked.  PSYCHIATRIC: The patient is alert and oriented x 3.  SKIN: Extensive bruising right lower extremity especially posterior.  Slight erythema front of the shin with slight warmth.  DATA REVIEW:   CBC Recent Labs  Lab 12/02/20 0527  WBC 8.5  HGB 11.7*  HCT 36.2  PLT 319    Chemistries  Recent Labs  Lab 12/02/20 0527 12/03/20 0821  NA 139  --   K 4.0  --   CL 110  --   CO2 22  --   GLUCOSE 116*  --   BUN 29*  --   CREATININE 1.15* 1.11*  CALCIUM 8.4*  --   AST 25  --   ALT 20  --   ALKPHOS 98  --   BILITOT 0.8  --      Microbiology Results  Results for orders placed or performed during the hospital encounter of 12/01/20  Resp Panel by RT-PCR (Flu A&B, Covid) Nasopharyngeal Swab     Status: None   Collection Time: 12/01/20  3:26 PM   Specimen: Nasopharyngeal Swab; Nasopharyngeal(NP) swabs in vial transport medium  Result Value Ref Range Status  SARS Coronavirus 2 by RT PCR NEGATIVE NEGATIVE Final    Comment: (NOTE) SARS-CoV-2 target nucleic acids are NOT DETECTED.  The SARS-CoV-2 RNA is generally  detectable in upper respiratory specimens during the acute phase of infection. The lowest concentration of SARS-CoV-2 viral copies this assay can detect is 138 copies/mL. A negative result does not preclude SARS-Cov-2 infection and should not be used as the sole basis for treatment or other patient management decisions. A negative result may occur with  improper specimen collection/handling, submission of specimen other than nasopharyngeal swab, presence of viral mutation(s) within the areas targeted by this assay, and inadequate number of viral copies(<138 copies/mL). A negative result must be combined with clinical observations, patient history, and epidemiological information. The expected result is Negative.  Fact Sheet for Patients:  EntrepreneurPulse.com.au  Fact Sheet for Healthcare Providers:  IncredibleEmployment.be  This test is no t yet approved or cleared by the [place] FDA and  has been authorized for detection and/or diagnosis of SARS-CoV-2 by FDA under an Emergency Use Authorization (EUA). This EUA will remain  in effect (meaning this test can be used) for the duration of the COVID-19 declaration under Section 564(b)(1) of the Act, 21 U.S.C.section 360bbb-3(b)(1), unless the authorization is terminated  or revoked sooner.       Influenza A by PCR NEGATIVE NEGATIVE Final   Influenza B by PCR NEGATIVE NEGATIVE Final    Comment: (NOTE) The Xpert Xpress SARS-CoV-2/FLU/RSV plus assay is intended as an aid in the diagnosis of influenza from Nasopharyngeal swab specimens and should not be used as a sole basis for treatment. Nasal washings and aspirates are unacceptable for Xpert Xpress SARS-CoV-2/FLU/RSV testing.  Fact Sheet for Patients: EntrepreneurPulse.com.au  Fact Sheet for Healthcare Providers: IncredibleEmployment.be  This test is not yet approved or cleared by the [place] FDA  and has been authorized for detection and/or diagnosis of SARS-CoV-2 by FDA under an Emergency Use Authorization (EUA). This EUA will remain in effect (meaning this test can be used) for the duration of the COVID-19 declaration under Section 564(b)(1) of the Act, 21 U.S.C. section 360bbb-3(b)(1), unless the authorization is terminated or revoked.  Performed at [facility], [address] [place], [place] [address]   MRSA PCR Screening     Status: None   Collection Time: 12/03/20 12:13 PM   Specimen: Nasal Mucosa; Nasopharyngeal  Result Value Ref Range Status   MRSA by PCR NEGATIVE NEGATIVE Final    Comment:        The GeneXpert MRSA Assay (FDA approved for NASAL specimens only), is one component of a comprehensive MRSA colonization surveillance program. It is not intended to diagnose MRSA infection nor to guide or monitor treatment for MRSA infections. Performed at [facility], [address], [place], [place] [address]       Management plans discussed with the patient, family and they are in agreement.  CODE STATUS:     Code Status Orders  (From admission, onward)         Start     Ordered   12/01/20 1555  Full code  Continuous        12/01/20 1558        Code Status History    This patient has a current code status but no historical code status.   Advance Care Planning Activity      TOTAL TIME TAKING CARE OF THIS PATIENT: 35 minutes.    Loletha Grayer M.D on 12/05/2020 at 4:38 PM  Between 7am to 6pm - Pager - [phone]  After 6pm go to [email or web address] - password [email or web address]  [facility]  CC: Primary care physician; Birdie Sons, MD

## 2020-12-05 NOTE — Care Management Important Message (Signed)
Important Message  Patient Details  Name: Kayla Chan MRN: [id number] Date of Birth: 15-May-1932   Medicare Important Message Given:  N/A - LOS <3 / Initial given by admissions     [name] 12/05/2020, 9:37 AM

## 2020-12-05 NOTE — TOC Progression Note (Addendum)
Transition of Care [facility]) - Progression Note    Patient Details  Name: Kayla Chan MRN: [id number] Date of Birth: 24-Jun-1932  Transition of [facility]) CM/SW Contact  Su Hilt, RN Phone Number: 12/05/2020, 9:37 AM  Clinical Narrative:    Corene Cornea with [place] health called and they are unable to accept the patient, I called [place] with [facility] and requested that they accept the patient for [facility] PT, she will let me know, I contacted [place] with [facility] and requested to see if they can accept the patient for [facility] PT,    Expected Discharge Plan: (P) [place] Barriers to Discharge: (P) Barriers Resolved  Expected Discharge Plan and Services Expected Discharge Plan: (P) [place]   Discharge Planning Services: (P) CM Consult   Living arrangements for the past 2 months: (P) Single Family Home Expected Discharge Date: 12/05/20               DME Arranged: (P) [place] Rile rolling,3-N-1 DME Agency: (P) [employer] Date DME Agency Contacted: (P) 12/05/20 Time DME Agency Contacted: (P) [phone] Representative spoke with at DME Agency: (P) Suanne Marker HH Arranged: (P) PT [place]: (P) [place] ([place]) Date [place]: (P) 12/05/20 Time [place]: (P) [phone] Representative spoke with at [place]: (P) [place] Determinants of Health ([place]) Interventions    Readmission Risk Interventions No flowsheet data found.

## 2020-12-05 NOTE — Plan of Care (Signed)
  Problem: Clinical Measurements: Goal: Ability to avoid or minimize complications of infection will improve Outcome: Adequate for Discharge   Problem: Skin Integrity: Goal: Skin integrity will improve Outcome: Adequate for Discharge   Problem: Education: Goal: Knowledge of General Education information will improve Description: Including pain rating scale, medication(s)/side effects and non-pharmacologic comfort measures Outcome: Adequate for Discharge   Problem: Health Behavior/Discharge Planning: Goal: Ability to manage health-related needs will improve Outcome: Adequate for Discharge   Problem: Clinical Measurements: Goal: Ability to maintain clinical measurements within normal limits will improve Outcome: Adequate for Discharge Goal: Will remain free from infection Outcome: Adequate for Discharge Goal: Diagnostic test results will improve Outcome: Adequate for Discharge Goal: Respiratory complications will improve Outcome: Adequate for Discharge Goal: Cardiovascular complication will be avoided Outcome: Adequate for Discharge   Problem: Activity: Goal: Risk for activity intolerance will decrease Outcome: Adequate for Discharge   Problem: Nutrition: Goal: Adequate nutrition will be maintained Outcome: Adequate for Discharge   Problem: Coping: Goal: Level of anxiety will decrease Outcome: Adequate for Discharge   Problem: Elimination: Goal: Will not experience complications related to bowel motility Outcome: Adequate for Discharge Goal: Will not experience complications related to urinary retention Outcome: Adequate for Discharge   Problem: Pain Managment: Goal: General experience of comfort will improve Outcome: Adequate for Discharge   Problem: Safety: Goal: Ability to remain free from injury will improve Outcome: Adequate for Discharge   Problem: Skin Integrity: Goal: Risk for impaired skin integrity will decrease Outcome: Adequate for Discharge   

## 2020-12-09 ENCOUNTER — Telehealth: Payer: Self-pay

## 2020-12-09 NOTE — Telephone Encounter (Signed)
Can double book the 11:20 on Wednesday.

## 2020-12-09 NOTE — Telephone Encounter (Signed)
Apt scheduled Mr. Hallstrom advised.   Thanks,   -Mickel Baas

## 2020-12-09 NOTE — Telephone Encounter (Signed)
Copied from [place] [id number] [phone]. Topic: Appointment Scheduling - Scheduling Inquiry for Clinic >> Dec 09, 2020  3:08 PM Yvette Rack wrote: Reason for CRM: Pt husband stated pt was discharged from [facility] on 12-05-20 and told to contact pcp for an appt within 5 days. Attempted to schedule pt for hospital fu appt but pt husband declined and asked that a message be sent to Dr. Caryn Section to call them because they were told pt should be seen in 5 days of discharge.

## 2020-12-11 ENCOUNTER — Encounter: Payer: Self-pay | Admitting: Family Medicine

## 2020-12-11 ENCOUNTER — Ambulatory Visit (INDEPENDENT_AMBULATORY_CARE_PROVIDER_SITE_OTHER): Payer: PPO | Admitting: Family Medicine

## 2020-12-11 ENCOUNTER — Other Ambulatory Visit: Payer: Self-pay

## 2020-12-11 VITALS — BP 138/79 | HR 94 | Temp 97.2°F | Resp 20 | Wt 238.4 lb

## 2020-12-11 DIAGNOSIS — L03115 Cellulitis of right lower limb: Secondary | ICD-10-CM

## 2020-12-11 DIAGNOSIS — M7989 Other specified soft tissue disorders: Secondary | ICD-10-CM | POA: Diagnosis not present

## 2020-12-11 DIAGNOSIS — S8011XA Contusion of right lower leg, initial encounter: Secondary | ICD-10-CM

## 2020-12-11 DIAGNOSIS — M7121 Synovial cyst of popliteal space [Baker], right knee: Secondary | ICD-10-CM

## 2020-12-11 MED ORDER — SULFAMETHOXAZOLE-TRIMETHOPRIM 800-160 MG PO TABS: 1.0000 | ORAL_TABLET | Freq: Two times a day (BID) | ORAL | 0 refills | Status: AC

## 2020-12-11 MED ORDER — SULFAMETHOXAZOLE-TRIMETHOPRIM 800-160 MG PO TABS: 1.0000 | ORAL_TABLET | Freq: Two times a day (BID) | ORAL | 0 refills | Status: DC

## 2020-12-11 NOTE — Progress Notes (Signed)
Established patient visit   Patient: Kayla Chan   DOB: 07-Sep-1931   87 y.o. Female  MRN: [id number] Visit Date: 12/11/2020  Today's healthcare provider: Lelon Huh, MD   Chief Complaint  Patient presents with  . Hospitalization Follow-up   Subjective    HPI  Follow up Hospitalization  Patient was admitted to [facility] on 12/01/2020 and discharged on 12/05/2020. She was treated for cellulitis and abscess of right lower extremity, edema, ruptured bakers cyst and drug induced gout. Treatment for this included antibiotics vancomycin and cefepime.  She was later switched over to Ancef.  Upon discharge she was given Keflex for another 5 days.. Was seen by Dr. Marry Guan for baker's cyst and is supposed to have follow up 2 weeks after discharge.  Telephone follow up was not done. She reports good compliance with treatment. She reports this condition is improved. Still has some redness burning pain, swelling and heat of the right lower leg. Patient took the last dose of Cephalexin last night. She is still having quite a bit of swelling in the right lower leg, but hasn't been taking the furosemide.   -----------------------------------------------------------------------------------------       Medications: Outpatient Medications Prior to Visit  Medication Sig  . acetaminophen (TYLENOL) 325 MG tablet Take 2 tablets (650 mg total) by mouth every 6 (six) hours as needed for mild pain (or Fever >/= 101).  Marland Kitchen aliskiren (TEKTURNA) 150 MG tablet Take 1 tablet (150 mg total) by mouth daily.  Marland Kitchen allopurinol (ZYLOPRIM) 100 MG tablet Take 0.5 tablets (50 mg total) by mouth daily.  Marland Kitchen aspirin 81 MG tablet Take 81 mg by mouth daily.  . colchicine 0.6 MG tablet Take 1 tablet (0.6 mg total) by mouth daily as needed (gout).  Marland Kitchen diclofenac Sodium (VOLTAREN) 1 % GEL Apply 2 g topically 4 (four) times daily. (Patient taking differently: Apply 2 g topically 4 (four) times daily as needed (pain).)  .  ezetimibe (ZETIA) 10 MG tablet TAKE 1 TABLET BY MOUTH EVERY DAY IN THE MORNING (Patient taking differently: Take 10 mg by mouth daily.)  . furosemide (LASIX) 20 MG tablet Take 0.5 tablets (10 mg total) by mouth daily as needed for fluid or edema.  Marland Kitchen levothyroxine (SYNTHROID) 75 MCG tablet TAKE 1 TABLET BY MOUTH EVERY DAY (Patient taking differently: Take 75 mcg by mouth daily before breakfast.)  . mometasone (ELOCON) 0.1 % ointment APPLY 1 APPLICATION DAILY AS NEEDED TOPICALLY. APPLY TO AFFECTED AREA (Patient taking differently: Apply 1 application topically daily as needed (face itching).)  . polyvinyl alcohol (LIQUIFILM TEARS) 1.4 % ophthalmic solution Place 1 drop into both eyes as needed for dry eyes.  Marland Kitchen tacrolimus (PROTOPIC) 0.1 % ointment Apply 1 application topically 2 (two) times daily as needed (face itching).   No facility-administered medications prior to visit.    Review of Systems  Constitutional: Negative for appetite change, chills, fatigue and fever.  Respiratory: Negative for chest tightness and shortness of breath.   Cardiovascular: Positive for leg swelling. Negative for chest pain and palpitations.  Gastrointestinal: Negative for abdominal pain, nausea and vomiting.  Genitourinary:       Vaginal itching started 2 days ago  Musculoskeletal: Positive for myalgias (right lower leg).  Skin: Positive for color change.  Neurological: Positive for weakness. Negative for dizziness.       Objective    BP 138/79 (BP Location: Right Wrist, Patient Position: Sitting, Cuff Size: Normal)   Pulse 94   Temp Marland Kitchen)  97.2 F (36.2 C) (Temporal)   Resp 20   Wt 238 lb 6.4 oz (108.1 kg)   SpO2 99% Comment: room air  BMI 46.56 kg/m     Physical Exam   General appearance: Obese female, cooperative and in no acute distress Head: Normocephalic, without obvious abnormality, atraumatic Respiratory: Respirations even and unlabored, normal respiratory rate Extremities: 3+ swelling RLE, 2_  on left. Right anterior lower leg mildly inflamed and tender. Bruising discoloration has resolved. No calf tenderness. Skin: Skin color, texture, turgor normal. No rashes seen  Psych: Appropriate mood and affect. Neurologic: Mental status: Alert, oriented to person, place, and time, thought content appropriate.    Assessment & Plan     1. Contusion of right lower extremity, initial encounter Secondary to fall around the first of this month with complications below, but contusion has mostly resolved.   2. Synovial cyst of right popliteal space (Ruptured) Was seen by Dr. Marry Guan in the hospital and needs follow up arranged.  - Ambulatory referral to Orthopedic Surgery  3. Cellulitis of right lower extremity Improved, but not resolved. Will change cephalexin to - sulfamethoxazole-trimethoprim (BACTRIM DS) 800-160 MG tablet; Take 1 tablet by mouth 2 (two) times daily for 10 days.  Dispense: 20 tablet; Refill: 0   Recheck in 2 weeks.   4. Right leg swelling Secondary to trauma and infections. Need strict leg elevation when not walking. She does have some furosemide at home and may take one as needed if swelling makes her leg uncomfortable.   5. Hypertension Seems to be tolerating Tekturna better than other medications she has tried. Continue current medications.         The entirety of the information documented in the History of Present Illness, Review of Systems and Physical Exam were personally obtained by me. Portions of this information were initially documented by the CMA and reviewed by me for thoroughness and accuracy.      Lelon Huh, MD  [facility] [phone] (phone) [phone] (fax)  [place]

## 2020-12-12 DIAGNOSIS — E039 Hypothyroidism, unspecified: Secondary | ICD-10-CM | POA: Diagnosis not present

## 2020-12-12 DIAGNOSIS — L03115 Cellulitis of right lower limb: Secondary | ICD-10-CM | POA: Diagnosis not present

## 2020-12-12 DIAGNOSIS — M8580 Other specified disorders of bone density and structure, unspecified site: Secondary | ICD-10-CM | POA: Diagnosis not present

## 2020-12-12 DIAGNOSIS — K5900 Constipation, unspecified: Secondary | ICD-10-CM | POA: Diagnosis not present

## 2020-12-12 DIAGNOSIS — L02611 Cutaneous abscess of right foot: Secondary | ICD-10-CM | POA: Diagnosis not present

## 2020-12-12 DIAGNOSIS — Z6841 Body Mass Index (BMI) 40.0 and over, adult: Secondary | ICD-10-CM | POA: Diagnosis not present

## 2020-12-12 DIAGNOSIS — R739 Hyperglycemia, unspecified: Secondary | ICD-10-CM | POA: Diagnosis not present

## 2020-12-12 DIAGNOSIS — Z7982 Long term (current) use of aspirin: Secondary | ICD-10-CM | POA: Diagnosis not present

## 2020-12-12 DIAGNOSIS — M21372 Foot drop, left foot: Secondary | ICD-10-CM | POA: Diagnosis not present

## 2020-12-12 DIAGNOSIS — N1832 Chronic kidney disease, stage 3b: Secondary | ICD-10-CM | POA: Diagnosis not present

## 2020-12-12 DIAGNOSIS — Z9181 History of falling: Secondary | ICD-10-CM | POA: Diagnosis not present

## 2020-12-12 DIAGNOSIS — I129 Hypertensive chronic kidney disease with stage 1 through stage 4 chronic kidney disease, or unspecified chronic kidney disease: Secondary | ICD-10-CM | POA: Diagnosis not present

## 2020-12-12 DIAGNOSIS — E785 Hyperlipidemia, unspecified: Secondary | ICD-10-CM | POA: Diagnosis not present

## 2020-12-12 DIAGNOSIS — M660 Rupture of popliteal cyst: Secondary | ICD-10-CM | POA: Diagnosis not present

## 2020-12-12 DIAGNOSIS — Z85828 Personal history of other malignant neoplasm of skin: Secondary | ICD-10-CM | POA: Diagnosis not present

## 2020-12-16 ENCOUNTER — Telehealth: Payer: Self-pay

## 2020-12-16 NOTE — Telephone Encounter (Signed)
Left detailed message for [name] approving orders. KW

## 2020-12-16 NOTE — Telephone Encounter (Signed)
Please review. KW 

## 2020-12-16 NOTE — Telephone Encounter (Signed)
Copied from [place] [id number]. Topic: Quick Communication - Home Health Verbal Orders >> Dec 16, 2020 10:22 AM Tessa Lerner A wrote: Caller/Agency: Lanny Hurst  Callback Number: [phone]  Requesting OT/PT/Skilled Nursing/Social Work/Speech Therapy: PT  Frequency: 2w4 1w2

## 2020-12-16 NOTE — Telephone Encounter (Signed)
That's fine

## 2020-12-18 ENCOUNTER — Ambulatory Visit: Payer: Self-pay | Admitting: *Deleted

## 2020-12-18 NOTE — Telephone Encounter (Addendum)
Patient message sent twice- unaware of previous triage- patient/husband did not mention any breathing/swallowing difficulty. Patient experiencing vaginal and leg itchiness and mouth outbreak from sulfamethoxazole-trimethoprim (BACTRIM DS) 800-160 MG tablet. Seeking clinical advice   Take 1 tablet by mouth 2 (two) times daily for 10 days., Starting Wed 12/11/2020, Until Sat 12/21/2020, Normal   Dose, Route, Frequency: 1 tablet, Oral, 2 times daily  Start: 12/11/2020  End: 12/21/2020  Patient has been having itching in her vagina, legs and mouth. Has stopped the medication. Last dose of medication last night. Patient was using for treatment of cellulitis- some symptoms are still present (redness and swelling)- now has itching Reason for Disposition . [1] Caller has URGENT medicine question about med that PCP or specialist prescribed AND [2] triager unable to answer question  Answer Assessment - Initial Assessment Questions 1. NAME of MEDICATION: "What medicine are you calling about?"     Bactrim 2. QUESTION: "What is your question?" (e.g., medication refill, side effect)     SE with medictaion 3. PRESCRIBING HCP: "Who prescribed it?" Reason: if prescribed by specialist, call should be referred to that group.     Fisher 4. SYMPTOMS: "Do you have any symptoms?"     itching and peeling on legs, vaginal itching- 2 days, sores in mouth-2 days 5. SEVERITY: If symptoms are present, ask "Are they mild, moderate or severe?"     Medication does not seem to be helping the cellulitis- bruising and blackness gone- still red on side, swelling still present 6. PREGNANCY:  "Is there any chance that you are pregnant?" "When was your last menstrual period?"     n/a  Protocols used: MEDICATION QUESTION CALL-A-AH

## 2020-12-18 NOTE — Telephone Encounter (Signed)
Pt and her husband called stating she started taking Bactrim DS on 12/11/20; she is now having vaginal, mouth, and leg itching; she denies shortness of breath, but feels like she does not "swallow good"; she is not able to sleep due to itching; the pt says she started having problems 3-4 days after starting starting the medication; recommendations made per nurse triage protocol; the pt and her husband verbalize understanding but state she is not going to the ED; the pt is seen by Dr Caryn Section, [facility]; they were transferred to [facility] for final recommendations.  Reason for Disposition . Difficulty swallowing, drooling or slurred speech  Answer Assessment - Initial Assessment Questions 1. MAIN SYMPTOM: "What is your main symptom?" "How bad is it?"      Vaginal, oral, and leg itching 2. RESPIRATORY STATUS: "Are you having difficulty breathing?"  (e.g., yes/no, wheezing, unable to complete a sentence)     no 3. SWALLOWING: "Can you swallow?" (e.g., yes/no, food, fluid, saliva)      "can't swallow good" 4. VASCULAR STATUS: "Are you feeling weak?" If Yes, ask: "Can you stand and walk normally?"     no 5. ONSET: "When did the reaction start?" (Minutes or hours ago)      12/15/20 6. SUBSTANCE: "What are you reacting to?" "When did the contact occur?"      Bactrim DS 7. PREVIOUS REACTION: "Have you ever reacted to it before?" If Yes, ask: "What happened that time?"   no 8. EPINEPHRINE: "Do you have an epinephrine autoinjector (e.g., EpiPen)?"  Protocols used: ANAPHYLAXIS-A-AH

## 2020-12-19 ENCOUNTER — Other Ambulatory Visit: Payer: Self-pay | Admitting: Family Medicine

## 2020-12-19 DIAGNOSIS — I10 Essential (primary) hypertension: Secondary | ICD-10-CM

## 2020-12-19 MED ORDER — CEFDINIR 300 MG PO CAPS: 600.0000 mg | ORAL_CAPSULE | Freq: Every day | ORAL | 0 refills | Status: DC

## 2020-12-19 NOTE — Addendum Note (Signed)
Addended by: Birdie Sons on: 12/19/2020 09:42 AM   Modules accepted: Orders

## 2020-12-19 NOTE — Telephone Encounter (Signed)
Have sent prescription for cefdinir in place of septra DS

## 2020-12-20 NOTE — Telephone Encounter (Signed)
Patient advised. She states she picked up prescription yesterday and had already taken 2 doses if the medication. She reports good tolerance so far.

## 2020-12-24 DIAGNOSIS — M21372 Foot drop, left foot: Secondary | ICD-10-CM | POA: Diagnosis not present

## 2020-12-24 DIAGNOSIS — L02611 Cutaneous abscess of right foot: Secondary | ICD-10-CM | POA: Diagnosis not present

## 2020-12-24 DIAGNOSIS — Z9181 History of falling: Secondary | ICD-10-CM | POA: Diagnosis not present

## 2020-12-24 DIAGNOSIS — N1832 Chronic kidney disease, stage 3b: Secondary | ICD-10-CM | POA: Diagnosis not present

## 2020-12-24 DIAGNOSIS — M660 Rupture of popliteal cyst: Secondary | ICD-10-CM | POA: Diagnosis not present

## 2020-12-24 DIAGNOSIS — M8580 Other specified disorders of bone density and structure, unspecified site: Secondary | ICD-10-CM | POA: Diagnosis not present

## 2020-12-24 DIAGNOSIS — R739 Hyperglycemia, unspecified: Secondary | ICD-10-CM | POA: Diagnosis not present

## 2020-12-24 DIAGNOSIS — L03115 Cellulitis of right lower limb: Secondary | ICD-10-CM | POA: Diagnosis not present

## 2020-12-24 DIAGNOSIS — Z96651 Presence of right artificial knee joint: Secondary | ICD-10-CM | POA: Diagnosis not present

## 2020-12-24 DIAGNOSIS — E785 Hyperlipidemia, unspecified: Secondary | ICD-10-CM | POA: Diagnosis not present

## 2020-12-24 DIAGNOSIS — Z6841 Body Mass Index (BMI) 40.0 and over, adult: Secondary | ICD-10-CM | POA: Diagnosis not present

## 2020-12-24 DIAGNOSIS — I129 Hypertensive chronic kidney disease with stage 1 through stage 4 chronic kidney disease, or unspecified chronic kidney disease: Secondary | ICD-10-CM | POA: Diagnosis not present

## 2020-12-24 DIAGNOSIS — Z85828 Personal history of other malignant neoplasm of skin: Secondary | ICD-10-CM | POA: Diagnosis not present

## 2020-12-24 DIAGNOSIS — E039 Hypothyroidism, unspecified: Secondary | ICD-10-CM | POA: Diagnosis not present

## 2020-12-24 DIAGNOSIS — Z7982 Long term (current) use of aspirin: Secondary | ICD-10-CM | POA: Diagnosis not present

## 2020-12-24 DIAGNOSIS — K5900 Constipation, unspecified: Secondary | ICD-10-CM | POA: Diagnosis not present

## 2020-12-27 ENCOUNTER — Encounter: Payer: Self-pay | Admitting: Family Medicine

## 2020-12-27 ENCOUNTER — Other Ambulatory Visit: Payer: Self-pay

## 2020-12-27 ENCOUNTER — Ambulatory Visit (INDEPENDENT_AMBULATORY_CARE_PROVIDER_SITE_OTHER): Payer: PPO | Admitting: Family Medicine

## 2020-12-27 VITALS — BP 158/68 | HR 83 | Temp 97.6°F | Ht 60.0 in | Wt 237.0 lb

## 2020-12-27 DIAGNOSIS — L03115 Cellulitis of right lower limb: Secondary | ICD-10-CM | POA: Diagnosis not present

## 2020-12-27 DIAGNOSIS — I10 Essential (primary) hypertension: Secondary | ICD-10-CM

## 2020-12-27 MED ORDER — CEFDINIR 300 MG PO CAPS: 600.0000 mg | ORAL_CAPSULE | Freq: Every day | ORAL | 0 refills | Status: AC

## 2020-12-27 NOTE — Progress Notes (Signed)
Established Patient Office Visit  Subjective:  Patient ID: Kayla Chan, female    DOB: [birth year]  Age: 87 y.o. MRN: [id number]  CC:  Chief Complaint  Patient presents with  . Cellulitis    2 week follow up.    HPI Kayla Chan presents for cellulitis follow up. Has one more day of cephalexin. Right lower leg is feeling better, but not back to normal. No fevers, chills or sweats. Has had follow up with Dr. Marry Guan for ruptured sebaceous cyst in same leg which is improving.   She is also here to follow up for hypertension since change to aliskeran. She states she still feels dizzy after taking it, but her husband feels that it is not as bad as most of the many other blood pressure medications she has tried. It is expensive, but is on her insurance formulary.     Outpatient Medications Prior to Visit  Medication Sig Dispense Refill  . acetaminophen (TYLENOL) 325 MG tablet Take 2 tablets (650 mg total) by mouth every 6 (six) hours as needed for mild pain (or Fever >/= 101).    Marland Kitchen aliskiren (TEKTURNA) 150 MG tablet Take 1 tablet (150 mg total) by mouth daily. 30 tablet 1  . allopurinol (ZYLOPRIM) 100 MG tablet Take 0.5 tablets (50 mg total) by mouth daily.    Marland Kitchen aspirin 81 MG tablet Take 81 mg by mouth daily.    . cefdinir (OMNICEF) 300 MG capsule Take 2 capsules (600 mg total) by mouth daily for 10 days. 20 capsule 0  . colchicine 0.6 MG tablet Take 1 tablet (0.6 mg total) by mouth daily as needed (gout). 30 tablet 3  . diclofenac Sodium (VOLTAREN) 1 % GEL Apply 2 g topically 4 (four) times daily. (Patient taking differently: Apply 2 g topically 4 (four) times daily as needed (pain).) 100 g 0  . ezetimibe (ZETIA) 10 MG tablet TAKE 1 TABLET BY MOUTH EVERY DAY IN THE MORNING (Patient taking differently: Take 10 mg by mouth daily.) 90 tablet 0  . furosemide (LASIX) 20 MG tablet Take 0.5 tablets (10 mg total) by mouth daily as needed for fluid or edema.    Marland Kitchen levothyroxine (SYNTHROID) 75  MCG tablet TAKE 1 TABLET BY MOUTH EVERY DAY (Patient taking differently: Take 75 mcg by mouth daily before breakfast.) 90 tablet 0  . mometasone (ELOCON) 0.1 % ointment APPLY 1 APPLICATION DAILY AS NEEDED TOPICALLY. APPLY TO AFFECTED AREA (Patient taking differently: Apply 1 application topically daily as needed (face itching).) 15 g 2  . polyvinyl alcohol (LIQUIFILM TEARS) 1.4 % ophthalmic solution Place 1 drop into both eyes as needed for dry eyes.    Marland Kitchen tacrolimus (PROTOPIC) 0.1 % ointment Apply 1 application topically 2 (two) times daily as needed (face itching).     No facility-administered medications prior to visit.    Allergies  Allergen Reactions  . Lisinopril     Intense itching per patient can not tolerate   . Amlodipine     dizziness  . Atorvastatin     Other reaction(s): Muscle Pain  . Bactrim [Sulfamethoxazole-Trimethoprim] Itching  . Colestid  [Colestipol Hcl]     Itching, insomnia  . Crestor  [Rosuvastatin Calcium]     Other reaction(s): Muscle Pain  . Doxycycline   . Fluvastatin Sodium     Other reaction(s): Muscle Pain  . Furosemide     Severe Cramping  . Losartan     itching  . Pravastatin Sodium     Numbness  in legs  . Verapamil     Shortness of breath, swelling, palpations.   . Allopurinol Other (See Comments)    dizziness  . Penicillins Rash    Took for pneumonia when she was 59 and mader her mouth break out    ROS Review of Systems    Objective:    Physical Exam  Height 5' (1.524 m)   Body Mass Index 46.56 kg/m  Wt Readings from Last 3 Encounters:  12/11/20 238 lb 6.4 oz (108.1 kg)  12/01/20 241 lb (109.3 kg)  11/26/20 241 lb (109.3 kg)   Right anterior leg mildly tender and erythematous, but greatly improved from last visit.     Assessment & Plan:   1. Cellulitis of right lower extremity Steadily imprpving, but not resolved. refill- cefdinir (OMNICEF) 300 MG capsule; Take 2 capsules (600 mg total) by mouth daily for 10 days.   Dispense: 20 capsule; Refill: 0   2. Primary hypertension Intolerant to many blood pressures which all seem to cause dizziness. ACEis and ARBs cause intolerable itching. Seems to be tolerating aliskiren n better than previous medications. Will continue 150mg  for now and let me know if she decides to go back to one of her previous medications.   Future Appointments  Date Time Provider [place]  03/25/2021  2:40 PM Birdie Sons, MD [facility]  10/08/2021  2:00 PM [facility] HEALTH ADVISOR [facility]

## 2021-01-07 DIAGNOSIS — Z6841 Body Mass Index (BMI) 40.0 and over, adult: Secondary | ICD-10-CM | POA: Diagnosis not present

## 2021-01-07 DIAGNOSIS — Z9181 History of falling: Secondary | ICD-10-CM | POA: Diagnosis not present

## 2021-01-07 DIAGNOSIS — N1832 Chronic kidney disease, stage 3b: Secondary | ICD-10-CM | POA: Diagnosis not present

## 2021-01-07 DIAGNOSIS — Z85828 Personal history of other malignant neoplasm of skin: Secondary | ICD-10-CM | POA: Diagnosis not present

## 2021-01-07 DIAGNOSIS — I129 Hypertensive chronic kidney disease with stage 1 through stage 4 chronic kidney disease, or unspecified chronic kidney disease: Secondary | ICD-10-CM | POA: Diagnosis not present

## 2021-01-07 DIAGNOSIS — E785 Hyperlipidemia, unspecified: Secondary | ICD-10-CM | POA: Diagnosis not present

## 2021-01-07 DIAGNOSIS — M21372 Foot drop, left foot: Secondary | ICD-10-CM | POA: Diagnosis not present

## 2021-01-07 DIAGNOSIS — Z7982 Long term (current) use of aspirin: Secondary | ICD-10-CM | POA: Diagnosis not present

## 2021-01-07 DIAGNOSIS — M660 Rupture of popliteal cyst: Secondary | ICD-10-CM | POA: Diagnosis not present

## 2021-01-07 DIAGNOSIS — M8580 Other specified disorders of bone density and structure, unspecified site: Secondary | ICD-10-CM | POA: Diagnosis not present

## 2021-01-07 DIAGNOSIS — R739 Hyperglycemia, unspecified: Secondary | ICD-10-CM | POA: Diagnosis not present

## 2021-01-07 DIAGNOSIS — K5900 Constipation, unspecified: Secondary | ICD-10-CM | POA: Diagnosis not present

## 2021-01-07 DIAGNOSIS — E039 Hypothyroidism, unspecified: Secondary | ICD-10-CM | POA: Diagnosis not present

## 2021-01-07 DIAGNOSIS — L03115 Cellulitis of right lower limb: Secondary | ICD-10-CM | POA: Diagnosis not present

## 2021-01-07 DIAGNOSIS — L02611 Cutaneous abscess of right foot: Secondary | ICD-10-CM | POA: Diagnosis not present

## 2021-01-13 ENCOUNTER — Telehealth: Payer: Self-pay

## 2021-01-13 MED ORDER — CLARITHROMYCIN 500 MG PO TABS: 500.0000 mg | ORAL_TABLET | Freq: Two times a day (BID) | ORAL | 0 refills | Status: AC

## 2021-01-13 NOTE — Telephone Encounter (Signed)
Will change antibiotic to clarithromycin, [facility]

## 2021-01-13 NOTE — Telephone Encounter (Signed)
Copied from [place] [id number]. Topic: General - Other >> Jan 13, 2021 12:24 PM Leward Quan A wrote: Reason for CRM: Patient called in to inform Dr Caryn Section that her right leg is still red and swollen and she have finished her antibiotics. No appointments available until 02/03/21. Please advise Ph# [phone]

## 2021-01-14 NOTE — Telephone Encounter (Signed)
Patient called and husband says the medication has been picked up.

## 2021-01-14 NOTE — Telephone Encounter (Signed)
lmtcb okay for [facility] triage to deliver message to patient as below. KW

## 2021-01-21 ENCOUNTER — Other Ambulatory Visit: Payer: Self-pay | Admitting: Family Medicine

## 2021-01-21 ENCOUNTER — Telehealth: Payer: Self-pay

## 2021-01-21 DIAGNOSIS — I10 Essential (primary) hypertension: Secondary | ICD-10-CM

## 2021-01-21 MED ORDER — LEVOFLOXACIN 500 MG PO TABS: 500.0000 mg | ORAL_TABLET | Freq: Every day | ORAL | 0 refills | Status: AC

## 2021-01-21 NOTE — Telephone Encounter (Signed)
Patient's husband Ray advised and verbalized understanding.

## 2021-01-21 NOTE — Telephone Encounter (Signed)
Patient's husband, Jeanell Sparrow, stopped by the office asking for an appt. With Dr. Caryn Section for [place].   Her leg is very swollen and very red. She cannot take the Clarithromycin Dr. Caryn Section prescribed. Michela Pitcher it is affecting her kidneys and she cant breathe after taking this.   Was told to stop it by pharmacist.   He is afraid she is going to "lose her leg"

## 2021-01-21 NOTE — Telephone Encounter (Signed)
Have sent prescription for [facility]ofloxacin to [facility]. If that doesn' work she will need to go to ER to get IV antibiotic at hospital.

## 2021-01-22 ENCOUNTER — Telehealth: Payer: Self-pay

## 2021-01-22 NOTE — Telephone Encounter (Signed)
Copied from [place] [id number]. Topic: General - Other >> Jan 22, 2021  4:48 PM Antonieta Iba C wrote: Reason for CRM: [name] with [facility] is calling in to request an extention on patients current services. [name] would like to work with pt more on PT  frequency 1 week 6    CB:  [phone]-- secure vm

## 2021-01-22 NOTE — Telephone Encounter (Signed)
That's fine

## 2021-01-24 DIAGNOSIS — Z7982 Long term (current) use of aspirin: Secondary | ICD-10-CM | POA: Diagnosis not present

## 2021-01-24 DIAGNOSIS — E039 Hypothyroidism, unspecified: Secondary | ICD-10-CM | POA: Diagnosis not present

## 2021-01-24 DIAGNOSIS — K5900 Constipation, unspecified: Secondary | ICD-10-CM | POA: Diagnosis not present

## 2021-01-24 DIAGNOSIS — E785 Hyperlipidemia, unspecified: Secondary | ICD-10-CM | POA: Diagnosis not present

## 2021-01-24 DIAGNOSIS — M660 Rupture of popliteal cyst: Secondary | ICD-10-CM | POA: Diagnosis not present

## 2021-01-24 DIAGNOSIS — Z9181 History of falling: Secondary | ICD-10-CM | POA: Diagnosis not present

## 2021-01-24 DIAGNOSIS — Z6841 Body Mass Index (BMI) 40.0 and over, adult: Secondary | ICD-10-CM | POA: Diagnosis not present

## 2021-01-24 DIAGNOSIS — L02611 Cutaneous abscess of right foot: Secondary | ICD-10-CM | POA: Diagnosis not present

## 2021-01-24 DIAGNOSIS — I129 Hypertensive chronic kidney disease with stage 1 through stage 4 chronic kidney disease, or unspecified chronic kidney disease: Secondary | ICD-10-CM | POA: Diagnosis not present

## 2021-01-24 DIAGNOSIS — N1832 Chronic kidney disease, stage 3b: Secondary | ICD-10-CM | POA: Diagnosis not present

## 2021-01-24 DIAGNOSIS — L03115 Cellulitis of right lower limb: Secondary | ICD-10-CM | POA: Diagnosis not present

## 2021-01-24 DIAGNOSIS — R739 Hyperglycemia, unspecified: Secondary | ICD-10-CM | POA: Diagnosis not present

## 2021-01-24 DIAGNOSIS — Z85828 Personal history of other malignant neoplasm of skin: Secondary | ICD-10-CM | POA: Diagnosis not present

## 2021-01-24 DIAGNOSIS — M21372 Foot drop, left foot: Secondary | ICD-10-CM | POA: Diagnosis not present

## 2021-01-24 DIAGNOSIS — M8580 Other specified disorders of bone density and structure, unspecified site: Secondary | ICD-10-CM | POA: Diagnosis not present

## 2021-01-24 NOTE — Telephone Encounter (Signed)
[name] advised.   Thanks,   -Mickel Baas

## 2021-01-26 ENCOUNTER — Other Ambulatory Visit: Payer: Self-pay | Admitting: Family Medicine

## 2021-01-26 DIAGNOSIS — E039 Hypothyroidism, unspecified: Secondary | ICD-10-CM

## 2021-01-26 DIAGNOSIS — E785 Hyperlipidemia, unspecified: Secondary | ICD-10-CM

## 2021-01-26 NOTE — Telephone Encounter (Signed)
Requested Prescriptions  Pending Prescriptions Disp Refills  . ezetimibe (ZETIA) 10 MG tablet [Pharmacy Med Name: EZETIMIBE 10 MG TABLET] 90 tablet 0    Sig: TAKE 1 TABLET BY MOUTH EVERY DAY IN THE MORNING     Cardiovascular:  Antilipid - Sterol Transport Inhibitors Failed - 01/26/2021 12:54 AM      Failed - Total Cholesterol in normal range and within 360 days    Cholesterol, Total  Date Value Ref Range Status  09/15/2019 267 (H) 100 - 199 mg/dL Final         Failed - LDL in normal range and within 360 days    LDL Chol Calc (NIH)  Date Value Ref Range Status  09/15/2019 165 (H) 0 - 99 mg/dL Final         Failed - HDL in normal range and within 360 days    HDL  Date Value Ref Range Status  09/15/2019 53 >39 mg/dL Final         Failed - Triglycerides in normal range and within 360 days    Triglycerides  Date Value Ref Range Status  09/15/2019 260 (H) 0 - 149 mg/dL Final         Passed - Valid encounter within last 12 months    Recent Outpatient Visits          1 month ago Cellulitis of right lower extremity   [facility] Birdie Sons, MD   1 month ago Contusion of right lower extremity, initial encounter   [facility] Birdie Sons, MD   2 months ago Right leg swelling   [facility] Birdie Sons, MD   2 months ago Constipation, unspecified constipation type   [place], Adriana M, PA-C   5 months ago Primary hypertension   [place], Kirstie Peri, MD      Future Appointments            In 1 month Fisher, Kirstie Peri, MD [facility]           . levothyroxine (SYNTHROID) 75 MCG tablet [Pharmacy Med Name: LEVOTHYROXINE 75 MCG TABLET] 90 tablet 2    Sig: TAKE 1 TABLET BY MOUTH EVERY DAY     Endocrinology:  Hypothyroid Agents Failed - 01/26/2021 12:54 AM      Failed - TSH needs to be rechecked within 3 months after an abnormal result. Refill until TSH  is due.      Passed - TSH in normal range and within 360 days    TSH  Date Value Ref Range Status  08/19/2020 2.470 0.450 - 4.500 uIU/mL Final         Passed - Valid encounter within last 12 months    Recent Outpatient Visits          1 month ago Cellulitis of right lower extremity   [place], MD   1 month ago Contusion of right lower extremity, initial encounter   [facility] Birdie Sons, MD   2 months ago Right leg swelling   [facility] Birdie Sons, MD   2 months ago Constipation, unspecified constipation type   [place], [place], [place]   5 months ago Primary hypertension   [place], MD      Future Appointments            In 1 month Fisher,  Kirstie Peri, MD [facility], [place]

## 2021-01-28 ENCOUNTER — Other Ambulatory Visit
Admission: RE | Admit: 2021-01-28 | Discharge: 2021-01-28 | Disposition: A | Discharge status: Home / Self Care | Payer: PPO | Source: Ambulatory Visit | Attending: Student | Admitting: Student

## 2021-01-28 DIAGNOSIS — L03115 Cellulitis of right lower limb: Secondary | ICD-10-CM | POA: Diagnosis not present

## 2021-01-28 DIAGNOSIS — I10 Essential (primary) hypertension: Secondary | ICD-10-CM | POA: Diagnosis not present

## 2021-01-28 DIAGNOSIS — R600 Localized edema: Secondary | ICD-10-CM | POA: Insufficient documentation

## 2021-01-28 DIAGNOSIS — R42 Dizziness and giddiness: Secondary | ICD-10-CM | POA: Diagnosis not present

## 2021-01-28 LAB — BRAIN NATRIURETIC PEPTIDE: B Natriuretic Peptide: 65.1 pg/mL (ref 0.0–100.0)

## 2021-01-29 ENCOUNTER — Other Ambulatory Visit: Payer: Self-pay | Admitting: Family Medicine

## 2021-01-29 DIAGNOSIS — I10 Essential (primary) hypertension: Secondary | ICD-10-CM

## 2021-01-29 NOTE — Telephone Encounter (Signed)
   Notes to clinic:  requesting a 90 day supply   Requested Prescriptions  Pending Prescriptions Disp Refills   aliskiren (TEKTURNA) 150 MG tablet [Pharmacy Med Name: ALISKIREN 150 MG TABLET] 90 tablet 1    Sig: TAKE 1 TABLET BY MOUTH EVERY DAY      Cardiovascular:  Renin Inhibitors Failed - 01/29/2021  8:51 AM      Failed - Cr in normal range and within 360 days    Creatinine  Date Value Ref Range Status  08/05/2013 1.10 0.60 - 1.30 mg/dL Final   Creatinine, Ser  Date Value Ref Range Status  12/03/2020 1.11 (H) 0.44 - 1.00 mg/dL Final          Failed - Last BP in normal range    BP Readings from Last 1 Encounters:  12/27/20 (!) 158/68          Passed - K in normal range and within 360 days    Potassium  Date Value Ref Range Status  12/02/2020 4.0 3.5 - 5.1 mmol/L Final  08/05/2013 3.8 3.5 - 5.1 mmol/L Final          Passed - Valid encounter within last 12 months    Recent Outpatient Visits           1 month ago Cellulitis of right lower extremity   [facility] Birdie Sons, MD   1 month ago Contusion of right lower extremity, initial encounter   [facility] Birdie Sons, MD   2 months ago Right leg swelling   [facility] Birdie Sons, MD   2 months ago Constipation, unspecified constipation type   [place], [place], PA-C   5 months ago Primary hypertension   [facility] Birdie Sons, MD       Future Appointments             In 1 month Fisher, Kirstie Peri, MD [facility], [place]

## 2021-02-04 DIAGNOSIS — I10 Essential (primary) hypertension: Secondary | ICD-10-CM | POA: Diagnosis not present

## 2021-02-04 DIAGNOSIS — R55 Syncope and collapse: Secondary | ICD-10-CM | POA: Insufficient documentation

## 2021-02-04 DIAGNOSIS — R42 Dizziness and giddiness: Secondary | ICD-10-CM | POA: Diagnosis not present

## 2021-02-04 DIAGNOSIS — I491 Atrial premature depolarization: Secondary | ICD-10-CM | POA: Diagnosis not present

## 2021-02-04 DIAGNOSIS — E785 Hyperlipidemia, unspecified: Secondary | ICD-10-CM | POA: Diagnosis not present

## 2021-02-04 DIAGNOSIS — N1831 Chronic kidney disease, stage 3a: Secondary | ICD-10-CM | POA: Diagnosis not present

## 2021-02-05 DIAGNOSIS — M21372 Foot drop, left foot: Secondary | ICD-10-CM | POA: Diagnosis not present

## 2021-02-05 DIAGNOSIS — Z6841 Body Mass Index (BMI) 40.0 and over, adult: Secondary | ICD-10-CM | POA: Diagnosis not present

## 2021-02-05 DIAGNOSIS — Z9181 History of falling: Secondary | ICD-10-CM | POA: Diagnosis not present

## 2021-02-05 DIAGNOSIS — E785 Hyperlipidemia, unspecified: Secondary | ICD-10-CM | POA: Diagnosis not present

## 2021-02-05 DIAGNOSIS — E039 Hypothyroidism, unspecified: Secondary | ICD-10-CM | POA: Diagnosis not present

## 2021-02-05 DIAGNOSIS — I129 Hypertensive chronic kidney disease with stage 1 through stage 4 chronic kidney disease, or unspecified chronic kidney disease: Secondary | ICD-10-CM | POA: Diagnosis not present

## 2021-02-05 DIAGNOSIS — L02611 Cutaneous abscess of right foot: Secondary | ICD-10-CM | POA: Diagnosis not present

## 2021-02-05 DIAGNOSIS — Z7982 Long term (current) use of aspirin: Secondary | ICD-10-CM | POA: Diagnosis not present

## 2021-02-05 DIAGNOSIS — R739 Hyperglycemia, unspecified: Secondary | ICD-10-CM | POA: Diagnosis not present

## 2021-02-05 DIAGNOSIS — N1832 Chronic kidney disease, stage 3b: Secondary | ICD-10-CM | POA: Diagnosis not present

## 2021-02-05 DIAGNOSIS — M8580 Other specified disorders of bone density and structure, unspecified site: Secondary | ICD-10-CM | POA: Diagnosis not present

## 2021-02-05 DIAGNOSIS — M660 Rupture of popliteal cyst: Secondary | ICD-10-CM | POA: Diagnosis not present

## 2021-02-05 DIAGNOSIS — L03115 Cellulitis of right lower limb: Secondary | ICD-10-CM | POA: Diagnosis not present

## 2021-02-05 DIAGNOSIS — Z85828 Personal history of other malignant neoplasm of skin: Secondary | ICD-10-CM | POA: Diagnosis not present

## 2021-02-05 DIAGNOSIS — K5900 Constipation, unspecified: Secondary | ICD-10-CM | POA: Diagnosis not present

## 2021-02-11 DIAGNOSIS — Z7982 Long term (current) use of aspirin: Secondary | ICD-10-CM | POA: Diagnosis not present

## 2021-02-11 DIAGNOSIS — R739 Hyperglycemia, unspecified: Secondary | ICD-10-CM | POA: Diagnosis not present

## 2021-02-11 DIAGNOSIS — Z9181 History of falling: Secondary | ICD-10-CM | POA: Diagnosis not present

## 2021-02-11 DIAGNOSIS — L03115 Cellulitis of right lower limb: Secondary | ICD-10-CM | POA: Diagnosis not present

## 2021-02-11 DIAGNOSIS — Z85828 Personal history of other malignant neoplasm of skin: Secondary | ICD-10-CM | POA: Diagnosis not present

## 2021-02-11 DIAGNOSIS — K5900 Constipation, unspecified: Secondary | ICD-10-CM | POA: Diagnosis not present

## 2021-02-11 DIAGNOSIS — N1832 Chronic kidney disease, stage 3b: Secondary | ICD-10-CM | POA: Diagnosis not present

## 2021-02-11 DIAGNOSIS — L02611 Cutaneous abscess of right foot: Secondary | ICD-10-CM | POA: Diagnosis not present

## 2021-02-11 DIAGNOSIS — E785 Hyperlipidemia, unspecified: Secondary | ICD-10-CM | POA: Diagnosis not present

## 2021-02-11 DIAGNOSIS — M660 Rupture of popliteal cyst: Secondary | ICD-10-CM | POA: Diagnosis not present

## 2021-02-11 DIAGNOSIS — Z6841 Body Mass Index (BMI) 40.0 and over, adult: Secondary | ICD-10-CM | POA: Diagnosis not present

## 2021-02-11 DIAGNOSIS — M8580 Other specified disorders of bone density and structure, unspecified site: Secondary | ICD-10-CM | POA: Diagnosis not present

## 2021-02-11 DIAGNOSIS — I129 Hypertensive chronic kidney disease with stage 1 through stage 4 chronic kidney disease, or unspecified chronic kidney disease: Secondary | ICD-10-CM | POA: Diagnosis not present

## 2021-02-11 DIAGNOSIS — M21372 Foot drop, left foot: Secondary | ICD-10-CM | POA: Diagnosis not present

## 2021-02-11 DIAGNOSIS — E039 Hypothyroidism, unspecified: Secondary | ICD-10-CM | POA: Diagnosis not present

## 2021-02-14 DIAGNOSIS — M21372 Foot drop, left foot: Secondary | ICD-10-CM | POA: Diagnosis not present

## 2021-02-14 DIAGNOSIS — N1832 Chronic kidney disease, stage 3b: Secondary | ICD-10-CM | POA: Diagnosis not present

## 2021-02-14 DIAGNOSIS — R739 Hyperglycemia, unspecified: Secondary | ICD-10-CM | POA: Diagnosis not present

## 2021-02-14 DIAGNOSIS — M8580 Other specified disorders of bone density and structure, unspecified site: Secondary | ICD-10-CM | POA: Diagnosis not present

## 2021-02-14 DIAGNOSIS — E785 Hyperlipidemia, unspecified: Secondary | ICD-10-CM | POA: Diagnosis not present

## 2021-02-14 DIAGNOSIS — L03115 Cellulitis of right lower limb: Secondary | ICD-10-CM | POA: Diagnosis not present

## 2021-02-14 DIAGNOSIS — L02611 Cutaneous abscess of right foot: Secondary | ICD-10-CM | POA: Diagnosis not present

## 2021-02-14 DIAGNOSIS — I129 Hypertensive chronic kidney disease with stage 1 through stage 4 chronic kidney disease, or unspecified chronic kidney disease: Secondary | ICD-10-CM | POA: Diagnosis not present

## 2021-02-14 DIAGNOSIS — K5900 Constipation, unspecified: Secondary | ICD-10-CM | POA: Diagnosis not present

## 2021-02-14 DIAGNOSIS — M660 Rupture of popliteal cyst: Secondary | ICD-10-CM | POA: Diagnosis not present

## 2021-02-14 DIAGNOSIS — E039 Hypothyroidism, unspecified: Secondary | ICD-10-CM | POA: Diagnosis not present

## 2021-02-26 DIAGNOSIS — R42 Dizziness and giddiness: Secondary | ICD-10-CM | POA: Diagnosis not present

## 2021-02-26 DIAGNOSIS — R55 Syncope and collapse: Secondary | ICD-10-CM | POA: Diagnosis not present

## 2021-02-28 DIAGNOSIS — K5900 Constipation, unspecified: Secondary | ICD-10-CM

## 2021-02-28 DIAGNOSIS — L03115 Cellulitis of right lower limb: Secondary | ICD-10-CM | POA: Diagnosis not present

## 2021-02-28 DIAGNOSIS — E039 Hypothyroidism, unspecified: Secondary | ICD-10-CM

## 2021-02-28 DIAGNOSIS — M660 Rupture of popliteal cyst: Secondary | ICD-10-CM | POA: Diagnosis not present

## 2021-02-28 DIAGNOSIS — M8580 Other specified disorders of bone density and structure, unspecified site: Secondary | ICD-10-CM

## 2021-02-28 DIAGNOSIS — I129 Hypertensive chronic kidney disease with stage 1 through stage 4 chronic kidney disease, or unspecified chronic kidney disease: Secondary | ICD-10-CM

## 2021-02-28 DIAGNOSIS — Z7982 Long term (current) use of aspirin: Secondary | ICD-10-CM | POA: Diagnosis not present

## 2021-02-28 DIAGNOSIS — Z9181 History of falling: Secondary | ICD-10-CM | POA: Diagnosis not present

## 2021-02-28 DIAGNOSIS — Z85828 Personal history of other malignant neoplasm of skin: Secondary | ICD-10-CM | POA: Diagnosis not present

## 2021-02-28 DIAGNOSIS — N1832 Chronic kidney disease, stage 3b: Secondary | ICD-10-CM

## 2021-02-28 DIAGNOSIS — Z6841 Body Mass Index (BMI) 40.0 and over, adult: Secondary | ICD-10-CM | POA: Diagnosis not present

## 2021-02-28 DIAGNOSIS — E785 Hyperlipidemia, unspecified: Secondary | ICD-10-CM

## 2021-02-28 DIAGNOSIS — L02611 Cutaneous abscess of right foot: Secondary | ICD-10-CM | POA: Diagnosis not present

## 2021-02-28 DIAGNOSIS — R739 Hyperglycemia, unspecified: Secondary | ICD-10-CM

## 2021-02-28 DIAGNOSIS — M21372 Foot drop, left foot: Secondary | ICD-10-CM | POA: Diagnosis not present

## 2021-03-03 DIAGNOSIS — R55 Syncope and collapse: Secondary | ICD-10-CM | POA: Diagnosis not present

## 2021-03-03 DIAGNOSIS — R42 Dizziness and giddiness: Secondary | ICD-10-CM | POA: Diagnosis not present

## 2021-03-03 DIAGNOSIS — I6523 Occlusion and stenosis of bilateral carotid arteries: Secondary | ICD-10-CM | POA: Diagnosis not present

## 2021-03-11 DIAGNOSIS — I6523 Occlusion and stenosis of bilateral carotid arteries: Secondary | ICD-10-CM | POA: Diagnosis not present

## 2021-03-11 DIAGNOSIS — R600 Localized edema: Secondary | ICD-10-CM | POA: Insufficient documentation

## 2021-03-11 DIAGNOSIS — I10 Essential (primary) hypertension: Secondary | ICD-10-CM | POA: Diagnosis not present

## 2021-03-11 DIAGNOSIS — N1831 Chronic kidney disease, stage 3a: Secondary | ICD-10-CM | POA: Diagnosis not present

## 2021-03-11 DIAGNOSIS — E782 Mixed hyperlipidemia: Secondary | ICD-10-CM | POA: Diagnosis not present

## 2021-03-12 DIAGNOSIS — R600 Localized edema: Secondary | ICD-10-CM | POA: Diagnosis not present

## 2021-03-12 DIAGNOSIS — N1831 Chronic kidney disease, stage 3a: Secondary | ICD-10-CM | POA: Diagnosis not present

## 2021-03-12 DIAGNOSIS — L03115 Cellulitis of right lower limb: Secondary | ICD-10-CM | POA: Diagnosis not present

## 2021-03-12 DIAGNOSIS — E039 Hypothyroidism, unspecified: Secondary | ICD-10-CM | POA: Diagnosis not present

## 2021-03-12 DIAGNOSIS — M1990 Unspecified osteoarthritis, unspecified site: Secondary | ICD-10-CM | POA: Diagnosis not present

## 2021-03-12 DIAGNOSIS — I10 Essential (primary) hypertension: Secondary | ICD-10-CM | POA: Diagnosis not present

## 2021-03-13 DIAGNOSIS — K5900 Constipation, unspecified: Secondary | ICD-10-CM | POA: Diagnosis not present

## 2021-03-13 DIAGNOSIS — M21372 Foot drop, left foot: Secondary | ICD-10-CM | POA: Diagnosis not present

## 2021-03-13 DIAGNOSIS — R739 Hyperglycemia, unspecified: Secondary | ICD-10-CM | POA: Diagnosis not present

## 2021-03-13 DIAGNOSIS — M8580 Other specified disorders of bone density and structure, unspecified site: Secondary | ICD-10-CM | POA: Diagnosis not present

## 2021-03-13 DIAGNOSIS — Z85828 Personal history of other malignant neoplasm of skin: Secondary | ICD-10-CM | POA: Diagnosis not present

## 2021-03-13 DIAGNOSIS — I129 Hypertensive chronic kidney disease with stage 1 through stage 4 chronic kidney disease, or unspecified chronic kidney disease: Secondary | ICD-10-CM | POA: Diagnosis not present

## 2021-03-13 DIAGNOSIS — L02611 Cutaneous abscess of right foot: Secondary | ICD-10-CM | POA: Diagnosis not present

## 2021-03-13 DIAGNOSIS — M660 Rupture of popliteal cyst: Secondary | ICD-10-CM | POA: Diagnosis not present

## 2021-03-13 DIAGNOSIS — L03115 Cellulitis of right lower limb: Secondary | ICD-10-CM | POA: Diagnosis not present

## 2021-03-13 DIAGNOSIS — N1832 Chronic kidney disease, stage 3b: Secondary | ICD-10-CM | POA: Diagnosis not present

## 2021-03-13 DIAGNOSIS — Z9181 History of falling: Secondary | ICD-10-CM | POA: Diagnosis not present

## 2021-03-13 DIAGNOSIS — E785 Hyperlipidemia, unspecified: Secondary | ICD-10-CM | POA: Diagnosis not present

## 2021-03-13 DIAGNOSIS — E039 Hypothyroidism, unspecified: Secondary | ICD-10-CM | POA: Diagnosis not present

## 2021-03-13 DIAGNOSIS — Z6841 Body Mass Index (BMI) 40.0 and over, adult: Secondary | ICD-10-CM | POA: Diagnosis not present

## 2021-03-13 DIAGNOSIS — Z7982 Long term (current) use of aspirin: Secondary | ICD-10-CM | POA: Diagnosis not present

## 2021-03-20 ENCOUNTER — Other Ambulatory Visit: Payer: Self-pay

## 2021-03-20 ENCOUNTER — Ambulatory Visit (INDEPENDENT_AMBULATORY_CARE_PROVIDER_SITE_OTHER): Payer: PPO | Admitting: Nurse Practitioner

## 2021-03-20 ENCOUNTER — Encounter (INDEPENDENT_AMBULATORY_CARE_PROVIDER_SITE_OTHER): Payer: Self-pay | Admitting: Nurse Practitioner

## 2021-03-20 VITALS — BP 176/83 | HR 80 | Ht 60.0 in | Wt 238.0 lb

## 2021-03-20 DIAGNOSIS — N1832 Chronic kidney disease, stage 3b: Secondary | ICD-10-CM

## 2021-03-20 DIAGNOSIS — M660 Rupture of popliteal cyst: Secondary | ICD-10-CM

## 2021-03-20 DIAGNOSIS — I890 Lymphedema, not elsewhere classified: Secondary | ICD-10-CM | POA: Diagnosis not present

## 2021-03-20 NOTE — Progress Notes (Signed)
Subjective:    Patient ID: Kayla Chan, female    DOB: [birth year], 87 y.o.   MRN: [id number] Chief Complaint  Patient presents with   New Patient (Initial Visit)    Np. Consult LE edema,referred by Adrian Prows    Kayla Chan is an 87 year old female that presents today for follow-up evaluation due to swelling of her lower extremities.  The patient has always had swelling off and on for the last several years but it has been relatively well controlled with her use of compression socks.  However recently the patient had a fall and this caused a Baker's cyst rupture.  Following the rupture of the Baker's cyst the patient has severe bruising and discoloration throughout her right lower extremity.  She had worsening swelling and it was complicated by cellulitis.  Since that time the swelling and the discoloration have resolved but she still tends to have more significant swelling in her right lower extremity.  Left swells just not as badly.  The compression does help as well as elevation.  The patient is not able to ambulate for significantly long distances but she tries to be as active as possible.  Denies any open wounds.  She denies any fevers or chills.   Review of Systems  Cardiovascular:  Positive for leg swelling.  Skin:  Negative for wound.  Neurological:  Positive for weakness.  All other systems reviewed and are negative.     Objective:   Physical Exam Vitals reviewed.  HENT:     Head: Normocephalic.  Cardiovascular:     Rate and Rhythm: Normal rate.     Pulses: Normal pulses.  Pulmonary:     Effort: Pulmonary effort is normal.  Musculoskeletal:     Right lower leg: 3+ Edema present.     Left lower leg: 2+ Edema present.  Skin:    Comments: Dermal thickening bilaterally  Neurological:     Mental Status: She is alert and oriented to person, place, and time.  Psychiatric:        Mood and Affect: Mood normal.        Behavior: Behavior normal.        Thought  Content: Thought content normal.        Judgment: Judgment normal.    BP (!) 176/83   Pulse 80   Ht 5' (1.524 m)   Wt 238 lb (108 kg)   BMI 46.48 kg/m   Past Medical History:  Diagnosis Date   Cancer ([place])    BCC on nose   Hyperlipidemia    Hypertension     Social History   Socioeconomic History   Marital status: Married    Spouse name: Not on file   Number of children: 0   Years of education: Not on file   Highest education level: Some college, no degree  Occupational History   Occupation: Retired  Tobacco Use   Smoking status: Never   Smokeless tobacco: Never  Vaping Use   Vaping Use: Never used  Substance and Sexual Activity   Alcohol use: Yes    Alcohol/week: 0.0 standard drinks    Comment: 1 glass of wine seldom   Drug use: No   Sexual activity: Not on file  Other Topics Concern   Not on file  Social History Narrative   Not on file   Social Determinants of Health   Financial Resource Strain: Low Risk    Difficulty of Paying Living Expenses: Not hard at all  Food  Insecurity: No Food Insecurity   Worried About Charity fundraiser in the Last Year: Never true   Ran Out of Food in the Last Year: Never true  Transportation Needs: No Transportation Needs   Lack of Transportation (Medical): No   Lack of Transportation (Non-Medical): No  Physical Activity: Inactive   Days of Exercise per Week: 0 days   Minutes of Exercise per Session: 0 min  Stress: No Stress Concern Present   Feeling of Stress : Not at all  Social Connections: Moderately Integrated   Frequency of Communication with Friends and Family: More than three times a week   Frequency of Social Gatherings with Friends and Family: More than three times a week   Attends Religious Services: More than 4 times per year   Active Member of Clubs or Organizations: No   Attends Archivist Meetings: Never   Marital Status: Married  Human resources officer Violence: Not At Risk   Fear of Current or  Ex-Partner: No   Emotionally Abused: No   Physically Abused: No   Sexually Abused: No    Past Surgical History:  Procedure Laterality Date   24 hour Holter Monitor  01/2013   4550 ectopic beats with 344 superventricular bigemminy events   Abdominal Ultrasound  05/31/2013   RUQ. Fatty Liver   APPENDECTOMY  1950   [place]   REPLACEMENT TOTAL KNEE  09/10/2011   Inpatient, [place], [place]; Right    Family History  Problem Relation Age of Onset   Stroke Mother        possible stroke   Heart Problems Father    Kidney cancer Sister    Bladder Cancer Brother     Allergies  Allergen Reactions   Lisinopril     Intense itching per patient can not tolerate    Amlodipine     dizziness   Atorvastatin     Other reaction(s): Muscle Pain   Bactrim [Sulfamethoxazole-Trimethoprim] Itching   Colestid  [Colestipol Hcl]     Itching, insomnia   Colestipol Other (See Comments)    insomnia   Crestor  [Rosuvastatin Calcium]     Other reaction(s): Muscle Pain   Doxycycline    Fluvastatin Sodium     Other reaction(s): Muscle Pain   Furosemide     Severe Cramping   Labetalol Other (See Comments)   Losartan     itching   Pravastatin Sodium     Numbness in legs   Verapamil     Shortness of breath, swelling, palpations.    Allopurinol Other (See Comments)    dizziness   Penicillins Rash    Took for pneumonia when she was 23 and mader her mouth break out    CBC Latest Ref Rng & Units 12/02/2020 12/01/2020 08/19/2020  WBC 4.0 - 10.5 K/uL 8.5 9.0 8.1  Hemoglobin 12.0 - 15.0 g/dL 11.7(L) 13.1 13.7  Hematocrit 36.0 - 46.0 % 36.2 40.6 41.6  Platelets 150 - 400 K/uL 319 338 325      CMP     Component Value Date/Time   NA 139 12/02/2020 0527   NA 142 08/19/2020 1358   NA 137 08/05/2013 1238   K 4.0 12/02/2020 0527   K 3.8 08/05/2013 1238   CL 110 12/02/2020 0527   CL 104 08/05/2013 1238   CO2 22 12/02/2020 0527   CO2 26 08/05/2013 1238   GLUCOSE 116 (H) 12/02/2020  0527   GLUCOSE 125 (H) 08/05/2013 1238   BUN 29 (H)  12/02/2020 0527   BUN 23 08/19/2020 1358   BUN 19 (H) 08/05/2013 1238   CREATININE 1.11 (H) 12/03/2020 0821   CREATININE 1.10 08/05/2013 1238   CALCIUM 8.4 (L) 12/02/2020 0527   CALCIUM 9.5 08/05/2013 1238   PROT 6.0 (L) 12/02/2020 0527   PROT 6.4 08/19/2020 1358   PROT 7.7 08/05/2013 1238   ALBUMIN 2.9 (L) 12/02/2020 0527   ALBUMIN 3.8 08/19/2020 1358   ALBUMIN 3.5 08/05/2013 1238   AST 25 12/02/2020 0527   AST 10 (L) 08/05/2013 1238   ALT 20 12/02/2020 0527   ALT 16 08/05/2013 1238   ALKPHOS 98 12/02/2020 0527   ALKPHOS 82 08/05/2013 1238   BILITOT 0.8 12/02/2020 0527   BILITOT 0.3 08/19/2020 1358   BILITOT 0.4 08/05/2013 1238   GFRNONAA 48 (L) 12/03/2020 0821   GFRNONAA 48 (L) 08/05/2013 1238   GFRAA 53 (L) 08/19/2020 1358   GFRAA 55 (L) 08/05/2013 1238     No results found.     Assessment & Plan:   1. Lymphedema Recommend:  No surgery or intervention at this point in time.    I have reviewed my previous discussion with the patient regarding swelling and why it causes symptoms.  Patient will continue wearing graduated compression stockings class 1 (20-30 mmHg) on a daily basis. The patient will  beginning wearing the stockings first thing in the morning and removing them in the evening. The patient is instructed specifically not to sleep in the stockings.    In addition, behavioral modification including several periods of elevation of the lower extremities during the day will be continued.  This was reviewed with the patient during the initial visit.  The patient will also continue routine exercise, especially walking on a daily basis as was discussed during the initial visit.    Despite conservative treatments of at least 4 weeks including graduated compression therapy class 1 and behavioral modification including exercise and elevation the patient  has not obtained adequate control of the lymphedema.  The patient  still has stage 3 lymphedema and therefore, I believe that a lymph pump should be added to improve the control of the patient's lymphedema.  Additionally, a lymph pump is warranted because it will reduce the risk of cellulitis and ulceration in the future.  Patient should follow-up in six months    2. Stage 3b chronic kidney disease ([place]) This is likely exacerbating her lymphedema  3. Baker's cyst, ruptured This is likely the catalyst for worsening swelling and edema.   Current Outpatient Medications on File Prior to Visit  Medication Sig Dispense Refill   acetaminophen (TYLENOL) 325 MG tablet Take 2 tablets (650 mg total) by mouth every 6 (six) hours as needed for mild pain (or Fever >/= 101).     aliskiren (TEKTURNA) 150 MG tablet TAKE 1 TABLET BY MOUTH EVERY DAY 90 tablet 4   allopurinol (ZYLOPRIM) 100 MG tablet Take 0.5 tablets (50 mg total) by mouth daily.     aspirin 81 MG tablet Take 81 mg by mouth daily.     colchicine 0.6 MG tablet Take 1 tablet (0.6 mg total) by mouth daily as needed (gout). 30 tablet 3   diclofenac Sodium (VOLTAREN) 1 % GEL Apply 2 g topically 4 (four) times daily. (Patient taking differently: Apply 2 g topically 4 (four) times daily as needed (pain).) 100 g 0   ezetimibe (ZETIA) 10 MG tablet TAKE 1 TABLET BY MOUTH EVERY DAY IN THE MORNING 90 tablet 0  furosemide (LASIX) 20 MG tablet Take 0.5 tablets (10 mg total) by mouth daily as needed for fluid or edema.     levothyroxine (SYNTHROID) 75 MCG tablet TAKE 1 TABLET BY MOUTH EVERY DAY 90 tablet 2   mometasone (ELOCON) 0.1 % ointment APPLY 1 APPLICATION DAILY AS NEEDED TOPICALLY. APPLY TO AFFECTED AREA (Patient taking differently: Apply 1 application topically daily as needed (face itching).) 15 g 2   polyvinyl alcohol (LIQUIFILM TEARS) 1.4 % ophthalmic solution Place 1 drop into both eyes as needed for dry eyes.     tacrolimus (PROTOPIC) 0.1 % ointment Apply 1 application topically 2 (two) times daily as needed  (face itching).     No current facility-administered medications on file prior to visit.    There are no Patient Instructions on file for this visit. No follow-ups on file.   Kris Hartmann, NP

## 2021-03-25 ENCOUNTER — Other Ambulatory Visit: Payer: Self-pay

## 2021-03-25 ENCOUNTER — Encounter: Payer: Self-pay | Admitting: Family Medicine

## 2021-03-25 ENCOUNTER — Ambulatory Visit (INDEPENDENT_AMBULATORY_CARE_PROVIDER_SITE_OTHER): Payer: PPO | Admitting: Family Medicine

## 2021-03-25 VITALS — BP 153/65 | HR 88 | Wt 242.0 lb

## 2021-03-25 DIAGNOSIS — I10 Essential (primary) hypertension: Secondary | ICD-10-CM | POA: Diagnosis not present

## 2021-03-25 DIAGNOSIS — M5432 Sciatica, left side: Secondary | ICD-10-CM | POA: Diagnosis not present

## 2021-03-25 DIAGNOSIS — I890 Lymphedema, not elsewhere classified: Secondary | ICD-10-CM | POA: Diagnosis not present

## 2021-03-25 MED ORDER — CHLORTHALIDONE 25 MG PO TABS: 12.5000 mg | ORAL_TABLET | Freq: Every day | ORAL | 1 refills | Status: DC

## 2021-03-25 MED ORDER — CYCLOBENZAPRINE HCL 5 MG PO TABS: ORAL_TABLET | ORAL | 1 refills | Status: DC

## 2021-03-25 NOTE — Progress Notes (Signed)
Established patient visit   Patient: Kayla Chan   DOB: [birth year]   87 y.o. Female  MRN: [id number] Visit Date: 03/25/2021  Today's healthcare provider: Lelon Huh, MD   Chief Complaint  Patient presents with   Back Pain   Subjective    Back Pain This is a new problem. The current episode started in the past 7 days. The problem has been gradually improving since onset. The pain is present in the lumbar spine. The quality of the pain is described as cramping. Radiates to: Left hip and leg. Associated symptoms include leg pain. Pertinent negatives include no abdominal pain, chest pain, fever, headaches, numbness or weakness. Treatments tried: Tylenol. The treatment provided mild relief.   Hypertension, follow-up  BP Readings from Last 3 Encounters:  03/25/21 (!) 153/65  03/20/21 (!) 176/83  12/27/20 (!) 158/68   Wt Readings from Last 3 Encounters:  03/25/21 242 lb (109.8 kg)  03/20/21 238 lb (108 kg)  12/27/20 237 lb (107.5 kg)     She was last seen for hypertension 3 months ago.  BP at that visit was 158/68. Management since that visit includes; Intolerant to many blood pressures which all seem to cause dizziness. ACEis and ARBs cause intolerable itching. She had been changed to [facility] which she was tolerating, but was very expensive. Has since started back on chlorthalidone 1/2 x '25mg'$  a day which was last prescribed in 2021, but seems to be tolerating now.  She reports excellent compliance with treatment. She is not having side effects.  She is exercising. She is adherent to low salt diet.   Outside blood pressures are normal at home.  She does not smoke.  Use of agents associated with hypertension: none.   --------------------------------------------------------------------------------------------------- She has been having persistent left low back pain radiating into left leg c/w previous episodes of sciatica. Was previously prescribed cyclobenzaprine which  she thinks was helpful. Also continues to have persistent dependent edema after rupture of bakers cyst last month Has been to [facility] and is in process of getting lymph pump.      Medications: Outpatient Medications Prior to Visit  Medication Sig   acetaminophen (TYLENOL) 325 MG tablet Take 2 tablets (650 mg total) by mouth every 6 (six) hours as needed for mild pain (or Fever >/= 101).   allopurinol (ZYLOPRIM) 100 MG tablet Take 0.5 tablets (50 mg total) by mouth daily.   aspirin 81 MG tablet Take 81 mg by mouth daily.   chlorthalidone (HYGROTON) 25 MG tablet Take 12.5 mg by mouth daily.   colchicine 0.6 MG tablet Take 1 tablet (0.6 mg total) by mouth daily as needed (gout).   diclofenac Sodium (VOLTAREN) 1 % GEL Apply 2 g topically 4 (four) times daily. (Patient taking differently: Apply 2 g topically 4 (four) times daily as needed (pain).)   ezetimibe (ZETIA) 10 MG tablet TAKE 1 TABLET BY MOUTH EVERY DAY IN THE MORNING   levothyroxine (SYNTHROID) 75 MCG tablet TAKE 1 TABLET BY MOUTH EVERY DAY   mometasone (ELOCON) 0.1 % ointment APPLY 1 APPLICATION DAILY AS NEEDED TOPICALLY. APPLY TO AFFECTED AREA (Patient taking differently: Apply 1 application topically daily as needed (face itching).)   polyvinyl alcohol (LIQUIFILM TEARS) 1.4 % ophthalmic solution Place 1 drop into both eyes as needed for dry eyes.   tacrolimus (PROTOPIC) 0.1 % ointment Apply 1 application topically 2 (two) times daily as needed (face itching).   aliskiren (TEKTURNA) 150 MG tablet TAKE 1  TABLET BY MOUTH EVERY DAY (Patient not taking: Reported on 03/25/2021)   furosemide (LASIX) 20 MG tablet Take 0.5 tablets (10 mg total) by mouth daily as needed for fluid or edema. (Patient not taking: Reported on 03/25/2021)   No facility-administered medications prior to visit.    Review of Systems  Constitutional: Negative.  Negative for appetite change, chills, fatigue and fever.  Respiratory: Negative.  Negative  for chest tightness and shortness of breath.   Cardiovascular:  Positive for leg swelling. Negative for chest pain and palpitations.  Gastrointestinal: Negative.  Negative for abdominal pain, nausea and vomiting.  Musculoskeletal:  Positive for arthralgias, back pain and myalgias. Negative for gait problem, joint swelling, neck pain and neck stiffness.  Skin:  Positive for color change.  Neurological:  Positive for dizziness. Negative for seizures, syncope, speech difficulty, weakness, light-headedness, numbness and headaches.      Objective    BP (!) 153/65 (BP Location: Right Wrist, Patient Position: Sitting, Cuff Size: Normal)   Pulse 88   Wt 242 lb (109.8 kg)   SpO2 98%   BMI 47.26 kg/m     Physical Exam   General: Appearance:    Severely obese female in no acute distress  Eyes:    PERRL, conjunctiva/corneas clear, EOM's intact       Lungs:     Clear to auscultation bilaterally, respirations unlabored  Heart:    Normal heart rate. Normal rhythm. No murmurs, rubs, or gallops.    MS:   All extremities are intact.  Tender left para lumbar muscle with radiation to left leg on palpation.   Ext:   3+ bilateral pedal edema, worse on left. No erythema.      Assessment & Plan     1. Essential hypertension Off of [place] apparently due to cost, but has resumed chlorthalidone and seems to be tolerating fairly well. refill- chlorthalidone (HYGROTON) 25 MG tablet; Take 0.5 tablets (12.5 mg total) by mouth daily.  Dispense: 45 tablet; Refill: 1  2. Sciatica of left side  - cyclobenzaprine (FLEXERIL) 5 MG tablet; TAKE 1 TABLET BY MOUTH 3 (THREE) TIMES DAILY AS NEEDED FOR UP TO 10 DAYS FOR MUSCLE SPASMS.  Dispense: 30 tablet; Refill: 1   She also has previously prescribed meloxicam at home which she can take.   3. Lymphedema Multifactorial, but exacerbated by ruptured Bakers cyst after fall earlier this year. Now followed at [facility] and anticipating use of lymphatic pump.   Future  Appointments  Date Time Provider [place]  07/28/2021  1:40 PM Birdie Sons, MD [facility]  09/18/2021  2:00 PM Kris Hartmann, NP [facility] None  10/08/2021  2:00 PM [facility] HEALTH ADVISOR [facility]         The entirety of the information documented in the History of Present Illness, Review of Systems and Physical Exam were personally obtained by me. Portions of this information were initially documented by the CMA and reviewed by me for thoroughness and accuracy.     Lelon Huh, MD  [facility] [phone] (phone) [phone] (fax)  [place]

## 2021-04-08 ENCOUNTER — Telehealth (INDEPENDENT_AMBULATORY_CARE_PROVIDER_SITE_OTHER): Payer: Self-pay | Admitting: Nurse Practitioner

## 2021-04-08 NOTE — Telephone Encounter (Signed)
Called stating that they have not heard anything from the lymph pump company and wanted to know what the hold up was. I called patient to make her aware that it does take a few weeks sometimes but the company will reach out to schedule a visit to get patient fitted and show her how to use lymp pump  This message is for documentation purposes.

## 2021-04-16 DIAGNOSIS — H2513 Age-related nuclear cataract, bilateral: Secondary | ICD-10-CM | POA: Diagnosis not present

## 2021-04-20 ENCOUNTER — Other Ambulatory Visit: Payer: Self-pay | Admitting: Family Medicine

## 2021-04-20 DIAGNOSIS — E785 Hyperlipidemia, unspecified: Secondary | ICD-10-CM

## 2021-04-20 NOTE — Telephone Encounter (Signed)
Requested medication (s) are due for refill today: yes  Requested medication (s) are on the active medication list: yes  Last refill:  01/26/21/  Future visit scheduled: yes  Notes to clinic:  overdue lab work    Requested Prescriptions  Pending Prescriptions Disp Refills   ezetimibe (ZETIA) 10 MG tablet [Pharmacy Med Name: EZETIMIBE 10 MG TABLET] 90 tablet 0    Sig: TAKE 1 TABLET BY MOUTH EVERY DAY IN THE MORNING     Cardiovascular:  Antilipid - Sterol Transport Inhibitors Failed - 04/20/2021 10:38 AM      Failed - Total Cholesterol in normal range and within 360 days    Cholesterol, Total  Date Value Ref Range Status  09/15/2019 267 (H) 100 - 199 mg/dL Final          Failed - LDL in normal range and within 360 days    LDL Chol Calc (NIH)  Date Value Ref Range Status  09/15/2019 165 (H) 0 - 99 mg/dL Final          Failed - HDL in normal range and within 360 days    HDL  Date Value Ref Range Status  09/15/2019 53 >39 mg/dL Final          Failed - Triglycerides in normal range and within 360 days    Triglycerides  Date Value Ref Range Status  09/15/2019 260 (H) 0 - 149 mg/dL Final          Passed - Valid encounter within last 12 months    Recent Outpatient Visits           3 weeks ago Essential hypertension   [facility] Birdie Sons, MD   3 months ago Cellulitis of right lower extremity   [facility] Birdie Sons, MD   4 months ago Contusion of right lower extremity, initial encounter   [facility] Birdie Sons, MD   4 months ago Right leg swelling   [facility] Birdie Sons, MD   5 months ago Constipation, unspecified constipation type   [place], [facility], [place]       Future Appointments             In 3 months Fisher, Kirstie Peri, MD [facility], [place]

## 2021-04-29 ENCOUNTER — Telehealth: Payer: Self-pay | Admitting: Infectious Diseases

## 2021-04-29 NOTE — Telephone Encounter (Signed)
Hi, I saw the patient's husband today.  He was wondering if someone could call him as he has not been contacted by the lymphedema pump company yet to come out for measurements and she is continuing to have a lot of swelling.  I think he saw you 7/28. Thanks Waunita Schooner

## 2021-04-29 NOTE — Telephone Encounter (Signed)
Thanks for letting us know , we will follow up with the company

## 2021-05-09 DIAGNOSIS — I890 Lymphedema, not elsewhere classified: Secondary | ICD-10-CM | POA: Diagnosis not present

## 2021-05-09 DIAGNOSIS — Z23 Encounter for immunization: Secondary | ICD-10-CM | POA: Diagnosis not present

## 2021-05-09 DIAGNOSIS — M660 Rupture of popliteal cyst: Secondary | ICD-10-CM | POA: Diagnosis not present

## 2021-05-09 DIAGNOSIS — M5431 Sciatica, right side: Secondary | ICD-10-CM | POA: Diagnosis not present

## 2021-06-19 DIAGNOSIS — L309 Dermatitis, unspecified: Secondary | ICD-10-CM | POA: Diagnosis not present

## 2021-06-19 DIAGNOSIS — L570 Actinic keratosis: Secondary | ICD-10-CM | POA: Diagnosis not present

## 2021-06-19 DIAGNOSIS — L578 Other skin changes due to chronic exposure to nonionizing radiation: Secondary | ICD-10-CM | POA: Diagnosis not present

## 2021-06-19 DIAGNOSIS — Z85828 Personal history of other malignant neoplasm of skin: Secondary | ICD-10-CM | POA: Diagnosis not present

## 2021-06-19 DIAGNOSIS — L219 Seborrheic dermatitis, unspecified: Secondary | ICD-10-CM | POA: Diagnosis not present

## 2021-07-07 DIAGNOSIS — E039 Hypothyroidism, unspecified: Secondary | ICD-10-CM | POA: Diagnosis not present

## 2021-07-07 DIAGNOSIS — I10 Essential (primary) hypertension: Secondary | ICD-10-CM | POA: Diagnosis not present

## 2021-07-07 DIAGNOSIS — M1990 Unspecified osteoarthritis, unspecified site: Secondary | ICD-10-CM | POA: Diagnosis not present

## 2021-07-07 DIAGNOSIS — N1831 Chronic kidney disease, stage 3a: Secondary | ICD-10-CM | POA: Diagnosis not present

## 2021-07-07 DIAGNOSIS — L03115 Cellulitis of right lower limb: Secondary | ICD-10-CM | POA: Diagnosis not present

## 2021-07-07 DIAGNOSIS — R600 Localized edema: Secondary | ICD-10-CM | POA: Diagnosis not present

## 2021-07-14 DIAGNOSIS — E039 Hypothyroidism, unspecified: Secondary | ICD-10-CM | POA: Diagnosis not present

## 2021-07-14 DIAGNOSIS — E782 Mixed hyperlipidemia: Secondary | ICD-10-CM | POA: Diagnosis not present

## 2021-07-14 DIAGNOSIS — I10 Essential (primary) hypertension: Secondary | ICD-10-CM | POA: Diagnosis not present

## 2021-07-28 ENCOUNTER — Ambulatory Visit: Payer: PPO | Admitting: Family Medicine

## 2021-07-28 DIAGNOSIS — I890 Lymphedema, not elsewhere classified: Secondary | ICD-10-CM | POA: Diagnosis not present

## 2021-08-27 DIAGNOSIS — R42 Dizziness and giddiness: Secondary | ICD-10-CM | POA: Diagnosis not present

## 2021-08-27 DIAGNOSIS — I10 Essential (primary) hypertension: Secondary | ICD-10-CM | POA: Diagnosis not present

## 2021-08-27 DIAGNOSIS — N1831 Chronic kidney disease, stage 3a: Secondary | ICD-10-CM | POA: Diagnosis not present

## 2021-08-27 DIAGNOSIS — H8111 Benign paroxysmal vertigo, right ear: Secondary | ICD-10-CM | POA: Diagnosis not present

## 2021-09-01 DIAGNOSIS — H6123 Impacted cerumen, bilateral: Secondary | ICD-10-CM | POA: Diagnosis not present

## 2021-09-01 DIAGNOSIS — R42 Dizziness and giddiness: Secondary | ICD-10-CM | POA: Diagnosis not present

## 2021-09-01 DIAGNOSIS — H903 Sensorineural hearing loss, bilateral: Secondary | ICD-10-CM | POA: Diagnosis not present

## 2021-09-10 DIAGNOSIS — M79672 Pain in left foot: Secondary | ICD-10-CM | POA: Diagnosis not present

## 2021-09-10 DIAGNOSIS — R42 Dizziness and giddiness: Secondary | ICD-10-CM | POA: Diagnosis not present

## 2021-09-10 DIAGNOSIS — M79671 Pain in right foot: Secondary | ICD-10-CM | POA: Diagnosis not present

## 2021-09-10 DIAGNOSIS — I10 Essential (primary) hypertension: Secondary | ICD-10-CM | POA: Diagnosis not present

## 2021-09-10 DIAGNOSIS — Z0000 Encounter for general adult medical examination without abnormal findings: Secondary | ICD-10-CM | POA: Diagnosis not present

## 2021-09-10 DIAGNOSIS — R600 Localized edema: Secondary | ICD-10-CM | POA: Diagnosis not present

## 2021-09-10 DIAGNOSIS — N1831 Chronic kidney disease, stage 3a: Secondary | ICD-10-CM | POA: Diagnosis not present

## 2021-09-10 DIAGNOSIS — M109 Gout, unspecified: Secondary | ICD-10-CM | POA: Diagnosis not present

## 2021-09-10 DIAGNOSIS — E782 Mixed hyperlipidemia: Secondary | ICD-10-CM | POA: Diagnosis not present

## 2021-09-10 DIAGNOSIS — E039 Hypothyroidism, unspecified: Secondary | ICD-10-CM | POA: Diagnosis not present

## 2021-09-10 DIAGNOSIS — I491 Atrial premature depolarization: Secondary | ICD-10-CM | POA: Diagnosis not present

## 2021-09-12 ENCOUNTER — Other Ambulatory Visit: Payer: Self-pay | Admitting: Family Medicine

## 2021-09-12 DIAGNOSIS — I10 Essential (primary) hypertension: Secondary | ICD-10-CM

## 2021-09-17 DIAGNOSIS — M79672 Pain in left foot: Secondary | ICD-10-CM | POA: Diagnosis not present

## 2021-09-17 DIAGNOSIS — M19071 Primary osteoarthritis, right ankle and foot: Secondary | ICD-10-CM | POA: Diagnosis not present

## 2021-09-17 DIAGNOSIS — M25571 Pain in right ankle and joints of right foot: Secondary | ICD-10-CM | POA: Diagnosis not present

## 2021-09-17 DIAGNOSIS — M79671 Pain in right foot: Secondary | ICD-10-CM | POA: Diagnosis not present

## 2021-09-17 DIAGNOSIS — M25572 Pain in left ankle and joints of left foot: Secondary | ICD-10-CM | POA: Diagnosis not present

## 2021-09-17 DIAGNOSIS — I890 Lymphedema, not elsewhere classified: Secondary | ICD-10-CM | POA: Diagnosis not present

## 2021-09-17 DIAGNOSIS — M19072 Primary osteoarthritis, left ankle and foot: Secondary | ICD-10-CM | POA: Diagnosis not present

## 2021-09-17 DIAGNOSIS — Z8739 Personal history of other diseases of the musculoskeletal system and connective tissue: Secondary | ICD-10-CM | POA: Diagnosis not present

## 2021-09-17 DIAGNOSIS — I872 Venous insufficiency (chronic) (peripheral): Secondary | ICD-10-CM | POA: Diagnosis not present

## 2021-09-18 ENCOUNTER — Ambulatory Visit (INDEPENDENT_AMBULATORY_CARE_PROVIDER_SITE_OTHER): Payer: PPO | Admitting: Nurse Practitioner

## 2021-11-04 DIAGNOSIS — I10 Essential (primary) hypertension: Secondary | ICD-10-CM | POA: Diagnosis not present

## 2021-11-04 DIAGNOSIS — E782 Mixed hyperlipidemia: Secondary | ICD-10-CM | POA: Diagnosis not present

## 2021-11-04 DIAGNOSIS — E039 Hypothyroidism, unspecified: Secondary | ICD-10-CM | POA: Diagnosis not present

## 2021-11-06 ENCOUNTER — Other Ambulatory Visit: Payer: Self-pay | Admitting: Family Medicine

## 2021-11-11 DIAGNOSIS — R42 Dizziness and giddiness: Secondary | ICD-10-CM | POA: Diagnosis not present

## 2021-11-11 DIAGNOSIS — E782 Mixed hyperlipidemia: Secondary | ICD-10-CM | POA: Diagnosis not present

## 2021-11-11 DIAGNOSIS — N343 Urethral syndrome, unspecified: Secondary | ICD-10-CM | POA: Diagnosis not present

## 2021-11-11 DIAGNOSIS — I491 Atrial premature depolarization: Secondary | ICD-10-CM | POA: Diagnosis not present

## 2021-11-11 DIAGNOSIS — R600 Localized edema: Secondary | ICD-10-CM | POA: Diagnosis not present

## 2021-11-11 DIAGNOSIS — E039 Hypothyroidism, unspecified: Secondary | ICD-10-CM | POA: Diagnosis not present

## 2021-11-11 DIAGNOSIS — M109 Gout, unspecified: Secondary | ICD-10-CM | POA: Diagnosis not present

## 2021-11-11 DIAGNOSIS — I10 Essential (primary) hypertension: Secondary | ICD-10-CM | POA: Diagnosis not present

## 2021-11-11 DIAGNOSIS — N1831 Chronic kidney disease, stage 3a: Secondary | ICD-10-CM | POA: Diagnosis not present

## 2021-11-28 ENCOUNTER — Other Ambulatory Visit: Payer: Self-pay | Admitting: Family Medicine

## 2021-11-28 DIAGNOSIS — E039 Hypothyroidism, unspecified: Secondary | ICD-10-CM

## 2021-12-01 ENCOUNTER — Other Ambulatory Visit: Payer: Self-pay | Admitting: Family Medicine

## 2021-12-01 DIAGNOSIS — E039 Hypothyroidism, unspecified: Secondary | ICD-10-CM

## 2021-12-10 DIAGNOSIS — H8113 Benign paroxysmal vertigo, bilateral: Secondary | ICD-10-CM | POA: Diagnosis not present

## 2021-12-10 DIAGNOSIS — R42 Dizziness and giddiness: Secondary | ICD-10-CM | POA: Diagnosis not present

## 2021-12-10 DIAGNOSIS — L03116 Cellulitis of left lower limb: Secondary | ICD-10-CM | POA: Diagnosis not present

## 2021-12-15 DIAGNOSIS — L03116 Cellulitis of left lower limb: Secondary | ICD-10-CM | POA: Diagnosis not present

## 2021-12-15 DIAGNOSIS — R42 Dizziness and giddiness: Secondary | ICD-10-CM | POA: Diagnosis not present

## 2021-12-15 DIAGNOSIS — I890 Lymphedema, not elsewhere classified: Secondary | ICD-10-CM | POA: Diagnosis not present

## 2021-12-15 DIAGNOSIS — H8113 Benign paroxysmal vertigo, bilateral: Secondary | ICD-10-CM | POA: Diagnosis not present

## 2021-12-19 DIAGNOSIS — I890 Lymphedema, not elsewhere classified: Secondary | ICD-10-CM | POA: Diagnosis not present

## 2021-12-19 DIAGNOSIS — L03116 Cellulitis of left lower limb: Secondary | ICD-10-CM | POA: Diagnosis not present

## 2021-12-19 DIAGNOSIS — H8113 Benign paroxysmal vertigo, bilateral: Secondary | ICD-10-CM | POA: Diagnosis not present

## 2021-12-29 ENCOUNTER — Ambulatory Visit: Payer: PPO | Attending: Infectious Diseases

## 2021-12-29 DIAGNOSIS — R2681 Unsteadiness on feet: Secondary | ICD-10-CM | POA: Diagnosis not present

## 2021-12-29 DIAGNOSIS — R262 Difficulty in walking, not elsewhere classified: Secondary | ICD-10-CM | POA: Diagnosis not present

## 2021-12-29 DIAGNOSIS — R42 Dizziness and giddiness: Secondary | ICD-10-CM | POA: Insufficient documentation

## 2021-12-29 NOTE — Therapy (Signed)
[place] ?[place], [place], [address] ?Phone: [phone]   Fax:  [phone] ? ?Physical Therapy Evaluation ? ?Patient Details  ?Name: Kayla Chan ?MRN: [id number] ?Date of Birth: 18-Jul-1932 ?Referring Provider (PT): Leonel Ramsay, MD ? ? ?Encounter Date: 12/29/2021 ? ? PT End of Session - 12/30/21 1705   ? ? Visit Number 1   ? Number of Visits 25   ? Date for PT Re-Evaluation 03/23/22   ? PT Start Time 1348   ? PT Stop Time 1430   ? PT Time Calculation (min) 42 min   ? Equipment Utilized During Treatment Gait belt   ? Activity Tolerance Patient tolerated treatment well   ? Behavior During Therapy [facility] for tasks assessed/performed;Anxious   ? ?  ?  ? ?  ? ? ?Past Medical History:  ?Diagnosis Date  ? Cancer [facility])   ? BCC on nose  ? Hyperlipidemia   ? Hypertension   ? ? ?Past Surgical History:  ?Procedure Laterality Date  ? 24 hour Holter Monitor  01/2013  ? 4550 ectopic beats with 344 superventricular bigemminy events  ? Abdominal Ultrasound  05/31/2013  ? RUQ. Fatty Liver  ? APPENDECTOMY  1950  ? [place]  ? REPLACEMENT TOTAL KNEE  09/10/2011  ? Inpatient, [facility], [place]; Right  ? ? ?There were no vitals filed for this visit. ? ? ? Subjective Assessment - 12/30/21 1655   ? ? Subjective Pt spouse present with pt for eval (pt in transport chair). Pt reports she has difficulty hearing but is currently missing a hearing aid. Pt also typically wears glasses but forgot to bring them today. Pt spouse reports pt fell last April, which resulted in a burst cyst in her knee and cellulitis. Pt?s dizziness started following this event. Pt?s dizziness has occurred intermittently since. Pt thought dizziness might be due to medication, but it has not improved since changing her medications.  Dizziness is brought on with turning her head and looking down. She describes this as spinning. Pt did see ENT where maneuver was performed, but pt  reports she did not ?complete the maneuver? at the time. The spinning has since improved but has not gone away. She describes her symptoms more as a ?shaky? feeling now or feeling as if she is moving when still. Pt denies HA. Uses RW generally. No other recent falls, However, pt reports she did hit her head with a fall that occurred over a year ago. She did not seek follow-up.   ? Patient is accompained by: Family member   spouse  ? Pertinent History Pt spouse present with pt for eval (pt in transport chair). Pt reports she has difficulty hearing but is currently missing a hearing aid. Pt also typically wears glasses but forgot to bring them today. Pt spouse reports pt fell last April, which resulted in a burst cyst in her knee and cellulitis. Pt?s dizziness started following this event. Pt?s dizziness has occurred intermittently since. Pt thought dizziness might be due to medication, but it has not improved since changing her medications.  Dizziness is brought on with turning her head and looking down. She describes this as spinning. Pt did see ENT where maneuver was performed, but pt reports she did not ?complete the maneuver? at the time. The spinning has since improved but has not gone away. She describes her symptoms more as a ?shaky? feeling now or feeling as if she is moving when still.  Pt denies HA. Uses RW generally. No other recent falls, However, pt reports she did hit her head with a fall that occurred over a year ago. She did not seek follow-up.  Past medical history is significant for the following: PMH: arthropathy of pelvic region and thigh, back pain, edema (has lymphedema pump), venous thrombosis and embolism, pre-diabets, HTN, PAC (holitor monitor), OA, hypothyroidism, gout, fatigue, palpitations, stage 3b chronic kidney disease, otitis externa, cellulitis LE,   ? Limitations Walking;House hold activities   ? How long can you walk comfortably? impacted by dizziness   ? Diagnostic tests CT head  without contrast 2021: "   IMPRESSION:  Atrophy with small vessel chronic ischemic changes of deep cerebral  white matter.     Empty sella.     No acute intracranial abnormalities."   ? Patient Stated Goals get rid of dizziness   ? Currently in Pain? Other (Comment)   pt reported pain with movement/transfers during session, but did not report pain at rest  ? ?  ?  ? ?  ? ? ? ?VESTIBULAR AND BALANCE EVALUATION ? ? ?HISTORY:  ?Subjective history of current problem:   ?Dizziness since last April following a fall. See subjective for details.  ? ?Description of dizziness: (vertigo, unsteadiness, lightheadedness, falling, general unsteadiness, whoozy, swimmy-headed sensation, aural fullness): spinning, unsteady, "shaky" ? ?Frequency: intermittent ?Duration: Pt difficulty responding to questions throughout due to difficulty hearing (patient missing one of her hearing aids).  Unclear if symptoms last for seconds or hours. ? ?Symptom nature: (motion provoked, positional, spontaneous, constant, variable, intermittent): Positional ? ? ?Progression of symptoms: (better, worse, no change since onset) ?History of similar episodes: Somewhat improved since visit to ENT. ? ? ?Number of falls in past 6 months: None reported in last 6 months ? ? ? ? ?EXAMINATION ? ?POSTURE: Rounded shoulders, forward head posture, decrease in lordotic curves. ? ?SOMATOSENSORY: Deferred due to time ? ?      Sensation           Intact      Diminished         Absent  ?Light touch     ?  ?COORDINATION: ?Deferred due to time ? ?MUSCULOSKELETAL SCREEN: ?Deferred due to time ? ?Gait: ?Scanning of visual environment with gait is: Impaired. ? ? ?OCULOMOTOR / VESTIBULAR TESTING: ? ? ? ? ?Oculomotor Exam- Room Light ? Findings Comments  ?Ocular Alignment normal   ?Ocular ROM normal   ?    ?Gaze-Holding Nystagmus abnormal At 30 deg nystagmus noted B, reversed direction to side being viewed; pt reports dizziness  ?End-Gaze Nystagmus  End gaze nystagmus noted;  dizzy  ?    ?Smooth Pursuit abnormal Saccadic   ?    ?Saccades abnormal   ?    ?Left Head Impulse abnormal   ?Right Head Impulse abnormal   ?Static Acuity normal   ?    ? ? ?BPPV TESTS: ? Symptoms Duration Intensity Nystagmus  ?L Dix-Hallpike Reports mild dizziness, no spinning reported minutes mild None  ?R Dix-Hallpike 8/10 dizziness reports spinning minutes 8/10 None  ?L Head Roll deferred     ?R Head Roll Deferred     ?L Sidelying Test      ?R Sidelying Test      ? ? ?FUNCTIONAL OUTCOME MEASURES ?FOTO: 43 (goal 5) ? ?Interventions: ?While no nystagmus was noted patient did report 8 out of 10 dizziness and spinning sensation with right Dix-Hallpike.  PT provided Epley to treat right side  1 time.  Patient with significant mobility impairment and required min to mod assist +2 to complete maneuver.  Patient appeared anxious  throughout maneuver, required frequent encouragement to complete maneuver.  Will provide modified Epley in the future if further Epley is warranted. ? ?ASSESSMENT ?Clinical Impression: Pt is a pleasant 88year-old female referred for dizziness.  Exam today was limited due to time restrictions .  Patient did present with dizziness described as spinning but also shaky.  Examination suggestive of mixed peripheral and central presentation.  No nystagmus was observed during Dix-Hallpike but pt did report spinning and 8 out of 10 dizziness with right Dix-Hallpike.  PT did provide Epley to treat right side on this date.  Patient with significant mobility impairment requiring mod assist +2 to complete Epley maneuver.  Patient's duration of dizziness not fully consistent with BPPV (minutes).  However, patient with hearing impairment and possibly had trouble understanding questioning.  Further assessment to be completed next session and will provide further maneuvers if warranted .  PT did discuss with patient and spouse to seek possible referral with neurologist should dizziness not improve due to  findings on exam.  Patient spouse verbalized understanding . Pt will benefit from skilled PT services to address deficits in dizziness and balance to decrease risk for future falls increase quality of life. ? ? ?Note

## 2022-01-06 ENCOUNTER — Emergency Department: Payer: PPO

## 2022-01-06 ENCOUNTER — Ambulatory Visit: Payer: PPO

## 2022-01-06 ENCOUNTER — Emergency Department
Admission: EM | Admit: 2022-01-06 | Discharge: 2022-01-06 | Disposition: A | Discharge status: Home / Self Care | Payer: PPO | Attending: Emergency Medicine | Admitting: Emergency Medicine

## 2022-01-06 ENCOUNTER — Other Ambulatory Visit: Payer: Self-pay

## 2022-01-06 DIAGNOSIS — I129 Hypertensive chronic kidney disease with stage 1 through stage 4 chronic kidney disease, or unspecified chronic kidney disease: Secondary | ICD-10-CM | POA: Insufficient documentation

## 2022-01-06 DIAGNOSIS — J811 Chronic pulmonary edema: Secondary | ICD-10-CM | POA: Diagnosis not present

## 2022-01-06 DIAGNOSIS — J810 Acute pulmonary edema: Secondary | ICD-10-CM | POA: Insufficient documentation

## 2022-01-06 DIAGNOSIS — N189 Chronic kidney disease, unspecified: Secondary | ICD-10-CM | POA: Insufficient documentation

## 2022-01-06 DIAGNOSIS — R42 Dizziness and giddiness: Secondary | ICD-10-CM

## 2022-01-06 DIAGNOSIS — R0602 Shortness of breath: Secondary | ICD-10-CM | POA: Diagnosis not present

## 2022-01-06 DIAGNOSIS — R0609 Other forms of dyspnea: Secondary | ICD-10-CM | POA: Insufficient documentation

## 2022-01-06 DIAGNOSIS — I10 Essential (primary) hypertension: Secondary | ICD-10-CM

## 2022-01-06 LAB — BASIC METABOLIC PANEL
Anion gap: 11 (ref 5–15)
BUN: 31 mg/dL — ABNORMAL HIGH (ref 8–23)
CO2: 22 mmol/L (ref 22–32)
Calcium: 9.1 mg/dL (ref 8.9–10.3)
Chloride: 107 mmol/L (ref 98–111)
Creatinine, Ser: 1.02 mg/dL — ABNORMAL HIGH (ref 0.44–1.00)
GFR, Estimated: 53 mL/min — ABNORMAL LOW (ref >60)
Glucose, Bld: 110 mg/dL — ABNORMAL HIGH (ref 70–99)
Potassium: 3.9 mmol/L (ref 3.5–5.1)
Sodium: 140 mmol/L (ref 135–145)

## 2022-01-06 LAB — CBC
HCT: 45.5 % (ref 36.0–46.0)
Hemoglobin: 14.3 g/dL (ref 12.0–15.0)
MCH: 29.7 pg (ref 26.0–34.0)
MCHC: 31.4 g/dL (ref 30.0–36.0)
MCV: 94.4 fL (ref 80.0–100.0)
Platelets: 326 10*3/uL (ref 150–400)
RBC: 4.82 MIL/uL (ref 3.87–5.11)
RDW: 15.6 % — ABNORMAL HIGH (ref 11.5–15.5)
WBC: 9.1 10*3/uL (ref 4.0–10.5)
nRBC: 0 % (ref 0.0–0.2)

## 2022-01-06 LAB — BRAIN NATRIURETIC PEPTIDE: B Natriuretic Peptide: 62.9 pg/mL (ref 0.0–100.0)

## 2022-01-06 LAB — TROPONIN I (HIGH SENSITIVITY): Troponin I (High Sensitivity): 6 ng/L (ref <18)

## 2022-01-06 NOTE — ED Triage Notes (Addendum)
Pt arrives with c/o hypertension that started 2 days ago. Pt has hx of HTN and was taken off her BP meds about 2 months ago by PCP. Per pt, she does have episodes of dizziness when exerting herself. Pt endorses SOB. Pt denies CP.  ?

## 2022-01-06 NOTE — Discharge Instructions (Addendum)
You should restart your chlorthalidone to help with your blood pressure as well as the fluid accumulating in your lungs.  You will need to follow-up with your primary care doctor for recheck of your blood pressure as well as a cardiologist to make sure that the fluid in your lungs is improving.  You should return to the ER for reevaluation if you develop worsening difficulty breathing or pain in your chest. ?

## 2022-01-06 NOTE — ED Provider Notes (Signed)
? ?[facility] ?Provider Note ? ? ? Event Date/Time  ? First MD Initiated Contact with Patient 01/06/22 1739   ?  (approximate) ? ? ?History  ? ?Chief Complaint ?Hypertension ? ? ?HPI ? ?Kayla Chan is a 88 y.o. female with past medical history of hypertension, hyperlipidemia, and CKD who presents to the ED complaining of hypertension.  Patient reports that she feels like her blood pressure has been running high for the past couple of days, had it checked with physical therapy today and found it to be as high as 218/104.  She was referred to the ED for further evaluation, but denies any chest pain, shortness of breath, difficulty urinating, numbness, or weakness.  She does state that she has been feeling intermittently dizzy for the past month, particularly when she is leaning over or vacuuming in her house.  Additionally, she reports some mild difficulty breathing with exertion.  She denies any fevers or cough, deals with chronic swelling in her legs that is no worse than usual.  She was seen by her PCP for the dizziness a few weeks ago, was told to stop her chlorthalidone at that time.  She states the dizziness is unchanged since then. ?  ? ? ?Physical Exam  ? ?Triage Vital Signs: ?ED Triage Vitals  ?Enc Vitals Group  ?   BP 01/06/22 1512 (!) 213/98  ?   Pulse Rate 01/06/22 1512 86  ?   Resp 01/06/22 1512 (!) 24  ?   Temp 01/06/22 1512 97.9 ?F (36.6 ?C)  ?   Temp Source 01/06/22 1512 Oral  ?   SpO2 01/06/22 1512 93 %  ?   Weight 01/06/22 1513 245 lb (111.1 kg)  ?   Height 01/06/22 1513 5' (1.524 m)  ?   Head Circumference --   ?   Peak Flow --   ?   Pain Score 01/06/22 1513 0  ?   Pain Loc --   ?   Pain Edu? --   ?   Excl. in [place]? --   ? ? ?Most recent vital signs: ?Vitals:  ? 01/06/22 1512 01/06/22 1806  ?BP: (!) 213/98 (!) 197/88  ?Pulse: 86 81  ?Resp: (!) 24 (!) 24  ?Temp: 97.9 ?F (36.6 ?C)   ?SpO2: 93% 97%  ? ? ?Constitutional: Alert and oriented. ?Eyes: Conjunctivae are normal. ?Head:  Atraumatic. ?Nose: No congestion/rhinnorhea. ?Mouth/Throat: Mucous membranes are moist.  ?Cardiovascular: Normal rate, regular rhythm. Grossly normal heart sounds.  2+ radial pulses bilaterally. ?Respiratory: Normal respiratory effort.  No retractions. Lungs CTAB. ?Gastrointestinal: Soft and nontender. No distention. ?Musculoskeletal: No lower extremity tenderness, 1+ pitting edema to bilateral lower extremities with compression stockings in place. ?Neurologic:  Normal speech and language. No gross focal neurologic deficits are appreciated. ? ? ? ?ED Results / Procedures / Treatments  ? ?Labs ?(all labs ordered are listed, but only abnormal results are displayed) ?Labs Reviewed  ?BASIC METABOLIC PANEL - Abnormal; Notable for the following components:  ?    Result Value  ? Glucose, Bld 110 (*)   ? BUN 31 (*)   ? Creatinine, Ser 1.02 (*)   ? GFR, Estimated 53 (*)   ? All other components within normal limits  ?CBC - Abnormal; Notable for the following components:  ? RDW 15.6 (*)   ? All other components within normal limits  ?BRAIN NATRIURETIC PEPTIDE  ?TROPONIN I (HIGH SENSITIVITY)  ? ? ? ?EKG ? ?ED ECG REPORT ?I, Blake Divine,  the attending physician, personally viewed and interpreted this ECG. ? ? Date: 01/06/2022 ? EKG Time: 15:16 ? Rate: 88 ? Rhythm: normal sinus rhythm ? Axis: LAD ? Intervals:right bundle branch block and left anterior fascicular block ? ST&T Change: None ? ?RADIOLOGY ?Chest x-ray reviewed and interpreted by me with cardiomegaly and mild pulmonary edema noted, no infiltrates or effusions noted. ? ?PROCEDURES: ? ?Critical Care performed: No ? ?Procedures ? ? ?MEDICATIONS ORDERED IN ED: ?Medications - No data to display ? ? ?IMPRESSION / MDM / ASSESSMENT AND PLAN / ED COURSE  ?I reviewed the triage vital signs and the nursing notes. ?             ?               ? ?88 y.o. female with past medical history of hypertension, hyperlipidemia, and CKD who presents to the ED complaining of elevated  blood pressure for the past few days after her PCP stopped her chlorthalidone due to concern it could be contributing to her dizziness. ? ?Differential diagnosis includes, but is not limited to, ACS, AKI, electrolyte abnormality, stroke, hypertensive emergency, arrhythmia, pulmonary edema. ? ?Patient well-appearing and in no acute distress, vitals remarkable for elevated blood pressure but otherwise reassuring.  She is not in any respiratory distress and is maintaining O2 sats on room air.  She currently denies any difficulty breathing and states her leg swelling is no worse than usual.  EKG shows no evidence of arrhythmia or ischemia and there are no findings to suggest hypertensive emergency given BMP shows no AKI and she has no neurologic symptoms concerning for stroke.  Troponin within normal limits, but patient does have mild pulmonary edema on her chest x-ray.  Prior echocardiogram from last year was reviewed and shows a normal EF with no apparent diastolic dysfunction.  It seems she would benefit from going back on her chlorthalidone both for blood pressure control and also diuretic effect.  Given otherwise reassuring work-up, she is appropriate for discharge home with follow-up with her PCP, will also provide referral to the CHF clinic.  She was counseled to return to the ED for new worsening symptoms, patient agrees with plan. ? ?  ? ? ?FINAL CLINICAL IMPRESSION(S) / ED DIAGNOSES  ? ?Final diagnoses:  ?Primary hypertension  ?Acute pulmonary edema (HCC)  ? ? ? ?Rx / DC Orders  ? ?ED Discharge Orders   ? ?      Ordered  ?  AMB referral to CHF clinic       ? 01/06/22 1824  ? ?  ?  ? ?  ? ? ? ?Note:  This document was prepared using Dragon voice recognition software and may include unintentional dictation errors. ?  ?Blake Divine, MD ?01/06/22 1833 ? ?

## 2022-01-06 NOTE — Therapy (Signed)
[place] ?[place], [place], [address] ?Phone: [phone]   Fax:  [phone] ? ?Physical Therapy Note/Not seen ? ?Patient Details  ?Name: Kayla Chan ?MRN: [id number] ?Date of Birth: 03/31/1932 ?Referring Provider (PT): Leonel Ramsay, MD ? ? ?Encounter Date: 01/06/2022 ? ? PT End of Session - 01/06/22 1508   ? ? Visit Number 1   ? Number of Visits 25   ? Date for PT Re-Evaluation 03/23/22   ? PT Start Time 0932   ? PT Stop Time 1453   ? PT Time Calculation (min) 22 min   ? Equipment Utilized During Treatment Gait belt   ? Activity Tolerance Patient tolerated treatment well;Other (comment)   pt unable to participate in PT today due to BP  ? Behavior During Therapy [facility] for tasks assessed/performed;Anxious   ? ?  ?  ? ?  ? ? ?Past Medical History:  ?Diagnosis Date  ? Cancer [facility])   ? BCC on nose  ? Hyperlipidemia   ? Hypertension   ? ? ?Past Surgical History:  ?Procedure Laterality Date  ? 24 hour Holter Monitor  01/2013  ? 4550 ectopic beats with 344 superventricular bigemminy events  ? Abdominal Ultrasound  05/31/2013  ? RUQ. Fatty Liver  ? APPENDECTOMY  1950  ? [place]  ? REPLACEMENT TOTAL KNEE  09/10/2011  ? Inpatient, [facility], [place]; Right  ? ? ?There were no vitals filed for this visit. ? ? Subjective Assessment - 01/06/22 1435   ? ? Subjective Pt still dizzy at times feels dizziness has improved "a little bit." Pt with gout flare and taking medication for it. She reports her blood pressure has been high. She reports she has not taken a blood pressure pill. She thinks her dizziness does worsen with her BP. Denies headache, nausea.   ? Patient is accompained by: Family member   spouse  ? Pertinent History Pt spouse present with pt for eval (pt in transport chair). Pt reports she has difficulty hearing but is currently missing a hearing aid. Pt also typically wears glasses but forgot to bring them today. Pt spouse  reports pt fell last April, which resulted in a burst cyst in her knee and cellulitis. Pt?s dizziness started following this event. Pt?s dizziness has occurred intermittently since. Pt thought dizziness might be due to medication, but it has not improved since changing her medications.  Dizziness is brought on with turning her head and looking down. She describes this as spinning. Pt did see ENT where maneuver was performed, but pt reports she did not ?complete the maneuver? at the time. The spinning has since improved but has not gone away. She describes her symptoms more as a ?shaky? feeling now or feeling as if she is moving when still. Pt denies HA. Uses RW generally. No other recent falls, However, pt reports she did hit her head with a fall that occurred over a year ago. She did not seek follow-up.  Past medical history is significant for the following: PMH: arthropathy of pelvic region and thigh, back pain, edema (has lymphedema pump), venous thrombosis and embolism, pre-diabets, HTN, PAC (holitor monitor), OA, hypothyroidism, gout, fatigue, palpitations, stage 3b chronic kidney disease, otitis externa, cellulitis LE,   ? Limitations Walking;House hold activities   ? How long can you walk comfortably? impacted by dizziness   ? Diagnostic tests CT head without contrast 2021: "   IMPRESSION:  Atrophy with small vessel chronic ischemic  changes of deep cerebral  white matter.     Empty sella.     No acute intracranial abnormalities."   ? Patient Stated Goals get rid of dizziness   ? Currently in Pain? Other (Comment)   bilat UE tenderness when attempting to take vitals  ? ?  ?  ? ?  ? ? ?NO CHARGE ? ?No interventions provided today as pt BP too high to participate in PT (see below) ? ?Forearm pressure required as pt with high pain sensitivity when attempting to take on upper arm B. ? ?Seated: ?Forearm pressure - ?L forearm 204/93 mmHg HR 81 bpm ? ?After a couple minutes of rest, pt still seated- ?L forearm  218/104 mmHg HR 66 bpm ? ?PT instructed pt and her spouse in reason for pt needing to go to ED. They verbalize understanding and are agreeable to plan. PT escorted pt and her spouse to ED with pt in transport chair. ? ? PT Education - 01/06/22 1507   ? ? Education Details reason for not providing interventions today due to BP, reason to go to ED   ? Person(s) Educated Patient;Spouse   ? Methods Explanation   ? Comprehension Verbalized understanding   ? ?  ?  ? ?  ? ? ? PT Short Term Goals - 12/30/21 1726   ? ?  ? PT SHORT TERM GOAL #1  ? Title Pt will be independent with HEP in order to improve strength and balance in order to decrease fall risk and improve function at home.   ? Baseline 5/8: To be initiated   ? Time 6   ? Period Weeks   ? Status New   ? Target Date 02/09/22   ? ?  ?  ? ?  ? ? ? ? PT Long Term Goals - 12/30/21 1727   ? ?  ? PT LONG TERM GOAL #1  ? Title Patient will increase FOTO score to equal to or greater than  49 to demonstrate statistically significant improvement in mobility and quality of life.   ? Baseline 5/8: 39   ? Time 12   ? Period Weeks   ? Status New   ? Target Date 03/23/22   ?  ? PT LONG TERM GOAL #2  ? Title Pt will decrease DHI score by at least 18 points in order to demonstrate clinically significant reduction in disability   ? Baseline 5/8: to be initiated   ? Time 12   ? Period Weeks   ? Status New   ? Target Date 03/23/22   ?  ? PT LONG TERM GOAL #3  ? Title Pt will improve DGI by at least 3 points in order to demonstrate clinically significant improvement in balance and decreased risk for falls.   ? Baseline 5/8: to be initiated   ? Time 12   ? Period Weeks   ? Status New   ? Target Date 03/23/22   ? ?  ?  ? ?  ? ? ? ? ? ? ? ? Plan - 01/06/22 1506   ? ? Clinical Impression Statement No charge for visit/no interventions provided due to pt high BP (see note for details). PT escorted pt and her spouse to ED with pt in transport chair.   ? Personal Factors and Comorbidities  Age;Comorbidity 3+;Sex;Time since onset of injury/illness/exacerbation   ? Comorbidities arthropathy of pelvic region and thigh, back pain, edema (has lymphedema pump), venous thrombosis and embolism, pre-diabets, HTN, PAC (  holitor monitor), OA, hypothyroidism, gout, fatigue, palpitations, stage 3b chronic kidney disease, otitis externa, cellulitis LE   ? Examination-Activity Limitations Bed Mobility;Locomotion Level;Bend;Stairs   ? Examination-Participation Restrictions Community Activity;Meal Prep;Laundry;Cleaning;Shop;Yard Work   ? Stability/Clinical Decision Making Evolving/Moderate complexity   ? Rehab Potential Good   ? PT Frequency 2x / week   ? PT Duration 12 weeks   ? PT Treatment/Interventions ADLs/Self Care Home Management;Canalith Repostioning;Cryotherapy;Electrical Stimulation;Moist Heat;Traction;Ultrasound;DME Instruction;Gait training;Fluidtherapy;Parrafin;Contrast Bath;Functional mobility training;Therapeutic activities;Stair training;Therapeutic exercise;Balance training;Neuromuscular re-education;Patient/family education;Orthotic Fit/Training;Wheelchair mobility training;Manual techniques;Passive range of motion;Scar mobilization;Dry needling;Energy conservation;Splinting;Taping;Vestibular;Visual/perceptual remediation/compensation;Joint Manipulations   ? PT Next Visit Plan complete further assessment, provide Epley if indicated   ? PT Home Exercise Plan to be intitiated   ? Consulted and Agree with Plan of Care Patient;Family member/caregiver   ? Family Member Consulted Husband   ? ?  ?  ? ?  ? ? ?Patient will benefit from skilled therapeutic intervention in order to improve the following deficits and impairments:  Abnormal gait, Dizziness, Decreased range of motion, Pain, Decreased mobility, Difficulty walking, Postural dysfunction, Obesity ? ?Visit Diagnosis: ?Dizziness and giddiness ? ? ? ? ?Problem List ?Patient Active Problem List  ? Diagnosis Date Noted  ? Bruising   ? Baker's cyst, ruptured    ? Impaired fasting glucose   ? Lower extremity cellulitis 12/02/2020  ? Cellulitis and abscess of foot 12/01/2020  ? Bilateral impacted cerumen 01/25/2020  ? Non-recurrent acute serous otitis media of right ear 06/

## 2022-01-08 ENCOUNTER — Encounter: Payer: Self-pay | Admitting: Family

## 2022-01-08 ENCOUNTER — Ambulatory Visit: Payer: PPO | Attending: Family | Admitting: Family

## 2022-01-08 VITALS — BP 177/87 | HR 76 | Resp 16 | Ht 60.0 in | Wt 243.2 lb

## 2022-01-08 DIAGNOSIS — N189 Chronic kidney disease, unspecified: Secondary | ICD-10-CM | POA: Diagnosis not present

## 2022-01-08 DIAGNOSIS — I130 Hypertensive heart and chronic kidney disease with heart failure and stage 1 through stage 4 chronic kidney disease, or unspecified chronic kidney disease: Secondary | ICD-10-CM | POA: Diagnosis not present

## 2022-01-08 DIAGNOSIS — I779 Disorder of arteries and arterioles, unspecified: Secondary | ICD-10-CM | POA: Insufficient documentation

## 2022-01-08 DIAGNOSIS — I10 Essential (primary) hypertension: Secondary | ICD-10-CM

## 2022-01-08 DIAGNOSIS — Z79899 Other long term (current) drug therapy: Secondary | ICD-10-CM | POA: Diagnosis not present

## 2022-01-08 DIAGNOSIS — I890 Lymphedema, not elsewhere classified: Secondary | ICD-10-CM | POA: Insufficient documentation

## 2022-01-08 DIAGNOSIS — I5032 Chronic diastolic (congestive) heart failure: Secondary | ICD-10-CM | POA: Diagnosis not present

## 2022-01-08 DIAGNOSIS — E785 Hyperlipidemia, unspecified: Secondary | ICD-10-CM | POA: Diagnosis not present

## 2022-01-08 DIAGNOSIS — M109 Gout, unspecified: Secondary | ICD-10-CM | POA: Insufficient documentation

## 2022-01-08 NOTE — Patient Instructions (Addendum)
Begin weighing daily and call for an overnight weight gain of 3 pounds or more or a weekly weight gain of more than 5 pounds.   Call us in the future if you need us for anything.        

## 2022-01-08 NOTE — Progress Notes (Signed)
Patient ID: Kayla Chan, female    DOB: Jan 04, 1932, 88 y.o.   MRN: [id number]  HPI  Kayla Chan is a 88 y/o female with a history of hyperlipidemia, HTN, CKD, carotid disease, gout, lymphedema and chronic heart failure.   Echo report from 03/03/21 reviewed and showed an EF of 50% along with mild LVH  Was in the ED 01/06/22 due to HTN after her chlorthalidone was stopped. Pulmonary edema was noted on CXR. Diuretic was resumed and she was released.    She presents today for her initial visit with a chief complaint of minimal shortness of breath upon moderate exertion. She describes this as chronic in nature. She has associated dizziness, chronic difficulty sleeping and chronic lymphedema along with this. She denies any abdominal distention, palpitations, chest pain, cough, fatigue or weight gain.   Says that she's just resumed her chlorthalidone 2 days ago. Home BP today was 138/67 and she admits that she was very nervous about coming to the appointment today.   Has numerous allergies to medications. Has compression boots at home but hasn't been wearing them consistently because she was having some foot pain after she removes them.   Past Medical History:  Diagnosis Date   Cancer ([place])    BCC on nose   Carotid artery occlusion    CHF (congestive heart failure) (HCC)    Chronic kidney disease    Gout    Hyperlipidemia    Hypertension    Lymphedema    Past Surgical History:  Procedure Laterality Date   24 hour Holter Monitor  01/2013   4550 ectopic beats with 344 superventricular bigemminy events   Abdominal Ultrasound  05/31/2013   RUQ. Fatty Liver   APPENDECTOMY  1950   [place]   REPLACEMENT TOTAL KNEE  09/10/2011   Inpatient, [place], [place]; Right   Family History  Problem Relation Age of Onset   Stroke Mother        possible stroke   Heart Problems Father    Kidney cancer Sister    Bladder Cancer Brother    Social History   Tobacco Use   Smoking status:  Never   Smokeless tobacco: Never  Substance Use Topics   Alcohol use: Yes    Alcohol/week: 0.0 standard drinks    Comment: 1 glass of wine seldom   Allergies  Allergen Reactions   Lisinopril     Intense itching per patient can not tolerate    Amlodipine     dizziness   Atorvastatin     Other reaction(s): Muscle Pain   Bactrim [Sulfamethoxazole-Trimethoprim] Itching   Colestid  [Colestipol Hcl]     Itching, insomnia   Colestipol Other (See Comments)    insomnia   Crestor  [Rosuvastatin Calcium]     Other reaction(s): Muscle Pain   Doxycycline    Fluvastatin Sodium     Other reaction(s): Muscle Pain   Furosemide     Severe Cramping   Labetalol Other (See Comments)   Losartan     itching   Pravastatin Sodium     Numbness in legs   Verapamil     Shortness of breath, swelling, palpations.    Allopurinol Other (See Comments)    dizziness   Penicillins Rash    Took for pneumonia when she was 23 and mader her mouth break out   Prior to Admission medications   Medication Sig Start Date End Date Taking? Authorizing Provider  acetaminophen (TYLENOL) 325 MG tablet Take 2 tablets (  650 mg total) by mouth every 6 (six) hours as needed for mild pain (or Fever >/= 101). 12/05/20  Yes Wieting, Richard, MD  aspirin 81 MG tablet Take 81 mg by mouth daily.   Yes [provider]  chlorthalidone (HYGROTON) 25 MG tablet Take 0.5 tablets (12.5 mg total) by mouth daily. 03/25/21  Yes Birdie Sons, MD  ezetimibe (ZETIA) 10 MG tablet Take 1 tablet (10 mg total) by mouth daily. 04/21/21  Yes Birdie Sons, MD  levothyroxine (SYNTHROID) 75 MCG tablet TAKE 1 TABLET BY MOUTH EVERY DAY 01/26/21  Yes Birdie Sons, MD  allopurinol (ZYLOPRIM) 100 MG tablet Take 0.5 tablets (50 mg total) by mouth daily. Patient not taking: Reported on 01/08/2022 12/05/20   Loletha Grayer, MD  colchicine 0.6 MG tablet Take 1 tablet (0.6 mg total) by mouth daily as needed (gout). Patient not taking: Reported  on 01/08/2022 05/08/20   Birdie Sons, MD   Review of Systems  Constitutional:  Negative for appetite change and fatigue.  HENT:  Positive for hearing loss. Negative for congestion, postnasal drip and sore throat.   Eyes: Negative.   Respiratory:  Positive for shortness of breath (minimal). Negative for cough.   Cardiovascular:  Positive for leg swelling. Negative for chest pain and palpitations.  Gastrointestinal:  Negative for abdominal distention and abdominal pain.  Endocrine: Negative.   Genitourinary: Negative.   Musculoskeletal:  Negative for back pain and neck pain.  Allergic/Immunologic: Negative.   Neurological:  Positive for dizziness. Negative for light-headedness.  Hematological:  Negative for adenopathy. Does not bruise/bleed easily.  Psychiatric/Behavioral:  Positive for sleep disturbance (due to urination). Negative for dysphoric mood. The patient is nervous/anxious.    Vitals:   01/08/22 1054  BP: (!) 186/94  Pulse: 76  Resp: 16  SpO2: 98%  Weight: 243 lb 4 oz (110.3 kg)  Height: 5' (1.524 m)   Wt Readings from Last 3 Encounters:  01/08/22 243 lb 4 oz (110.3 kg)  01/06/22 245 lb (111.1 kg)  03/25/21 242 lb (109.8 kg)   Lab Results  Component Value Date   CREATININE 1.02 (H) 01/06/2022   CREATININE 1.11 (H) 12/03/2020   CREATININE 1.15 (H) 12/02/2020   Physical Exam Vitals and nursing note reviewed. Exam conducted with a chaperone present (husband).  Constitutional:      Appearance: Normal appearance.  HENT:     Head: Normocephalic and atraumatic.     Right Ear: Decreased hearing noted.     Left Ear: Decreased hearing noted.  Cardiovascular:     Rate and Rhythm: Normal rate and regular rhythm.  Pulmonary:     Effort: Pulmonary effort is normal. No respiratory distress.     Breath sounds: No wheezing or rales.  Abdominal:     General: There is no distension.     Palpations: Abdomen is soft.  Musculoskeletal:        General: No tenderness.      Cervical back: Normal range of motion and neck supple.     Right lower leg: Edema (lymphedema) present.     Left lower leg: Edema (lymphedema) present.  Skin:    General: Skin is warm and dry.  Neurological:     General: No focal deficit present.     Mental Status: She is alert and oriented to person, place, and time.  Psychiatric:        Mood and Affect: Mood normal.        Behavior: Behavior normal.  Thought Content: Thought content normal.   Assessment & Plan:  1: Chronic heart failure with preserved ejection fraction with structural changes (LVH)- - NYHA class II - euvolemic today - not weighing daily but will stop and get scales picked up today; instructed to weigh daily and call us for an overnight weight gain of > 2 pounds or a weekly weight gain of >5 pounds - not adding salt and rarely cooks with salt - drinking "some" water but is unclear of exactly how much she drinks; discussed keeping daily fluid intake to ~ 60 ounces - saw cardiology Nehemiah Massed) 03/11/21 - has allergic reaction to numerous medications including lisinopril and losartan - BNP 01/06/22 was 62.9  2: HTN- - BP elevated (186/94) but chlorthalidone was just resumed 2 days ago and she admits that she's nervous about being here - home BP reading today was 138/67; continue medications and continue checking BP at home - saw PCP Ola Spurr) 12/19/21 - BMP 01/06/22 reviewed and showed sodium 140, potassium 3.9, creatinine 1.02 & GFR 53  3: Lymphedema- - stage 2 - trying to elevate legs when sitting for long periods of time - wearing compression socks daily - has compression boots that she can wear but says that the higher compression seemed to cause left foot pain so she hasn't been wearing them much - encouraged her to decrease the compression strength and try wearing them for shorter duration of time   Medication bottles reviewed.   Due to HF stability, will not make a return appointment at this time.  Advised patient that she could call back at anytime for any questions/issues or to make another appointment and both she and her husband were comfortable with this plan.

## 2022-01-13 ENCOUNTER — Ambulatory Visit: Payer: PPO

## 2022-01-16 DIAGNOSIS — N1831 Chronic kidney disease, stage 3a: Secondary | ICD-10-CM | POA: Diagnosis not present

## 2022-01-16 DIAGNOSIS — I10 Essential (primary) hypertension: Secondary | ICD-10-CM | POA: Diagnosis not present

## 2022-01-16 DIAGNOSIS — H8113 Benign paroxysmal vertigo, bilateral: Secondary | ICD-10-CM | POA: Diagnosis not present

## 2022-01-16 DIAGNOSIS — I890 Lymphedema, not elsewhere classified: Secondary | ICD-10-CM | POA: Diagnosis not present

## 2022-01-20 ENCOUNTER — Ambulatory Visit: Payer: PPO

## 2022-01-27 ENCOUNTER — Ambulatory Visit: Payer: PPO

## 2022-01-27 DIAGNOSIS — I10 Essential (primary) hypertension: Secondary | ICD-10-CM | POA: Diagnosis not present

## 2022-01-27 DIAGNOSIS — H8113 Benign paroxysmal vertigo, bilateral: Secondary | ICD-10-CM | POA: Diagnosis not present

## 2022-01-27 DIAGNOSIS — R42 Dizziness and giddiness: Secondary | ICD-10-CM | POA: Diagnosis not present

## 2022-02-03 ENCOUNTER — Ambulatory Visit: Payer: PPO | Attending: Infectious Diseases

## 2022-02-03 DIAGNOSIS — R42 Dizziness and giddiness: Secondary | ICD-10-CM | POA: Diagnosis not present

## 2022-02-03 DIAGNOSIS — R2681 Unsteadiness on feet: Secondary | ICD-10-CM | POA: Insufficient documentation

## 2022-02-03 DIAGNOSIS — M6281 Muscle weakness (generalized): Secondary | ICD-10-CM | POA: Diagnosis not present

## 2022-02-03 DIAGNOSIS — R262 Difficulty in walking, not elsewhere classified: Secondary | ICD-10-CM | POA: Insufficient documentation
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
## 2022-02-10 ENCOUNTER — Ambulatory Visit: Payer: PPO

## 2022-02-10 DIAGNOSIS — R42 Dizziness and giddiness: Secondary | ICD-10-CM

## 2022-02-10 DIAGNOSIS — M6281 Muscle weakness (generalized): Secondary | ICD-10-CM

## 2022-02-10 DIAGNOSIS — R2681 Unsteadiness on feet: Secondary | ICD-10-CM

## 2022-02-10 DIAGNOSIS — R262 Difficulty in walking, not elsewhere classified: Secondary | ICD-10-CM

## 2022-02-10 NOTE — Therapy (Addendum)
[place] MAIN [facility] SERVICES [address] [place], [place], [address] Phone: [phone]   Fax:  [phone]  Physical Therapy Treatment  Patient Details  Name: Kayla Chan MRN: [id number] Date of Birth: 1931-11-29 Referring Provider (PT): Leonel Ramsay, MD   Encounter Date: 02/10/2022   PT End of Session - 02/10/22 1543     Visit Number 3    Number of Visits 25    Date for PT Re-Evaluation 03/23/22    PT Start Time 9833    PT Stop Time 1515    PT Time Calculation (min) 43 min    Equipment Utilized During Treatment Gait belt    Activity Tolerance Patient tolerated treatment well    Behavior During Therapy [facility] for tasks assessed/performed;Anxious             Past Medical History:  Diagnosis Date   Cancer ([place])    BCC on nose   Carotid artery occlusion    CHF (congestive heart failure) (HCC)    Chronic kidney disease    Gout    Hyperlipidemia    Hypertension    Lymphedema     Past Surgical History:  Procedure Laterality Date   24 hour Holter Monitor  01/2013   4550 ectopic beats with 344 superventricular bigemminy events   Abdominal Ultrasound  05/31/2013   RUQ. Fatty Liver   APPENDECTOMY  1950   [place]   REPLACEMENT TOTAL KNEE  09/10/2011   Inpatient, [place], [place]; Right    There were no vitals filed for this visit.   Subjective Assessment - 02/10/22 1437     Subjective Pt reports still having dizziness, took medication her physician gave her today but reports it makes her dizzy. Pt and pt spouse reports having tried about 8 different medications for her BP but pt reports makes her constantly dizzy. PT recommends discussing further with her physician, pt verbalizes understanding. Dizziness is 4-5/10 upon arrival to session.    Patient is accompained by: Family member   spouse   Pertinent History Pt spouse present with pt for eval (pt in transport chair). Pt reports she has difficulty  hearing but is currently missing a hearing aid. Pt also typically wears glasses but forgot to bring them today. Pt spouse reports pt fell last April, which resulted in a burst cyst in her knee and cellulitis. Pt's dizziness started following this event. Pt's dizziness has occurred intermittently since. Pt thought dizziness might be due to medication, but it has not improved since changing her medications.  Dizziness is brought on with turning her head and looking down. She describes this as spinning. Pt did see ENT where maneuver was performed, but pt reports she did not "complete the maneuver" at the time. The spinning has since improved but has not gone away. She describes her symptoms more as a "shaky" feeling now or feeling as if she is moving when still. Pt denies HA. Uses RW generally. No other recent falls, However, pt reports she did hit her head with a fall that occurred over a year ago. She did not seek follow-up.  Past medical history is significant for the following: PMH: arthropathy of pelvic region and thigh, back pain, edema (has lymphedema pump), venous thrombosis and embolism, pre-diabets, HTN, PAC (holitor monitor), OA, hypothyroidism, gout, fatigue, palpitations, stage 3b chronic kidney disease, otitis externa, cellulitis LE,    Limitations Walking;House hold activities    How long can you walk comfortably? impacted by dizziness  Diagnostic tests CT head without contrast 2021: "   IMPRESSION:  Atrophy with small vessel chronic ischemic changes of deep cerebral  white matter.     Empty sella.     No acute intracranial abnormalities."    Patient Stated Goals get rid of dizziness             INTERVENTIONS   Seated BP forearm LUE:  202/108 mmHg HR 81 bpm. No symptoms reported.   Extensive education and instruction regarding technique and purpose of interventions provided.  Side lying test:  abnormal B, exhibits pure downbeat nystagmus, unclear if delayed onset due to pt hearing  and communication difficulties. Nystagmus and reports of dizziness lasted minutes B. Pt reports more intense dizziness on R side than L side. S Since pt reported improvement following last maneuver PT & SPT provided Semont maneuver once 1x to treat R side. Pt declined further maneuvers. Pt required frequent VC/TC throughout to maintain correct technique with maneuver (pt reported anxiety & tried to come out of position possibly affecting efficacy), mod A x 2 to complete testing and maneuver  Provided education on seeking appt with PCP for further workup and to seek referral to neurologist due to central indicators, pt's spouse verbalized understanding.  Pt educated throughout session about proper posture and technique with exercises. Improved exercise technique, movement at target joints, use of target muscles after min to mod verbal, visual, tactile cues.  Rationale for Evaluation and Treatment Rehabilitation  Izola Price, SPT   This entire session was performed under direct supervision and direction of a licensed therapist/therapist assistant . I have personally read, edited and approve of the note as written. Ricard Dillon PT, DPT     PT Education - 02/10/22 1542     Education Details testing and maneuver positioning    Person(s) Educated Patient    Methods Explanation;Demonstration;Tactile cues;Verbal cues    Comprehension Verbalized understanding;Returned demonstration;Verbal cues required;Tactile cues required;Need further instruction              PT Short Term Goals - 12/30/21 1726       PT SHORT TERM GOAL #1   Title Pt will be independent with HEP in order to improve strength and balance in order to decrease fall risk and improve function at home.    Baseline 5/8: To be initiated    Time 6    Period Weeks    Status New    Target Date 02/09/22               PT Long Term Goals - 12/30/21 1727       PT LONG TERM GOAL #1   Title Patient will increase FOTO  score to equal to or greater than  49 to demonstrate statistically significant improvement in mobility and quality of life.    Baseline 5/8: 39    Time 12    Period Weeks    Status New    Target Date 03/23/22      PT LONG TERM GOAL #2   Title Pt will decrease DHI score by at least 18 points in order to demonstrate clinically significant reduction in disability    Baseline 5/8: to be initiated    Time 12    Period Weeks    Status New    Target Date 03/23/22      PT LONG TERM GOAL #3   Title Pt will improve DGI by at least 3 points in order to demonstrate clinically significant improvement in  balance and decreased risk for falls.    Baseline 5/8: to be initiated    Time 12    Period Weeks    Status New    Target Date 03/23/22                   Plan - 02/10/22 1537     Clinical Impression Statement Performed inner ear testing. Pt with dizziness with B side lying tests. However, pt with pure downbeat nystagmus B, suggesting central origin of vertigo. PT and SPT did provided semont maneuver for R side because pt had previous reports of improvement following eply maneuver. Instruct pt seek further follow up with physician (see note for details). The pt will benefit from further skilled PT to decrease dizziness symptoms and improve QoL.    Personal Factors and Comorbidities Age;Comorbidity 3+;Sex;Time since onset of injury/illness/exacerbation    Comorbidities arthropathy of pelvic region and thigh, back pain, edema (has lymphedema pump), venous thrombosis and embolism, pre-diabets, HTN, PAC (holitor monitor), OA, hypothyroidism, gout, fatigue, palpitations, stage 3b chronic kidney disease, otitis externa, cellulitis LE    Examination-Activity Limitations Bed Mobility;Locomotion Level;Bend;Stairs    Examination-Participation Restrictions Community Activity;Meal Prep;Laundry;Cleaning;Shop;Yard Work    [employer]chant [employer] officer Evolving/Moderate complexity    Rehab Potential  Good    PT Frequency 2x / week    PT Duration 12 weeks    PT Treatment/Interventions ADLs/Self Care Home Management;Canalith Repostioning;Cryotherapy;Electrical Stimulation;Moist Heat;Traction;Ultrasound;DME Instruction;Gait training;Fluidtherapy;Parrafin;Contrast Bath;Functional mobility training;Therapeutic activities;Stair training;Therapeutic exercise;Balance training;Neuromuscular re-education;Patient/family education;Orthotic Fit/Training;Wheelchair mobility training;Manual techniques;Passive range of motion;Scar mobilization;Dry needling;Energy conservation;Splinting;Taping;Vestibular;Visual/perceptual remediation/compensation;Joint Manipulations    PT Next Visit Plan complete further assessment, balance    PT Home Exercise Plan Access Code: [email or web address]    Consulted and Agree with Plan of Care Patient;Family member/caregiver    Family Member Consulted Husband             Patient will benefit from skilled therapeutic intervention in order to improve the following deficits and impairments:  Abnormal gait, Dizziness, Decreased range of motion, Pain, Decreased mobility, Difficulty walking, Postural dysfunction, Obesity  Visit Diagnosis: Dizziness and giddiness  Unsteadiness on feet  Muscle weakness (generalized)  Difficulty in walking, not elsewhere classified     Problem List Patient Active Problem List   Diagnosis Date Noted   Bruising    Baker's cyst, ruptured    Impaired fasting glucose    Lower extremity cellulitis 12/02/2020   Cellulitis and abscess of foot 12/01/2020   Bilateral impacted cerumen 01/25/2020   Non-recurrent acute serous otitis media of right ear 01/25/2020   Otitis externa 01/25/2020   Hearing aid worn 01/25/2020   At risk for polypharmacy 01/10/2020   Dizzinesses 01/10/2020   Decreased GFR 01/10/2020   Obesity, Class III, BMI 40-49.9 (morbid obesity) ([place]) 11/23/2018   Stage 3b chronic kidney disease ([place]) 11/09/2018   Limited mobility 09/03/2017    Fatigue 02/04/2016   Palpitations 02/04/2016   Gout 09/24/2015   Hyperuricemia 08/20/2015   Back pain 06/28/2015   Constipation 06/28/2015   Eczema 06/28/2015   Edema 06/28/2015   Pre-diabetes 06/28/2015   Intertrigo 06/28/2015   PAC (premature atrial contraction) 06/28/2015   Premature heartbeats 06/28/2015   History of basal cell cancer 02/18/2015   Fatty infiltration of liver 05/31/2013   Hypothyroidism 01/15/2011   Arthropathy of pelvic region and thigh 08/01/2008   HLD (hyperlipidemia) 03/13/2008   Arthritis, degenerative 01/26/2007   Essential hypertension 12/13/2006   Personal history of venous thrombosis and embolism 05/24/2006    Izola Price,  Student-PT 02/10/2022, 3:48 PM  [place] MAIN [facility] SERVICES [address] [place], [place], [address] Phone: [phone]   Fax:  [phone]  Name: Kaida Games MRN: [id number] Date of Birth: 25-Jan-1932

## 2022-02-17 ENCOUNTER — Ambulatory Visit: Payer: PPO

## 2022-02-17 DIAGNOSIS — R42 Dizziness and giddiness: Secondary | ICD-10-CM

## 2022-02-17 DIAGNOSIS — M6281 Muscle weakness (generalized): Secondary | ICD-10-CM

## 2022-02-17 DIAGNOSIS — R262 Difficulty in walking, not elsewhere classified: Secondary | ICD-10-CM

## 2022-02-17 DIAGNOSIS — R2681 Unsteadiness on feet: Secondary | ICD-10-CM

## 2022-02-25 DIAGNOSIS — H8113 Benign paroxysmal vertigo, bilateral: Secondary | ICD-10-CM | POA: Diagnosis not present

## 2022-02-25 DIAGNOSIS — N1831 Chronic kidney disease, stage 3a: Secondary | ICD-10-CM | POA: Diagnosis not present

## 2022-02-25 DIAGNOSIS — R42 Dizziness and giddiness: Secondary | ICD-10-CM | POA: Diagnosis not present

## 2022-02-25 DIAGNOSIS — E538 Deficiency of other specified B group vitamins: Secondary | ICD-10-CM | POA: Diagnosis not present

## 2022-02-25 DIAGNOSIS — I10 Essential (primary) hypertension: Secondary | ICD-10-CM | POA: Diagnosis not present

## 2022-02-25 DIAGNOSIS — M109 Gout, unspecified: Secondary | ICD-10-CM | POA: Diagnosis not present

## 2022-02-25 DIAGNOSIS — R600 Localized edema: Secondary | ICD-10-CM | POA: Diagnosis not present

## 2022-02-26 ENCOUNTER — Ambulatory Visit: Payer: PPO

## 2022-03-02 DIAGNOSIS — E538 Deficiency of other specified B group vitamins: Secondary | ICD-10-CM | POA: Diagnosis not present

## 2022-03-03 ENCOUNTER — Ambulatory Visit: Payer: PPO | Attending: Infectious Diseases

## 2022-03-03 DIAGNOSIS — R42 Dizziness and giddiness: Secondary | ICD-10-CM | POA: Diagnosis not present

## 2022-03-03 DIAGNOSIS — R2681 Unsteadiness on feet: Secondary | ICD-10-CM | POA: Diagnosis not present

## 2022-03-03 DIAGNOSIS — R262 Difficulty in walking, not elsewhere classified: Secondary | ICD-10-CM | POA: Diagnosis not present

## 2022-03-03 DIAGNOSIS — M6281 Muscle weakness (generalized): Secondary | ICD-10-CM | POA: Diagnosis not present

## 2022-03-03 NOTE — Therapy (Signed)
OUTPATIENT PHYSICAL THERAPY TREATMENT NOTE   Patient Name: Kayla Chan MRN: [id number] DOB:[birth year], 88 y.o., female Today's Date: 03/03/2022  PCP: Leonel Ramsay, MD REFERRING PROVIDER: Leonel Ramsay, MD   PT End of Session - 03/03/22 1436     Visit Number 5    Number of Visits 25    Date for PT Re-Evaluation 03/23/22    PT Start Time 1436    PT Stop Time 1515    PT Time Calculation (min) 39 min    Equipment Utilized During Treatment Gait belt    Activity Tolerance Patient tolerated treatment well    Behavior During Therapy [facility] for tasks assessed/performed;Anxious              Past Medical History:  Diagnosis Date   Cancer ([place])    BCC on nose   Carotid artery occlusion    CHF (congestive heart failure) (HCC)    Chronic kidney disease    Gout    Hyperlipidemia    Hypertension    Lymphedema    Past Surgical History:  Procedure Laterality Date   24 hour Holter Monitor  01/2013   4550 ectopic beats with 344 superventricular bigemminy events   Abdominal Ultrasound  05/31/2013   RUQ. Fatty Liver   APPENDECTOMY  1950   [place]   REPLACEMENT TOTAL KNEE  09/10/2011   Inpatient, [place], [place]; Right   Patient Active Problem List   Diagnosis Date Noted   Bruising    Baker's cyst, ruptured    Impaired fasting glucose    Lower extremity cellulitis 12/02/2020   Cellulitis and abscess of foot 12/01/2020   Bilateral impacted cerumen 01/25/2020   Non-recurrent acute serous otitis media of right ear 01/25/2020   Otitis externa 01/25/2020   Hearing aid worn 01/25/2020   At risk for polypharmacy 01/10/2020   Dizzinesses 01/10/2020   Decreased GFR 01/10/2020   Obesity, Class III, BMI 40-49.9 (morbid obesity) ([place]) 11/23/2018   Stage 3b chronic kidney disease ([place]) 11/09/2018   Limited mobility 09/03/2017   Fatigue 02/04/2016   Palpitations 02/04/2016   Gout 09/24/2015   Hyperuricemia 08/20/2015   Back pain 06/28/2015   Constipation  06/28/2015   Eczema 06/28/2015   Edema 06/28/2015   Pre-diabetes 06/28/2015   Intertrigo 06/28/2015   PAC (premature atrial contraction) 06/28/2015   Premature heartbeats 06/28/2015   History of basal cell cancer 02/18/2015   Fatty infiltration of liver 05/31/2013   Hypothyroidism 01/15/2011   Arthropathy of pelvic region and thigh 08/01/2008   HLD (hyperlipidemia) 03/13/2008   Arthritis, degenerative 01/26/2007   Essential hypertension 12/13/2006   Personal history of venous thrombosis and embolism 05/24/2006    REFERRING DIAG: Dizziness and giddiness  THERAPY DIAG:  Dizziness and giddiness  Unsteadiness on feet  Muscle weakness (generalized)  Difficulty in walking, not elsewhere classified  Rationale for Evaluation and Treatment Rehabilitation  PERTINENT HISTORY: Pt spouse present with pt for eval (pt in transport chair). Pt reports she has difficulty hearing but is currently missing a hearing aid. Pt also typically wears glasses but forgot to bring them today. Pt spouse reports pt fell last April, which resulted in a burst cyst in her knee and cellulitis. Pt's dizziness started following this event. Pt's dizziness has occurred intermittently since. Pt thought dizziness might be due to medication, but it has not improved since changing her medications.  Dizziness is brought on with turning her head and looking down. She describes this as spinning. Pt did see ENT where  maneuver was performed, but pt reports she did not "complete the maneuver" at the time. The spinning has since improved but has not gone away. She describes her symptoms more as a "shaky" feeling now or feeling as if she is moving when still. Pt denies HA. Uses RW generally. No other recent falls, However, pt reports she did hit her head with a fall that occurred over a year ago. She did not seek follow-up.  Past medical history is significant for the following: PMH: arthropathy of pelvic region and thigh, back pain,  edema (has lymphedema pump), venous thrombosis and embolism, pre-diabets, HTN, PAC (holitor monitor), OA, hypothyroidism, gout, fatigue, palpitations, stage 3b chronic kidney disease, otitis externa, cellulitis LE.  PRECAUTIONS: monitor BP, fall  SUBJECTIVE: Pt reports she got an shot yesterday for low B12, pt's spouse reports pt will be getting a shot every Monday for a month, then will switch over to pills. Pt's spouse reports pt was found to have low B12 & iron. Pt currently reports little dizziness but pt's spouse reports it has been better overall recently. Rates as 4/10 at start of session. Pt states she had a recent gout flare up over the weekend and L ankle is still sore.   PAIN:  Are you having pain? No     TODAY'S TREATMENT:   Seated BP forearm LUE:  186/92 mmHg HR 80 bpm. Pt reports no symptoms   Seated VORx1: 2 x multiple reps each direction, horizontal & vertical head turns, plain background, seated, pt reports slight increase in dizziness, recovery interval taken and pt reports feeling better  Seated straight leg hip abd: 3 x 20, RTB around ankles  Gait belt donned and CGA provided unless otherwise specified  Standing, on static surface: - NBOS: 60 sec - NBOS with writing on whiteboard: 3 minutes    Standing, on airex:  - WBOS: 60 sec - NBOS: 60 sec - NBOS with drawing on whiteboard: 2 minutes  VOR cancellation: seated, using hedgehog as target,  plain background, 2 x multiple reps for each direction, horizontal & vertical head turns  Increased time taken with each intervention to instruct pt on exercise and provided education on technique and purpose of exercise    PATIENT EDUCATION: Education details: exercise technique, body mechanics Person educated: Patient Education method: Explanation, Demonstration, Tactile cues, and Verbal cues Education comprehension: verbalized understanding, returned demonstration, verbal cues required, tactile cues required, and  needs further education   HOME EXERCISE PROGRAM: No updates as of 03/03/22  Access Code: [email or web address]   PT Short Term Goals      PT SHORT TERM GOAL #1   Title Pt will be independent with HEP in order to improve strength and balance in order to decrease fall risk and improve function at home.    Baseline 5/8: To be initiated    Time 6    Period Weeks    Status New    Target Date 02/09/22              PT Long Term Goals       PT LONG TERM GOAL #1   Title Patient will increase FOTO score to equal to or greater than  49 to demonstrate statistically significant improvement in mobility and quality of life.    Baseline 5/8: 39    Time 12    Period Weeks    Status New    Target Date 03/23/22      PT LONG TERM GOAL #2  Title Pt will decrease DHI score by at least 18 points in order to demonstrate clinically significant reduction in disability    Baseline 5/8: to be initiated    Time 12    Period Weeks    Status New    Target Date 03/23/22      PT LONG TERM GOAL #3   Title Pt will improve DGI by at least 3 points in order to demonstrate clinically significant improvement in balance and decreased risk for falls.    Baseline 5/8: to be initiated    Time 12    Period Weeks    Status New    Target Date 03/23/22              Plan    Clinical Impression Statement Pt presented today with reports of recent gout flare up in LLE, resulting in maintaining in transport chair for most interventions in order to avoid increasing discomfort. Pt demonstrates improvement in symptoms with only slight increase in dizziness throughout session with pt reporting "feeling better" following. Noted improvement in pt's ability to maintain eyes on target during VORx1 with only a few corrections. Pt still requires cues for exercise techniques, however, improved carryover noted today. Pt tolerated standing balance well with no reports of major increase in pain and only mild sway noted. The pt will  benefit from further skilled PT to decrease dizziness symptoms and improve QoL.    Personal Factors and Comorbidities Age;Comorbidity 3+;Sex;Time since onset of injury/illness/exacerbation    Comorbidities arthropathy of pelvic region and thigh, back pain, edema (has lymphedema pump), venous thrombosis and embolism, pre-diabets, HTN, PAC (holitor monitor), OA, hypothyroidism, gout, fatigue, palpitations, stage 3b chronic kidney disease, otitis externa, cellulitis LE    Examination-Activity Limitations Bed Mobility;Locomotion Level;Bend;Stairs    Examination-Participation Restrictions Community Activity;Meal Prep;Laundry;Cleaning;Shop;Yard Work    [employer]chant [employer] officer Evolving/Moderate complexity    Rehab Potential Good    PT Frequency 2x / week    PT Duration 12 weeks    PT Treatment/Interventions ADLs/Self Care Home Management;Canalith Repostioning;Cryotherapy;Electrical Stimulation;Moist Heat;Traction;Ultrasound;DME Instruction;Gait training;Fluidtherapy;Parrafin;Contrast Bath;Functional mobility training;Therapeutic activities;Stair training;Therapeutic exercise;Balance training;Neuromuscular re-education;Patient/family education;Orthotic Fit/Training;Wheelchair mobility training;Manual techniques;Passive range of motion;Scar mobilization;Dry needling;Energy conservation;Splinting;Taping;Vestibular;Visual/perceptual remediation/compensation;Joint Manipulations    PT Next Visit Plan balance and gait assessment and interventions, continue POC   PT Home Exercise Plan Access Code: [email or web address]; no updates    Consulted and Agree with Plan of Care Patient;Family member/caregiver    Family Member Consulted Husband             Izola Price, [place]  This entire session was performed under direct supervision and direction of a licensed therapist . I have personally read, edited and approve of the note as written. Ricard Dillon PT, DPT   Zollie Pee, PT 03/03/2022, 3:45 PM

## 2022-03-09 DIAGNOSIS — E538 Deficiency of other specified B group vitamins: Secondary | ICD-10-CM | POA: Diagnosis not present

## 2022-03-10 ENCOUNTER — Ambulatory Visit: Payer: PPO

## 2022-03-16 DIAGNOSIS — E538 Deficiency of other specified B group vitamins: Secondary | ICD-10-CM | POA: Diagnosis not present

## 2022-03-17 ENCOUNTER — Ambulatory Visit: Payer: PPO

## 2022-03-23 DIAGNOSIS — E538 Deficiency of other specified B group vitamins: Secondary | ICD-10-CM | POA: Diagnosis not present

## 2022-03-24 ENCOUNTER — Ambulatory Visit: Payer: PPO | Attending: Infectious Diseases

## 2022-03-24 DIAGNOSIS — R42 Dizziness and giddiness: Secondary | ICD-10-CM | POA: Diagnosis not present

## 2022-03-24 DIAGNOSIS — M6281 Muscle weakness (generalized): Secondary | ICD-10-CM | POA: Diagnosis not present

## 2022-03-24 DIAGNOSIS — R2681 Unsteadiness on feet: Secondary | ICD-10-CM | POA: Insufficient documentation

## 2022-03-24 NOTE — Therapy (Signed)
OUTPATIENT PHYSICAL THERAPY TREATMENT NOTE/RECERT   Patient Name: Kayla Chan MRN: [id number] DOB:May 14, 1932, 88 y.o., female Today's Date: 03/24/2022  PCP: Leonel Ramsay, MD REFERRING PROVIDER: Leonel Ramsay, MD   PT End of Session - 03/24/22 1647     Visit Number 6    Number of Visits 22    Date for PT Re-Evaluation 05/19/22    PT Start Time 6759    PT Stop Time 1514    PT Time Calculation (min) 41 min    Equipment Utilized During Treatment Gait belt    Activity Tolerance Patient tolerated treatment well    Behavior During Therapy [facility] for tasks assessed/performed               Past Medical History:  Diagnosis Date   Cancer ([place])    [place] on nose   Carotid artery occlusion    CHF (congestive heart failure) ([place])    Chronic kidney disease    Gout    Hyperlipidemia    Hypertension    Lymphedema    Past Surgical History:  Procedure Laterality Date   24 hour Holter Monitor  01/2013   4550 ectopic beats with 344 superventricular bigemminy events   Abdominal Ultrasound  05/31/2013   RUQ. Fatty Liver   APPENDECTOMY  1950   [place]   REPLACEMENT TOTAL KNEE  09/10/2011   Inpatient, [place], [place]; Right   Patient Active Problem List   Diagnosis Date Noted   Bruising    Baker's cyst, ruptured    Impaired fasting glucose    Lower extremity cellulitis 12/02/2020   Cellulitis and abscess of foot 12/01/2020   Bilateral impacted cerumen 01/25/2020   Non-recurrent acute serous otitis media of right ear 01/25/2020   Otitis externa 01/25/2020   Hearing aid worn 01/25/2020   At risk for polypharmacy 01/10/2020   Dizzinesses 01/10/2020   Decreased GFR 01/10/2020   Obesity, Class III, BMI 40-49.9 (morbid obesity) ([place]) 11/23/2018   Stage 3b chronic kidney disease ([place]) 11/09/2018   Limited mobility 09/03/2017   Fatigue 02/04/2016   Palpitations 02/04/2016   Gout 09/24/2015   Hyperuricemia 08/20/2015   Back pain 06/28/2015   Constipation  06/28/2015   Eczema 06/28/2015   Edema 06/28/2015   Pre-diabetes 06/28/2015   Intertrigo 06/28/2015   PAC (premature atrial contraction) 06/28/2015   Premature heartbeats 06/28/2015   History of basal cell cancer 02/18/2015   Fatty infiltration of liver 05/31/2013   Hypothyroidism 01/15/2011   Arthropathy of pelvic region and thigh 08/01/2008   HLD (hyperlipidemia) 03/13/2008   Arthritis, degenerative 01/26/2007   Essential hypertension 12/13/2006   Personal history of venous thrombosis and embolism 05/24/2006    REFERRING DIAG: Dizziness and giddiness  THERAPY DIAG:  Dizziness and giddiness  Unsteadiness on feet  Muscle weakness (generalized)  Rationale for Evaluation and Treatment Rehabilitation  PERTINENT HISTORY: Pt spouse present with pt for eval (pt in transport chair). Pt reports she has difficulty hearing but is currently missing a hearing aid. Pt also typically wears glasses but forgot to bring them today. Pt spouse reports pt fell last April, which resulted in a burst cyst in her knee and cellulitis. Pt's dizziness started following this event. Pt's dizziness has occurred intermittently since. Pt thought dizziness might be due to medication, but it has not improved since changing her medications.  Dizziness is brought on with turning her head and looking down. She describes this as spinning. Pt did see ENT where maneuver was performed, but pt reports  she did not "complete the maneuver" at the time. The spinning has since improved but has not gone away. She describes her symptoms more as a "shaky" feeling now or feeling as if she is moving when still. Pt denies HA. Uses RW generally. No other recent falls, However, pt reports she did hit her head with a fall that occurred over a year ago. She did not seek follow-up.  Past medical history is significant for the following: PMH: arthropathy of pelvic region and thigh, back pain, edema (has lymphedema pump), venous thrombosis and  embolism, pre-diabets, HTN, PAC (holitor monitor), OA, hypothyroidism, gout, fatigue, palpitations, stage 3b chronic kidney disease, otitis externa, cellulitis LE.  PRECAUTIONS: monitor BP, fall  SUBJECTIVE: Pt is thinking she is going to get an MRI for her neck. Pt spouse reports pt was sitting there and suddenly got really dizzy. She says it comes over her "real bad out of nowhere, " denies it is spinning but thinks it may be like a lightheaded sensation. Would like to wait to come back to PT after seeing her dr for a follow-up. Finished her last B12 injection yesterday. Feels PT has helped a little but she still gets very dizzy. BP 146/77 mmHg at home per spouse. Pt not consistent in doing her HEP.    PAIN:  Are you having pain? No     TODAY'S TREATMENT:   Pt would like to take break from PT until she can see her physician. She reports she wants to pursue MRI.  Reviewed goals/outcome measures: FOTO: 48 DHI: 40   Updated and reviewed HEP in session to determine pt performs with correct technique and safety precautions. Instructed pt to perform all balance interventions at a support surface, and to have chair in front of her with corner balance exercise in corner.   Pt performed 1 set of each of the following in session. Access Code: [email or web address] URL: [email or web address] Date: 03/24/2022 Prepared by: Ricard Dillon  Exercises - Corner Balance Feet Apart: Eyes Open With Head Turns  - 1 x daily - 5 x weekly - 3 sets - 10 reps - Sit to Stand with Counter Support  - 1 x daily - 5 x weekly - 3 sets - 10 reps - Standing Tandem Balance with Counter Support  - 1 x daily - 7 x weekly - 2 sets - 2 reps - 30 hold - Romberg Stance  - 1 x daily - 7 x weekly - 2 sets - 2 reps - 30 hold  Additionally instructed pt to continue other HEP at home. Emphasized importance of VORx1 exercise in order to see improvement in symptoms. Pt had reported she had not been consistent with this part of  her HEP  PATIENT EDUCATION: Education details: exercise technique, body mechanics Person educated: Patient Education method: Explanation, Demonstration, Tactile cues, and Verbal cues Education comprehension: verbalized understanding, returned demonstration, verbal cues required, tactile cues required, and needs further education   HOME EXERCISE PROGRAM: 03/24/2022 Access Code: [email or web address] URL: [email or web address] Date: 03/24/2022 Prepared by: Ricard Dillon  Exercises - Corner Balance Feet Apart: Eyes Open With Head Turns  - 1 x daily - 5 x weekly - 3 sets - 10 reps - Sit to Stand with Counter Support  - 1 x daily - 5 x weekly - 3 sets - 10 reps - Standing Tandem Balance with Counter Support  - 1 x daily - 7 x weekly - 2 sets - 2 reps - 30 hold - Romberg Stance  -  1 x daily - 7 x weekly - 2 sets - 2 reps - 30 hold  Access Code: [email or web address]   PT Short Term Goals      PT SHORT TERM GOAL #1   Title Pt will be independent with HEP in order to improve strength and balance in order to decrease fall risk and improve function at home.    Baseline 5/8: To be initiated; 8/1: pt not consistent with HEP   Time 6    Period Weeks    Status Ongoing    Target Date 04/21/22             PT Long Term Goals       PT LONG TERM GOAL #1   Title Patient will increase FOTO score to equal to or greater than  49 to demonstrate statistically significant improvement in mobility and quality of life.    Baseline 5/8: 39; 8/1: 47   Time 12    Period Weeks    Status Partially met    Target Date 05/19/2022      PT LONG TERM GOAL #2   Title Pt will decrease DHI score by at least 18 points in order to demonstrate clinically significant reduction in disability    Baseline 5/8: to be initiated; 8/1: 40    Time 12    Period Weeks    Status Ongoing    Target Date 05/19/2022      PT LONG TERM GOAL #3   Title Pt will improve DGI by at least 3 points in order to demonstrate clinically significant  improvement in balance and decreased risk for falls.    Baseline 5/8: to be initiated; 8/1: unable to complete due to insufficient time following other testing in session   Time 12    Period Weeks    Status Not met    Target Date 05/19/2022              Plan    Clinical Impression Statement Goals reassessed for recert. Still unable to complete full goal assessment due to time limitations following completion of [place] and FOTO. Pt did improve FOTO score indicating increased perception of QOL and functional mobility. DHI score still indicates moderate perception of handicap due to dizziness. Pt would like to take break from PT until she can see her physician. She reports she wants to pursue MRI. PT provided education on importance of HEP to see progress, and updated HEP for pt to perform at home for a month prior to coming back to PT. Pt and her spouse understand plan and are agreeable to plan. Patient's condition has the potential to improve in response to therapy. Maximum improvement is yet to be obtained. The anticipated improvement is attainable and reasonable in a generally predictable time.  Patient reports she does feel she has improved somewhat since starting PT. She no longer has difficulty rolling on her side in her bed. The pt will benefit from further skilled PT to decrease dizziness symptoms and improve QoL.    Personal Factors and Comorbidities Age;Comorbidity 3+;Sex;Time since onset of injury/illness/exacerbation    Comorbidities arthropathy of pelvic region and thigh, back pain, edema (has lymphedema pump), venous thrombosis and embolism, pre-diabets, HTN, PAC (holitor monitor), OA, hypothyroidism, gout, fatigue, palpitations, stage 3b chronic kidney disease, otitis externa, cellulitis LE    Examination-Activity Limitations Bed Mobility;Locomotion Level;Bend;Stairs    Examination-Participation Restrictions Community Activity;Meal Prep;Laundry;Cleaning;Shop;Yard Work     Advice worker  Good    PT Frequency 2x / week    PT Duration 12 weeks    PT Treatment/Interventions ADLs/Self Care Home Management;Canalith Repostioning;Cryotherapy;Electrical Stimulation;Moist Heat;Traction;Ultrasound;DME Instruction;Gait training;Fluidtherapy;Parrafin;Contrast Bath;Functional mobility training;Therapeutic activities;Stair training;Therapeutic exercise;Balance training;Neuromuscular re-education;Patient/family education;Orthotic Fit/Training;Wheelchair mobility training;Manual techniques;Passive range of motion;Scar mobilization;Dry needling;Energy conservation;Splinting;Taping;Vestibular;Visual/perceptual remediation/compensation;Joint Manipulations    PT Next Visit Plan balance and gait assessment and interventions, continue POC   PT Home Exercise Plan Access Code: [email or web address]; no updates    Consulted and Agree with Plan of Care Patient;Family member/caregiver    Family Member Consulted Husband               Zollie Pee, [place] 03/24/2022, 4:53 PM

## 2022-03-31 ENCOUNTER — Ambulatory Visit: Payer: PPO

## 2022-04-03 DIAGNOSIS — R42 Dizziness and giddiness: Secondary | ICD-10-CM | POA: Diagnosis not present

## 2022-04-03 DIAGNOSIS — I10 Essential (primary) hypertension: Secondary | ICD-10-CM | POA: Diagnosis not present

## 2022-04-03 DIAGNOSIS — H8113 Benign paroxysmal vertigo, bilateral: Secondary | ICD-10-CM | POA: Diagnosis not present

## 2022-04-17 ENCOUNTER — Emergency Department: Payer: PPO

## 2022-04-17 ENCOUNTER — Other Ambulatory Visit: Payer: Self-pay

## 2022-04-17 ENCOUNTER — Emergency Department
Admission: EM | Admit: 2022-04-17 | Discharge: 2022-04-17 | Disposition: A | Discharge status: Home / Self Care | Payer: PPO | Attending: Emergency Medicine | Admitting: Emergency Medicine

## 2022-04-17 ENCOUNTER — Encounter: Payer: Self-pay | Admitting: Emergency Medicine

## 2022-04-17 DIAGNOSIS — R350 Frequency of micturition: Secondary | ICD-10-CM | POA: Diagnosis not present

## 2022-04-17 DIAGNOSIS — R2241 Localized swelling, mass and lump, right lower limb: Secondary | ICD-10-CM | POA: Insufficient documentation

## 2022-04-17 DIAGNOSIS — M25561 Pain in right knee: Secondary | ICD-10-CM | POA: Insufficient documentation

## 2022-04-17 DIAGNOSIS — M7121 Synovial cyst of popliteal space [Baker], right knee: Secondary | ICD-10-CM | POA: Insufficient documentation

## 2022-04-17 DIAGNOSIS — Z96651 Presence of right artificial knee joint: Secondary | ICD-10-CM | POA: Insufficient documentation

## 2022-04-17 DIAGNOSIS — Z471 Aftercare following joint replacement surgery: Secondary | ICD-10-CM | POA: Diagnosis not present

## 2022-04-17 DIAGNOSIS — Z7982 Long term (current) use of aspirin: Secondary | ICD-10-CM | POA: Insufficient documentation

## 2022-04-17 DIAGNOSIS — T8484XA Pain due to internal orthopedic prosthetic devices, implants and grafts, initial encounter: Secondary | ICD-10-CM

## 2022-04-17 DIAGNOSIS — M25461 Effusion, right knee: Secondary | ICD-10-CM

## 2022-04-17 DIAGNOSIS — I10 Essential (primary) hypertension: Secondary | ICD-10-CM | POA: Diagnosis not present

## 2022-04-17 LAB — BASIC METABOLIC PANEL
Anion gap: 7 (ref 5–15)
BUN: 21 mg/dL (ref 8–23)
CO2: 25 mmol/L (ref 22–32)
Calcium: 9.3 mg/dL (ref 8.9–10.3)
Chloride: 109 mmol/L (ref 98–111)
Creatinine, Ser: 0.92 mg/dL (ref 0.44–1.00)
GFR, Estimated: >60 mL/min (ref >60)
Glucose, Bld: 117 mg/dL — ABNORMAL HIGH (ref 70–99)
Potassium: 3.7 mmol/L (ref 3.5–5.1)
Sodium: 141 mmol/L (ref 135–145)

## 2022-04-17 LAB — CBC
HCT: 46.8 % — ABNORMAL HIGH (ref 36.0–46.0)
Hemoglobin: 14.9 g/dL (ref 12.0–15.0)
MCH: 30.2 pg (ref 26.0–34.0)
MCHC: 31.8 g/dL (ref 30.0–36.0)
MCV: 94.7 fL (ref 80.0–100.0)
Platelets: 333 10*3/uL (ref 150–400)
RBC: 4.94 MIL/uL (ref 3.87–5.11)
RDW: 15.2 % (ref 11.5–15.5)
WBC: 9.4 10*3/uL (ref 4.0–10.5)
nRBC: 0 % (ref 0.0–0.2)

## 2022-04-17 LAB — URINALYSIS, COMPLETE (UACMP) WITH MICROSCOPIC
Bilirubin Urine: NEGATIVE
Glucose, UA: NEGATIVE mg/dL
Hgb urine dipstick: NEGATIVE
Ketones, ur: NEGATIVE mg/dL
Leukocytes,Ua: NEGATIVE
Nitrite: NEGATIVE
Protein, ur: NEGATIVE mg/dL
Specific Gravity, Urine: 1.01 (ref 1.005–1.030)
pH: 8 (ref 5.0–8.0)

## 2022-04-17 LAB — SEDIMENTATION RATE: Sed Rate: 3 mm/hr (ref 0–30)

## 2022-04-17 MED ORDER — PREDNISONE 10 MG PO TABS: 10.0000 mg | ORAL_TABLET | Freq: Every day | ORAL | 0 refills | Status: DC

## 2022-04-17 MED ORDER — HYDROCODONE-ACETAMINOPHEN 5-325 MG PO TABS
1.0000 | ORAL_TABLET | ORAL | Status: AC
  Administered 2022-04-17: 1 via ORAL
  Filled 2022-04-17: qty 1

## 2022-04-17 NOTE — ED Provider Notes (Addendum)
[facility] EMERGENCY DEPARTMENT Provider Note   CSN: [id number] Arrival date & time: 04/17/22  1510     History  Chief Complaint  Patient presents with   Knee Pain    Kayla Chan is a 88 y.o. female with past medical history of right total knee arthroplasty performed in 2013, congestive heart failure, chronic kidney disease, hypertension, hyperlipidemia presents to the emergency department for evaluation of acute right knee pain that began yesterday.  She denies any trauma or injury.  No warmth redness or fevers.  States she has a history of Baker's cyst that formed 2 years ago and ruptured forming cellulitis in the right lower extremity.  She does complain of some pain behind the knee but also on top of the knee.  She denies any groin or thigh pain.  She is having a hard time ambulating.  HPI     Home Medications Prior to Admission medications   Medication Sig Start Date End Date Taking? Authorizing Provider  predniSONE (DELTASONE) 10 MG tablet Take 1 tablet (10 mg total) by mouth daily. 6,5,4,3,2,1 six day taper 04/17/22  Yes Duanne Guess, PA-C  acetaminophen (TYLENOL) 325 MG tablet Take 2 tablets (650 mg total) by mouth every 6 (six) hours as needed for mild pain (or Fever >/= 101). 12/05/20   Loletha Grayer, MD  aspirin 81 MG tablet Take 81 mg by mouth daily.    [provider]  chlorthalidone (HYGROTON) 25 MG tablet Take 0.5 tablets (12.5 mg total) by mouth daily. 03/25/21   Birdie Sons, MD  ezetimibe (ZETIA) 10 MG tablet Take 1 tablet (10 mg total) by mouth daily. 04/21/21   Birdie Sons, MD  levothyroxine (SYNTHROID) 75 MCG tablet TAKE 1 TABLET BY MOUTH EVERY DAY 01/26/21   Birdie Sons, MD      Allergies    Lisinopril, Amlodipine, Atorvastatin, Bactrim [sulfamethoxazole-trimethoprim], Colestid  [colestipol hcl], Colestipol, Crestor  [rosuvastatin calcium], Doxycycline, Fluvastatin sodium, Furosemide, Labetalol, Losartan,  Pravastatin sodium, Verapamil, Allopurinol, and Penicillins    Review of Systems   Review of Systems  Physical Exam Updated Vital Signs BP (!) 188/107   Pulse 78   Temp 98 F (36.7 C) (Oral)   Resp 18   Ht 5' (1.524 m)   Wt 104.3 kg   SpO2 97%   BMI 44.92 kg/m  Physical Exam Constitutional:      Appearance: She is well-developed.  HENT:     Head: Normocephalic and atraumatic.  Eyes:     Conjunctiva/sclera: Conjunctivae normal.  Cardiovascular:     Rate and Rhythm: Normal rate.  Pulmonary:     Effort: Pulmonary effort is normal. No respiratory distress.  Musculoskeletal:        General: Normal range of motion.     Cervical back: Normal range of motion.     Comments: Right knee shows incision completely healed.  Minimal swelling no significant effusion.  No palpable Baker's cyst.  No warmth or redness.  Patient has pain with maintaining leg extension, there is no palpable defect in quad tendon.  Patella is tracking well.  No pain with hip internal or external rotation.  She is neuro vas intact in right lower extremity with 2+ dorsalis pedis pulses.  A normal ankle plantarflexion dorsiflexion.  Skin:    General: Skin is warm.     Findings: No rash.  Neurological:     Mental Status: She is alert and oriented to person, place, and time.  Psychiatric:  Behavior: Behavior normal.        Thought Content: Thought content normal.     ED Results / Procedures / Treatments   Labs (all labs ordered are listed, but only abnormal results are displayed) Labs Reviewed  CBC - Abnormal; Notable for the following components:      Result Value   HCT 46.8 (*)    All other components within normal limits  BASIC METABOLIC PANEL - Abnormal; Notable for the following components:   Glucose, Bld 117 (*)    All other components within normal limits  SEDIMENTATION RATE  URINALYSIS, COMPLETE (UACMP) WITH MICROSCOPIC    EKG None  Radiology US Venous Img Lower Unilateral  Right  Result Date: 04/17/2022 CLINICAL DATA:  Painful total RIGHT knee. EXAM: RIGHT LOWER EXTREMITY VENOUS DOPPLER ULTRASOUND TECHNIQUE: Gray-scale sonography with compression, as well as color and duplex ultrasound, were performed to evaluate the deep venous system(s) from the level of the common femoral vein through the popliteal and proximal calf veins. COMPARISON:  12/02/2020 FINDINGS: VENOUS Normal compressibility of the common femoral, superficial femoral, and popliteal veins, as well as the visualized calf veins. Visualized portions of profunda femoral vein and great saphenous vein unremarkable. No filling defects to suggest DVT on grayscale or color Doppler imaging. Doppler waveforms show normal direction of venous flow, normal respiratory plasticity and response to augmentation. Limited views of the contralateral common femoral vein are unremarkable. OTHER In the popliteal fossa, a fluid collection measures 2.5 x 4.5 x 1.2 centimeters. Limitations: none IMPRESSION: No evidence for occlusive deep vein thrombosis. Small fluid collection in the popliteal fossa. Electronically Signed   By: Nolon Nations M.D.   On: 04/17/2022 17:31   DG Knee Complete 4 Views Right  Result Date: 04/17/2022 CLINICAL DATA:  Right knee pain since yesterday. History of total knee arthroplasty 10 years ago. EXAM: RIGHT KNEE - COMPLETE 4+ VIEW COMPARISON:  Radiographs 09/10/2011. FINDINGS: Status post right total knee arthroplasty. The hardware is intact without evidence of loosening. There is a healed fracture of the distal femoral metaphysis. No evidence of acute fracture or dislocation. A small nonspecific knee joint effusion is noted. IMPRESSION: Small knee joint effusion. No acute osseous findings post total knee arthroplasty. Electronically Signed   By: Richardean Sale M.D.   On: 04/17/2022 16:53    Procedures Procedures   Medications Ordered in ED Medications  HYDROcodone-acetaminophen (NORCO/VICODIN) 5-325 MG  per tablet 1 tablet (1 tablet Oral Given 04/17/22 1637)    ED Course/ Medical Decision Making/ A&P                           Medical Decision Making Amount and/or Complexity of Data Reviewed Labs: ordered. Radiology: ordered.  Risk Prescription drug management.  88 year old female with history of right total knee arthroplasty presents with acute episode of pain.  No falls trauma or injury.  She describes pain behind the knee as well as generalized throughout the knee.  No warmth redness or fevers.  History of Baker's cyst a couple of years ago that caused similar pain.  Today ultrasound showed no DVT but did show some fluid in the popliteal region consistent with a Baker's cyst.  She has normal blood work with normal sed rate which should rule out any type of septic joint.  Her x-rays show no evidence of hardware loosening.  Patient is able to ambulate.  I placed her on a steroid taper for 6 days and recommend  she follow-up with orthopedics if no improvement in 1 week.  She understands signs and symptoms return to the ER for.    UA pending     Final Clinical Impression(s) / ED Diagnoses Final diagnoses:  Pain due to total right knee replacement, initial encounter ([place])  Pain and swelling of right knee  Increased urinary frequency  Baker's cyst, right    Rx / DC Orders ED Discharge Orders          Ordered    predniSONE (DELTASONE) 10 MG tablet  Daily        04/17/22 2006              Renata Caprice 04/17/22 2009    Nena Polio, MD 04/17/22 2015    Duanne Guess, PA-C 05/06/22 1352    Nena Polio, MD 05/08/22 1205

## 2022-04-17 NOTE — ED Triage Notes (Addendum)
Pt coming from home via [facility]. Per EMS, pt called out c/o of right knee pain that started yesterday afternoon. Pt states she had surgery a year ago on right knee due to cyst being on back of knee. Pt states she has had knee replacement on bilateral knees which was about 10 years ago.   Pt is able to ambulate but states it is very painful to walk due to knee pain. Pt was able to move from EMS stretcher to bed with assistance.   BP with EMS was 212/100. Pt recently stopped taking BP medication per pt's PCP. Other vitals were stable per EMS.

## 2022-04-17 NOTE — Discharge Instructions (Signed)
Please follow-up with your orthopedist in 1 week if continued pain or discomfort.  Return to the ER for any fevers, increasing swelling, any worsening symptoms or any urgent changes in your health

## 2022-04-17 NOTE — ED Notes (Signed)
Pt verbalized understanding of discharge instructions and follow-up care instructions. Pt advised if symptoms worsen to return to ED. E-signature not available due to e-signature pad not working.

## 2022-04-24 DIAGNOSIS — I10 Essential (primary) hypertension: Secondary | ICD-10-CM | POA: Diagnosis not present

## 2022-04-24 DIAGNOSIS — H8113 Benign paroxysmal vertigo, bilateral: Secondary | ICD-10-CM | POA: Diagnosis not present

## 2022-04-24 DIAGNOSIS — M79661 Pain in right lower leg: Secondary | ICD-10-CM | POA: Diagnosis not present

## 2022-04-24 DIAGNOSIS — R600 Localized edema: Secondary | ICD-10-CM | POA: Diagnosis not present

## 2022-04-24 DIAGNOSIS — M7989 Other specified soft tissue disorders: Secondary | ICD-10-CM | POA: Diagnosis not present

## 2022-04-24 DIAGNOSIS — L03115 Cellulitis of right lower limb: Secondary | ICD-10-CM | POA: Diagnosis not present

## 2022-04-30 ENCOUNTER — Other Ambulatory Visit: Payer: Self-pay

## 2022-04-30 ENCOUNTER — Emergency Department
Admission: EM | Admit: 2022-04-30 | Discharge: 2022-04-30 | Disposition: A | Discharge status: Home / Self Care | Payer: PPO | Attending: Emergency Medicine | Admitting: Emergency Medicine

## 2022-04-30 ENCOUNTER — Emergency Department: Payer: PPO

## 2022-04-30 DIAGNOSIS — M660 Rupture of popliteal cyst: Secondary | ICD-10-CM | POA: Insufficient documentation

## 2022-04-30 DIAGNOSIS — M7121 Synovial cyst of popliteal space [Baker], right knee: Secondary | ICD-10-CM | POA: Diagnosis not present

## 2022-04-30 DIAGNOSIS — L03115 Cellulitis of right lower limb: Secondary | ICD-10-CM | POA: Diagnosis not present

## 2022-04-30 DIAGNOSIS — N183 Chronic kidney disease, stage 3 unspecified: Secondary | ICD-10-CM | POA: Insufficient documentation

## 2022-04-30 DIAGNOSIS — I129 Hypertensive chronic kidney disease with stage 1 through stage 4 chronic kidney disease, or unspecified chronic kidney disease: Secondary | ICD-10-CM | POA: Insufficient documentation

## 2022-04-30 DIAGNOSIS — M79604 Pain in right leg: Secondary | ICD-10-CM | POA: Diagnosis present

## 2022-04-30 DIAGNOSIS — R42 Dizziness and giddiness: Secondary | ICD-10-CM | POA: Diagnosis not present

## 2022-04-30 DIAGNOSIS — E039 Hypothyroidism, unspecified: Secondary | ICD-10-CM | POA: Diagnosis not present

## 2022-04-30 LAB — CBC WITH DIFFERENTIAL/PLATELET
Abs Immature Granulocytes: 0.03 10*3/uL (ref 0.00–0.07)
Basophils Absolute: 0.1 10*3/uL (ref 0.0–0.1)
Basophils Relative: 1 %
Eosinophils Absolute: 0.2 10*3/uL (ref 0.0–0.5)
Eosinophils Relative: 2 %
HCT: 40.7 % (ref 36.0–46.0)
Hemoglobin: 12.7 g/dL (ref 12.0–15.0)
Immature Granulocytes: 0 %
Lymphocytes Relative: 22 %
Lymphs Abs: 2.3 10*3/uL (ref 0.7–4.0)
MCH: 30.1 pg (ref 26.0–34.0)
MCHC: 31.2 g/dL (ref 30.0–36.0)
MCV: 96.4 fL (ref 80.0–100.0)
Monocytes Absolute: 1 10*3/uL (ref 0.1–1.0)
Monocytes Relative: 9 %
Neutro Abs: 6.9 10*3/uL (ref 1.7–7.7)
Neutrophils Relative %: 66 %
Platelets: 353 10*3/uL (ref 150–400)
RBC: 4.22 MIL/uL (ref 3.87–5.11)
RDW: 15.3 % (ref 11.5–15.5)
WBC: 10.5 10*3/uL (ref 4.0–10.5)
nRBC: 0 % (ref 0.0–0.2)

## 2022-04-30 LAB — COMPREHENSIVE METABOLIC PANEL
ALT: 15 U/L (ref 0–44)
AST: 19 U/L (ref 15–41)
Albumin: 3.3 g/dL — ABNORMAL LOW (ref 3.5–5.0)
Alkaline Phosphatase: 82 U/L (ref 38–126)
Anion gap: 7 (ref 5–15)
BUN: 24 mg/dL — ABNORMAL HIGH (ref 8–23)
CO2: 26 mmol/L (ref 22–32)
Calcium: 8.8 mg/dL — ABNORMAL LOW (ref 8.9–10.3)
Chloride: 107 mmol/L (ref 98–111)
Creatinine, Ser: 1 mg/dL (ref 0.44–1.00)
GFR, Estimated: 55 mL/min — ABNORMAL LOW (ref >60)
Glucose, Bld: 111 mg/dL — ABNORMAL HIGH (ref 70–99)
Potassium: 4 mmol/L (ref 3.5–5.1)
Sodium: 140 mmol/L (ref 135–145)
Total Bilirubin: 1.2 mg/dL (ref 0.3–1.2)
Total Protein: 6.8 g/dL (ref 6.5–8.1)

## 2022-04-30 LAB — LACTIC ACID, PLASMA: Lactic Acid, Venous: 1.1 mmol/L (ref 0.5–1.9)

## 2022-04-30 MED ORDER — SODIUM CHLORIDE 0.9 % IV SOLN
1.0000 g | Freq: Once | INTRAVENOUS | Status: AC
  Administered 2022-04-30: 1 g via INTRAVENOUS
  Filled 2022-04-30: qty 10

## 2022-04-30 NOTE — Discharge Instructions (Signed)
Call Dr. Blane Ohara office tomorrow so that he can advise you on your antibiotics and the dizziness that they are causing.  You were given Rocephin IV while in the ED.  Also you will need to follow-up with the orthopedist for possible ruptured or hemorrhagic popliteal cyst of your right lower extremity.

## 2022-04-30 NOTE — ED Triage Notes (Signed)
Pt presents to ED with c/o of posterior R knee pain. Pt states seen for same and had a [place] and was supposed to f/up but has not yet. Pt also states she ha snot been taking ABX because "it makes me sick".

## 2022-04-30 NOTE — ED Provider Notes (Signed)
[facility] Provider Note    Event Date/Time   First MD Initiated Contact with Patient 04/30/22 1311     (approximate)   History   Leg Pain   HPI  Kayla Chan is a 88 y.o. female   presents to the ED with complaint of right posterior knee pain and also lower leg redness.  She was seen by her PCP recently at which time she was prescribed doxycycline and cephalexin for what he believes to be a cellulitis.  Patient has been unable to take either medications as they are making her "dizzy".  Patient states that most antibiotics cause dizziness and she is unable to take them orally.  Husband states that she did have an ultrasound of her right leg approximately 2 weeks ago which mentioned that she had some fluid behind her knee but was not told she had a Baker's cyst.  This morning she woke and had a bruising to the posterior area without known injury.  She denies any fever, chills, nausea or vomiting.  Patient has a history of prediabetes, hypertension, PA-C, hypothyroidism, stage III chronic kidney disease, decreased GFR, cellulitis lower extremity and history of ruptured Baker's cyst.      Physical Exam   Triage Vital Signs: ED Triage Vitals  Enc Vitals Group     BP 04/30/22 1217 (!) 164/87     Pulse Rate 04/30/22 1217 88     Resp 04/30/22 1217 17     Temp 04/30/22 1217 97.9 F (36.6 C)     Temp Source 04/30/22 1217 Oral     SpO2 04/30/22 1217 95 %     Weight 04/30/22 1257 229 lb 15 oz (104.3 kg)     Height 04/30/22 1257 5' (1.524 m)     Head Circumference --      Peak Flow --      Pain Score 04/30/22 1217 9     Pain Loc --      Pain Edu? --      Excl. in [place]? --     Most recent vital signs: Vitals:   04/30/22 1217  BP: (!) 164/87  Pulse: 88  Resp: 17  Temp: 97.9 F (36.6 C)  SpO2: 95%     General: Awake, no distress.  CV:  Good peripheral perfusion.  Resp:  Normal effort.  Abd:  No distention.  Other:  On examination of the right  lower extremity there is a large ecchymotic area with minimal tenderness posteriorly popliteal area.  No gross deformity.  Patient has erythema and warmth to the lower extremity approximately one third above her ankle area.  No pitting edema appreciated.  No drainage.  Patient also has chronic skin issues suggesting peripheral vascular disease.   ED Results / Procedures / Treatments   Labs (all labs ordered are listed, but only abnormal results are displayed) Labs Reviewed  COMPREHENSIVE METABOLIC PANEL - Abnormal; Notable for the following components:      Result Value   Glucose, Bld 111 (*)    BUN 24 (*)    Calcium 8.8 (*)    Albumin 3.3 (*)    GFR, Estimated 55 (*)    All other components within normal limits  CBC WITH DIFFERENTIAL/PLATELET  LACTIC ACID, PLASMA  LACTIC ACID, PLASMA     RADIOLOGY Ultrasound venous right lower extremity is negative for DVT. There is a 17.3 x 3.1 x 9cm complex fluid collection in the right popliteal fossa extending to the calf with  significant interval increase in size.  Possible hemorrhage within the popliteal cyst is not excluded.      PROCEDURES:  Critical Care performed:   Procedures   MEDICATIONS ORDERED IN ED: Medications  cefTRIAXone (ROCEPHIN) 1 g in sodium chloride 0.9 % 100 mL IVPB (1 g Intravenous New Bag/Given 04/30/22 1608)     IMPRESSION / MDM / ASSESSMENT AND PLAN / ED COURSE  I reviewed the triage vital signs and the nursing notes.   Differential diagnosis includes, but is not limited to, DVT right lower extremity, cellulitis, ruptured Baker's cyst, popliteal cyst.  88 year old female presents to the ED with complaint of right lower extremity bruising that occurred during the night.  Patient has been seen by Dr. Ola Spurr at [facility] and currently being treated for a cellulitis of her right leg.  Patient states that she has great difficulty taking any antibiotic p.o. as it causes dizziness almost always.  Patient  reports that she does not have any difficulty with IV antibiotics.  On physical exam there is some bruising noted in the popliteal space but skin is intact.  Distal tib-fib is erythematous and feels warm consistent with a cellulitis.  I discussed ultrasound findings with both patient and husband.  WBC was reassuring at 10.5, lactic acid 1.1 and CMP showed minimal elevation of her BUN at 24 and GFR low at 55 with a history of same.  I discussed IV antibiotics to the patient while she is in the ED and she is agreeable as these do not cause any dizziness.  We will give Rocephin 1 g IV and she is to contact Dr. Ola Spurr for further instructions as it was mentioned that he wanted her to see an orthopedist about her popliteal cyst.      Patient's presentation is most consistent with acute complicated illness / injury requiring diagnostic workup.  FINAL CLINICAL IMPRESSION(S) / ED DIAGNOSES   Final diagnoses:  Rupture of popliteal cyst of right knee region  Cellulitis of right lower extremity     Rx / DC Orders   ED Discharge Orders     None        Note:  This document was prepared using Dragon voice recognition software and may include unintentional dictation errors.   Johnn Hai, PA-C 04/30/22 1622    Vanessa Honokaa, MD 05/04/22 1536

## 2022-05-07 DIAGNOSIS — Z96651 Presence of right artificial knee joint: Secondary | ICD-10-CM | POA: Diagnosis not present

## 2022-05-07 DIAGNOSIS — M7121 Synovial cyst of popliteal space [Baker], right knee: Secondary | ICD-10-CM | POA: Diagnosis not present

## 2022-05-07 DIAGNOSIS — T8484XA Pain due to internal orthopedic prosthetic devices, implants and grafts, initial encounter: Secondary | ICD-10-CM | POA: Diagnosis not present

## 2022-05-08 ENCOUNTER — Ambulatory Visit: Payer: PPO

## 2022-05-13 ENCOUNTER — Ambulatory Visit: Payer: PPO

## 2022-05-14 DIAGNOSIS — Z23 Encounter for immunization: Secondary | ICD-10-CM | POA: Diagnosis not present

## 2022-05-14 DIAGNOSIS — M79661 Pain in right lower leg: Secondary | ICD-10-CM | POA: Diagnosis not present

## 2022-05-14 DIAGNOSIS — M7121 Synovial cyst of popliteal space [Baker], right knee: Secondary | ICD-10-CM | POA: Diagnosis not present

## 2022-05-14 DIAGNOSIS — H8113 Benign paroxysmal vertigo, bilateral: Secondary | ICD-10-CM | POA: Diagnosis not present

## 2022-05-14 DIAGNOSIS — M7989 Other specified soft tissue disorders: Secondary | ICD-10-CM | POA: Diagnosis not present

## 2022-05-14 DIAGNOSIS — I10 Essential (primary) hypertension: Secondary | ICD-10-CM | POA: Diagnosis not present

## 2022-05-15 DIAGNOSIS — L03116 Cellulitis of left lower limb: Secondary | ICD-10-CM | POA: Diagnosis not present

## 2022-05-21 DIAGNOSIS — M7989 Other specified soft tissue disorders: Secondary | ICD-10-CM | POA: Diagnosis not present

## 2022-05-21 DIAGNOSIS — H2513 Age-related nuclear cataract, bilateral: Secondary | ICD-10-CM | POA: Diagnosis not present

## 2022-05-21 DIAGNOSIS — R42 Dizziness and giddiness: Secondary | ICD-10-CM | POA: Diagnosis not present

## 2022-06-05 ENCOUNTER — Ambulatory Visit: Payer: PPO

## 2022-06-10 ENCOUNTER — Ambulatory Visit: Payer: PPO | Attending: Infectious Diseases

## 2022-06-10 ENCOUNTER — Ambulatory Visit: Payer: PPO

## 2022-06-12 ENCOUNTER — Ambulatory Visit: Payer: PPO

## 2022-06-16 DIAGNOSIS — R600 Localized edema: Secondary | ICD-10-CM | POA: Diagnosis not present

## 2022-06-16 DIAGNOSIS — Z23 Encounter for immunization: Secondary | ICD-10-CM | POA: Diagnosis not present

## 2022-06-16 DIAGNOSIS — M7989 Other specified soft tissue disorders: Secondary | ICD-10-CM | POA: Diagnosis not present

## 2022-06-16 DIAGNOSIS — M7121 Synovial cyst of popliteal space [Baker], right knee: Secondary | ICD-10-CM | POA: Diagnosis not present

## 2022-06-16 DIAGNOSIS — I10 Essential (primary) hypertension: Secondary | ICD-10-CM | POA: Diagnosis not present

## 2022-06-16 DIAGNOSIS — H8113 Benign paroxysmal vertigo, bilateral: Secondary | ICD-10-CM | POA: Diagnosis not present

## 2022-06-16 DIAGNOSIS — E538 Deficiency of other specified B group vitamins: Secondary | ICD-10-CM | POA: Diagnosis not present

## 2022-06-16 DIAGNOSIS — I890 Lymphedema, not elsewhere classified: Secondary | ICD-10-CM | POA: Insufficient documentation

## 2022-06-16 DIAGNOSIS — R42 Dizziness and giddiness: Secondary | ICD-10-CM | POA: Diagnosis not present

## 2022-06-16 DIAGNOSIS — N1831 Chronic kidney disease, stage 3a: Secondary | ICD-10-CM | POA: Diagnosis not present

## 2022-06-16 NOTE — Progress Notes (Unsigned)
MRN : [id number]  Kayla Chan is a 88 y.o. (05/01/1932) female who presents with chief complaint of legs swell.  History of Present Illness:   The patient returns to the office for followup evaluation regarding leg swelling.  The swelling has persisted and the pain associated with swelling continues. There have not been any interval development of a ulcerations or wounds.  Since the previous visit the patient has been wearing graduated compression stockings and has noted little if any improvement in the lymphedema. The patient has been using compression routinely morning until night.  The patient also states elevation during the day and exercise is being done too.   No outpatient medications have been marked as taking for the 06/18/22 encounter (Appointment) with Delana Meyer, Dolores Lory, MD.    Past Medical History:  Diagnosis Date   Cancer [facility])    [place] on nose   Carotid artery occlusion    CHF (congestive heart failure) ([place])    Chronic kidney disease    Gout    Hyperlipidemia    Hypertension    Lymphedema     Past Surgical History:  Procedure Laterality Date   24 hour Holter Monitor  01/2013   4550 ectopic beats with 344 superventricular bigemminy events   Abdominal Ultrasound  05/31/2013   RUQ. Fatty Liver   APPENDECTOMY  1950   [place]   REPLACEMENT TOTAL KNEE  09/10/2011   Inpatient, [place], [place]; Right    Social History Social History   Tobacco Use   Smoking status: Never   Smokeless tobacco: Never  Vaping Use   Vaping Use: Never used  Substance Use Topics   Alcohol use: Not Currently    Comment: 1 glass of wine seldom   Drug use: No    Family History Family History  Problem Relation Age of Onset   Stroke Mother        possible stroke   Heart Problems Father    Kidney cancer Sister    Bladder Cancer Brother     Allergies  Allergen Reactions   Lisinopril     Intense itching per patient can not tolerate    Amlodipine      dizziness   Atorvastatin     Other reaction(s): Muscle Pain   Bactrim [Sulfamethoxazole-Trimethoprim] Itching   Colestid  [Colestipol Hcl]     Itching, insomnia   Colestipol Other (See Comments)    insomnia   Crestor  [Rosuvastatin Calcium]     Other reaction(s): Muscle Pain   Doxycycline    Fluvastatin Sodium     Other reaction(s): Muscle Pain   Furosemide     Severe Cramping   Labetalol Other (See Comments)   Losartan     itching   Pravastatin Sodium     Numbness in legs   Verapamil     Shortness of breath, swelling, palpations.    Allopurinol Other (See Comments)    dizziness   Penicillins Rash    Took for pneumonia when she was 23 and mader her mouth break out     REVIEW OF SYSTEMS (Negative unless checked)  Constitutional: '[]'$ Weight loss  '[]'$ Fever  '[]'$ Chills Cardiac: '[]'$ Chest pain   '[]'$ Chest pressure   '[]'$ Palpitations   '[]'$ Shortness of breath when laying flat   '[]'$ Shortness of breath with exertion. Vascular:  '[]'$ Pain in legs with walking   '[x]'$ Pain in legs with standing  '[]'$ History of DVT   '[]'$ Phlebitis   '[x]'$ Swelling in legs   '[]'$ Varicose veins   '[]'$   Non-healing ulcers Pulmonary:   '[]'$ Uses home oxygen   '[]'$ Productive cough   '[]'$ Hemoptysis   '[]'$ Wheeze  '[]'$ COPD   '[]'$ Asthma Neurologic:  '[]'$ Dizziness   '[]'$ Seizures   '[]'$ History of stroke   '[]'$ History of TIA  '[]'$ Aphasia   '[]'$ Vissual changes   '[]'$ Weakness or numbness in arm   '[]'$ Weakness or numbness in leg Musculoskeletal:   '[]'$ Joint swelling   '[]'$ Joint pain   '[]'$ Low back pain Hematologic:  '[]'$ Easy bruising  '[]'$ Easy bleeding   '[]'$ Hypercoagulable state   '[]'$ Anemic Gastrointestinal:  '[]'$ Diarrhea   '[]'$ Vomiting  '[]'$ Gastroesophageal reflux/heartburn   '[]'$ Difficulty swallowing. Genitourinary:  '[]'$ Chronic kidney disease   '[]'$ Difficult urination  '[]'$ Frequent urination   '[]'$ Blood in urine Skin:  '[]'$ Rashes   '[]'$ Ulcers  Psychological:  '[]'$ History of anxiety   '[]'$  History of major depression.  Physical Examination  There were no vitals filed for this visit. There is no height or  weight on file to calculate BMI. Gen: WD/WN, NAD Head: [place]/AT, No temporalis wasting.  Ear/Nose/Throat: Hearing grossly intact, nares w/o erythema or drainage, pinna without lesions Eyes: PER, EOMI, sclera nonicteric.  Neck: Supple, no gross masses.  No JVD.  Pulmonary:  Good air movement, no audible wheezing, no use of accessory muscles.  Cardiac: RRR, precordium not hyperdynamic. Vascular:  scattered varicosities present bilaterally.  Mild venous stasis changes to the legs bilaterally.  3-4+ soft pitting edema, CEAP [id number]  Vessel Right Left  Radial Palpable Palpable  Gastrointestinal: soft, non-distended. No guarding/no peritoneal signs.  Musculoskeletal: M/S 5/5 throughout.  No deformity.  Neurologic: CN 2-12 intact. Pain and light touch intact in extremities.  Symmetrical.  Speech is fluent. Motor exam as listed above. Psychiatric: Judgment intact, Mood & affect appropriate for pt's clinical situation. Dermatologic: Venous rashes no ulcers noted.  No changes consistent with cellulitis. Lymph : No lichenification or skin changes of chronic lymphedema.  CBC Lab Results  Component Value Date   WBC 10.5 04/30/2022   HGB 12.7 04/30/2022   HCT 40.7 04/30/2022   MCV 96.4 04/30/2022   PLT 353 04/30/2022    BMET    Component Value Date/Time   NA 140 04/30/2022 1433   NA 142 08/19/2020 1358   NA 137 08/05/2013 1238   K 4.0 04/30/2022 1433   K 3.8 08/05/2013 1238   CL 107 04/30/2022 1433   CL 104 08/05/2013 1238   CO2 26 04/30/2022 1433   CO2 26 08/05/2013 1238   GLUCOSE 111 (H) 04/30/2022 1433   GLUCOSE 125 (H) 08/05/2013 1238   BUN 24 (H) 04/30/2022 1433   BUN 23 08/19/2020 1358   BUN 19 (H) 08/05/2013 1238   CREATININE 1.00 04/30/2022 1433   CREATININE 1.10 08/05/2013 1238   CALCIUM 8.8 (L) 04/30/2022 1433   CALCIUM 9.5 08/05/2013 1238   GFRNONAA 55 (L) 04/30/2022 1433   GFRNONAA 48 (L) 08/05/2013 1238   GFRAA 53 (L) 08/19/2020 1358   GFRAA 55 (L) 08/05/2013 1238    CrCl cannot be calculated (Patient's most recent lab result is older than the maximum 21 days allowed.).  COAG No results found for: "INR", "PROTIME"  Radiology No results found.   Assessment/Plan There are no diagnoses linked to this encounter.   Hortencia Pilar, MD  06/16/2022 6:25 PM

## 2022-06-17 ENCOUNTER — Ambulatory Visit: Payer: PPO

## 2022-06-18 ENCOUNTER — Ambulatory Visit (INDEPENDENT_AMBULATORY_CARE_PROVIDER_SITE_OTHER): Payer: PPO | Admitting: Vascular Surgery

## 2022-06-18 ENCOUNTER — Encounter (INDEPENDENT_AMBULATORY_CARE_PROVIDER_SITE_OTHER): Payer: Self-pay | Admitting: Vascular Surgery

## 2022-06-18 VITALS — BP 190/86 | HR 86 | Resp 16 | Ht 60.0 in | Wt 239.0 lb

## 2022-06-18 DIAGNOSIS — I890 Lymphedema, not elsewhere classified: Secondary | ICD-10-CM | POA: Diagnosis not present

## 2022-06-18 DIAGNOSIS — M1990 Unspecified osteoarthritis, unspecified site: Secondary | ICD-10-CM | POA: Diagnosis not present

## 2022-06-18 DIAGNOSIS — E785 Hyperlipidemia, unspecified: Secondary | ICD-10-CM

## 2022-06-18 DIAGNOSIS — I10 Essential (primary) hypertension: Secondary | ICD-10-CM | POA: Diagnosis not present

## 2022-06-19 ENCOUNTER — Ambulatory Visit: Payer: PPO

## 2022-06-24 ENCOUNTER — Ambulatory Visit: Payer: PPO

## 2022-07-01 DIAGNOSIS — H2512 Age-related nuclear cataract, left eye: Secondary | ICD-10-CM | POA: Diagnosis not present

## 2022-07-09 ENCOUNTER — Ambulatory Visit (INDEPENDENT_AMBULATORY_CARE_PROVIDER_SITE_OTHER): Payer: PPO | Admitting: Vascular Surgery

## 2022-07-28 ENCOUNTER — Encounter: Payer: Self-pay | Admitting: Ophthalmology

## 2022-07-28 DIAGNOSIS — R42 Dizziness and giddiness: Secondary | ICD-10-CM | POA: Diagnosis not present

## 2022-07-28 DIAGNOSIS — M7989 Other specified soft tissue disorders: Secondary | ICD-10-CM | POA: Diagnosis not present

## 2022-07-28 DIAGNOSIS — I10 Essential (primary) hypertension: Secondary | ICD-10-CM | POA: Diagnosis not present

## 2022-07-29 NOTE — Discharge Instructions (Signed)

## 2022-07-31 ENCOUNTER — Ambulatory Visit: Payer: PPO | Admitting: Anesthesiology

## 2022-07-31 ENCOUNTER — Other Ambulatory Visit: Payer: Self-pay

## 2022-07-31 ENCOUNTER — Encounter
Admission: RE | Disposition: A | Discharge status: Home / Self Care | Payer: Self-pay | Source: Home / Self Care | Attending: Ophthalmology

## 2022-07-31 ENCOUNTER — Encounter: Payer: Self-pay | Admitting: Ophthalmology

## 2022-07-31 ENCOUNTER — Ambulatory Visit
Admission: RE | Admit: 2022-07-31 | Discharge: 2022-07-31 | Disposition: A | Discharge status: Home / Self Care | Payer: PPO | Attending: Ophthalmology | Admitting: Ophthalmology

## 2022-07-31 DIAGNOSIS — N183 Chronic kidney disease, stage 3 unspecified: Secondary | ICD-10-CM | POA: Insufficient documentation

## 2022-07-31 DIAGNOSIS — E785 Hyperlipidemia, unspecified: Secondary | ICD-10-CM | POA: Diagnosis not present

## 2022-07-31 DIAGNOSIS — H2512 Age-related nuclear cataract, left eye: Secondary | ICD-10-CM | POA: Diagnosis not present

## 2022-07-31 DIAGNOSIS — I509 Heart failure, unspecified: Secondary | ICD-10-CM | POA: Diagnosis not present

## 2022-07-31 DIAGNOSIS — I129 Hypertensive chronic kidney disease with stage 1 through stage 4 chronic kidney disease, or unspecified chronic kidney disease: Secondary | ICD-10-CM | POA: Diagnosis not present

## 2022-07-31 DIAGNOSIS — E039 Hypothyroidism, unspecified: Secondary | ICD-10-CM | POA: Insufficient documentation

## 2022-07-31 DIAGNOSIS — I130 Hypertensive heart and chronic kidney disease with heart failure and stage 1 through stage 4 chronic kidney disease, or unspecified chronic kidney disease: Secondary | ICD-10-CM | POA: Insufficient documentation

## 2022-07-31 DIAGNOSIS — M109 Gout, unspecified: Secondary | ICD-10-CM | POA: Insufficient documentation

## 2022-07-31 DIAGNOSIS — Z6841 Body Mass Index (BMI) 40.0 and over, adult: Secondary | ICD-10-CM | POA: Diagnosis not present

## 2022-07-31 DIAGNOSIS — N1832 Chronic kidney disease, stage 3b: Secondary | ICD-10-CM | POA: Diagnosis not present

## 2022-07-31 HISTORY — DX: Presence of external hearing-aid: Z97.4

## 2022-07-31 HISTORY — DX: Hypothyroidism, unspecified: E03.9

## 2022-07-31 HISTORY — PX: CATARACT EXTRACTION W/PHACO: SHX586

## 2022-07-31 SURGERY — PHACOEMULSIFICATION, CATARACT, WITH IOL INSERTION
Anesthesia: Monitor Anesthesia Care | Site: Eye | Laterality: Left

## 2022-07-31 MED ORDER — TETRACAINE HCL 0.5 % OP SOLN
1.0000 [drp] | OPHTHALMIC | Status: DC | PRN
Start: 2022-07-31 — End: 2022-07-31
  Administered 2022-07-31 (×3): 1 [drp] via OPHTHALMIC

## 2022-07-31 MED ORDER — TRYPAN BLUE 0.06 % IO SOSY
PREFILLED_SYRINGE | INTRAOCULAR | Status: DC | PRN
  Administered 2022-07-31: .5 mL via INTRAOCULAR

## 2022-07-31 MED ORDER — ARMC OPHTHALMIC DILATING DROPS
1.0000 | OPHTHALMIC | Status: DC | PRN
  Administered 2022-07-31 (×3): 1 via OPHTHALMIC

## 2022-07-31 MED ORDER — MOXIFLOXACIN HCL 0.5 % OP SOLN
OPHTHALMIC | Status: DC | PRN
  Administered 2022-07-31: .2 mL via OPHTHALMIC

## 2022-07-31 MED ORDER — SIGHTPATH DOSE#1 BSS IO SOLN
INTRAOCULAR | Status: DC | PRN
  Administered 2022-07-31: 15 mL

## 2022-07-31 MED ORDER — BRIMONIDINE TARTRATE-TIMOLOL 0.2-0.5 % OP SOLN
OPHTHALMIC | Status: DC | PRN
  Administered 2022-07-31: 1 [drp] via OPHTHALMIC

## 2022-07-31 MED ORDER — LACTATED RINGERS IV SOLN: INTRAVENOUS | Status: DC

## 2022-07-31 MED ORDER — SODIUM CHLORIDE 0.9% FLUSH
INTRAVENOUS | Status: DC | PRN
  Administered 2022-07-31: 10 mL via INTRAVENOUS

## 2022-07-31 MED ORDER — SIGHTPATH DOSE#1 BSS IO SOLN
INTRAOCULAR | Status: DC | PRN
  Administered 2022-07-31: 97 mL via OPHTHALMIC

## 2022-07-31 MED ORDER — SIGHTPATH DOSE#1 BSS IO SOLN
INTRAOCULAR | Status: DC | PRN
  Administered 2022-07-31: 1 mL

## 2022-07-31 MED ORDER — FENTANYL CITRATE (PF) 100 MCG/2ML IJ SOLN
INTRAMUSCULAR | Status: DC | PRN
  Administered 2022-07-31: 25 ug via INTRAVENOUS

## 2022-07-31 MED ORDER — MIDAZOLAM HCL 2 MG/2ML IJ SOLN
INTRAMUSCULAR | Status: DC | PRN
  Administered 2022-07-31: .5 mg via INTRAVENOUS

## 2022-07-31 MED ORDER — SIGHTPATH DOSE#1 NA CHONDROIT SULF-NA HYALURON 40-17 MG/ML IO SOLN
INTRAOCULAR | Status: DC | PRN
  Administered 2022-07-31: 1 mL via INTRAOCULAR

## 2022-07-31 SURGICAL SUPPLY — 15 items
CANNULA ANT/CHMB 27G (MISCELLANEOUS) IMPLANT
CANNULA ANT/CHMB 27GA (MISCELLANEOUS) ×1 IMPLANT
CATARACT SUITE SIGHTPATH (MISCELLANEOUS) ×1 IMPLANT
FEE CATARACT SUITE SIGHTPATH (MISCELLANEOUS) ×1 IMPLANT
GLOVE SURG ENC TEXT LTX SZ8 (GLOVE) ×1 IMPLANT
GLOVE SURG TRIUMPH 8.0 PF LTX (GLOVE) ×1 IMPLANT
LENS IOL TECNIS EYHANCE 23.5 (Intraocular Lens) IMPLANT
NDL FILTER BLUNT 18X1 1/2 (NEEDLE) ×1 IMPLANT
NEEDLE FILTER BLUNT 18X1 1/2 (NEEDLE) ×1 IMPLANT
PACK VIT ANT 23G (MISCELLANEOUS) IMPLANT
RING MALYGIN (MISCELLANEOUS) IMPLANT
SUT ETHILON 10-0 CS-B-6CS-B-6 (SUTURE)
SUTURE EHLN 10-0 CS-B-6CS-B-6 (SUTURE) IMPLANT
SYR 3ML LL SCALE MARK (SYRINGE) ×1 IMPLANT
WATER STERILE IRR 250ML POUR (IV SOLUTION) ×1 IMPLANT

## 2022-07-31 NOTE — Op Note (Signed)
PREOPERATIVE DIAGNOSIS:  Nuclear sclerotic cataract of the left eye.   POSTOPERATIVE DIAGNOSIS:  Nuclear sclerotic cataract of the left eye.   OPERATIVE PROCEDURE:ORPROCALL@   SURGEON:  Birder Robson, MD.   ANESTHESIA:  Anesthesiologist: Martha Clan, MD CRNA: Candice Camp, CRNA  1.      Managed anesthesia care. 2.     0.40m of Shugarcaine was instilled following the paracentesis   COMPLICATIONS: Vision Blue was used to stain the anterior capsule due to very poor/ no visualization of the red reflex.    TECHNIQUE:   Stop and chop   DESCRIPTION OF PROCEDURE:  The patient was examined and consented in the preoperative holding area where the aforementioned topical anesthesia was applied to the left eye and then brought back to the Operating Room where the left eye was prepped and draped in the usual sterile ophthalmic fashion and a lid speculum was placed. A paracentesis was created with the side port blade and the anterior chamber was filled with viscoelastic. A near clear corneal incision was performed with the steel keratome. A continuous curvilinear capsulorrhexis was performed with a cystotome followed by the capsulorrhexis forceps. Hydrodissection and hydrodelineation were carried out with BSS on a blunt cannula. The lens was removed in a stop and chop  technique and the remaining cortical material was removed with the irrigation-aspiration handpiece. The capsular bag was inflated with viscoelastic and the Technis [id number] lens was placed in the capsular bag without complication. The remaining viscoelastic was removed from the eye with the irrigation-aspiration handpiece. The wounds were hydrated. The anterior chamber was flushed with BSS and the eye was inflated to physiologic pressure. 0.186mVigamox was placed in the anterior chamber. The wounds were found to be water tight. The eye was dressed with Combigan. The patient was given protective glasses to wear throughout the day and a  shield with which to sleep tonight. The patient was also given drops with which to begin a drop regimen today and will follow-up with me in one day. Implant Name Type Inv. Item Serial No. Manufacturer Lot No. LRB No. Used Action  LENS IOL TECNIS EYHANCE 23.5 - [id number] Lens LENS IOL TECNIS EYHANCE 23.5 [id number]  Left 1 Implanted    Procedure(s) with comments: CATARACT EXTRACTION PHACO AND INTRAOCULAR LENS PLACEMENT (IOC) LEFT (Left) - 11.46 1:14.3  Electronically signed: WiBirder Robson2/03/2022 11:31 AM

## 2022-07-31 NOTE — Anesthesia Postprocedure Evaluation (Signed)
Anesthesia Post Note  Patient: Kayla Chan  Procedure(s) Performed: CATARACT EXTRACTION PHACO AND INTRAOCULAR LENS PLACEMENT (IOC) LEFT (Left: Eye)  Patient location during evaluation: PACU Anesthesia Type: MAC Level of consciousness: awake and alert Pain management: pain level controlled Vital Signs Assessment: post-procedure vital signs reviewed and stable Respiratory status: spontaneous breathing, nonlabored ventilation, respiratory function stable and patient connected to nasal cannula oxygen Cardiovascular status: stable and blood pressure returned to baseline Postop Assessment: no apparent nausea or vomiting Anesthetic complications: no   No notable events documented.   Last Vitals:  Vitals:   07/31/22 1139 07/31/22 1140  BP:    Pulse: 68 73  Resp: 20 (!) 23  Temp:    SpO2: 97% 96%    Last Pain:  Vitals:   07/31/22 1139  TempSrc:   PainSc: 0-No pain                 Martha Clan

## 2022-07-31 NOTE — Anesthesia Preprocedure Evaluation (Signed)
Anesthesia Evaluation  Patient identified by MRN, date of birth, ID band Patient awake    Reviewed: Allergy & Precautions, H&P , NPO status , Patient's Chart, lab work & pertinent test results, reviewed documented beta blocker date and time   History of Anesthesia Complications Negative for: history of anesthetic complications  Airway Mallampati: IV  TM Distance: >3 FB Neck ROM: full    Dental no notable dental hx. (+) Dental Advidsory Given, Teeth Intact   Pulmonary neg pulmonary ROS   Pulmonary exam normal breath sounds clear to auscultation       Cardiovascular Exercise Tolerance: Good hypertension, (-) angina (-) Past MI and (-) Cardiac Stents Normal cardiovascular exam(-) dysrhythmias (-) Valvular Problems/Murmurs Rhythm:regular Rate:Normal     Neuro/Psych negative neurological ROS  negative psych ROS   GI/Hepatic negative GI ROS, Neg liver ROS,,,  Endo/Other  neg diabetesHypothyroidism  Morbid obesity  Renal/GU CRFRenal disease  negative genitourinary   Musculoskeletal   Abdominal   Peds  Hematology negative hematology ROS (+)   Anesthesia Other Findings Past Medical History: No date: Cancer ([place])     Comment:  BCC on nose No date: Carotid artery occlusion No date: CHF (congestive heart failure) ([facility]) No date: Chronic kidney disease     Comment:  stage III No date: Gout No date: Hyperlipidemia No date: Hypertension No date: Hypothyroidism No date: Lymphedema No date: Wears hearing aid in both ears   Reproductive/Obstetrics negative OB ROS                             Anesthesia Physical Anesthesia Plan  ASA: 3  Anesthesia Plan: MAC   Post-op Pain Management:    Induction: Intravenous  PONV Risk Score and Plan: 2 and Midazolam and Treatment may vary due to age or medical condition  Airway Management Planned: Natural Airway and Nasal Cannula  Additional Equipment:    Intra-op Plan:   Post-operative Plan:   Informed Consent: I have reviewed the patients History and Physical, chart, labs and discussed the procedure including the risks, benefits and alternatives for the proposed anesthesia with the patient or authorized representative who has indicated his/her understanding and acceptance.     Dental Advisory Given  Plan Discussed with: Anesthesiologist, CRNA and Surgeon  Anesthesia Plan Comments:        Anesthesia Quick Evaluation

## 2022-07-31 NOTE — H&P (Signed)
[facility]   Primary Care Physician:  Leonel Ramsay, MD Ophthalmologist: Dr. Hortense Ramal  Pre-Procedure History & Physical: HPI:  Kayla Chan is a 88 y.o. female here for cataract surgery.   Past Medical History:  Diagnosis Date   Cancer ([place])    BCC on nose   Carotid artery occlusion    CHF (congestive heart failure) ([facility])    Chronic kidney disease    stage III   Gout    Hyperlipidemia    Hypertension    Hypothyroidism    Lymphedema    Wears hearing aid in both ears     Past Surgical History:  Procedure Laterality Date   24 hour Holter Monitor  01/2013   4550 ectopic beats with 344 superventricular bigemminy events   Abdominal Ultrasound  05/31/2013   RUQ. Fatty Liver   APPENDECTOMY  1950   [place]   REPLACEMENT TOTAL KNEE  09/10/2011   Inpatient, [place], [place]; Right    Prior to Admission medications   Medication Sig Start Date End Date Taking? Authorizing Provider  acetaminophen (TYLENOL) 325 MG tablet Take 2 tablets (650 mg total) by mouth every 6 (six) hours as needed for mild pain (or Fever >/= 101). 12/05/20  Yes Wieting, Richard, MD  Ascorbic Acid (VITAMIN C PO) Take by mouth daily.   Yes [provider]  aspirin 81 MG tablet Take 81 mg by mouth daily.   Yes [provider]  ezetimibe (ZETIA) 10 MG tablet Take 1 tablet (10 mg total) by mouth daily. 04/21/21  Yes Birdie Sons, MD  levothyroxine (SYNTHROID) 75 MCG tablet TAKE 1 TABLET BY MOUTH EVERY DAY 01/26/21  Yes Birdie Sons, MD  lisinopril (ZESTRIL) 5 MG tablet Take 2.5 mg by mouth daily as needed.   Yes [provider]  Multiple Vitamin (MULTIVITAMIN) tablet Take 1 tablet by mouth daily.   Yes [provider]  chlorthalidone (HYGROTON) 25 MG tablet Take 0.5 tablets (12.5 mg total) by mouth daily. Patient not taking: Reported on 07/28/2022 03/25/21   Birdie Sons, MD  cloNIDine (CATAPRES) 0.1 MG tablet Take by mouth. Patient not taking:  Reported on 07/28/2022 06/16/22 06/16/23  [provider]  cyanocobalamin (VITAMIN B12) 1000 MCG tablet Take by mouth. Patient not taking: Reported on 07/28/2022    [provider]    Allergies as of 06/05/2022 - Review Complete 04/30/2022  Allergen Reaction Noted   Lisinopril  01/10/2020   Amlodipine  03/07/2018   Atorvastatin  06/28/2015   Bactrim [sulfamethoxazole-trimethoprim] Itching 12/18/2020   Colestid  [colestipol hcl]  06/28/2015   Colestipol Other (See Comments) 01/29/2021   Crestor  [rosuvastatin calcium]  06/28/2015   Doxycycline  06/28/2015   Fluvastatin sodium  06/28/2015   Furosemide  06/28/2015   Labetalol Other (See Comments) 01/29/2021   Losartan  05/21/2020   Pravastatin sodium  06/28/2015   Verapamil  06/10/2017   Allopurinol Other (See Comments) 09/21/2018   Penicillins Rash 06/28/2015    Family History  Problem Relation Age of Onset   Stroke Mother        possible stroke   Heart Problems Father    Kidney cancer Sister    Bladder Cancer Brother     Social History   Socioeconomic History   Marital status: Married    Spouse name: Not on file   Number of children: 0   Years of education: Not on file   Highest education level: Some college, no degree  Occupational  History   Occupation: Retired  Tobacco Use   Smoking status: Never   Smokeless tobacco: Never  Vaping Use   Vaping Use: Never used  Substance and Sexual Activity   Alcohol use: Not Currently    Comment: 1 glass of wine seldom   Drug use: No   Sexual activity: Not on file  Other Topics Concern   Not on file  Social History Narrative   Not on file   Social Determinants of Health   Financial Resource Strain: Low Risk  (10/02/2020)   Overall Financial Resource Strain (CARDIA)    Difficulty of Paying Living Expenses: Not hard at all  Food Insecurity: No Food Insecurity (10/02/2020)   Hunger Vital Sign    Worried About Running Out of Food in the Last Year: Never true     [place] in the Last Year: Never true  Transportation Needs: No Transportation Needs (10/02/2020)   PRAPARE - Hydrologist (Medical): No    Lack of Transportation (Non-Medical): No  Physical Activity: Inactive (10/02/2020)   Exercise Vital Sign    Days of Exercise per Week: 0 days    Minutes of Exercise per Session: 0 min  Stress: No Stress Concern Present (10/02/2020)   [place]    Feeling of Stress : Not at all  Social Connections: Moderately Integrated (10/02/2020)   Social Connection and Isolation Panel [NHANES]    Frequency of Communication with Friends and Family: More than three times a week    Frequency of Social Gatherings with Friends and Family: More than three times a week    Attends Religious Services: More than 4 times per year    Active Member of [employer] or Organizations: No    Attends Archivist Meetings: Never    Marital Status: Married  Human resources officer Violence: Not At Risk (10/02/2020)   Humiliation, Afraid, Rape, and Kick questionnaire    Fear of Current or Ex-Partner: No    Emotionally Abused: No    Physically Abused: No    Sexually Abused: No    Review of Systems: See HPI, otherwise negative ROS  Physical Exam: BP (!) 193/82   Pulse 86   Temp 98.1 F (36.7 C) (Temporal)   Resp 16   Ht 5' (1.524 m)   Wt 106.6 kg   SpO2 96%   BMI 45.90 kg/m  General:   Alert, cooperative in NAD Head:  Normocephalic and atraumatic. Respiratory:  Normal work of breathing. Cardiovascular:  RRR  Impression/Plan: Kayla Chan is here for cataract surgery.  Risks, benefits, limitations, and alternatives regarding cataract surgery have been reviewed with the patient.  Questions have been answered.  All parties agreeable.   Birder Robson, MD  07/31/2022, 10:46 AM

## 2022-07-31 NOTE — Transfer of Care (Signed)
Immediate Anesthesia Transfer of Care Note  Patient: Kayla Chan  Procedure(s) Performed: CATARACT EXTRACTION PHACO AND INTRAOCULAR LENS PLACEMENT (IOC) LEFT (Left: Eye)  Patient Location: PACU  Anesthesia Type: MAC  Level of Consciousness: awake, alert  and patient cooperative  Airway and Oxygen Therapy: Patient Spontanous Breathing and Patient connected to supplemental oxygen  Post-op Assessment: Post-op Vital signs reviewed, Patient's Cardiovascular Status Stable, Respiratory Function Stable, Patent Airway and No signs of Nausea or vomiting  Post-op Vital Signs: Reviewed and stable  Complications: No notable events documented.

## 2022-08-03 ENCOUNTER — Encounter: Payer: Self-pay | Admitting: Ophthalmology

## 2022-08-06 DIAGNOSIS — I10 Essential (primary) hypertension: Secondary | ICD-10-CM | POA: Diagnosis not present

## 2022-08-06 DIAGNOSIS — R0602 Shortness of breath: Secondary | ICD-10-CM | POA: Diagnosis not present

## 2022-08-06 DIAGNOSIS — R300 Dysuria: Secondary | ICD-10-CM | POA: Diagnosis not present

## 2022-08-25 ENCOUNTER — Other Ambulatory Visit: Payer: Self-pay | Admitting: Infectious Diseases

## 2022-08-25 DIAGNOSIS — I10 Essential (primary) hypertension: Secondary | ICD-10-CM | POA: Diagnosis not present

## 2022-08-25 DIAGNOSIS — R42 Dizziness and giddiness: Secondary | ICD-10-CM

## 2022-09-07 ENCOUNTER — Ambulatory Visit
Admission: RE | Admit: 2022-09-07 | Discharge: 2022-09-07 | Disposition: A | Discharge status: Home / Self Care | Payer: PPO | Source: Ambulatory Visit | Attending: Infectious Diseases | Admitting: Infectious Diseases

## 2022-09-07 DIAGNOSIS — I10 Essential (primary) hypertension: Secondary | ICD-10-CM | POA: Diagnosis not present

## 2022-09-07 DIAGNOSIS — I6622 Occlusion and stenosis of left posterior cerebral artery: Secondary | ICD-10-CM | POA: Diagnosis not present

## 2022-09-07 DIAGNOSIS — R42 Dizziness and giddiness: Secondary | ICD-10-CM | POA: Diagnosis not present

## 2022-09-07 DIAGNOSIS — I672 Cerebral atherosclerosis: Secondary | ICD-10-CM | POA: Diagnosis not present

## 2022-09-07 DIAGNOSIS — I6501 Occlusion and stenosis of right vertebral artery: Secondary | ICD-10-CM | POA: Diagnosis not present

## 2022-09-07 DIAGNOSIS — I6612 Occlusion and stenosis of left anterior cerebral artery: Secondary | ICD-10-CM | POA: Diagnosis not present

## 2022-09-07 DIAGNOSIS — I651 Occlusion and stenosis of basilar artery: Secondary | ICD-10-CM | POA: Diagnosis not present

## 2022-09-14 DIAGNOSIS — I10 Essential (primary) hypertension: Secondary | ICD-10-CM | POA: Diagnosis not present

## 2022-09-14 DIAGNOSIS — R42 Dizziness and giddiness: Secondary | ICD-10-CM | POA: Diagnosis not present

## 2022-09-29 ENCOUNTER — Other Ambulatory Visit: Payer: PPO

## 2022-10-01 DIAGNOSIS — J810 Acute pulmonary edema: Secondary | ICD-10-CM | POA: Diagnosis not present

## 2022-10-01 DIAGNOSIS — B028 Zoster with other complications: Secondary | ICD-10-CM | POA: Diagnosis not present

## 2022-10-01 DIAGNOSIS — I5032 Chronic diastolic (congestive) heart failure: Secondary | ICD-10-CM | POA: Diagnosis not present

## 2022-10-01 DIAGNOSIS — N1831 Chronic kidney disease, stage 3a: Secondary | ICD-10-CM | POA: Diagnosis not present

## 2022-10-06 DIAGNOSIS — I10 Essential (primary) hypertension: Secondary | ICD-10-CM | POA: Diagnosis not present

## 2022-10-08 DIAGNOSIS — R21 Rash and other nonspecific skin eruption: Secondary | ICD-10-CM | POA: Diagnosis not present

## 2022-10-08 DIAGNOSIS — L820 Inflamed seborrheic keratosis: Secondary | ICD-10-CM | POA: Diagnosis not present

## 2022-10-08 DIAGNOSIS — L814 Other melanin hyperpigmentation: Secondary | ICD-10-CM | POA: Diagnosis not present

## 2022-10-08 DIAGNOSIS — L821 Other seborrheic keratosis: Secondary | ICD-10-CM | POA: Diagnosis not present

## 2022-10-08 DIAGNOSIS — D229 Melanocytic nevi, unspecified: Secondary | ICD-10-CM | POA: Diagnosis not present

## 2022-10-08 DIAGNOSIS — L578 Other skin changes due to chronic exposure to nonionizing radiation: Secondary | ICD-10-CM | POA: Diagnosis not present

## 2022-10-20 DIAGNOSIS — B028 Zoster with other complications: Secondary | ICD-10-CM | POA: Diagnosis not present

## 2022-10-20 DIAGNOSIS — Z0000 Encounter for general adult medical examination without abnormal findings: Secondary | ICD-10-CM | POA: Diagnosis not present

## 2022-10-20 DIAGNOSIS — N1831 Chronic kidney disease, stage 3a: Secondary | ICD-10-CM | POA: Diagnosis not present

## 2022-10-20 DIAGNOSIS — I5032 Chronic diastolic (congestive) heart failure: Secondary | ICD-10-CM | POA: Diagnosis not present

## 2022-10-20 DIAGNOSIS — R42 Dizziness and giddiness: Secondary | ICD-10-CM | POA: Diagnosis not present

## 2022-10-20 DIAGNOSIS — I10 Essential (primary) hypertension: Secondary | ICD-10-CM | POA: Diagnosis not present

## 2022-10-29 DIAGNOSIS — I10 Essential (primary) hypertension: Secondary | ICD-10-CM | POA: Diagnosis not present

## 2022-11-12 IMAGING — US US EXTREM LOW VENOUS*R*
1 series · 14 of 24 positions shown · non-contrast
Comparison: None.

CLINICAL DATA: Right leg pain and swelling

EXAM:
RIGHT LOWER EXTREMITY VENOUS DOPPLER ULTRASOUND
TECHNIQUE: Gray-scale sonography with compression, as well as color and duplex
ultrasound, were performed to evaluate the deep venous system(s)
from the level of the common femoral vein through the popliteal and
proximal calf veins.

[Series 1: us venous img lower uni right (dvt) · portal-venous · 14 of 34 slices shown]
[im 1/34]
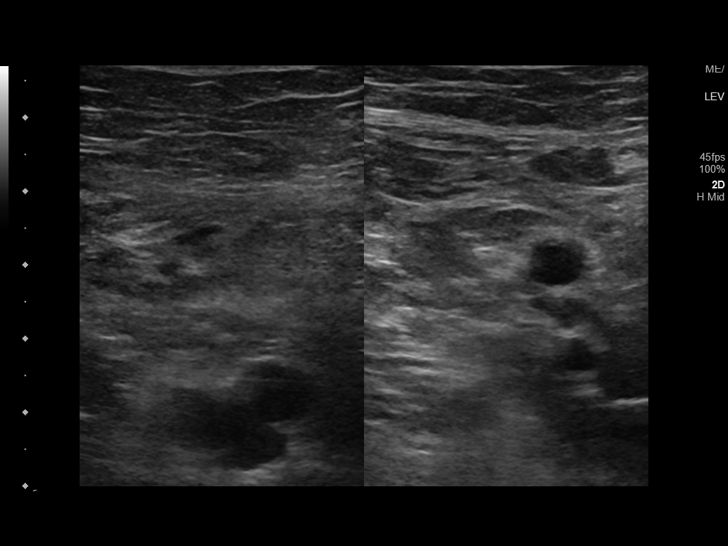
[im 3/34]
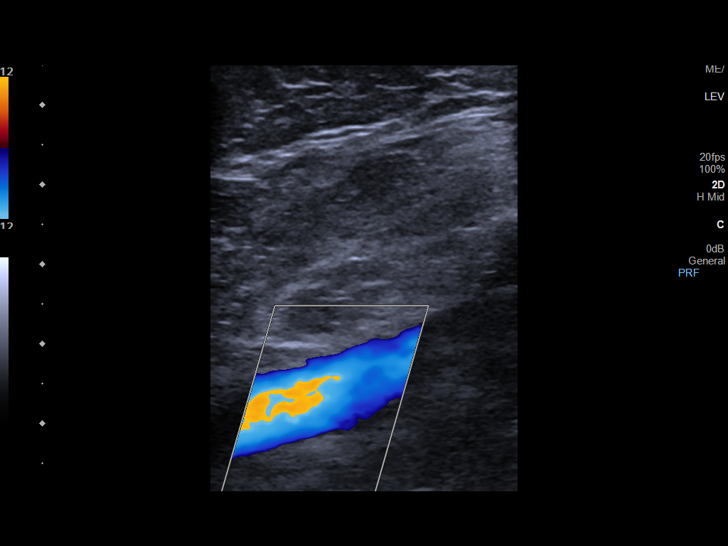
[im 6/34]
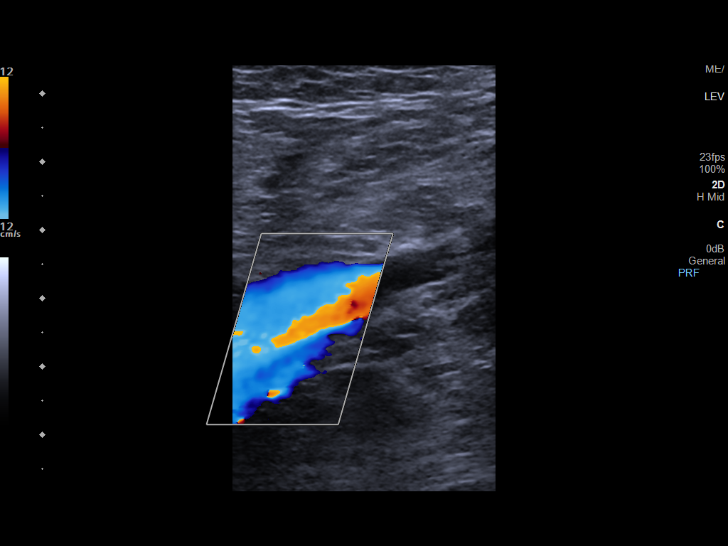
[im 9/34]
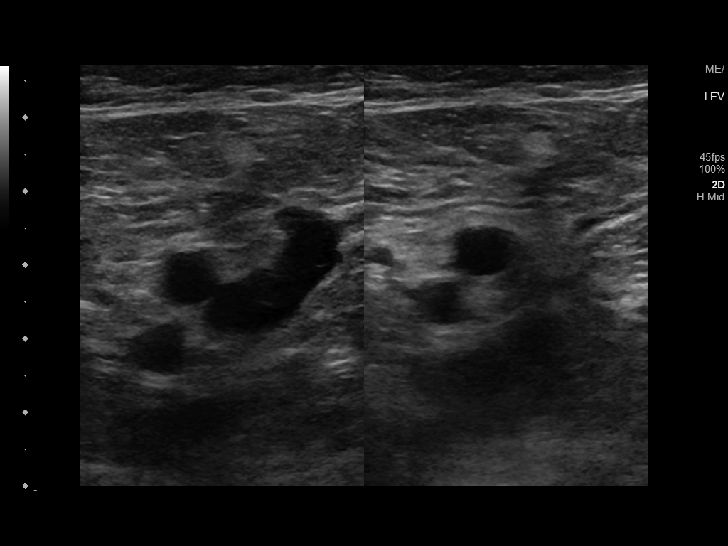
[im 11/34]
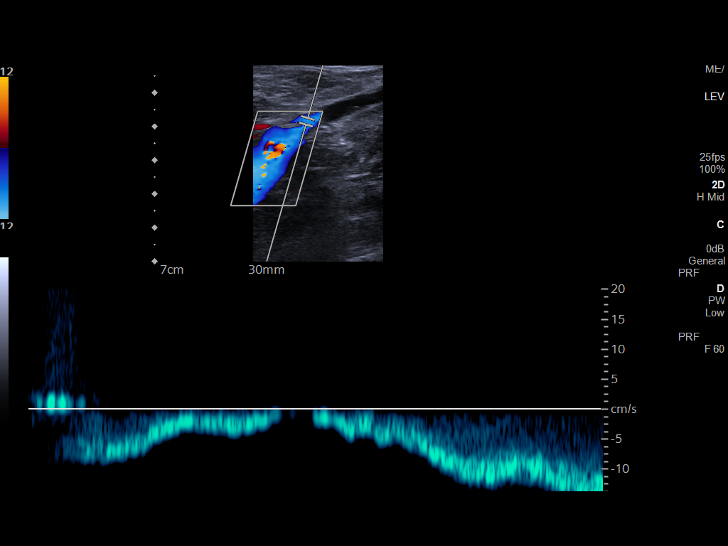
[im 13/34]
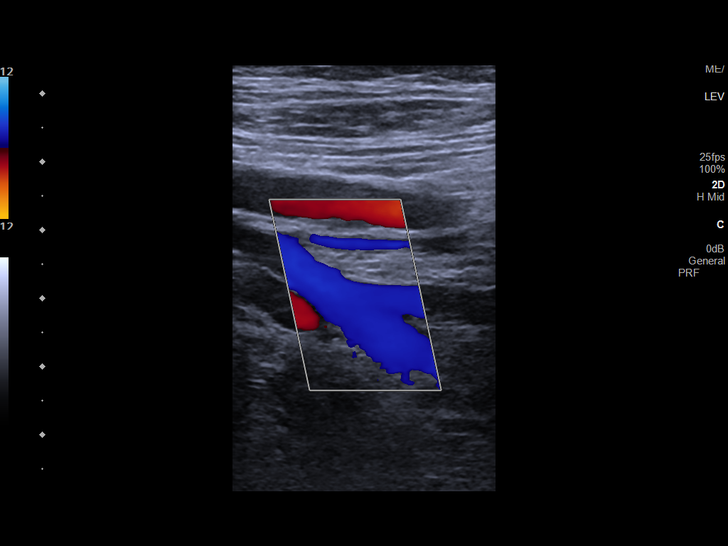
[im 16/34]
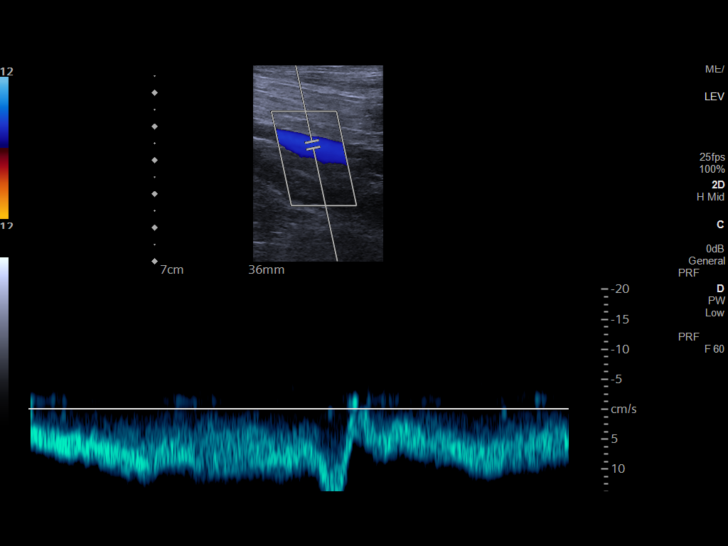
[im 18/34]
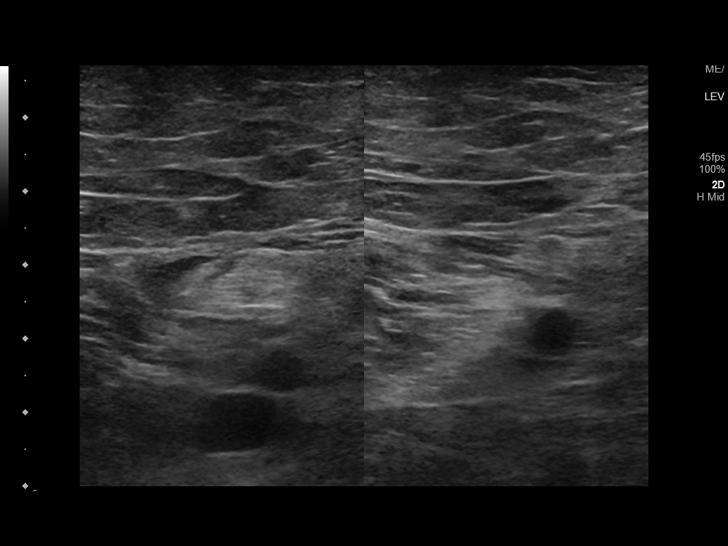
[im 21/34]
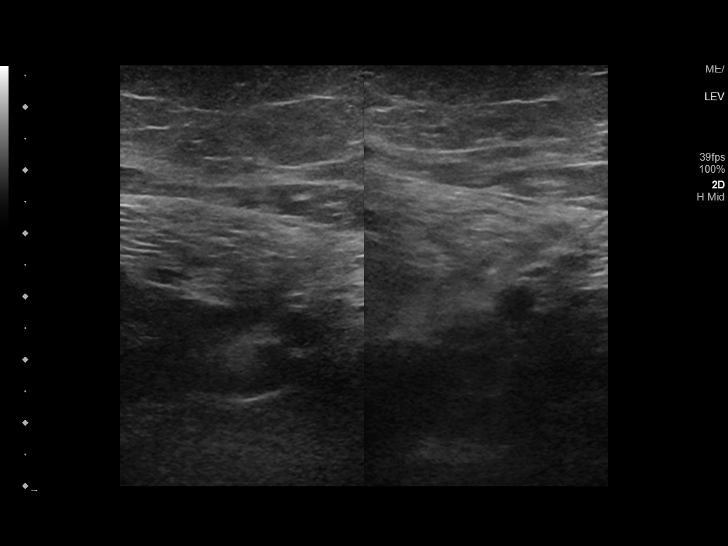
[im 23/34]
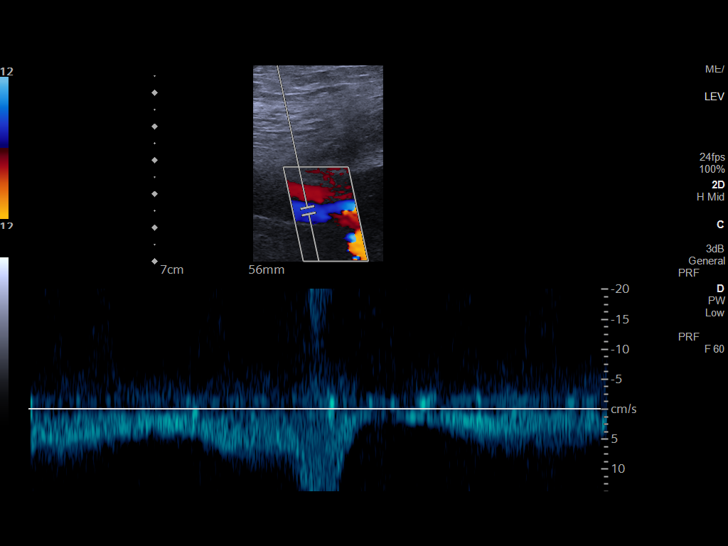
[im 26/34]
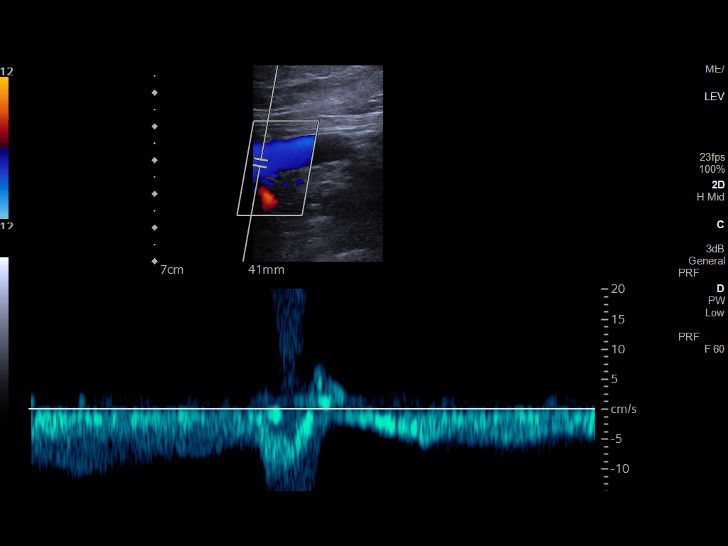
[im 28/34]
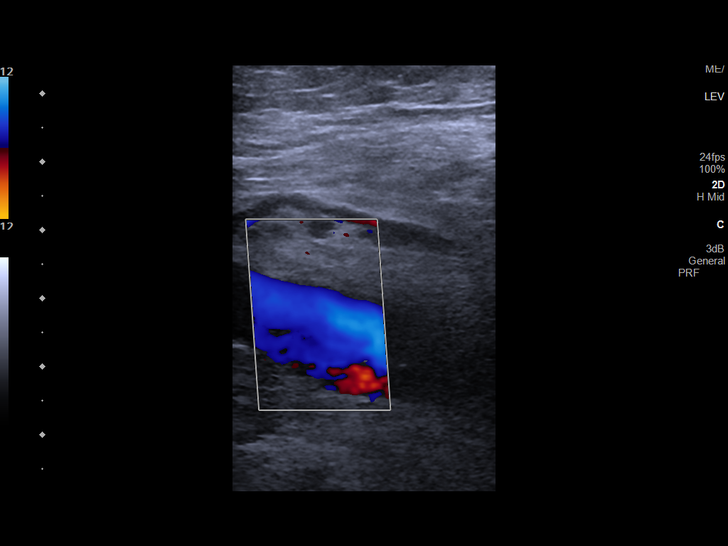
[im 31/34]
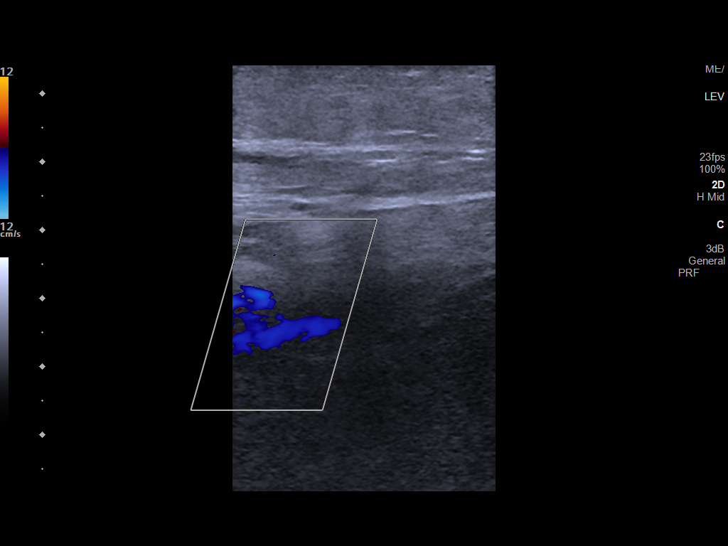
[im 34/34]
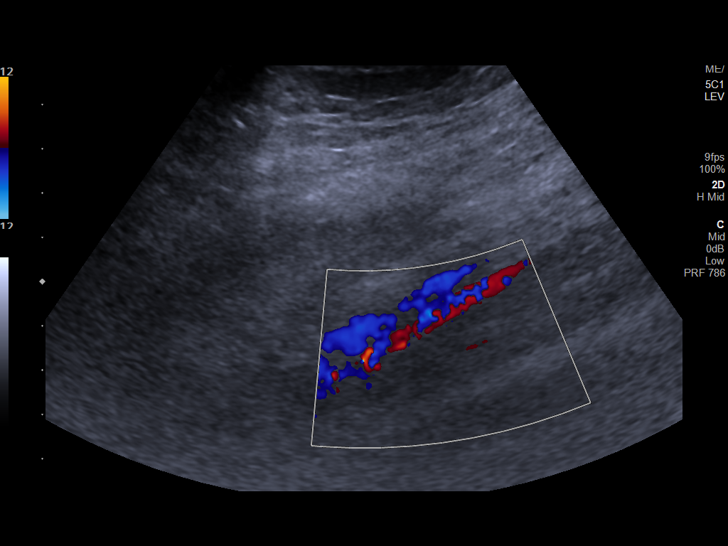

[14 of 24 positions shown; findings below may reference images not displayed]

FINDINGS: VENOUS

Normal compressibility of the common femoral, superficial femoral,
and popliteal veins, as well as the visualized calf veins.
Visualized portions of profunda femoral vein and great saphenous
vein unremarkable. No filling defects to suggest DVT on grayscale or
color Doppler imaging. Doppler waveforms show normal direction of
venous flow, normal respiratory plasticity and response to
augmentation.

Limited views of the contralateral common femoral vein are
unremarkable.

OTHER

None.

Limitations: none
IMPRESSION: Negative examination for deep venous thrombosis in the right lower
extremity.

## 2022-11-16 IMAGING — US US EXTREM LOW VENOUS*R*
1 series · 13 of 24 positions shown · non-contrast
Comparison: 11/27/2020

CLINICAL DATA: 88-year-old female with right lower extremity
swelling.

EXAM:
RIGHT LOWER EXTREMITY VENOUS DOPPLER ULTRASOUND
TECHNIQUE: Gray-scale sonography with graded compression, as well as color
Doppler and duplex ultrasound were performed to evaluate the right
lower extremity deep venous systems from the level of the common
femoral vein and including the common femoral, femoral, profunda
femoral, popliteal and calf veins including the posterior tibial,
peroneal and gastrocnemius veins when visible. Spectral Doppler was
utilized to evaluate flow at rest and with distal augmentation
maneuvers in the common femoral, femoral and popliteal veins. The
contralateral common femoral vein was also evaluated for comparison.

[Series 1: us venous img lower uni right (dvt) · portal-venous · 49 acquisitions, 13 frames shown]
[im 1/49]
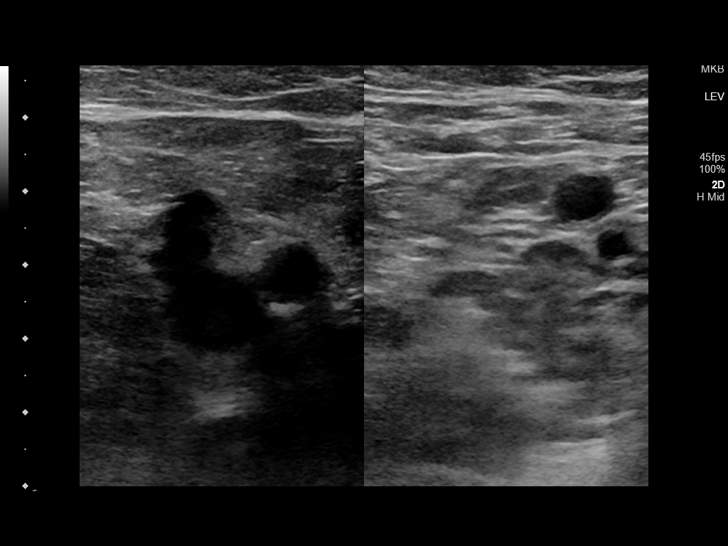
[im 5/49]
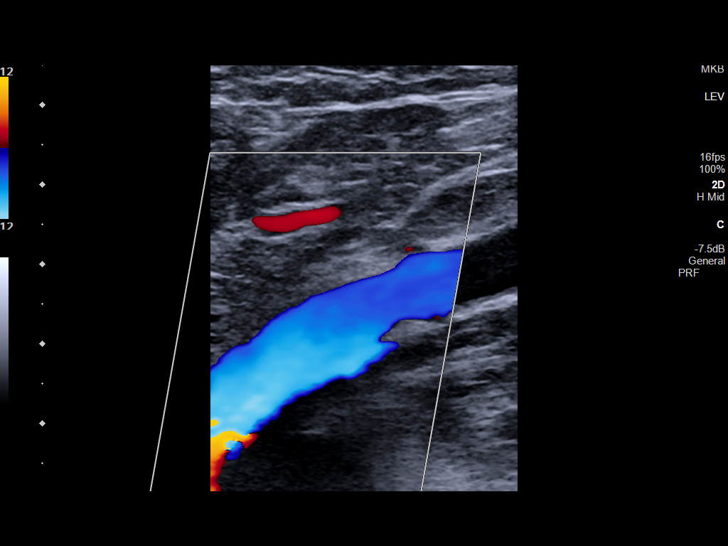
[im 9/49]
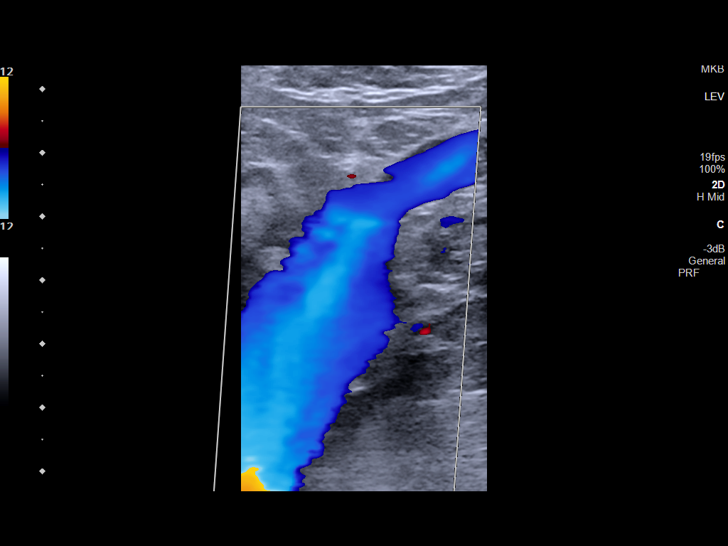
[im 13/49]
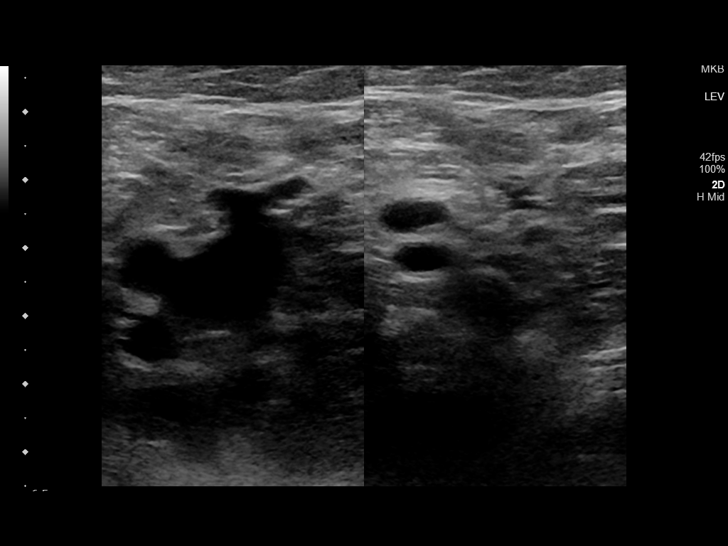
[im 17/49]
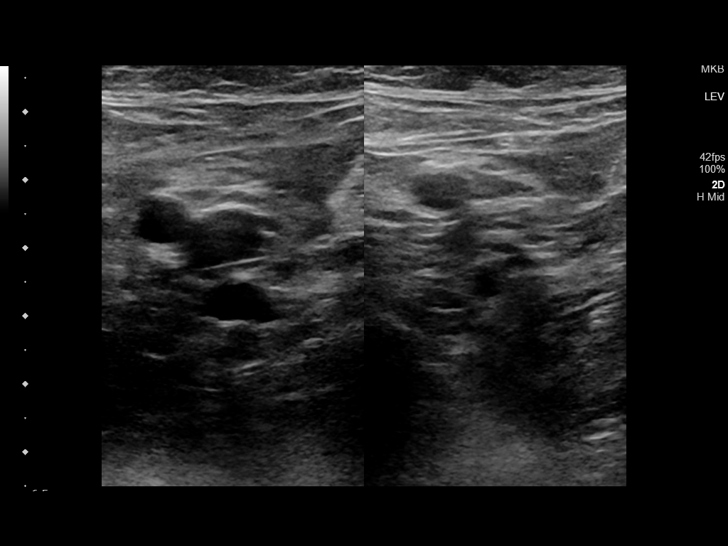
[im 21/49]
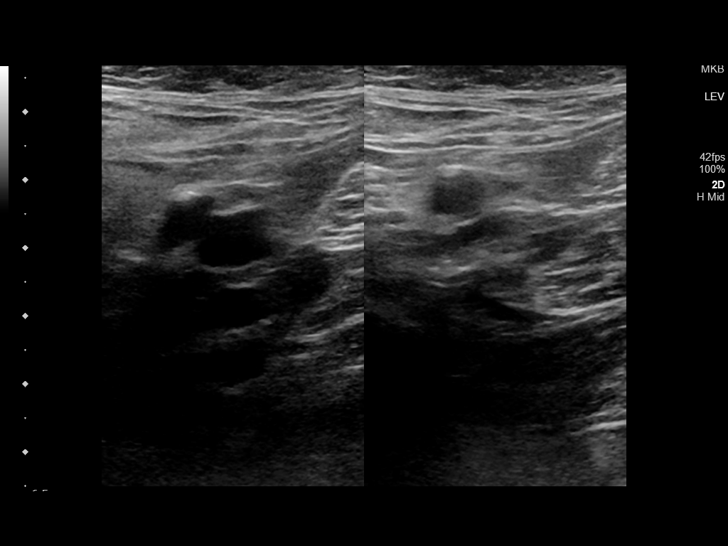
[im 28/49]
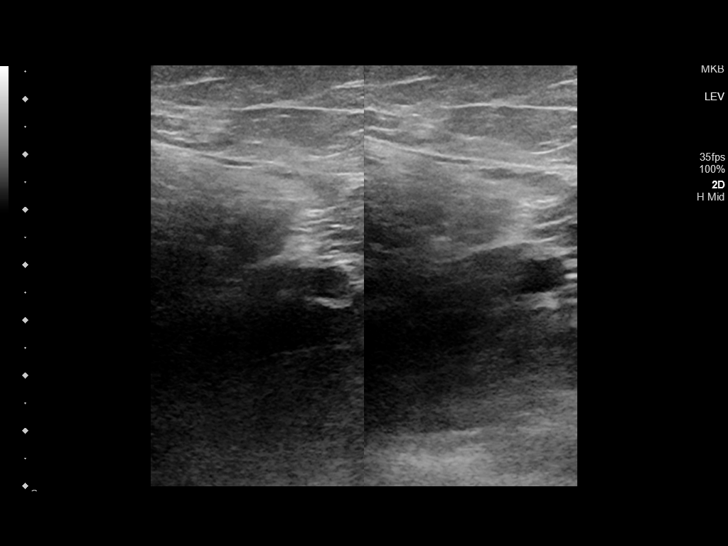
[im 30/49]
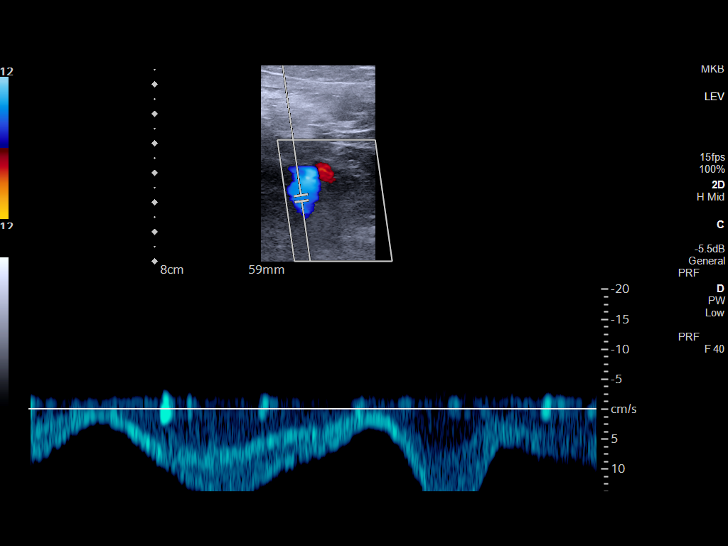
[im 34/49]
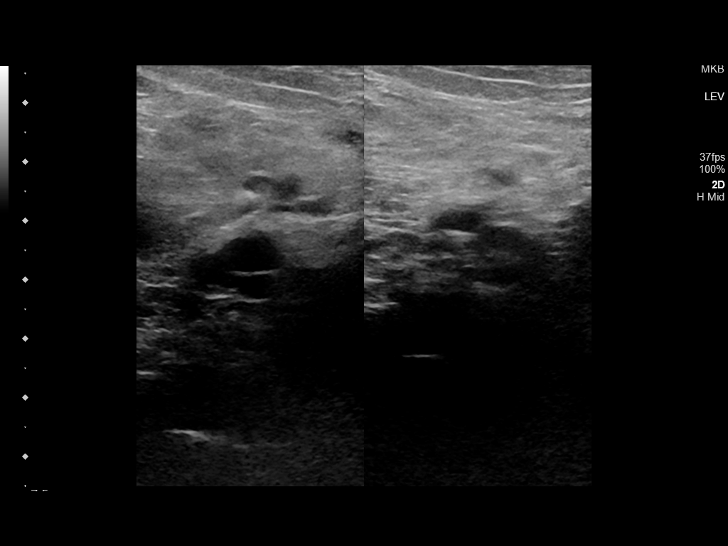
[im 38/49]
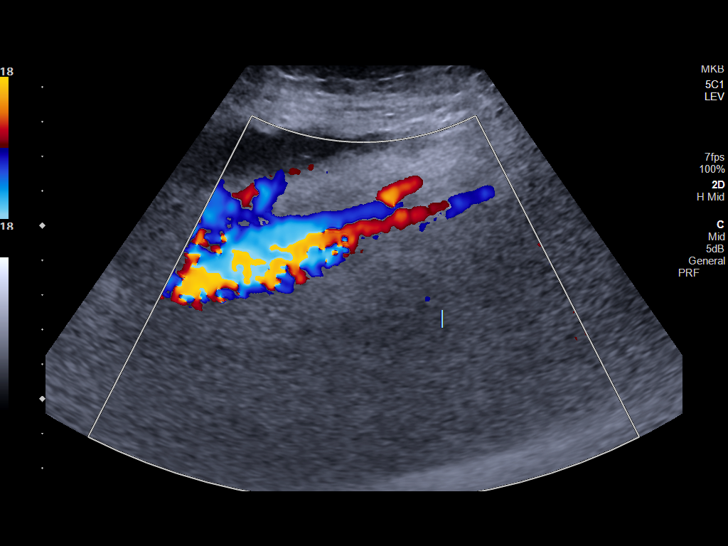
[im 42/49]
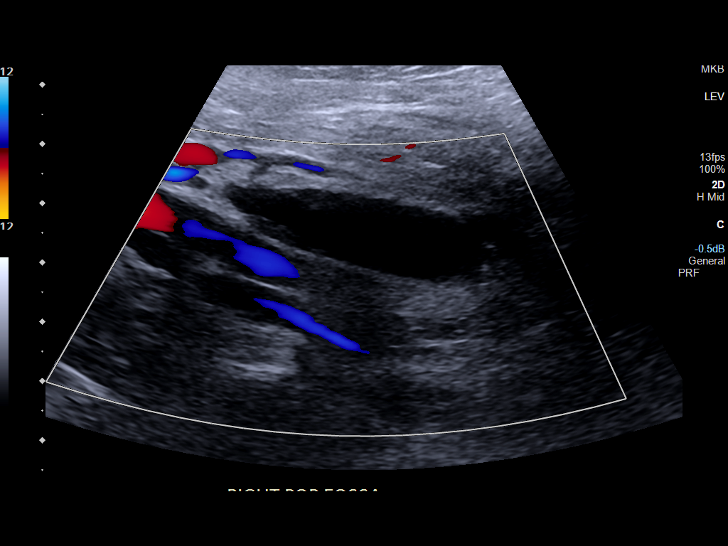
[im 46/49]
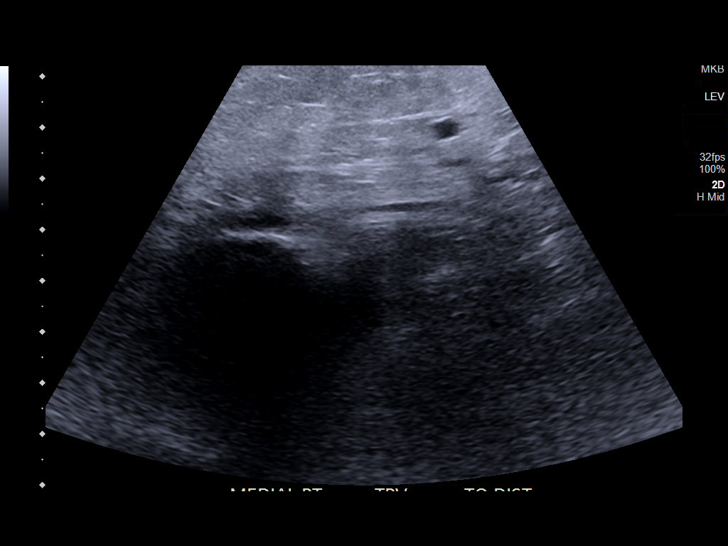
[im 49/49]
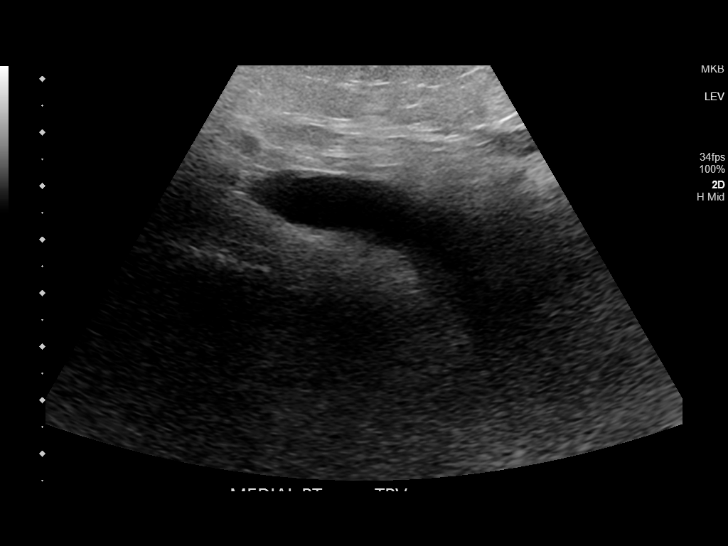

[13 of 24 positions shown; findings below may reference images not displayed]

FINDINGS: RIGHT LOWER EXTREMITY

Common Femoral Vein: No evidence of thrombus. Normal
compressibility, respiratory phasicity and response to augmentation.

Central Greater Saphenous Vein: No evidence of thrombus. Normal
compressibility and flow on color Doppler imaging.

Central Profunda Femoral Vein: No evidence of thrombus. Normal
compressibility and flow on color Doppler imaging.

Femoral Vein: No evidence of thrombus. Normal compressibility,
respiratory phasicity and response to augmentation.

Popliteal Vein: No evidence of thrombus. Normal compressibility,
respiratory phasicity and response to augmentation.

Calf Veins: No evidence of thrombus. Normal compressibility and flow
on color Doppler imaging.

Other Findings: Simple appearing fluid collection in the right
popliteal fossa measuring approximately 6.1 x 1.3 x 1.6 cm with
ill-defined extension into the medial aspect of the calf.

LEFT LOWER EXTREMITY

Common Femoral Vein: No evidence of thrombus. Normal
compressibility, respiratory phasicity and response to augmentation.
IMPRESSION: 1. No evidence of right lower extremity deep vein thrombosis.
2. Simple appearing fluid collection right popliteal fossa measuring
up to approximately 6.1 cm with extension into the right medial
calf. These findings are concerning for Baker's cyst with possible
rupture. Hematoma could appear similarly. Right lower extremity MRI
could be obtained for further characterization as clinically
indicated.

## 2022-11-16 IMAGING — DX DG ANKLE COMPLETE 3+V*R*
3 series · 3 of 3 positions shown · non-contrast
Comparison: Right lower extremity radiograph dated 12/26/2019.

CLINICAL DATA: 88-year-old female with right lower extremity
swelling hand pain.

EXAM:
RIGHT ANKLE - COMPLETE 3+ VIEW

[ankle ap]
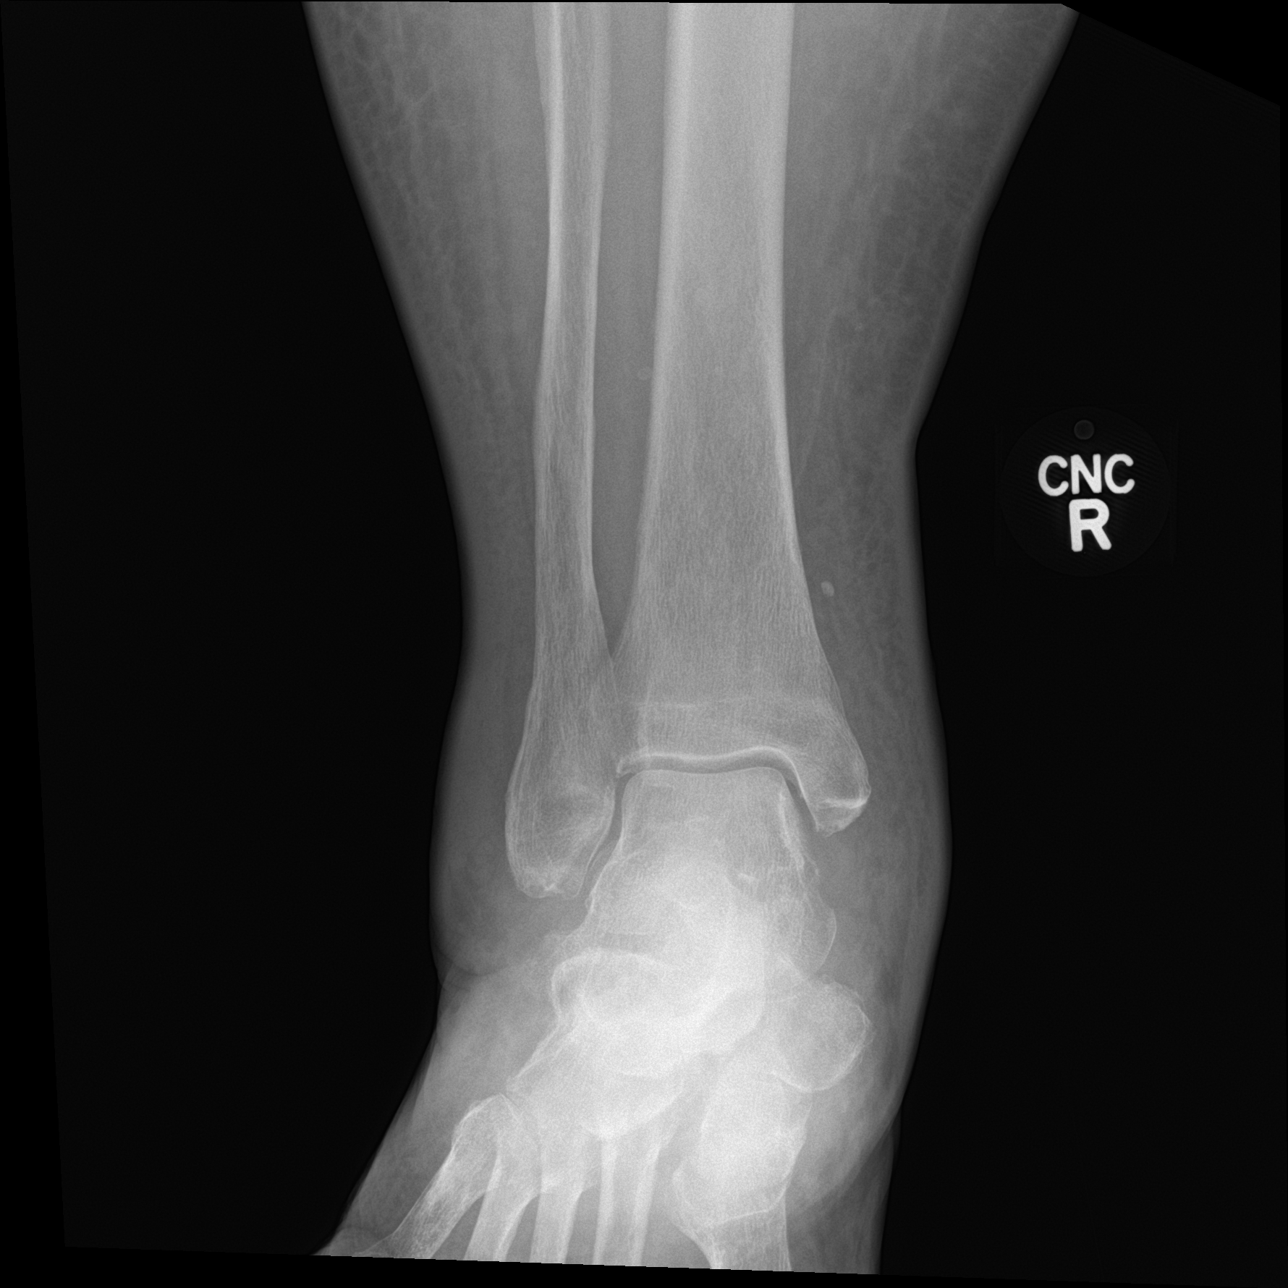

[ankle obl]
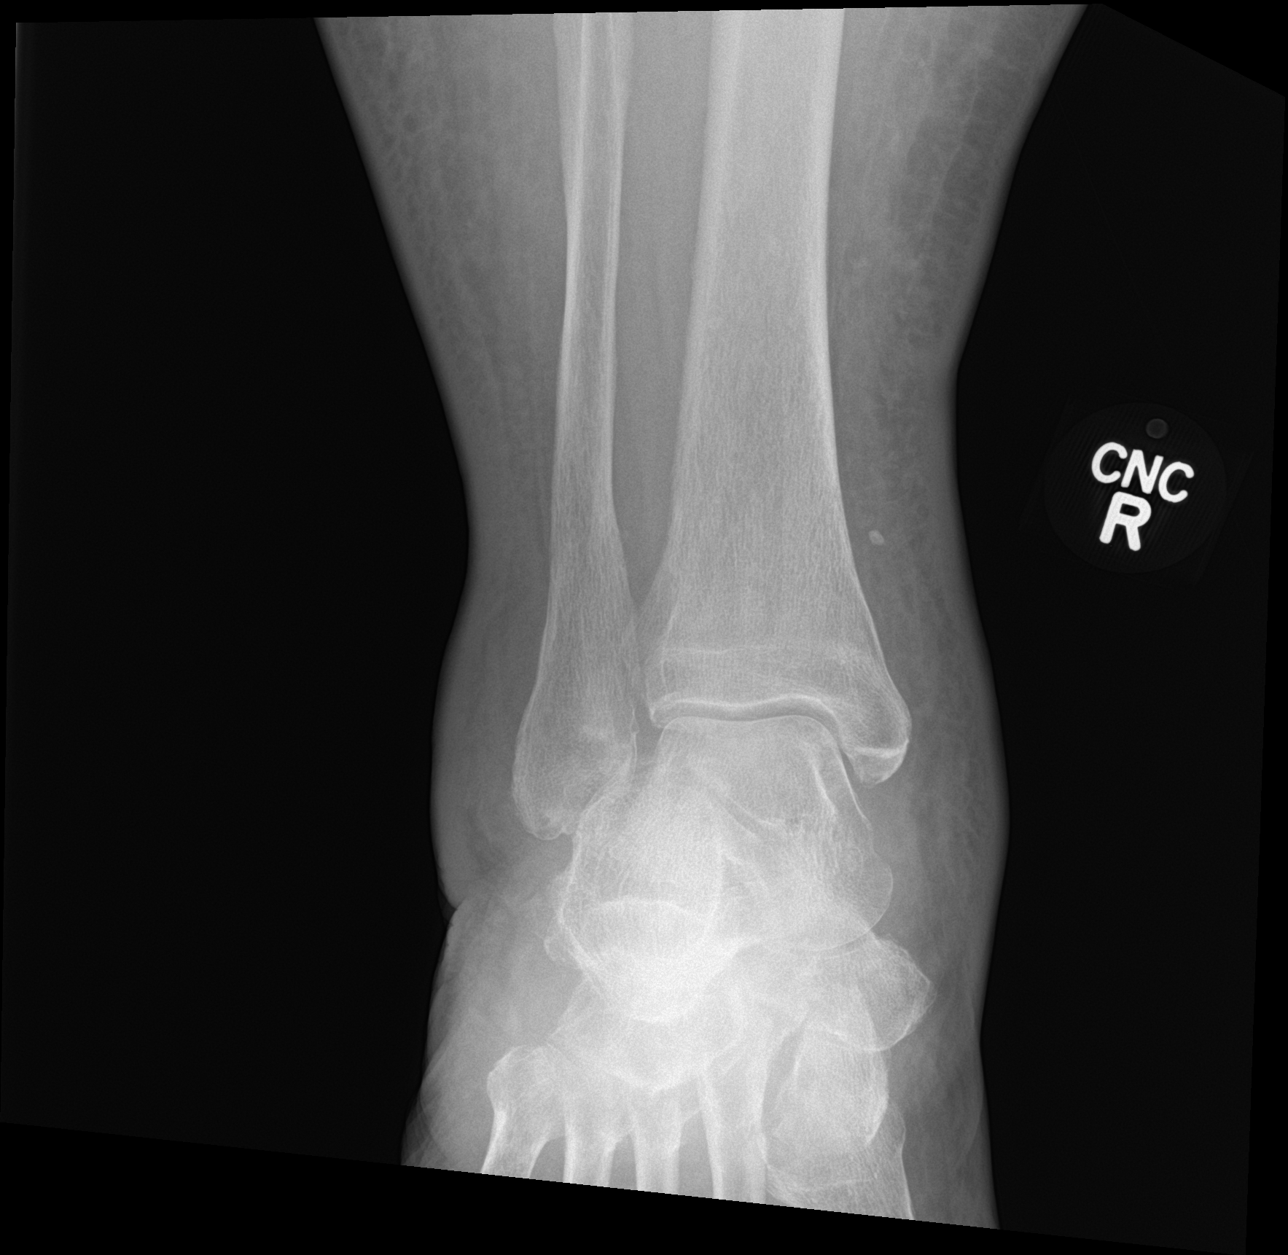

[ankle lat]
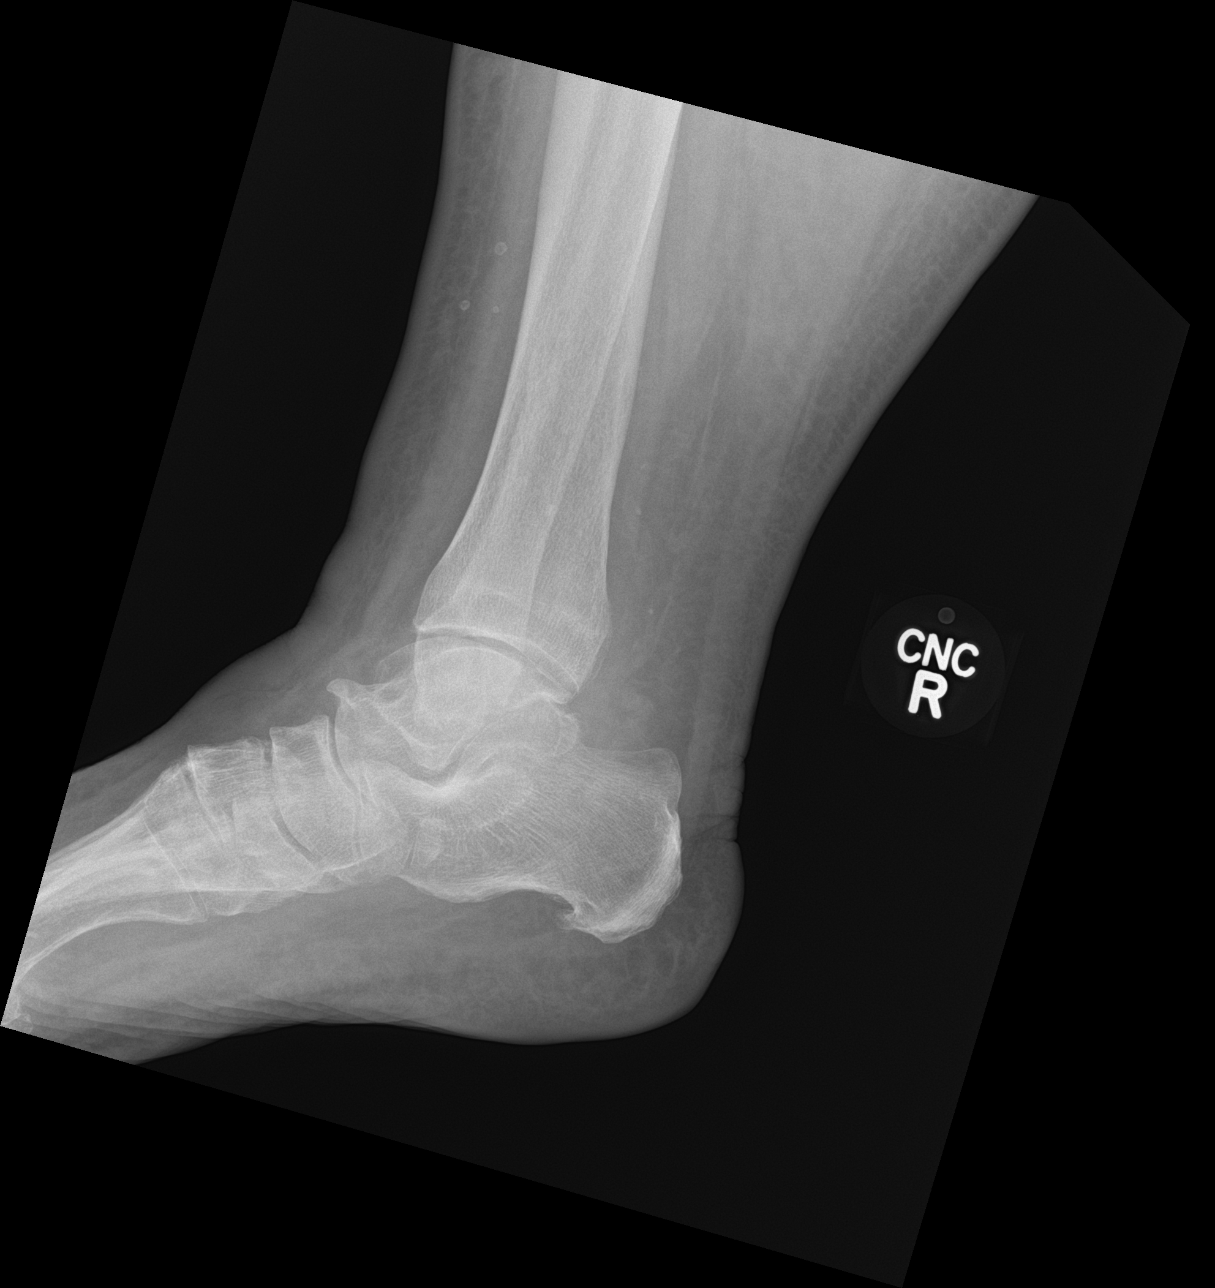

[3 of 3 positions shown; findings below may reference images not displayed]

FINDINGS: There is no acute fracture or dislocation. There is osteopenia. The
ankle mortise is intact. Mild spurring from the dorsal anterior
talus. There is diffuse skin thickening and subcutaneous soft tissue
edema. No radiopaque foreign object or soft tissue gas.
IMPRESSION: 1. No acute fracture or dislocation.
2. Diffuse skin thickening and subcutaneous soft tissue edema.

## 2022-12-30 DIAGNOSIS — N1831 Chronic kidney disease, stage 3a: Secondary | ICD-10-CM | POA: Diagnosis not present

## 2022-12-30 DIAGNOSIS — R42 Dizziness and giddiness: Secondary | ICD-10-CM | POA: Diagnosis not present

## 2022-12-30 DIAGNOSIS — R109 Unspecified abdominal pain: Secondary | ICD-10-CM | POA: Diagnosis not present

## 2022-12-30 DIAGNOSIS — M109 Gout, unspecified: Secondary | ICD-10-CM | POA: Diagnosis not present

## 2022-12-30 DIAGNOSIS — M25552 Pain in left hip: Secondary | ICD-10-CM | POA: Diagnosis not present

## 2022-12-30 DIAGNOSIS — I10 Essential (primary) hypertension: Secondary | ICD-10-CM | POA: Diagnosis not present

## 2023-01-07 DIAGNOSIS — R109 Unspecified abdominal pain: Secondary | ICD-10-CM | POA: Diagnosis not present

## 2023-01-07 DIAGNOSIS — M25552 Pain in left hip: Secondary | ICD-10-CM | POA: Diagnosis not present

## 2023-01-13 DIAGNOSIS — K5900 Constipation, unspecified: Secondary | ICD-10-CM | POA: Diagnosis not present

## 2023-01-13 DIAGNOSIS — R1012 Left upper quadrant pain: Secondary | ICD-10-CM | POA: Diagnosis not present

## 2023-02-02 DIAGNOSIS — R309 Painful micturition, unspecified: Secondary | ICD-10-CM | POA: Diagnosis not present

## 2023-02-03 DIAGNOSIS — R42 Dizziness and giddiness: Secondary | ICD-10-CM | POA: Diagnosis not present

## 2023-02-03 DIAGNOSIS — H903 Sensorineural hearing loss, bilateral: Secondary | ICD-10-CM | POA: Diagnosis not present

## 2023-02-12 DIAGNOSIS — R42 Dizziness and giddiness: Secondary | ICD-10-CM | POA: Diagnosis not present

## 2023-02-12 DIAGNOSIS — R9431 Abnormal electrocardiogram [ECG] [EKG]: Secondary | ICD-10-CM | POA: Diagnosis not present

## 2023-02-12 DIAGNOSIS — I6523 Occlusion and stenosis of bilateral carotid arteries: Secondary | ICD-10-CM | POA: Diagnosis not present

## 2023-02-12 DIAGNOSIS — R600 Localized edema: Secondary | ICD-10-CM | POA: Diagnosis not present

## 2023-02-12 DIAGNOSIS — N183 Chronic kidney disease, stage 3 unspecified: Secondary | ICD-10-CM | POA: Diagnosis not present

## 2023-02-12 DIAGNOSIS — I10 Essential (primary) hypertension: Secondary | ICD-10-CM | POA: Diagnosis not present

## 2023-02-12 DIAGNOSIS — R0602 Shortness of breath: Secondary | ICD-10-CM | POA: Diagnosis not present

## 2023-02-12 DIAGNOSIS — E785 Hyperlipidemia, unspecified: Secondary | ICD-10-CM | POA: Diagnosis not present

## 2023-02-16 DIAGNOSIS — R109 Unspecified abdominal pain: Secondary | ICD-10-CM | POA: Diagnosis not present

## 2023-02-16 DIAGNOSIS — K5900 Constipation, unspecified: Secondary | ICD-10-CM | POA: Diagnosis not present

## 2023-02-16 DIAGNOSIS — E039 Hypothyroidism, unspecified: Secondary | ICD-10-CM | POA: Diagnosis not present

## 2023-02-16 DIAGNOSIS — R1084 Generalized abdominal pain: Secondary | ICD-10-CM | POA: Diagnosis not present

## 2023-02-19 ENCOUNTER — Other Ambulatory Visit: Payer: Self-pay | Admitting: Internal Medicine

## 2023-02-19 DIAGNOSIS — R42 Dizziness and giddiness: Secondary | ICD-10-CM

## 2023-02-19 DIAGNOSIS — R0602 Shortness of breath: Secondary | ICD-10-CM

## 2023-02-22 DIAGNOSIS — R42 Dizziness and giddiness: Secondary | ICD-10-CM | POA: Diagnosis not present

## 2023-02-22 DIAGNOSIS — E039 Hypothyroidism, unspecified: Secondary | ICD-10-CM | POA: Diagnosis not present

## 2023-02-22 DIAGNOSIS — R1084 Generalized abdominal pain: Secondary | ICD-10-CM | POA: Diagnosis not present

## 2023-02-22 DIAGNOSIS — K5900 Constipation, unspecified: Secondary | ICD-10-CM | POA: Diagnosis not present

## 2023-03-02 DIAGNOSIS — R1900 Intra-abdominal and pelvic swelling, mass and lump, unspecified site: Secondary | ICD-10-CM | POA: Diagnosis not present

## 2023-03-02 DIAGNOSIS — I130 Hypertensive heart and chronic kidney disease with heart failure and stage 1 through stage 4 chronic kidney disease, or unspecified chronic kidney disease: Secondary | ICD-10-CM | POA: Diagnosis not present

## 2023-03-02 DIAGNOSIS — K5900 Constipation, unspecified: Secondary | ICD-10-CM | POA: Insufficient documentation

## 2023-03-02 DIAGNOSIS — I509 Heart failure, unspecified: Secondary | ICD-10-CM | POA: Diagnosis not present

## 2023-03-02 DIAGNOSIS — N189 Chronic kidney disease, unspecified: Secondary | ICD-10-CM | POA: Diagnosis not present

## 2023-03-02 DIAGNOSIS — K5730 Diverticulosis of large intestine without perforation or abscess without bleeding: Secondary | ICD-10-CM | POA: Diagnosis not present

## 2023-03-02 DIAGNOSIS — R9431 Abnormal electrocardiogram [ECG] [EKG]: Secondary | ICD-10-CM | POA: Diagnosis not present

## 2023-03-02 LAB — COMPREHENSIVE METABOLIC PANEL
ALT: 10 U/L (ref 0–44)
AST: 17 U/L (ref 15–41)
Albumin: 3.8 g/dL (ref 3.5–5.0)
Alkaline Phosphatase: 74 U/L (ref 38–126)
Anion gap: 10 (ref 5–15)
BUN: 21 mg/dL (ref 8–23)
CO2: 20 mmol/L — ABNORMAL LOW (ref 22–32)
Calcium: 9 mg/dL (ref 8.9–10.3)
Chloride: 106 mmol/L (ref 98–111)
Creatinine, Ser: 1.02 mg/dL — ABNORMAL HIGH (ref 0.44–1.00)
GFR, Estimated: 53 mL/min — ABNORMAL LOW (ref >60)
Glucose, Bld: 106 mg/dL — ABNORMAL HIGH (ref 70–99)
Potassium: 3.9 mmol/L (ref 3.5–5.1)
Sodium: 136 mmol/L (ref 135–145)
Total Bilirubin: 0.8 mg/dL (ref 0.3–1.2)
Total Protein: 6.6 g/dL (ref 6.5–8.1)

## 2023-03-02 LAB — CBC
HCT: 44.2 % (ref 36.0–46.0)
Hemoglobin: 14.2 g/dL (ref 12.0–15.0)
MCH: 29.9 pg (ref 26.0–34.0)
MCHC: 32.1 g/dL (ref 30.0–36.0)
MCV: 93.1 fL (ref 80.0–100.0)
Platelets: 318 10*3/uL (ref 150–400)
RBC: 4.75 MIL/uL (ref 3.87–5.11)
RDW: 14.8 % (ref 11.5–15.5)
WBC: 8.5 10*3/uL (ref 4.0–10.5)
nRBC: 0 % (ref 0.0–0.2)

## 2023-03-02 LAB — LIPASE, BLOOD: Lipase: 31 U/L (ref 11–51)

## 2023-03-02 NOTE — ED Triage Notes (Signed)
Pt states that she has been constipated for the past 5-6 days, has small BM today, denies n/v, took OTC meds without relief

## 2023-03-03 ENCOUNTER — Emergency Department
Admission: EM | Admit: 2023-03-03 | Discharge: 2023-03-03 | Disposition: A | Discharge status: Home / Self Care | Payer: PPO | Attending: Emergency Medicine | Admitting: Emergency Medicine

## 2023-03-03 ENCOUNTER — Emergency Department: Payer: PPO

## 2023-03-03 DIAGNOSIS — K5900 Constipation, unspecified: Secondary | ICD-10-CM | POA: Diagnosis not present

## 2023-03-03 DIAGNOSIS — K5730 Diverticulosis of large intestine without perforation or abscess without bleeding: Secondary | ICD-10-CM | POA: Diagnosis not present

## 2023-03-03 DIAGNOSIS — R1900 Intra-abdominal and pelvic swelling, mass and lump, unspecified site: Secondary | ICD-10-CM | POA: Diagnosis not present

## 2023-03-03 LAB — URINALYSIS, ROUTINE W REFLEX MICROSCOPIC
Bilirubin Urine: NEGATIVE
Glucose, UA: NEGATIVE mg/dL
Hgb urine dipstick: NEGATIVE
Ketones, ur: NEGATIVE mg/dL
Leukocytes,Ua: NEGATIVE
Nitrite: NEGATIVE
Protein, ur: NEGATIVE mg/dL
Specific Gravity, Urine: 1.003 — ABNORMAL LOW (ref 1.005–1.030)
pH: 6 (ref 5.0–8.0)

## 2023-03-03 MED ORDER — MAGNESIUM CITRATE PO SOLN: 1.0000 | Freq: Once | ORAL | 0 refills | Status: AC

## 2023-03-03 NOTE — Discharge Instructions (Signed)
Continue taking the MiraLAX.  Try taking the magnesium citrate prescribed today.  You can take half a bottle and then after several hours if you are not having any movement, you can take the rest of the bottle.  Follow-up with your primary care provider.  Return to the ER for new, worsening, or persistent severe constipation, abdominal pain, vomiting, or any other new or worsening symptoms that concern you.

## 2023-03-03 NOTE — ED Provider Notes (Signed)
[facility] Provider Note    Event Date/Time   First MD Initiated Contact with Patient 03/03/23 0030     (approximate)   History   Constipation   HPI  Kayla Chan is a 89 y.o. female with a history of CHF, chronic kidney disease, hypertension, hyperlipidemia who presents with constipation for approximately the last month, worsened over the last week.  The patient states that she has had a few small bowel movements has had to strain a lot recently.  She denies any rectal pain.  She has no blood in the stool.  The patient reports crampy intermittent abdominal pain cross the mid abdomen.  She denies any acute pain currently.  She has no nausea or vomiting.  I reviewed the past medical records.  The patient's most recent outpatient encounter was for an echocardiogram yesterday.  She has no recent hospitalizations.   Physical Exam   Triage Vital Signs: ED Triage Vitals  Enc Vitals Group     BP 03/02/23 2159 (!) 176/95     Pulse Rate 03/02/23 2159 91     Resp 03/02/23 2159 20     Temp 03/02/23 2159 98.5 F (36.9 C)     Temp Source 03/02/23 2159 Oral     SpO2 03/02/23 2159 93 %     Weight --      Height --      Head Circumference --      Peak Flow --      Pain Score 03/02/23 2158 7     Pain Loc --      Pain Edu? --      Excl. in GC? --     Most recent vital signs: Vitals:   03/03/23 0218 03/03/23 0610  BP: (!) 172/86 (!) 169/84  Pulse: 89 79  Resp: 18 16  Temp: 97.6 F (36.4 C) 97.8 F (36.6 C)  SpO2: 97% 98%     General: Awake, no distress.  CV:  Good peripheral perfusion.  Resp:  Normal effort.  Abd:  Soft and nontender.  No distention.  Other:  No impacted stool palpated in the rectal vault.   ED Results / Procedures / Treatments   Labs (all labs ordered are listed, but only abnormal results are displayed) Labs Reviewed  COMPREHENSIVE METABOLIC PANEL - Abnormal; Notable for the following components:      Result Value   CO2  20 (*)    Glucose, Bld 106 (*)    Creatinine, Ser 1.02 (*)    GFR, Estimated 53 (*)    All other components within normal limits  URINALYSIS, ROUTINE W REFLEX MICROSCOPIC - Abnormal; Notable for the following components:   Color, Urine STRAW (*)    APPearance CLEAR (*)    Specific Gravity, Urine 1.003 (*)    All other components within normal limits  LIPASE, BLOOD  CBC     EKG     RADIOLOGY  CT abdomen/pelvis: I independently viewed and interpreted the images.  There are no dilated bowel loops or any free air or free fluid.  IMPRESSION:  1. Fairly mild/average volume of retained stool in the large bowel.  Diverticulosis but no evidence of active inflammation. And otherwise  essentially stable noncontrast CT appearance of the Abdomen since  2014.    2. But superior segment Right Lower Lobe lung opacity more resembles  Bronchopneumonia than atelectasis. Mild aspiration might also have  this appearance. No pleural effusion.    3. Chronic left hip  arthroplasty with a juxta-articular 5.2 cm  rounded, low to intermediate density anterior pelvic mass. Favor  benign etiology such as chronically enlarged Bursa (series 2, image  71).    4. No other acute or inflammatory process identified in the  noncontrast Pelvis.    5.  Aortic Atherosclerosis (ICD10-I70.0).    PROCEDURES:  Critical Care performed: No  Procedures   MEDICATIONS ORDERED IN ED: Medications - No data to display   IMPRESSION / MDM / ASSESSMENT AND PLAN / ED COURSE  I reviewed the triage vital signs and the nursing notes.  89 year old female with PMH as noted above presents with constipation which is worsened over the last week and associated with some intermittent abdominal pain.  It has not been relieved by MiraLAX or enemas at home.  On exam the patient is well-appearing, vitals are normal, and the abdomen is soft and nontender.  CMP, CBC, lipase, and urinalysis are unremarkable.  Differential  diagnosis includes, but is not limited to, simple constipation, colitis, diverticulitis, volvulus, SBO.  Although the patient has a reassuring abdominal exam given her age and comorbidities and the presence of abdominal pain we will obtain a CT for further evaluation.  Patient's presentation is most consistent with acute complicated illness / injury requiring diagnostic workup.  ----------------------------------------- 6:10 AM on 03/03/2023 -----------------------------------------   CT shows no acute findings.  There is some stool in the colon although no evidence of obstruction or other acute complication.  On reassessment, the patient remains comfortable appearing with no active abdominal pain.  She is stable for discharge home.  I have prescribed magnesium citrate and advised her to continue the MiraLAX.  I recommended that she follow-up with her primary care provider.    I informed her about the CT findings including the pelvic mass and instructed her to follow-up with her primary care provider about this as well.    Although the CT shows findings of possible bronchopneumonia versus atelectasis, the patient has no cough, fever, shortness of breath, or chest pain.  She is not hypoxic.  There is no leukocytosis.  There is no clinical evidence for respiratory infection or indication for further workup or treatment.  I gave strict return precautions and the patient and her husband expressed understanding.   FINAL CLINICAL IMPRESSION(S) / ED DIAGNOSES   Final diagnoses:  Constipation, unspecified constipation type     Rx / DC Orders   ED Discharge Orders          Ordered    magnesium citrate SOLN   Once        03/03/23 0537             Note:  This document was prepared using Dragon voice recognition software and may include unintentional dictation errors.    Dionne Bucy, MD 03/03/23 [phone]

## 2023-03-03 NOTE — ED Notes (Signed)
Patient attempted to provide urine sample.  Unable to obtain at this time due to small amount

## 2023-03-09 ENCOUNTER — Telehealth: Payer: Self-pay | Admitting: *Deleted

## 2023-03-09 NOTE — Telephone Encounter (Signed)
Transition Care Management Follow-up Telephone Call Date of discharge and from where: [facility] 03/03/2023 How have you been since you were released from the hospital? Is feeling bad , still having issues with constipation advised to reach out to her primary care  Any questions or concerns? No  Items Reviewed: Did the pt receive and understand the discharge instructions provided? Yes  Medications obtained and verified? No  Other? No  Any new allergies since your discharge? No  Dietary orders reviewed? No Do you have support at home? Yes   T  Follow up appointments reviewed:  PCP Hospital f/u appt confirmed? No  Is feeling bad , still having issues with constipation advised to reach out to her primary care  Are transportation arrangements needed? No  If their condition worsens, is the pt aware to call PCP or go to the Emergency Dept.? Yes Was the patient provided with contact information for the PCP's office or ED? Yes Was to pt encouraged to call back with questions or concerns? Yes

## 2023-03-12 ENCOUNTER — Telehealth (HOSPITAL_COMMUNITY): Payer: Self-pay | Admitting: *Deleted

## 2023-03-12 NOTE — Telephone Encounter (Signed)
Reaching out to patient to offer assistance regarding upcoming cardiac imaging study; pt's husband answered phone and states his wife is hard of hearing. He verbalizes understanding of appt date/time, parking situation and where to check in, pre-test NPO status, and verified current allergies; name and call back number provided for further questions should they arise  Larey Brick RN Navigator Cardiac Imaging [facility] [phone] office [phone] cell

## 2023-03-15 ENCOUNTER — Other Ambulatory Visit: Payer: Self-pay | Admitting: Internal Medicine

## 2023-03-15 ENCOUNTER — Ambulatory Visit
Admission: RE | Admit: 2023-03-15 | Discharge: 2023-03-15 | Disposition: A | Discharge status: Home / Self Care | Payer: PPO | Source: Ambulatory Visit | Attending: Internal Medicine | Admitting: Internal Medicine

## 2023-03-15 DIAGNOSIS — R42 Dizziness and giddiness: Secondary | ICD-10-CM | POA: Insufficient documentation

## 2023-03-15 DIAGNOSIS — R0600 Dyspnea, unspecified: Secondary | ICD-10-CM | POA: Diagnosis not present

## 2023-03-15 DIAGNOSIS — I700 Atherosclerosis of aorta: Secondary | ICD-10-CM | POA: Diagnosis not present

## 2023-03-15 DIAGNOSIS — R0602 Shortness of breath: Secondary | ICD-10-CM

## 2023-03-15 DIAGNOSIS — R079 Chest pain, unspecified: Secondary | ICD-10-CM | POA: Insufficient documentation

## 2023-03-15 DIAGNOSIS — R931 Abnormal findings on diagnostic imaging of heart and coronary circulation: Secondary | ICD-10-CM | POA: Diagnosis not present

## 2023-03-15 MED ORDER — DILTIAZEM HCL 25 MG/5ML IV SOLN
10.0000 mg | Freq: Once | INTRAVENOUS | Status: DC
  Filled 2023-03-15: qty 5

## 2023-03-15 MED ORDER — DILTIAZEM HCL 25 MG/5ML IV SOLN
INTRAVENOUS | Status: AC
  Filled 2023-03-15: qty 5

## 2023-03-15 MED ORDER — NITROGLYCERIN 0.4 MG SL SUBL
0.8000 mg | SUBLINGUAL_TABLET | Freq: Once | SUBLINGUAL | Status: AC
  Administered 2023-03-15: 0.8 mg via SUBLINGUAL
  Filled 2023-03-15: qty 25

## 2023-03-15 MED ORDER — IOHEXOL 350 MG/ML SOLN
100.0000 mL | Freq: Once | INTRAVENOUS | Status: AC | PRN
  Administered 2023-03-15: 100 mL via INTRAVENOUS

## 2023-03-15 MED ORDER — METOPROLOL TARTRATE 5 MG/5ML IV SOLN
10.0000 mg | Freq: Once | INTRAVENOUS | Status: DC
  Filled 2023-03-15: qty 10

## 2023-03-15 MED ORDER — METOPROLOL TARTRATE 5 MG/5ML IV SOLN
INTRAVENOUS | Status: AC
  Filled 2023-03-15: qty 5

## 2023-03-15 MED ORDER — IOHEXOL 350 MG/ML SOLN: 80.0000 mL | Freq: Once | INTRAVENOUS | Status: DC | PRN

## 2023-03-15 MED ORDER — METOPROLOL TARTRATE 5 MG/5ML IV SOLN
5.0000 mg | Freq: Once | INTRAVENOUS | Status: AC
  Administered 2023-03-15: 5 mg via INTRAVENOUS
  Filled 2023-03-15: qty 5

## 2023-03-15 MED ORDER — DILTIAZEM HCL 25 MG/5ML IV SOLN
10.0000 mg | Freq: Once | INTRAVENOUS | Status: AC
  Administered 2023-03-15: 10 mg via INTRAVENOUS

## 2023-03-15 NOTE — Progress Notes (Signed)

## 2023-03-22 DIAGNOSIS — R1084 Generalized abdominal pain: Secondary | ICD-10-CM | POA: Diagnosis not present

## 2023-03-22 DIAGNOSIS — R600 Localized edema: Secondary | ICD-10-CM | POA: Diagnosis not present

## 2023-03-22 DIAGNOSIS — I6523 Occlusion and stenosis of bilateral carotid arteries: Secondary | ICD-10-CM | POA: Diagnosis not present

## 2023-03-22 DIAGNOSIS — R42 Dizziness and giddiness: Secondary | ICD-10-CM | POA: Diagnosis not present

## 2023-03-22 DIAGNOSIS — R9431 Abnormal electrocardiogram [ECG] [EKG]: Secondary | ICD-10-CM | POA: Diagnosis not present

## 2023-03-22 DIAGNOSIS — I10 Essential (primary) hypertension: Secondary | ICD-10-CM | POA: Diagnosis not present

## 2023-03-22 DIAGNOSIS — N183 Chronic kidney disease, stage 3 unspecified: Secondary | ICD-10-CM | POA: Diagnosis not present

## 2023-03-22 DIAGNOSIS — R0602 Shortness of breath: Secondary | ICD-10-CM | POA: Diagnosis not present

## 2023-03-22 DIAGNOSIS — E785 Hyperlipidemia, unspecified: Secondary | ICD-10-CM | POA: Diagnosis not present

## 2023-03-31 DIAGNOSIS — H02889 Meibomian gland dysfunction of unspecified eye, unspecified eyelid: Secondary | ICD-10-CM | POA: Diagnosis not present

## 2023-03-31 DIAGNOSIS — H43813 Vitreous degeneration, bilateral: Secondary | ICD-10-CM | POA: Diagnosis not present

## 2023-03-31 DIAGNOSIS — M3501 Sicca syndrome with keratoconjunctivitis: Secondary | ICD-10-CM | POA: Diagnosis not present

## 2023-03-31 DIAGNOSIS — H2511 Age-related nuclear cataract, right eye: Secondary | ICD-10-CM | POA: Diagnosis not present

## 2023-04-06 DIAGNOSIS — R42 Dizziness and giddiness: Secondary | ICD-10-CM | POA: Diagnosis not present

## 2023-04-06 DIAGNOSIS — R9431 Abnormal electrocardiogram [ECG] [EKG]: Secondary | ICD-10-CM | POA: Diagnosis not present

## 2023-04-06 DIAGNOSIS — R1084 Generalized abdominal pain: Secondary | ICD-10-CM | POA: Diagnosis not present

## 2023-04-06 DIAGNOSIS — N183 Chronic kidney disease, stage 3 unspecified: Secondary | ICD-10-CM | POA: Diagnosis not present

## 2023-04-06 DIAGNOSIS — I6523 Occlusion and stenosis of bilateral carotid arteries: Secondary | ICD-10-CM | POA: Diagnosis not present

## 2023-04-06 DIAGNOSIS — R600 Localized edema: Secondary | ICD-10-CM | POA: Diagnosis not present

## 2023-04-06 DIAGNOSIS — E785 Hyperlipidemia, unspecified: Secondary | ICD-10-CM | POA: Diagnosis not present

## 2023-04-06 DIAGNOSIS — R0602 Shortness of breath: Secondary | ICD-10-CM | POA: Diagnosis not present

## 2023-04-06 DIAGNOSIS — I10 Essential (primary) hypertension: Secondary | ICD-10-CM | POA: Diagnosis not present

## 2023-04-09 ENCOUNTER — Telehealth: Payer: Self-pay

## 2023-04-09 NOTE — Telephone Encounter (Signed)
Patient is calling to find out if we had any sooner appointment then the appointment she is schedule for in september. Informed her no that was the first available appointment but she is on the wait list and we would call her if anything comes open sooner

## 2023-04-19 DIAGNOSIS — R1084 Generalized abdominal pain: Secondary | ICD-10-CM | POA: Diagnosis not present

## 2023-04-19 DIAGNOSIS — N1831 Chronic kidney disease, stage 3a: Secondary | ICD-10-CM | POA: Diagnosis not present

## 2023-04-19 DIAGNOSIS — K5900 Constipation, unspecified: Secondary | ICD-10-CM | POA: Diagnosis not present

## 2023-05-04 DIAGNOSIS — R1084 Generalized abdominal pain: Secondary | ICD-10-CM | POA: Diagnosis not present

## 2023-05-04 DIAGNOSIS — E039 Hypothyroidism, unspecified: Secondary | ICD-10-CM | POA: Diagnosis not present

## 2023-05-04 DIAGNOSIS — K5900 Constipation, unspecified: Secondary | ICD-10-CM | POA: Diagnosis not present

## 2023-05-04 DIAGNOSIS — N1831 Chronic kidney disease, stage 3a: Secondary | ICD-10-CM | POA: Diagnosis not present

## 2023-05-13 DIAGNOSIS — M25552 Pain in left hip: Secondary | ICD-10-CM | POA: Diagnosis not present

## 2023-05-13 DIAGNOSIS — R2242 Localized swelling, mass and lump, left lower limb: Secondary | ICD-10-CM | POA: Diagnosis not present

## 2023-05-14 ENCOUNTER — Ambulatory Visit
Admission: RE | Admit: 2023-05-14 | Discharge: 2023-05-14 | Disposition: A | Discharge status: Home / Self Care | Payer: PPO | Source: Ambulatory Visit | Attending: Student | Admitting: Student

## 2023-05-14 ENCOUNTER — Other Ambulatory Visit: Payer: Self-pay | Admitting: Student

## 2023-05-14 DIAGNOSIS — I82412 Acute embolism and thrombosis of left femoral vein: Secondary | ICD-10-CM | POA: Diagnosis not present

## 2023-05-14 DIAGNOSIS — R2242 Localized swelling, mass and lump, left lower limb: Secondary | ICD-10-CM | POA: Diagnosis not present

## 2023-05-14 DIAGNOSIS — I82432 Acute embolism and thrombosis of left popliteal vein: Secondary | ICD-10-CM | POA: Diagnosis not present

## 2023-05-14 DIAGNOSIS — R600 Localized edema: Secondary | ICD-10-CM | POA: Diagnosis not present

## 2023-05-14 DIAGNOSIS — I2699 Other pulmonary embolism without acute cor pulmonale: Secondary | ICD-10-CM | POA: Diagnosis not present

## 2023-05-18 ENCOUNTER — Encounter (INDEPENDENT_AMBULATORY_CARE_PROVIDER_SITE_OTHER): Payer: Self-pay | Admitting: Vascular Surgery

## 2023-05-18 ENCOUNTER — Telehealth (INDEPENDENT_AMBULATORY_CARE_PROVIDER_SITE_OTHER): Payer: Self-pay

## 2023-05-18 ENCOUNTER — Ambulatory Visit (INDEPENDENT_AMBULATORY_CARE_PROVIDER_SITE_OTHER): Payer: PPO | Admitting: Vascular Surgery

## 2023-05-18 VITALS — BP 186/83 | HR 66 | Resp 18 | Ht 60.0 in | Wt 235.0 lb

## 2023-05-18 DIAGNOSIS — I890 Lymphedema, not elsewhere classified: Secondary | ICD-10-CM | POA: Diagnosis not present

## 2023-05-18 DIAGNOSIS — I82412 Acute embolism and thrombosis of left femoral vein: Secondary | ICD-10-CM | POA: Diagnosis not present

## 2023-05-18 DIAGNOSIS — N1832 Chronic kidney disease, stage 3b: Secondary | ICD-10-CM | POA: Diagnosis not present

## 2023-05-18 DIAGNOSIS — I10 Essential (primary) hypertension: Secondary | ICD-10-CM

## 2023-05-18 DIAGNOSIS — I82409 Acute embolism and thrombosis of unspecified deep veins of unspecified lower extremity: Secondary | ICD-10-CM | POA: Insufficient documentation

## 2023-05-18 NOTE — Progress Notes (Signed)
Patient ID: Kayla Chan, female   DOB: 30-Jan-1932, 89 y.o.   MRN: [id number]  Chief Complaint  Patient presents with   Follow-up    Per JD add on    HPI Kayla Chan is a 89 y.o. female.  I am asked to see the patient by Horris Latino for evaluation of extensive LLE DVT.  The patient has been having progressive leg swelling now for about 3 to 4 weeks.  She had a previous history of DVT many years ago.  She was seen by her orthopedic team last Friday and found to have an extensive left lower extremity DVT on duplex extending up at least into the common femoral vein.  The hospital ultrasound does not typically image the iliac veins, so it could possibly be further.  I have reviewed the study and this does appear to be an extensive left lower extremity DVT.  She is still having a lot of leg swelling even with anticoagulation.  No current chest pain or shortness of breath.   Past Medical History:  Diagnosis Date   Cancer (HCC)    BCC on nose   Carotid artery occlusion    CHF (congestive heart failure) (HCC)    Chronic kidney disease    stage III   Gout    Hyperlipidemia    Hypertension    Hypothyroidism    Lymphedema    Wears hearing aid in both ears     Past Surgical History:  Procedure Laterality Date   24 hour Holter Monitor  01/2013   4550 ectopic beats with 344 superventricular bigemminy events   Abdominal Ultrasound  05/31/2013   RUQ. Fatty Liver   APPENDECTOMY  1950   [place]   CATARACT EXTRACTION W/PHACO Left 07/31/2022   Procedure: CATARACT EXTRACTION PHACO AND INTRAOCULAR LENS PLACEMENT (IOC) LEFT;  Surgeon: Galen Manila, MD;  Location: [facility];  Service: Ophthalmology;  Laterality: Left;  11.46 1:14.3   REPLACEMENT TOTAL KNEE  09/10/2011   Inpatient, [facility], [place]; Right     Family History  Problem Relation Age of Onset   Stroke Mother        possible stroke   Heart Problems Father    Kidney cancer Sister    Bladder  Cancer Brother      Social History   Tobacco Use   Smoking status: Never   Smokeless tobacco: Never  Vaping Use   Vaping status: Never Used  Substance Use Topics   Alcohol use: Not Currently    Comment: 1 glass of wine seldom   Drug use: No     Allergies  Allergen Reactions   Lisinopril     Intense itching per patient can not tolerate    Amlodipine     dizziness   Atorvastatin     Other reaction(s): Muscle Pain   Bactrim [Sulfamethoxazole-Trimethoprim] Itching   Ciprofloxacin     dizziness   Colestid  [Colestipol Hcl]     Itching, insomnia   Colestipol Other (See Comments)    insomnia   Crestor  [Rosuvastatin Calcium]     Other reaction(s): Muscle Pain   Doxycycline    Fluvastatin Sodium     Other reaction(s): Muscle Pain   Furosemide     Severe Cramping   Labetalol Other (See Comments)    Dizziness   Losartan     itching   Lotensin [Benazepril]     unknown   Pravastatin Sodium     Numbness in legs  Prednisone     dizziness   Rosuvastatin     Muscle pain   Verapamil     Shortness of breath, swelling, palpations.    Allopurinol Other (See Comments)    dizziness   Penicillins Rash    Took for pneumonia when she was 23 and mader her mouth break out    Current Outpatient Medications  Medication Sig Dispense Refill   acetaminophen (TYLENOL) 325 MG tablet Take 2 tablets (650 mg total) by mouth every 6 (six) hours as needed for mild pain (or Fever >/= 101).     Ascorbic Acid (VITAMIN C PO) Take by mouth daily.     ezetimibe (ZETIA) 10 MG tablet Take 1 tablet (10 mg total) by mouth daily. 90 tablet 1   levothyroxine (SYNTHROID) 75 MCG tablet TAKE 1 TABLET BY MOUTH EVERY DAY 90 tablet 2   lisinopril (ZESTRIL) 5 MG tablet Take 2.5 mg by mouth daily as needed.     Multiple Vitamin (MULTIVITAMIN) tablet Take 1 tablet by mouth daily.     Rivaroxaban (XARELTO) 15 MG TABS tablet Take 15 mg by mouth 2 (two) times daily with a meal.     aspirin 81 MG tablet Take  81 mg by mouth daily. (Patient not taking: Reported on 05/18/2023)     chlorthalidone (HYGROTON) 25 MG tablet Take 0.5 tablets (12.5 mg total) by mouth daily. (Patient not taking: Reported on 07/28/2022) 45 tablet 1   cloNIDine (CATAPRES) 0.1 MG tablet Take by mouth. (Patient not taking: Reported on 07/28/2022)     cyanocobalamin (VITAMIN B12) 1000 MCG tablet Take by mouth. (Patient not taking: Reported on 07/28/2022)     No current facility-administered medications for this visit.      REVIEW OF SYSTEMS (Negative unless checked)  Constitutional: [] Weight loss  [] Fever  [] Chills Cardiac: [] Chest pain   [] Chest pressure   [] Palpitations   [] Shortness of breath when laying flat   [] Shortness of breath at rest   [] Shortness of breath with exertion. Vascular:  [] Pain in legs with walking   [] Pain in legs at rest   [] Pain in legs when laying flat   [] Claudication   [] Pain in feet when walking  [] Pain in feet at rest  [] Pain in feet when laying flat   [x] History of DVT   [x] Phlebitis   [x] Swelling in legs   [] Varicose veins   [] Non-healing ulcers Pulmonary:   [] Uses home oxygen   [] Productive cough   [] Hemoptysis   [] Wheeze  [] COPD   [] Asthma Neurologic:  [] Dizziness  [] Blackouts   [] Seizures   [] History of stroke   [] History of TIA  [] Aphasia   [] Temporary blindness   [] Dysphagia   [] Weakness or numbness in arms   [] Weakness or numbness in legs Musculoskeletal:  [] Arthritis   [] Joint swelling   [] Joint pain   [] Low back pain Hematologic:  [] Easy bruising  [] Easy bleeding   [] Hypercoagulable state   [] Anemic  [] Hepatitis Gastrointestinal:  [] Blood in stool   [] Vomiting blood  [x] Gastroesophageal reflux/heartburn   [] Abdominal pain Genitourinary:  [x] Chronic kidney disease   [] Difficult urination  [] Frequent urination  [] Burning with urination   [] Hematuria Skin:  [] Rashes   [] Ulcers   [] Wounds Psychological:  [] History of anxiety   []  History of major depression.    Physical Exam BP (!) 186/83 (BP  Location: Left Wrist)   Pulse 66   Resp 18   Ht 5' (1.524 m)   Wt 235 lb (106.6 kg)   BMI 45.90 kg/m  Gen:  WD/WN, NAD. Appears younger than stated age. Head: [place]/AT, No temporalis wasting.  Ear/Nose/Throat: Hearing grossly intact, nares w/o erythema or drainage, oropharynx w/o Erythema/Exudate Eyes: Conjunctiva clear, sclera non-icteric  Neck: trachea midline.  No JVD.  Pulmonary:  Good air movement, respirations not labored, no use of accessory muscles  Cardiac: irregular Vascular:  Vessel Right Left  Radial Palpable Palpable                          DP NP NP  PT 1+ NP   Gastrointestinal:. No masses, surgical incisions, or scars. Musculoskeletal: M/S 5/5 throughout.  Extremities without ischemic changes.  No deformity or atrophy.  1+ right lower extremity edema, 3-4+ left lower extremity edema. Neurologic: Sensation grossly intact in extremities.  Symmetrical.  Speech is fluent. Motor exam as listed above. Psychiatric: Judgment intact, Mood & affect appropriate for pt's clinical situation. Dermatologic: No rashes or ulcers noted.  No cellulitis or open wounds.    Radiology US Venous Img Lower Unilateral Left (DVT)  Result Date: 05/14/2023 CLINICAL DATA:  Left lower extremity pain and edema. History of pulmonary embolism. Evaluate for DVT. EXAM: LEFT LOWER EXTREMITY VENOUS DOPPLER ULTRASOUND TECHNIQUE: Gray-scale sonography with graded compression, as well as color Doppler and duplex ultrasound were performed to evaluate the lower extremity deep venous systems from the level of the common femoral vein and including the common femoral, femoral, profunda femoral, popliteal and calf veins including the posterior tibial, peroneal and gastrocnemius veins when visible. The superficial great saphenous vein was also interrogated. Spectral Doppler was utilized to evaluate flow at rest and with distal augmentation maneuvers in the common femoral, femoral and popliteal veins. COMPARISON:   None Available. FINDINGS: Contralateral Common Femoral Vein: Respiratory phasicity is normal and symmetric with the symptomatic side. No evidence of thrombus. Normal compressibility. There is mixed echogenic occlusive thrombus involving the left common femoral vein (images 2 and 3), extending to involve the saphenofemoral junction (image 8). There is occlusive DVT involving the imaged portions of the left deep femoral vein (image 14). There is occlusive thrombus involving the proximal (image 15), mid (image 18) and distal (image 22) aspects of the left femoral vein). T Here is hypoechoic occlusive thrombus involving the left popliteal vein (images 24 through 29), extending to involve both paired divisions of the left posterior tibial vein. The left peroneal vein was not visualized. Other Findings: There is a minimal amount of subcutaneous edema at the level of the left calf. IMPRESSION: Examination is positive for extensive occlusive DVT extending from the left common femoral vein through the imaged left tibial veins. Electronically Signed   By: Simonne Come M.D.   On: 05/14/2023 14:22    Labs Recent Results (from the past 2160 hour(s))  Lipase, blood     Status: None   Collection Time: 03/02/23 10:01 PM  Result Value Ref Range   Lipase 31 11 - 51 U/L    Comment: Performed at [facility], [address] [place], [place] [address]  Comprehensive metabolic panel     Status: Abnormal   Collection Time: 03/02/23 10:01 PM  Result Value Ref Range   Sodium 136 135 - 145 mmol/L   Potassium 3.9 3.5 - 5.1 mmol/L   Chloride 106 98 - 111 mmol/L   CO2 20 (L) 22 - 32 mmol/L   Glucose, Bld 106 (H) 70 - 99 mg/dL    Comment: Glucose reference range applies only to samples taken  after fasting for at least 8 hours.   BUN 21 8 - 23 mg/dL   Creatinine, Ser 1.61 (H) 0.44 - 1.00 mg/dL   Calcium 9.0 8.9 - 09.6 mg/dL   Total Protein 6.6 6.5 - 8.1 g/dL   Albumin 3.8 3.5 - 5.0 g/dL   AST 17 15 - 41 U/L   ALT  10 0 - 44 U/L   Alkaline Phosphatase 74 38 - 126 U/L   Total Bilirubin 0.8 0.3 - 1.2 mg/dL   GFR, Estimated 53 (L) >60 mL/min    Comment: (NOTE) Calculated using the CKD-EPI Creatinine Equation (2021)    Anion gap 10 5 - 15    Comment: Performed at [facility], [address] [place], [place] [address]  CBC     Status: None   Collection Time: 03/02/23 10:01 PM  Result Value Ref Range   WBC 8.5 4.0 - 10.5 K/uL   RBC 4.75 3.87 - 5.11 MIL/uL   Hemoglobin 14.2 12.0 - 15.0 g/dL   HCT 98.1 19.1 - 47.8 %   MCV 93.1 80.0 - 100.0 fL   MCH 29.9 26.0 - 34.0 pg   MCHC 32.1 30.0 - 36.0 g/dL   RDW 29.5 62.1 - 30.8 %   Platelets 318 150 - 400 K/uL   nRBC 0.0 0.0 - 0.2 %    Comment: Performed at [facility], [address] [place], [place] [address]  Urinalysis, Routine w reflex microscopic -Urine, Clean Catch     Status: Abnormal   Collection Time: 03/03/23  2:15 AM  Result Value Ref Range   Color, Urine STRAW (A) YELLOW   APPearance CLEAR (A) CLEAR   Specific Gravity, Urine 1.003 (L) 1.005 - 1.030   pH 6.0 5.0 - 8.0   Glucose, UA NEGATIVE NEGATIVE mg/dL   Hgb urine dipstick NEGATIVE NEGATIVE   Bilirubin Urine NEGATIVE NEGATIVE   Ketones, ur NEGATIVE NEGATIVE mg/dL   Protein, ur NEGATIVE NEGATIVE mg/dL   Nitrite NEGATIVE NEGATIVE   Leukocytes,Ua NEGATIVE NEGATIVE    Comment: Performed at [facility], [address] [place], [place] [address]    Assessment/Plan:  DVT (deep venous thrombosis) (HCC) The patient found to have an extensive left lower extremity DVT on duplex extending up at least into the common femoral vein.  The hospital ultrasound does not typically image the iliac veins, so it could possibly be further.  I have reviewed the study and this does appear to be an extensive left lower extremity DVT. I had a long discussion with the patient and her husband today regarding this extensive left lower extremity DVT.  Given how extensive and how  symptomatic she is, I believe she would benefit significantly from a venous thrombectomy of the left lower extremity.  I discussed the risks and benefits of the procedure.  She can continue her anticoagulation.  This will be done later this week.  Essential hypertension blood pressure control important in reducing the progression of atherosclerotic disease. On appropriate oral medications.   Stage 3b chronic kidney disease (HCC) May worsen LE swelling  Lymphedema Some chronic leg swelling prior to this new event, but this is much worse than usual.      Festus Barren 05/18/2023, 3:49 PM   This note was created with Dragon medical transcription system.  Any errors from dictation are unintentional.

## 2023-05-18 NOTE — Assessment & Plan Note (Signed)
The patient found to have an extensive left lower extremity DVT on duplex extending up at least into the common femoral vein.  The hospital ultrasound does not typically image the iliac veins, so it could possibly be further.  I have reviewed the study and this does appear to be an extensive left lower extremity DVT. I had a long discussion with the patient and her husband today regarding this extensive left lower extremity DVT.  Given how extensive and how symptomatic she is, I believe she would benefit significantly from a venous thrombectomy of the left lower extremity.  I discussed the risks and benefits of the procedure.  She can continue her anticoagulation.  This will be done later this week.

## 2023-05-18 NOTE — Assessment & Plan Note (Signed)
May worsen LE swelling

## 2023-05-18 NOTE — H&P (View-Only) (Signed)
Patient ID: Kayla Chan, female   DOB: 30-Jan-1932, 89 y.o.   MRN: [id number]  Chief Complaint  Patient presents with   Follow-up    Per JD add on    HPI Kayla Chan is a 89 y.o. female.  I am asked to see the patient by Horris Latino for evaluation of extensive LLE DVT.  The patient has been having progressive leg swelling now for about 3 to 4 weeks.  She had a previous history of DVT many years ago.  She was seen by her orthopedic team last Friday and found to have an extensive left lower extremity DVT on duplex extending up at least into the common femoral vein.  The hospital ultrasound does not typically image the iliac veins, so it could possibly be further.  I have reviewed the study and this does appear to be an extensive left lower extremity DVT.  She is still having a lot of leg swelling even with anticoagulation.  No current chest pain or shortness of breath.   Past Medical History:  Diagnosis Date   Cancer (HCC)    BCC on nose   Carotid artery occlusion    CHF (congestive heart failure) (HCC)    Chronic kidney disease    stage III   Gout    Hyperlipidemia    Hypertension    Hypothyroidism    Lymphedema    Wears hearing aid in both ears     Past Surgical History:  Procedure Laterality Date   24 hour Holter Monitor  01/2013   4550 ectopic beats with 344 superventricular bigemminy events   Abdominal Ultrasound  05/31/2013   RUQ. Fatty Liver   APPENDECTOMY  1950   [place]   CATARACT EXTRACTION W/PHACO Left 07/31/2022   Procedure: CATARACT EXTRACTION PHACO AND INTRAOCULAR LENS PLACEMENT (IOC) LEFT;  Surgeon: Galen Manila, MD;  Location: [facility];  Service: Ophthalmology;  Laterality: Left;  11.46 1:14.3   REPLACEMENT TOTAL KNEE  09/10/2011   Inpatient, [facility], [place]; Right     Family History  Problem Relation Age of Onset   Stroke Mother        possible stroke   Heart Problems Father    Kidney cancer Sister    Bladder  Cancer Brother      Social History   Tobacco Use   Smoking status: Never   Smokeless tobacco: Never  Vaping Use   Vaping status: Never Used  Substance Use Topics   Alcohol use: Not Currently    Comment: 1 glass of wine seldom   Drug use: No     Allergies  Allergen Reactions   Lisinopril     Intense itching per patient can not tolerate    Amlodipine     dizziness   Atorvastatin     Other reaction(s): Muscle Pain   Bactrim [Sulfamethoxazole-Trimethoprim] Itching   Ciprofloxacin     dizziness   Colestid  [Colestipol Hcl]     Itching, insomnia   Colestipol Other (See Comments)    insomnia   Crestor  [Rosuvastatin Calcium]     Other reaction(s): Muscle Pain   Doxycycline    Fluvastatin Sodium     Other reaction(s): Muscle Pain   Furosemide     Severe Cramping   Labetalol Other (See Comments)    Dizziness   Losartan     itching   Lotensin [Benazepril]     unknown   Pravastatin Sodium     Numbness in legs  Prednisone     dizziness   Rosuvastatin     Muscle pain   Verapamil     Shortness of breath, swelling, palpations.    Allopurinol Other (See Comments)    dizziness   Penicillins Rash    Took for pneumonia when she was 23 and mader her mouth break out    Current Outpatient Medications  Medication Sig Dispense Refill   acetaminophen (TYLENOL) 325 MG tablet Take 2 tablets (650 mg total) by mouth every 6 (six) hours as needed for mild pain (or Fever >/= 101).     Ascorbic Acid (VITAMIN C PO) Take by mouth daily.     ezetimibe (ZETIA) 10 MG tablet Take 1 tablet (10 mg total) by mouth daily. 90 tablet 1   levothyroxine (SYNTHROID) 75 MCG tablet TAKE 1 TABLET BY MOUTH EVERY DAY 90 tablet 2   lisinopril (ZESTRIL) 5 MG tablet Take 2.5 mg by mouth daily as needed.     Multiple Vitamin (MULTIVITAMIN) tablet Take 1 tablet by mouth daily.     Rivaroxaban (XARELTO) 15 MG TABS tablet Take 15 mg by mouth 2 (two) times daily with a meal.     aspirin 81 MG tablet Take  81 mg by mouth daily. (Patient not taking: Reported on 05/18/2023)     chlorthalidone (HYGROTON) 25 MG tablet Take 0.5 tablets (12.5 mg total) by mouth daily. (Patient not taking: Reported on 07/28/2022) 45 tablet 1   cloNIDine (CATAPRES) 0.1 MG tablet Take by mouth. (Patient not taking: Reported on 07/28/2022)     cyanocobalamin (VITAMIN B12) 1000 MCG tablet Take by mouth. (Patient not taking: Reported on 07/28/2022)     No current facility-administered medications for this visit.      REVIEW OF SYSTEMS (Negative unless checked)  Constitutional: [] Weight loss  [] Fever  [] Chills Cardiac: [] Chest pain   [] Chest pressure   [] Palpitations   [] Shortness of breath when laying flat   [] Shortness of breath at rest   [] Shortness of breath with exertion. Vascular:  [] Pain in legs with walking   [] Pain in legs at rest   [] Pain in legs when laying flat   [] Claudication   [] Pain in feet when walking  [] Pain in feet at rest  [] Pain in feet when laying flat   [x] History of DVT   [x] Phlebitis   [x] Swelling in legs   [] Varicose veins   [] Non-healing ulcers Pulmonary:   [] Uses home oxygen   [] Productive cough   [] Hemoptysis   [] Wheeze  [] COPD   [] Asthma Neurologic:  [] Dizziness  [] Blackouts   [] Seizures   [] History of stroke   [] History of TIA  [] Aphasia   [] Temporary blindness   [] Dysphagia   [] Weakness or numbness in arms   [] Weakness or numbness in legs Musculoskeletal:  [] Arthritis   [] Joint swelling   [] Joint pain   [] Low back pain Hematologic:  [] Easy bruising  [] Easy bleeding   [] Hypercoagulable state   [] Anemic  [] Hepatitis Gastrointestinal:  [] Blood in stool   [] Vomiting blood  [x] Gastroesophageal reflux/heartburn   [] Abdominal pain Genitourinary:  [x] Chronic kidney disease   [] Difficult urination  [] Frequent urination  [] Burning with urination   [] Hematuria Skin:  [] Rashes   [] Ulcers   [] Wounds Psychological:  [] History of anxiety   []  History of major depression.    Physical Exam BP (!) 186/83 (BP  Location: Left Wrist)   Pulse 66   Resp 18   Ht 5' (1.524 m)   Wt 235 lb (106.6 kg)   BMI 45.90 kg/m  Gen:  WD/WN, NAD. Appears younger than stated age. Head: [place]/AT, No temporalis wasting.  Ear/Nose/Throat: Hearing grossly intact, nares w/o erythema or drainage, oropharynx w/o Erythema/Exudate Eyes: Conjunctiva clear, sclera non-icteric  Neck: trachea midline.  No JVD.  Pulmonary:  Good air movement, respirations not labored, no use of accessory muscles  Cardiac: irregular Vascular:  Vessel Right Left  Radial Palpable Palpable                          DP NP NP  PT 1+ NP   Gastrointestinal:. No masses, surgical incisions, or scars. Musculoskeletal: M/S 5/5 throughout.  Extremities without ischemic changes.  No deformity or atrophy.  1+ right lower extremity edema, 3-4+ left lower extremity edema. Neurologic: Sensation grossly intact in extremities.  Symmetrical.  Speech is fluent. Motor exam as listed above. Psychiatric: Judgment intact, Mood & affect appropriate for pt's clinical situation. Dermatologic: No rashes or ulcers noted.  No cellulitis or open wounds.    Radiology US Venous Img Lower Unilateral Left (DVT)  Result Date: 05/14/2023 CLINICAL DATA:  Left lower extremity pain and edema. History of pulmonary embolism. Evaluate for DVT. EXAM: LEFT LOWER EXTREMITY VENOUS DOPPLER ULTRASOUND TECHNIQUE: Gray-scale sonography with graded compression, as well as color Doppler and duplex ultrasound were performed to evaluate the lower extremity deep venous systems from the level of the common femoral vein and including the common femoral, femoral, profunda femoral, popliteal and calf veins including the posterior tibial, peroneal and gastrocnemius veins when visible. The superficial great saphenous vein was also interrogated. Spectral Doppler was utilized to evaluate flow at rest and with distal augmentation maneuvers in the common femoral, femoral and popliteal veins. COMPARISON:   None Available. FINDINGS: Contralateral Common Femoral Vein: Respiratory phasicity is normal and symmetric with the symptomatic side. No evidence of thrombus. Normal compressibility. There is mixed echogenic occlusive thrombus involving the left common femoral vein (images 2 and 3), extending to involve the saphenofemoral junction (image 8). There is occlusive DVT involving the imaged portions of the left deep femoral vein (image 14). There is occlusive thrombus involving the proximal (image 15), mid (image 18) and distal (image 22) aspects of the left femoral vein). T Here is hypoechoic occlusive thrombus involving the left popliteal vein (images 24 through 29), extending to involve both paired divisions of the left posterior tibial vein. The left peroneal vein was not visualized. Other Findings: There is a minimal amount of subcutaneous edema at the level of the left calf. IMPRESSION: Examination is positive for extensive occlusive DVT extending from the left common femoral vein through the imaged left tibial veins. Electronically Signed   By: Simonne Come M.D.   On: 05/14/2023 14:22    Labs Recent Results (from the past 2160 hour(s))  Lipase, blood     Status: None   Collection Time: 03/02/23 10:01 PM  Result Value Ref Range   Lipase 31 11 - 51 U/L    Comment: Performed at [facility], [address] [place], [place] [address]  Comprehensive metabolic panel     Status: Abnormal   Collection Time: 03/02/23 10:01 PM  Result Value Ref Range   Sodium 136 135 - 145 mmol/L   Potassium 3.9 3.5 - 5.1 mmol/L   Chloride 106 98 - 111 mmol/L   CO2 20 (L) 22 - 32 mmol/L   Glucose, Bld 106 (H) 70 - 99 mg/dL    Comment: Glucose reference range applies only to samples taken  after fasting for at least 8 hours.   BUN 21 8 - 23 mg/dL   Creatinine, Ser 1.61 (H) 0.44 - 1.00 mg/dL   Calcium 9.0 8.9 - 09.6 mg/dL   Total Protein 6.6 6.5 - 8.1 g/dL   Albumin 3.8 3.5 - 5.0 g/dL   AST 17 15 - 41 U/L   ALT  10 0 - 44 U/L   Alkaline Phosphatase 74 38 - 126 U/L   Total Bilirubin 0.8 0.3 - 1.2 mg/dL   GFR, Estimated 53 (L) >60 mL/min    Comment: (NOTE) Calculated using the CKD-EPI Creatinine Equation (2021)    Anion gap 10 5 - 15    Comment: Performed at [facility], [address] [place], [place] [address]  CBC     Status: None   Collection Time: 03/02/23 10:01 PM  Result Value Ref Range   WBC 8.5 4.0 - 10.5 K/uL   RBC 4.75 3.87 - 5.11 MIL/uL   Hemoglobin 14.2 12.0 - 15.0 g/dL   HCT 98.1 19.1 - 47.8 %   MCV 93.1 80.0 - 100.0 fL   MCH 29.9 26.0 - 34.0 pg   MCHC 32.1 30.0 - 36.0 g/dL   RDW 29.5 62.1 - 30.8 %   Platelets 318 150 - 400 K/uL   nRBC 0.0 0.0 - 0.2 %    Comment: Performed at [facility], [address] [place], [place] [address]  Urinalysis, Routine w reflex microscopic -Urine, Clean Catch     Status: Abnormal   Collection Time: 03/03/23  2:15 AM  Result Value Ref Range   Color, Urine STRAW (A) YELLOW   APPearance CLEAR (A) CLEAR   Specific Gravity, Urine 1.003 (L) 1.005 - 1.030   pH 6.0 5.0 - 8.0   Glucose, UA NEGATIVE NEGATIVE mg/dL   Hgb urine dipstick NEGATIVE NEGATIVE   Bilirubin Urine NEGATIVE NEGATIVE   Ketones, ur NEGATIVE NEGATIVE mg/dL   Protein, ur NEGATIVE NEGATIVE mg/dL   Nitrite NEGATIVE NEGATIVE   Leukocytes,Ua NEGATIVE NEGATIVE    Comment: Performed at [facility], [address] [place], [place] [address]    Assessment/Plan:  DVT (deep venous thrombosis) (HCC) The patient found to have an extensive left lower extremity DVT on duplex extending up at least into the common femoral vein.  The hospital ultrasound does not typically image the iliac veins, so it could possibly be further.  I have reviewed the study and this does appear to be an extensive left lower extremity DVT. I had a long discussion with the patient and her husband today regarding this extensive left lower extremity DVT.  Given how extensive and how  symptomatic she is, I believe she would benefit significantly from a venous thrombectomy of the left lower extremity.  I discussed the risks and benefits of the procedure.  She can continue her anticoagulation.  This will be done later this week.  Essential hypertension blood pressure control important in reducing the progression of atherosclerotic disease. On appropriate oral medications.   Stage 3b chronic kidney disease (HCC) May worsen LE swelling  Lymphedema Some chronic leg swelling prior to this new event, but this is much worse than usual.      Festus Barren 05/18/2023, 3:49 PM   This note was created with Dragon medical transcription system.  Any errors from dictation are unintentional.

## 2023-05-18 NOTE — Telephone Encounter (Signed)
Patient was seen in office and scheduled for a left leg thrombectomy with Dr. Wyn Quaker. Scheduled for 05/20/23 with a 2:00 pm arrival time to the [facility]. Pre-procedure instructions were discussed and handed to patient.

## 2023-05-18 NOTE — Assessment & Plan Note (Signed)
Some chronic leg swelling prior to this new event, but this is much worse than usual.

## 2023-05-18 NOTE — Assessment & Plan Note (Signed)
blood pressure control important in reducing the progression of atherosclerotic disease. On appropriate oral medications.

## 2023-05-19 ENCOUNTER — Encounter: Payer: Self-pay | Admitting: Physician Assistant

## 2023-05-19 ENCOUNTER — Ambulatory Visit: Payer: PPO | Admitting: Physician Assistant

## 2023-05-19 VITALS — BP 196/94 | HR 75 | Temp 98.4°F | Ht 60.0 in | Wt 226.4 lb

## 2023-05-19 DIAGNOSIS — K219 Gastro-esophageal reflux disease without esophagitis: Secondary | ICD-10-CM | POA: Diagnosis not present

## 2023-05-19 DIAGNOSIS — Z8719 Personal history of other diseases of the digestive system: Secondary | ICD-10-CM | POA: Diagnosis not present

## 2023-05-19 DIAGNOSIS — K2100 Gastro-esophageal reflux disease with esophagitis, without bleeding: Secondary | ICD-10-CM

## 2023-05-19 DIAGNOSIS — R1010 Upper abdominal pain, unspecified: Secondary | ICD-10-CM | POA: Diagnosis not present

## 2023-05-19 DIAGNOSIS — R1013 Epigastric pain: Secondary | ICD-10-CM

## 2023-05-19 DIAGNOSIS — K5904 Chronic idiopathic constipation: Secondary | ICD-10-CM | POA: Diagnosis not present

## 2023-05-19 MED ORDER — ESOMEPRAZOLE MAGNESIUM 40 MG PO CPDR
40.0000 mg | DELAYED_RELEASE_CAPSULE | Freq: Every day | ORAL | 5 refills | Status: DC
Start: 2023-05-19 — End: 2023-06-22

## 2023-05-19 MED ORDER — SUCRALFATE 1 G PO TABS
1.0000 g | ORAL_TABLET | Freq: Three times a day (TID) | ORAL | 2 refills | Status: DC
Start: 2023-05-19 — End: 2023-07-16

## 2023-05-19 MED ORDER — LINACLOTIDE 145 MCG PO CAPS
145.0000 ug | ORAL_CAPSULE | Freq: Every day | ORAL | Status: DC
Start: 2023-05-19 — End: 2023-06-22

## 2023-05-19 NOTE — Progress Notes (Signed)
Kayla Chan, [place] hi nice to meet you my okay where is the pain wears here today [address]  [address]  [place], [place] [address]  Main: [phone]  Fax: [phone]   Gastroenterology Consultation  Referring Provider:     Mick Sell, MD Primary Care Physician:  Mick Sell, MD Primary Gastroenterologist:  Kayla Amy, PA-C / Dr. Midge Minium   Reason for Consultation:     Abdominal pain and constipation        HPI:   Kayla Chan is a 89 y.o. y/o female referred for consultation & management  by Mick Sell, MD. She is here today with her husband.  Here to evaluate generalized abdominal pain and constipation.  Patient describes generalized upper abdominal pain mostly in the epigastrium which radiates across her entire upper abdomen like a band.  Also radiates to her back.  It is worse if she strains to have a bowel movement.  Upper abdominal pain is not affected by eating.  She has not had any nausea or vomiting.  She admits to moderate chronic constipation.  Has bowel movement every 4 days with hard stools and straining.  She has taken OTC antacid and Tylenol which helps her abdominal pain.  She had taken Dulcolax 5 mg 3 tablets twice daily which helps constipation, however causes abdominal cramping.  She has tried MiraLAX, stool softener, and lactulose in the past with little benefit.  She has hiccups.  Had negative H. pylori breath test in the past 2 months through her PCP.  Currently takes as omeprazole 20 Mg once daily.  No NSAIDs.  Is on 81 mg aspirin daily.  Also takes Xarelto 15 mg daily.  Was recently diagnosed with blood clot in her left groin and is scheduled for vascular procedure tomorrow.  Recent labs 03/2023 showed normal CBC, CMP, and TSH.  Abdominal pelvic CT without contrast 03/03/2023: Mild to moderate sigmoid diverticulosis, chronic ventral abdominal hernia repair with mesh, stable since 2014.  Retained stool in the colon  consistent with constipation.  No acute abnormality.  Last EGD by Dr. Servando Snare 07/2013 showed LA grade C esophagitis without bleeding lower third of the esophagus.  Medium hiatal hernia.  112 mm duodenal ulcer nonbleeding.  Last screening colonoscopy 07/2013 showed moderate sigmoid diverticulosis, small internal hemorrhoids, otherwise normal.  No repeat due to advanced age.   Past Medical History:  Diagnosis Date   Cancer (HCC)    BCC on nose   Carotid artery occlusion    CHF (congestive heart failure) (HCC)    Chronic kidney disease    stage III   Gout    Hyperlipidemia    Hypertension    Hypothyroidism    Lymphedema    Wears hearing aid in both ears     Past Surgical History:  Procedure Laterality Date   24 hour Holter Monitor  01/2013   4550 ectopic beats with 344 superventricular bigemminy events   Abdominal Ultrasound  05/31/2013   RUQ. Fatty Liver   APPENDECTOMY  1950   [place]   CATARACT EXTRACTION W/PHACO Left 07/31/2022   Procedure: CATARACT EXTRACTION PHACO AND INTRAOCULAR LENS PLACEMENT (IOC) LEFT;  Surgeon: Galen Manila, MD;  Location: [facility];  Service: Ophthalmology;  Laterality: Left;  11.46 1:14.3   REPLACEMENT TOTAL KNEE  09/10/2011   Inpatient, [facility], [place]; Right    Prior to Admission medications   Medication Sig Start Date End Date Taking? Authorizing Provider  acetaminophen (TYLENOL) 325  MG tablet Take 2 tablets (650 mg total) by mouth every 6 (six) hours as needed for mild pain (or Fever >/= 101). 12/05/20  Yes Wieting, Richard, MD  Ascorbic Acid (VITAMIN C PO) Take by mouth daily.   Yes [provider]  aspirin 81 MG tablet Take 81 mg by mouth daily.   Yes [provider]  chlorthalidone (HYGROTON) 25 MG tablet Take 0.5 tablets (12.5 mg total) by mouth daily. 03/25/21  Yes Malva Limes, MD  cloNIDine (CATAPRES) 0.1 MG tablet Take by mouth. 06/16/22 06/16/23 Yes [provider]  cyanocobalamin  (VITAMIN B12) 1000 MCG tablet Take by mouth.   Yes [provider]  diltiazem (CARDIZEM) 120 MG tablet Take 120 mg by mouth 4 (four) times daily.   Yes [provider]  docusate sodium (COLACE) 100 MG capsule Take 100 mg by mouth 2 (two) times daily.   Yes [provider]  esomeprazole (NEXIUM) 20 MG capsule Take 20 mg by mouth daily at 12 noon.   Yes [provider]  ezetimibe (ZETIA) 10 MG tablet Take 1 tablet (10 mg total) by mouth daily. 04/21/21  Yes Malva Limes, MD  hydrALAZINE (APRESOLINE) 25 MG tablet Take 25 mg by mouth 3 (three) times daily.   Yes [provider]  lactulose (CHRONULAC) 10 GM/15ML solution Take by mouth 3 (three) times daily.   Yes [provider]  levothyroxine (SYNTHROID) 75 MCG tablet TAKE 1 TABLET BY MOUTH EVERY DAY 01/26/21  Yes Malva Limes, MD  Multiple Vitamin (MULTIVITAMIN) tablet Take 1 tablet by mouth daily.   Yes [provider]  Rivaroxaban (XARELTO) 15 MG TABS tablet Take 15 mg by mouth 2 (two) times daily with a meal. 05/14/23  Yes [provider]    Family History  Problem Relation Age of Onset   Stroke Mother        possible stroke   Heart Problems Father    Kidney cancer Sister    Bladder Cancer Brother      Social History   Tobacco Use   Smoking status: Never   Smokeless tobacco: Never  Vaping Use   Vaping status: Never Used  Substance Use Topics   Alcohol use: Not Currently    Comment: 1 glass of wine seldom   Drug use: No    Allergies as of 05/19/2023 - Review Complete 05/19/2023  Allergen Reaction Noted   Lisinopril  01/10/2020   Amlodipine  03/07/2018   Atorvastatin  06/28/2015   Bactrim [sulfamethoxazole-trimethoprim] Itching 12/18/2020   Ciprofloxacin  07/28/2022   Colestid  [colestipol hcl]  06/28/2015   Colestipol Other (See Comments) 01/29/2021   Crestor  [rosuvastatin calcium]  06/28/2015   Doxycycline  06/28/2015   Fluvastatin sodium   06/28/2015   Furosemide  06/28/2015   Labetalol Other (See Comments) 01/29/2021   Losartan  05/21/2020   Lotensin [benazepril]  07/28/2022   Pravastatin sodium  06/28/2015   Prednisone  07/28/2022   Rosuvastatin  07/28/2022   Verapamil  06/10/2017   Allopurinol Other (See Comments) 09/21/2018   Penicillins Rash 06/28/2015    Review of Systems:    All systems reviewed and negative except where noted in HPI.   Physical Exam:  BP (!) 196/94   Pulse 75   Temp 98.4 F (36.9 C)   Ht 5' (1.524 m)   Wt 226 lb 6.4 oz (102.7 kg)   BMI 44.22 kg/m  No LMP recorded. Patient is postmenopausal.  General:  Alert,  Well-developed, well-nourished, pleasant and cooperative in NAD Lungs:  Respirations even and unlabored.  Clear throughout to auscultation.   No wheezes, crackles, or rhonchi. No acute distress. Heart:  Regular rate and rhythm; no murmurs, clicks, rubs, or gallops. Abdomen:  Normal bowel sounds.  No bruits.  Soft, and non-distended without masses, hepatosplenomegaly or hernias noted.  No Tenderness.  No guarding or rebound tenderness.    Neurologic:  Alert and oriented x3;  grossly normal neurologically. Psych:  Alert and cooperative. Normal mood and affect.  Imaging Studies: US Venous Img Lower Unilateral Left (DVT)  Result Date: 05/14/2023 CLINICAL DATA:  Left lower extremity pain and edema. History of pulmonary embolism. Evaluate for DVT. EXAM: LEFT LOWER EXTREMITY VENOUS DOPPLER ULTRASOUND TECHNIQUE: Gray-scale sonography with graded compression, as well as color Doppler and duplex ultrasound were performed to evaluate the lower extremity deep venous systems from the level of the common femoral vein and including the common femoral, femoral, profunda femoral, popliteal and calf veins including the posterior tibial, peroneal and gastrocnemius veins when visible. The superficial great saphenous vein was also interrogated. Spectral Doppler was utilized to evaluate flow at rest and  with distal augmentation maneuvers in the common femoral, femoral and popliteal veins. COMPARISON:  None Available. FINDINGS: Contralateral Common Femoral Vein: Respiratory phasicity is normal and symmetric with the symptomatic side. No evidence of thrombus. Normal compressibility. There is mixed echogenic occlusive thrombus involving the left common femoral vein (images 2 and 3), extending to involve the saphenofemoral junction (image 8). There is occlusive DVT involving the imaged portions of the left deep femoral vein (image 14). There is occlusive thrombus involving the proximal (image 15), mid (image 18) and distal (image 22) aspects of the left femoral vein). T Here is hypoechoic occlusive thrombus involving the left popliteal vein (images 24 through 29), extending to involve both paired divisions of the left posterior tibial vein. The left peroneal vein was not visualized. Other Findings: There is a minimal amount of subcutaneous edema at the level of the left calf. IMPRESSION: Examination is positive for extensive occlusive DVT extending from the left common femoral vein through the imaged left tibial veins. Electronically Signed   By: Simonne Come M.D.   On: 05/14/2023 14:22    Assessment and Plan:   Dellia Runck is a 89 y.o. y/o female has been referred for:  1.  Upper abdominal pain: Differential includes constipation, gastritis, peptic ulcer, GERD, musculoskeletal pain.  Recent labs, and abdominal pelvic CT were unremarkable.  Recent H. pylori breath test was negative 44-month ago per PCP.  2.  GERD and history of hiatal hernia  Increase Nexium to 40 mg once daily, 5 refills.  3.  History of duodenal ulcer (last EGD in 2014)  Start Carafate 1 g 3 times daily before meals, 2 refills.  4.  Chronic idiopathic constipation  Gave samples of Linzess 72 mcg QD for 1 week, then 145 mcg QD for 1 week, then 290 mcg QD for 1 week.  She will let me know which dose works best, and then she can call  me back for a prescription.   Follow up in 4 to 6 weeks with TG.  Kayla Amy, PA-C

## 2023-05-20 ENCOUNTER — Encounter: Payer: Self-pay | Admitting: Vascular Surgery

## 2023-05-20 ENCOUNTER — Encounter: Payer: Self-pay | Admitting: Gastroenterology

## 2023-05-20 ENCOUNTER — Ambulatory Visit
Admission: RE | Admit: 2023-05-20 | Discharge: 2023-05-20 | Disposition: A | Discharge status: Home / Self Care | Payer: PPO | Attending: Vascular Surgery | Admitting: Vascular Surgery

## 2023-05-20 ENCOUNTER — Encounter
Admission: RE | Disposition: A | Discharge status: Home / Self Care | Payer: Self-pay | Source: Home / Self Care | Attending: Vascular Surgery

## 2023-05-20 ENCOUNTER — Other Ambulatory Visit: Payer: Self-pay

## 2023-05-20 DIAGNOSIS — I82409 Acute embolism and thrombosis of unspecified deep veins of unspecified lower extremity: Secondary | ICD-10-CM

## 2023-05-20 DIAGNOSIS — I82412 Acute embolism and thrombosis of left femoral vein: Secondary | ICD-10-CM | POA: Diagnosis not present

## 2023-05-20 DIAGNOSIS — I509 Heart failure, unspecified: Secondary | ICD-10-CM | POA: Diagnosis not present

## 2023-05-20 DIAGNOSIS — N1832 Chronic kidney disease, stage 3b: Secondary | ICD-10-CM | POA: Insufficient documentation

## 2023-05-20 DIAGNOSIS — I82402 Acute embolism and thrombosis of unspecified deep veins of left lower extremity: Secondary | ICD-10-CM

## 2023-05-20 DIAGNOSIS — I890 Lymphedema, not elsewhere classified: Secondary | ICD-10-CM | POA: Diagnosis not present

## 2023-05-20 DIAGNOSIS — I130 Hypertensive heart and chronic kidney disease with heart failure and stage 1 through stage 4 chronic kidney disease, or unspecified chronic kidney disease: Secondary | ICD-10-CM | POA: Diagnosis not present

## 2023-05-20 DIAGNOSIS — I82419 Acute embolism and thrombosis of unspecified femoral vein: Secondary | ICD-10-CM

## 2023-05-20 DIAGNOSIS — I871 Compression of vein: Secondary | ICD-10-CM

## 2023-05-20 DIAGNOSIS — Z7901 Long term (current) use of anticoagulants: Secondary | ICD-10-CM | POA: Insufficient documentation

## 2023-05-20 HISTORY — DX: Acute embolism and thrombosis of unspecified femoral vein: I82.419

## 2023-05-20 HISTORY — PX: PERIPHERAL VASCULAR THROMBECTOMY: CATH118306

## 2023-05-20 LAB — BUN: BUN: 15 mg/dL (ref 8–23)

## 2023-05-20 LAB — CREATININE, SERUM
Creatinine, Ser: 1.03 mg/dL — ABNORMAL HIGH (ref 0.44–1.00)
GFR, Estimated: 52 mL/min — ABNORMAL LOW (ref >60)

## 2023-05-20 SURGERY — PERIPHERAL VASCULAR THROMBECTOMY
Anesthesia: Moderate Sedation | Laterality: Left

## 2023-05-20 MED ORDER — IODIXANOL 320 MG/ML IV SOLN
INTRAVENOUS | Status: DC | PRN
  Administered 2023-05-20: 40 mL

## 2023-05-20 MED ORDER — CEFAZOLIN SODIUM-DEXTROSE 2-4 GM/100ML-% IV SOLN
INTRAVENOUS | Status: AC
  Filled 2023-05-20: qty 100

## 2023-05-20 MED ORDER — FENTANYL CITRATE (PF) 100 MCG/2ML IJ SOLN
INTRAMUSCULAR | Status: DC | PRN
  Administered 2023-05-20: 50 ug via INTRAVENOUS
  Administered 2023-05-20 (×2): 25 ug via INTRAVENOUS

## 2023-05-20 MED ORDER — MIDAZOLAM HCL 2 MG/2ML IJ SOLN
INTRAMUSCULAR | Status: AC
  Filled 2023-05-20: qty 2

## 2023-05-20 MED ORDER — ALTEPLASE 2 MG IJ SOLR
INTRAMUSCULAR | Status: AC
  Filled 2023-05-20: qty 12

## 2023-05-20 MED ORDER — MIDAZOLAM HCL 2 MG/2ML IJ SOLN
INTRAMUSCULAR | Status: DC | PRN
  Administered 2023-05-20 (×3): 1 mg via INTRAVENOUS

## 2023-05-20 MED ORDER — FENTANYL CITRATE PF 50 MCG/ML IJ SOSY
PREFILLED_SYRINGE | INTRAMUSCULAR | Status: AC
  Filled 2023-05-20: qty 1

## 2023-05-20 MED ORDER — ALTEPLASE 1 MG/ML SYRINGE FOR VASCULAR PROCEDURE
INTRAMUSCULAR | Status: DC | PRN
  Administered 2023-05-20 (×2): 6 mg via INTRA_ARTERIAL

## 2023-05-20 MED ORDER — HEPARIN SODIUM (PORCINE) 1000 UNIT/ML IJ SOLN
INTRAMUSCULAR | Status: AC
  Filled 2023-05-20: qty 10

## 2023-05-20 MED ORDER — HEPARIN SODIUM (PORCINE) 1000 UNIT/ML IJ SOLN
INTRAMUSCULAR | Status: DC | PRN
  Administered 2023-05-20: 3000 [IU] via INTRAVENOUS

## 2023-05-20 MED ORDER — SODIUM CHLORIDE 0.9 % IV SOLN: INTRAVENOUS | Status: DC

## 2023-05-20 MED ORDER — HEPARIN (PORCINE) IN NACL 1000-0.9 UT/500ML-% IV SOLN
INTRAVENOUS | Status: DC | PRN
  Administered 2023-05-20: 1000 mL

## 2023-05-20 MED ORDER — HYDROMORPHONE HCL 1 MG/ML IJ SOLN: 1.0000 mg | Freq: Once | INTRAMUSCULAR | Status: DC | PRN

## 2023-05-20 MED ORDER — FAMOTIDINE 20 MG PO TABS: 40.0000 mg | ORAL_TABLET | Freq: Once | ORAL | Status: DC | PRN

## 2023-05-20 MED ORDER — LIDOCAINE-EPINEPHRINE (PF) 1 %-1:200000 IJ SOLN
INTRAMUSCULAR | Status: DC | PRN
  Administered 2023-05-20: 20 mL

## 2023-05-20 MED ORDER — ACETAMINOPHEN 500 MG PO TABS
1000.0000 mg | ORAL_TABLET | Freq: Four times a day (QID) | ORAL | Status: DC | PRN
  Administered 2023-05-20: 500 mg via ORAL

## 2023-05-20 MED ORDER — CEFAZOLIN SODIUM-DEXTROSE 2-4 GM/100ML-% IV SOLN
2.0000 g | INTRAVENOUS | Status: AC
  Administered 2023-05-20: 2 g via INTRAVENOUS

## 2023-05-20 MED ORDER — METHYLPREDNISOLONE SODIUM SUCC 125 MG IJ SOLR: 125.0000 mg | Freq: Once | INTRAMUSCULAR | Status: DC | PRN

## 2023-05-20 MED ORDER — SODIUM CHLORIDE FLUSH 0.9 % IV SOLN
INTRAVENOUS | Status: AC
  Filled 2023-05-20: qty 10

## 2023-05-20 MED ORDER — ACETAMINOPHEN 500 MG PO TABS
ORAL_TABLET | ORAL | Status: AC
  Filled 2023-05-20: qty 1

## 2023-05-20 MED ORDER — MIDAZOLAM HCL 2 MG/ML PO SYRP: 8.0000 mg | ORAL_SOLUTION | Freq: Once | ORAL | Status: DC | PRN

## 2023-05-20 MED ORDER — DIPHENHYDRAMINE HCL 50 MG/ML IJ SOLN: 50.0000 mg | Freq: Once | INTRAMUSCULAR | Status: DC | PRN

## 2023-05-20 MED ORDER — ONDANSETRON HCL 4 MG/2ML IJ SOLN: 4.0000 mg | Freq: Four times a day (QID) | INTRAMUSCULAR | Status: DC | PRN

## 2023-05-20 SURGICAL SUPPLY — 21 items
ADH SKN CLS APL DERMABOND .7 (GAUZE/BANDAGES/DRESSINGS) ×1
BALLN ATG 12X6X80 (BALLOONS) ×1
BALLN ATG 14X4X80 (BALLOONS) ×1
BALLN DORADO 10X80X80 (BALLOONS) ×1
BALLOON ATG 12X6X80 (BALLOONS) IMPLANT
BALLOON ATG 14X4X80 (BALLOONS) IMPLANT
BALLOON DORADO 10X80X80 (BALLOONS) IMPLANT
CANISTER PENUMBRA ENGINE (MISCELLANEOUS) IMPLANT
CANNULA 5F STIFF (CANNULA) IMPLANT
CATH LIGHTNI FLASH 16XTORQ 100 (CATHETERS) IMPLANT
CATH LIGHTNING FLASH XTORQ 100 (CATHETERS) ×1
COVER PROBE ULTRASOUND 5X96 (MISCELLANEOUS) IMPLANT
DERMABOND ADVANCED .7 DNX12 (GAUZE/BANDAGES/DRESSINGS) IMPLANT
GLIDEWIRE ADV .035X260CM (WIRE) IMPLANT
KIT ENCORE 26 ADVANTAGE (KITS) IMPLANT
PACK ANGIOGRAPHY (CUSTOM PROCEDURE TRAY) ×1 IMPLANT
SHEATH BRITE TIP 6FRX11 (SHEATH) IMPLANT
SHEATH INTRO CHECKFLO 16F 13 (SHEATH) IMPLANT
STENT VENOVO 14X80X80 (Permanent Stent) IMPLANT
SUT MNCRL AB 4-0 PS2 18 (SUTURE) IMPLANT
WIRE GUIDERIGHT .035X150 (WIRE) IMPLANT

## 2023-05-20 NOTE — Discharge Instructions (Signed)
Venogram, Care After This sheet gives you information about how to care for yourself after your procedure. Your health care provider may also give you more specific instructions. If you have problems or questions, contact your health care provider. What can I expect after the procedure? After the procedure, it is common to have: Bruising or mild discomfort in the area where the IV was inserted (insertion site).  Follow these instructions at home: Eating and drinking Follow instructions from your health care provider about eating or drinking restrictions. Drink a lot of fluids for the first several days after the procedure, as directed by your health care provider. This helps to wash (flush) the contrast out of your body. Examples of healthy fluids include water or low-calorie drinks. General instructions Check your left leg venous site  for: Redness, swelling, or pain. Fluid or blood. Warmth. Pus or a bad smell. Take over-the-counter and prescription medicines only as told by your health care provider. Rest and return to your normal activities as told by your health care provider. Ask your health care provider what activities are safe for you. Do not drive for 24 hours  Keep all follow-up visits as told by your health care provider. This is important. You may remove the dressing from your left leg vein site in 24hr Keep leg elevated as much as possible Avoid heavy lifting for 5 days  Contact a health care provider if: Your skin becomes itchy or you develop a rash or hives. You have a fever that does not get better with medicine. You feel nauseous. You vomit. You have redness, swelling, or pain around the insertion site. You have fluid or blood coming from the insertion site. Your insertion area feels warm to the touch. You have pus or a bad smell coming from the insertion site. Get help right away if: You have difficulty breathing or shortness of breath. You develop chest  pain. You faint. You feel very dizzy. These symptoms may represent a serious problem that is an emergency. Do not wait to see if the symptoms will go away. Get medical help right away. Call your local emergency services (911 in the [place] Do not drive yourself to the hospital. Summary After your procedure, it is common to have bruising or mild discomfort in the area where the IV was inserted. You should check your IV insertion area every day for signs of infection. Take over-the-counter and prescription medicines only as told by your health care provider. You should drink a lot of fluids for the first several days after the procedure to help flush the contrast from your body. This information is not intended to replace advice given to you by your health care provider. Make sure you discuss any questions you have with your health care provider. Document Released: 05/31/2013 Document Revised: 07/04/2016 Document Reviewed: 07/04/2016 Elsevier Interactive Patient Education  2017 [employer].

## 2023-05-20 NOTE — Interval H&P Note (Signed)
History and Physical Interval Note:  05/20/2023 9:55 AM  Kayla Chan  has presented today for surgery, with the diagnosis of L leg thrombectomy   DVT.  The various methods of treatment have been discussed with the patient and family. After consideration of risks, benefits and other options for treatment, the patient has consented to  Procedure(s): PERIPHERAL VASCULAR THROMBECTOMY (Left) as a surgical intervention.  The patient's history has been reviewed, patient examined, no change in status, stable for surgery.  I have reviewed the patient's chart and labs.  Questions were answered to the patient's satisfaction.     Festus Barren

## 2023-05-20 NOTE — Op Note (Signed)
[facility]   OPERATIVE NOTE   PRE-OPERATIVE DIAGNOSIS: extensive LLE DVT  POST-OPERATIVE DIAGNOSIS: same   PROCEDURE: 1.   US guidance for vascular access to left popliteal vein 2.   Catheter placement into left iliac vein from left popliteal approach 3.   IVC gram and left lower extremity venogram 4.   Catheter directed thrombolysis with 6 mg of tpa to the left femoral veins 5.   Mechanical thrombectomy to the left popliteal vein, femoral vein, common femoral vein, and external iliac vein with the penumbra 16 flash device 6.   PTA of left external iliac vein and common femoral vein with 10 mm balloon 7.   Stent placement to the left external iliac vein with 14 mm diameter by 8 cm length Venovo stent   SURGEON: Festus Barren, MD  ASSISTANT(S): none  ANESTHESIA: local with moderate conscious sedation for 53 minutes using 3 mg of Versed and 100 mcg of Fentanyl  ESTIMATED BLOOD LOSS: 350 cc  FINDING(S): 1.  Extensive left lower extremity DVT with near complete occlusive stenosis of the left common femoral vein and distal external iliac vein  SPECIMEN(S):  none  INDICATIONS:    Patient is a 89 y.o. female who presents with extensive left lower extremity DVT.  Patient has marked leg swelling and pain.  Venous intervention is performed to reduce the symtpoms and avoid long term postphlebitic symptoms.    DESCRIPTION: After obtaining full informed written consent, the patient was brought back to the vascular suite and placed supine upon the table. Moderate conscious sedation was administered during a face to face encounter with the patient throughout the procedure with my supervision of the RN administering medicines and monitoring the patient's vital signs, pulse oximetry, telemetry and mental status throughout from the start of the procedure until the patient was taken to the recovery room.  After obtaining adequate anesthesia, the patient was prepped and draped in  the standard fashion.    The patient was then placed into the prone position.  The left popliteal vein was then accessed under direct ultrasound guidance without difficulty with a micropuncture needle and a permanent image was recorded.  I then upsized to an 8Fr sheath over a J wire.  A ProGlide device was placed in a preclose fashion and then we upsized to a 16 [place] sheath.  3000 units of heparin were then given.  Imaging showed extensive DVT with minimal flow.  A Kumpe catheter and Magic tourque wire were then advanced into the CFV and images were performed.  There was a high-grade narrowing of the common femoral vein and distal external iliac vein of greater than 90%.  I was able to cross the thrombus and stenosis and advance into the proximal external iliac vein which was patent but somewhat decompressed.  I then imaged the common iliac vein and the IVC which were both patent.  I then used the sheath and instilled 6 mg of tpa throughout the femoral veins.  After this dwelled, I used the Penumbra Cat 16 Flash catheter and evacuated about 250 cc of effluent with mechanical thrombectomy throughout the iliac veins, CFV, SFV, and popliteal vein.  This had marked improvement.  There is a high-grade narrowing at the common femoral vein and distal external iliac vein as before with some residual thrombus there.  I ran the penumbra CAT 16 flash catheter once more and then proceeded with angioplasty of this location.  The stenosis/occlusion and thrombus was treated with a  10 mm diameter angioplasty balloon.  Following angioplasty, there was minimal improvement in the iliac vein with some improvement in the common femoral vein.  I then used a 14 mm diameter by 8 cm length Venovo stent that started at the top of the femoral head and treated the external iliac vein.  This was postdilated with a 12 mm diameter angioplasty balloon that was also brought back down into the common femoral vein to retreat that at 6 atm as well.   Finally, a 14 mm diameter by 4 cm length balloon was kept within the stent in the external iliac vein and inflated to 6 atm.  Completion imaging showed marked improvement with much more brisk flow and about a 25% residual stenosis.  I then elected to terminate the procedure.  The sheath, the Pro-glide device was secured, a 4-0 Monocryl suture was placed at the skin and a dressing was placed.  She was taken to the recovery room in stable condition having tolerated the procedure well.    COMPLICATIONS: None  CONDITION: Stable  Festus Barren 05/20/2023 12:33 PM

## 2023-05-21 ENCOUNTER — Encounter: Payer: Self-pay | Admitting: Vascular Surgery

## 2023-05-24 ENCOUNTER — Telehealth: Payer: Self-pay

## 2023-05-24 NOTE — Telephone Encounter (Signed)
Patient husband is calling because patient was seen on 05/19/2023 and was given Sucralfate and Linzess. On Friday and Saturday when she took them she had some trouble breathing. Sunday only took the Sucralfate  and did not have any trouble breathing . Today she took both medications about 1 hour ago and is having dizziness and shortness of breath. Recommended patient husband to take patient to the ER or Urgent care to get evaluated. Patient husband declined and states it is not that bad. Informed him that this is a allergic reaction and you do not know if it will get worse. He went and ask his wife if symptoms were improving she said no and she could not get in the car to go anywhere. Informed him he needed to call 911 then. He states he will not do that she is not that bad. Put patient husband on hold and went and talk to Celso Amy and she states she recommends my advice but to also stop the Linzess and to start back the Doculax and to keep follow up appointment. She said to call us back in a couple days if you dont go to get evacuated today.  Patient husband verbalized understanding

## 2023-05-25 ENCOUNTER — Encounter: Payer: Self-pay | Admitting: Vascular Surgery

## 2023-05-31 ENCOUNTER — Telehealth (INDEPENDENT_AMBULATORY_CARE_PROVIDER_SITE_OTHER): Payer: Self-pay

## 2023-05-31 NOTE — Telephone Encounter (Signed)
When the patient had her procedure we didn't do anything in her groin, we went behind her knee.  We can bring her in for a left dvt study (I'm assuming it is the left groin, if it is her right it is likely not vascular in nature), but the patient likely still has some residual thrombus left.  We can try to work in if anything is abnormal but otherwise she may need to see PCP

## 2023-05-31 NOTE — Telephone Encounter (Signed)
Patient's spouse called stating the patient is having a lot of pain in her groin and needs to be seen. Patient had a thrombectomy on 05/20/23 with Dr. Wyn Quaker. Please advise.

## 2023-05-31 NOTE — Telephone Encounter (Signed)
Spoke with the patient and gave him the recommendation from Sheppard Plumber NP. Patient's spouse was transferred to front desk as well to get the patient scheduled.

## 2023-06-01 ENCOUNTER — Other Ambulatory Visit: Payer: Self-pay

## 2023-06-01 ENCOUNTER — Other Ambulatory Visit (INDEPENDENT_AMBULATORY_CARE_PROVIDER_SITE_OTHER): Payer: PPO

## 2023-06-01 ENCOUNTER — Encounter (INDEPENDENT_AMBULATORY_CARE_PROVIDER_SITE_OTHER): Payer: Self-pay | Admitting: Nurse Practitioner

## 2023-06-01 ENCOUNTER — Emergency Department
Admission: EM | Admit: 2023-06-01 | Discharge: 2023-06-01 | Disposition: A | Discharge status: Home / Self Care | Payer: PPO | Attending: Emergency Medicine | Admitting: Emergency Medicine

## 2023-06-01 ENCOUNTER — Other Ambulatory Visit (INDEPENDENT_AMBULATORY_CARE_PROVIDER_SITE_OTHER): Payer: Self-pay | Admitting: Nurse Practitioner

## 2023-06-01 DIAGNOSIS — M25562 Pain in left knee: Secondary | ICD-10-CM | POA: Insufficient documentation

## 2023-06-01 DIAGNOSIS — I82412 Acute embolism and thrombosis of left femoral vein: Secondary | ICD-10-CM

## 2023-06-01 DIAGNOSIS — M79605 Pain in left leg: Secondary | ICD-10-CM

## 2023-06-01 DIAGNOSIS — R1032 Left lower quadrant pain: Secondary | ICD-10-CM

## 2023-06-01 DIAGNOSIS — M25552 Pain in left hip: Secondary | ICD-10-CM | POA: Insufficient documentation

## 2023-06-01 DIAGNOSIS — M7122 Synovial cyst of popliteal space [Baker], left knee: Secondary | ICD-10-CM | POA: Diagnosis not present

## 2023-06-01 DIAGNOSIS — M5430 Sciatica, unspecified side: Secondary | ICD-10-CM

## 2023-06-01 MED ORDER — MELOXICAM 15 MG PO TABS: 15.0000 mg | ORAL_TABLET | Freq: Every day | ORAL | 2 refills | Status: DC

## 2023-06-01 MED ORDER — RIVAROXABAN 20 MG PO TABS: 20.0000 mg | ORAL_TABLET | Freq: Every day | ORAL | 6 refills | Status: AC

## 2023-06-01 MED ORDER — HYDROCODONE-ACETAMINOPHEN 5-325 MG PO TABS: 1.0000 | ORAL_TABLET | ORAL | 0 refills | Status: DC | PRN

## 2023-06-01 MED ORDER — HYDROCODONE-ACETAMINOPHEN 5-325 MG PO TABS
1.0000 | ORAL_TABLET | Freq: Once | ORAL | Status: AC
  Administered 2023-06-01: 1 via ORAL
  Filled 2023-06-01: qty 1

## 2023-06-01 NOTE — ED Triage Notes (Signed)
Arrives from [facility] for evaluation of left groin and leg pain.  History of Thrombectomy for extensive left lower extremity DVT on 9/26.  Per report, pt had a negative US done yesterday.  AAOx3.  Skin warm and dry. NAD

## 2023-06-01 NOTE — ED Triage Notes (Addendum)
Pt sts that she has been having left leg and left groin pain since the 26th of last month. Pt sts that [place] vein took out a clot in the pt leg. Pt had a US of the left leg today per the pt husband and no blood clot was found.

## 2023-06-01 NOTE — ED Notes (Signed)
Pt reports pain in left leg where a blood clot was recently removed. No swelling. No significant redness. No heat.

## 2023-06-01 NOTE — ED Provider Notes (Signed)
[facility] Provider Note   Event Date/Time   First MD Initiated Contact with Patient 06/01/23 1910     (approximate) History  Leg Pain  HPI Kayla Chan is a 89 y.o. female with an extensive past medical history with relevant past medical history of right knee Baker's cyst, left knee replacement who presents for left hip and knee pain.  Patient states that this pain been worsening over the last 48 hours.  Patient states this pain is worse with movement.  Patient describes burning pain that radiates down the back of the left hip to the knee and then is the entire circumference of the lower leg.  Of note patient has had a DVT in this leg in the past however she had a DVT ultrasound yesterday that showed no evidence of blood clots. ROS: Patient currently denies any vision changes, tinnitus, difficulty speaking, facial droop, sore throat, chest pain, shortness of breath, abdominal pain, nausea/vomiting/diarrhea, dysuria, or weakness/numbness/paresthesias in any extremity   Physical Exam  Triage Vital Signs: ED Triage Vitals  Encounter Vitals Group     BP 06/01/23 1728 (!) 158/78     Systolic BP Percentile --      Diastolic BP Percentile --      Pulse Rate 06/01/23 1728 66     Resp 06/01/23 1728 18     Temp 06/01/23 1728 97.7 F (36.5 C)     Temp src --      SpO2 06/01/23 1728 100 %     Weight 06/01/23 1727 227 lb (103 kg)     Height 06/01/23 1727 5' (1.524 m)     Head Circumference --      Peak Flow --      Pain Score 06/01/23 1727 8     Pain Loc --      Pain Education --      Exclude from Growth Chart --    Most recent vital signs: Vitals:   06/01/23 1728  BP: (!) 158/78  Pulse: 66  Resp: 18  Temp: 97.7 F (36.5 C)  SpO2: 100%   General: Awake, oriented x4. CV:  Good peripheral perfusion.  Resp:  Normal effort.  Abd:  No distention.  Other:  Elderly obese Caucasian female resting comfortably in no acute distress.  Tenderness to palpation in  the posterior region of the left knee.  Painful range of motion at the left hip and knee.  Patient is ambulatory with walker at baseline and is ambulatory here without difficulty ED Results / Procedures / Treatments  Labs (all labs ordered are listed, but only abnormal results are displayed) Labs Reviewed - No data to display PROCEDURES: Critical Care performed: No Procedures MEDICATIONS ORDERED IN ED: Medications  HYDROcodone-acetaminophen (NORCO/VICODIN) 5-325 MG per tablet 1 tablet (1 tablet Oral Given 06/01/23 1957)   IMPRESSION / MDM / ASSESSMENT AND PLAN / ED COURSE  I reviewed the triage vital signs and the nursing notes.                             The patient is on the cardiac monitor to evaluate for evidence of arrhythmia and/or significant heart rate changes. Patient's presentation is most consistent with acute presentation with potential threat to life or bodily function. 89 year old female presents for left lower extremity pain Given history, exam and workup I have low suspicion for fracture, dislocation, significant ligamentous injury, septic arthritis, gout flare, new autoimmune arthropathy, or  gonococcal arthropathy.  Interventions: Analgesia  Pain well-controlled and patient ambulatory prior to discharge Disposition: Discharge home with strict return precautions and instructions for prompt primary care follow up in the next week.   FINAL CLINICAL IMPRESSION(S) / ED DIAGNOSES   Final diagnoses:  Acute pain of left knee  Sciatic leg pain   Rx / DC Orders   ED Discharge Orders          Ordered    HYDROcodone-acetaminophen (NORCO) 5-325 MG tablet  Every 4 hours PRN        06/01/23 2038    meloxicam (MOBIC) 15 MG tablet  Daily        06/01/23 2038           Note:  This document was prepared using Dragon voice recognition software and may include unintentional dictation errors.   Merwyn Katos, MD 06/01/23 2041

## 2023-06-08 DIAGNOSIS — Z96642 Presence of left artificial hip joint: Secondary | ICD-10-CM | POA: Diagnosis not present

## 2023-06-08 DIAGNOSIS — M25552 Pain in left hip: Secondary | ICD-10-CM | POA: Diagnosis not present

## 2023-06-11 DIAGNOSIS — H903 Sensorineural hearing loss, bilateral: Secondary | ICD-10-CM | POA: Diagnosis not present

## 2023-06-14 ENCOUNTER — Other Ambulatory Visit: Payer: Self-pay | Admitting: Student

## 2023-06-14 DIAGNOSIS — Z96642 Presence of left artificial hip joint: Secondary | ICD-10-CM

## 2023-06-14 DIAGNOSIS — M25552 Pain in left hip: Secondary | ICD-10-CM

## 2023-06-15 ENCOUNTER — Other Ambulatory Visit (INDEPENDENT_AMBULATORY_CARE_PROVIDER_SITE_OTHER): Payer: Self-pay | Admitting: Nurse Practitioner

## 2023-06-15 DIAGNOSIS — M79605 Pain in left leg: Secondary | ICD-10-CM

## 2023-06-16 ENCOUNTER — Encounter (INDEPENDENT_AMBULATORY_CARE_PROVIDER_SITE_OTHER): Payer: Self-pay | Admitting: Nurse Practitioner

## 2023-06-16 ENCOUNTER — Ambulatory Visit (INDEPENDENT_AMBULATORY_CARE_PROVIDER_SITE_OTHER): Payer: PPO | Admitting: Nurse Practitioner

## 2023-06-16 ENCOUNTER — Ambulatory Visit (INDEPENDENT_AMBULATORY_CARE_PROVIDER_SITE_OTHER): Payer: PPO

## 2023-06-16 VITALS — BP 161/77 | HR 74 | Ht 60.0 in | Wt 227.0 lb

## 2023-06-16 DIAGNOSIS — I82412 Acute embolism and thrombosis of left femoral vein: Secondary | ICD-10-CM | POA: Diagnosis not present

## 2023-06-16 DIAGNOSIS — I10 Essential (primary) hypertension: Secondary | ICD-10-CM | POA: Diagnosis not present

## 2023-06-16 DIAGNOSIS — M79605 Pain in left leg: Secondary | ICD-10-CM | POA: Diagnosis not present

## 2023-06-21 NOTE — Progress Notes (Unsigned)
POP      Full           Yes                                          +---------+---------------+---------+-----------+----------+--------------+ PTV      Full           Yes                                          +---------+---------------+---------+-----------+----------+--------------+ PERO     Full           Yes                                          +---------+---------------+---------+-----------+----------+--------------+ Gastroc  Full           Yes                                          +---------+---------------+---------+-----------+----------+--------------+ GSV      Full           Yes                                           +---------+---------------+---------+-----------+----------+--------------+ SSV      Full                                                        +---------+---------------+---------+-----------+----------+--------------+    Summary: RIGHT: - No evidence of common femoral vein obstruction.   LEFT: - No evidence of deep vein thrombosis in the lower extremity. No indirect evidence of obstruction proximal to the inguinal ligament.   *See table(s) above for measurements and observations. Electronically signed by Festus Barren MD on 06/17/2023 at 9:32:25 AM.    Final    VAS [place] LOWER EXTREMITY VENOUS (DVT)  Result Date: 06/02/2023  Lower Venous DVT Study Patient Name:  Kayla Chan  Date of Exam:   06/01/2023 Medical Rec #: [id number]       Accession #:    [id number] Date of Birth: [birth year]       Patient Gender: F Patient Age:   89 years Exam Location:  [facility] Procedure:      VAS [place] LOWER EXTREMITY VENOUS (DVT) Referring Phys: Sheppard Plumber --------------------------------------------------------------------------------  Indications: Pain.  Performing Technologist: Debbe Bales RVS  Examination Guidelines: A complete evaluation includes B-mode imaging, spectral Doppler, color Doppler, and power Doppler as needed of all accessible portions of each vessel. Bilateral testing is considered an integral part of a complete examination. Limited examinations for reoccurring indications may be performed as noted. The reflux portion of the exam is performed with the patient in reverse Trendelenburg.  +---------+---------------+---------+-----------+----------+--------------+ LEFT     CompressibilityPhasicitySpontaneityPropertiesThrombus Aging +---------+---------------+---------+-----------+----------+--------------+ CFV      Full  Accession #:    [id number] Date of Birth: 1932-03-25       Patient Gender: F Patient Age:   64 years Exam Location:  [facility] Procedure:      VAS [place] LOWER EXTREMITY VENOUS (DVT) Referring Phys: Sheppard Plumber --------------------------------------------------------------------------------  Indications: Left leg hip and knee pain.  Performing Technologist: Salvadore Farber RVT  Examination Guidelines: A complete evaluation includes B-mode imaging, spectral Doppler, color Doppler, and power Doppler as needed of all accessible portions of each vessel. Bilateral testing is considered an integral part of a complete examination. Limited examinations for reoccurring indications may be performed as noted. The reflux portion of the exam is performed with the patient in reverse Trendelenburg.  +-----+---------------+---------+-----------+----------+--------------+ RIGHTCompressibilityPhasicitySpontaneityPropertiesThrombus Aging +-----+---------------+---------+-----------+----------+--------------+ CFV  Full           Yes                                          +-----+---------------+---------+-----------+----------+--------------+ SFJ  Full           Yes                                           +-----+---------------+---------+-----------+----------+--------------+   +---------+---------------+---------+-----------+----------+--------------+ LEFT     CompressibilityPhasicitySpontaneityPropertiesThrombus Aging +---------+---------------+---------+-----------+----------+--------------+ CFV      Full           Yes                                          +---------+---------------+---------+-----------+----------+--------------+ SFJ      Full           Yes                                          +---------+---------------+---------+-----------+----------+--------------+ FV Prox  Full           Yes                                          +---------+---------------+---------+-----------+----------+--------------+ FV Mid   Full           Yes                                          +---------+---------------+---------+-----------+----------+--------------+ FV DistalFull           Yes                                          +---------+---------------+---------+-----------+----------+--------------+ PFV      Full           Yes                                          +---------+---------------+---------+-----------+----------+--------------+  POP      Full           Yes                                          +---------+---------------+---------+-----------+----------+--------------+ PTV      Full           Yes                                          +---------+---------------+---------+-----------+----------+--------------+ PERO     Full           Yes                                          +---------+---------------+---------+-----------+----------+--------------+ Gastroc  Full           Yes                                          +---------+---------------+---------+-----------+----------+--------------+ GSV      Full           Yes                                           +---------+---------------+---------+-----------+----------+--------------+ SSV      Full                                                        +---------+---------------+---------+-----------+----------+--------------+    Summary: RIGHT: - No evidence of common femoral vein obstruction.   LEFT: - No evidence of deep vein thrombosis in the lower extremity. No indirect evidence of obstruction proximal to the inguinal ligament.   *See table(s) above for measurements and observations. Electronically signed by Festus Barren MD on 06/17/2023 at 9:32:25 AM.    Final    VAS [place] LOWER EXTREMITY VENOUS (DVT)  Result Date: 06/02/2023  Lower Venous DVT Study Patient Name:  Kayla Chan  Date of Exam:   06/01/2023 Medical Rec #: [id number]       Accession #:    [id number] Date of Birth: [birth year]       Patient Gender: F Patient Age:   89 years Exam Location:  [facility] Procedure:      VAS [place] LOWER EXTREMITY VENOUS (DVT) Referring Phys: Sheppard Plumber --------------------------------------------------------------------------------  Indications: Pain.  Performing Technologist: Debbe Bales RVS  Examination Guidelines: A complete evaluation includes B-mode imaging, spectral Doppler, color Doppler, and power Doppler as needed of all accessible portions of each vessel. Bilateral testing is considered an integral part of a complete examination. Limited examinations for reoccurring indications may be performed as noted. The reflux portion of the exam is performed with the patient in reverse Trendelenburg.  +---------+---------------+---------+-----------+----------+--------------+ LEFT     CompressibilityPhasicitySpontaneityPropertiesThrombus Aging +---------+---------------+---------+-----------+----------+--------------+ CFV      Full  Accession #:    [id number] Date of Birth: 1932-03-25       Patient Gender: F Patient Age:   64 years Exam Location:  [facility] Procedure:      VAS [place] LOWER EXTREMITY VENOUS (DVT) Referring Phys: Sheppard Plumber --------------------------------------------------------------------------------  Indications: Left leg hip and knee pain.  Performing Technologist: Salvadore Farber RVT  Examination Guidelines: A complete evaluation includes B-mode imaging, spectral Doppler, color Doppler, and power Doppler as needed of all accessible portions of each vessel. Bilateral testing is considered an integral part of a complete examination. Limited examinations for reoccurring indications may be performed as noted. The reflux portion of the exam is performed with the patient in reverse Trendelenburg.  +-----+---------------+---------+-----------+----------+--------------+ RIGHTCompressibilityPhasicitySpontaneityPropertiesThrombus Aging +-----+---------------+---------+-----------+----------+--------------+ CFV  Full           Yes                                          +-----+---------------+---------+-----------+----------+--------------+ SFJ  Full           Yes                                           +-----+---------------+---------+-----------+----------+--------------+   +---------+---------------+---------+-----------+----------+--------------+ LEFT     CompressibilityPhasicitySpontaneityPropertiesThrombus Aging +---------+---------------+---------+-----------+----------+--------------+ CFV      Full           Yes                                          +---------+---------------+---------+-----------+----------+--------------+ SFJ      Full           Yes                                          +---------+---------------+---------+-----------+----------+--------------+ FV Prox  Full           Yes                                          +---------+---------------+---------+-----------+----------+--------------+ FV Mid   Full           Yes                                          +---------+---------------+---------+-----------+----------+--------------+ FV DistalFull           Yes                                          +---------+---------------+---------+-----------+----------+--------------+ PFV      Full           Yes                                          +---------+---------------+---------+-----------+----------+--------------+  POP      Full           Yes                                          +---------+---------------+---------+-----------+----------+--------------+ PTV      Full           Yes                                          +---------+---------------+---------+-----------+----------+--------------+ PERO     Full           Yes                                          +---------+---------------+---------+-----------+----------+--------------+ Gastroc  Full           Yes                                          +---------+---------------+---------+-----------+----------+--------------+ GSV      Full           Yes                                           +---------+---------------+---------+-----------+----------+--------------+ SSV      Full                                                        +---------+---------------+---------+-----------+----------+--------------+    Summary: RIGHT: - No evidence of common femoral vein obstruction.   LEFT: - No evidence of deep vein thrombosis in the lower extremity. No indirect evidence of obstruction proximal to the inguinal ligament.   *See table(s) above for measurements and observations. Electronically signed by Festus Barren MD on 06/17/2023 at 9:32:25 AM.    Final    VAS [place] LOWER EXTREMITY VENOUS (DVT)  Result Date: 06/02/2023  Lower Venous DVT Study Patient Name:  Kayla Chan  Date of Exam:   06/01/2023 Medical Rec #: [id number]       Accession #:    [id number] Date of Birth: [birth year]       Patient Gender: F Patient Age:   89 years Exam Location:  [facility] Procedure:      VAS [place] LOWER EXTREMITY VENOUS (DVT) Referring Phys: Sheppard Plumber --------------------------------------------------------------------------------  Indications: Pain.  Performing Technologist: Debbe Bales RVS  Examination Guidelines: A complete evaluation includes B-mode imaging, spectral Doppler, color Doppler, and power Doppler as needed of all accessible portions of each vessel. Bilateral testing is considered an integral part of a complete examination. Limited examinations for reoccurring indications may be performed as noted. The reflux portion of the exam is performed with the patient in reverse Trendelenburg.  +---------+---------------+---------+-----------+----------+--------------+ LEFT     CompressibilityPhasicitySpontaneityPropertiesThrombus Aging +---------+---------------+---------+-----------+----------+--------------+ CFV      Full  POP      Full           Yes                                          +---------+---------------+---------+-----------+----------+--------------+ PTV      Full           Yes                                          +---------+---------------+---------+-----------+----------+--------------+ PERO     Full           Yes                                          +---------+---------------+---------+-----------+----------+--------------+ Gastroc  Full           Yes                                          +---------+---------------+---------+-----------+----------+--------------+ GSV      Full           Yes                                           +---------+---------------+---------+-----------+----------+--------------+ SSV      Full                                                        +---------+---------------+---------+-----------+----------+--------------+    Summary: RIGHT: - No evidence of common femoral vein obstruction.   LEFT: - No evidence of deep vein thrombosis in the lower extremity. No indirect evidence of obstruction proximal to the inguinal ligament.   *See table(s) above for measurements and observations. Electronically signed by Festus Barren MD on 06/17/2023 at 9:32:25 AM.    Final    VAS [place] LOWER EXTREMITY VENOUS (DVT)  Result Date: 06/02/2023  Lower Venous DVT Study Patient Name:  Kayla Chan  Date of Exam:   06/01/2023 Medical Rec #: [id number]       Accession #:    [id number] Date of Birth: [birth year]       Patient Gender: F Patient Age:   89 years Exam Location:  [facility] Procedure:      VAS [place] LOWER EXTREMITY VENOUS (DVT) Referring Phys: Sheppard Plumber --------------------------------------------------------------------------------  Indications: Pain.  Performing Technologist: Debbe Bales RVS  Examination Guidelines: A complete evaluation includes B-mode imaging, spectral Doppler, color Doppler, and power Doppler as needed of all accessible portions of each vessel. Bilateral testing is considered an integral part of a complete examination. Limited examinations for reoccurring indications may be performed as noted. The reflux portion of the exam is performed with the patient in reverse Trendelenburg.  +---------+---------------+---------+-----------+----------+--------------+ LEFT     CompressibilityPhasicitySpontaneityPropertiesThrombus Aging +---------+---------------+---------+-----------+----------+--------------+ CFV      Full

## 2023-06-22 ENCOUNTER — Ambulatory Visit: Payer: PPO | Admitting: Physician Assistant

## 2023-06-22 ENCOUNTER — Encounter: Payer: Self-pay | Admitting: Physician Assistant

## 2023-06-22 VITALS — BP 177/68 | HR 80 | Temp 97.8°F | Ht 60.0 in | Wt 223.6 lb

## 2023-06-22 DIAGNOSIS — K921 Melena: Secondary | ICD-10-CM | POA: Diagnosis not present

## 2023-06-22 DIAGNOSIS — R195 Other fecal abnormalities: Secondary | ICD-10-CM | POA: Diagnosis not present

## 2023-06-22 DIAGNOSIS — R1013 Epigastric pain: Secondary | ICD-10-CM

## 2023-06-22 DIAGNOSIS — R109 Unspecified abdominal pain: Secondary | ICD-10-CM

## 2023-06-22 DIAGNOSIS — Z8719 Personal history of other diseases of the digestive system: Secondary | ICD-10-CM | POA: Diagnosis not present

## 2023-06-22 DIAGNOSIS — K5909 Other constipation: Secondary | ICD-10-CM

## 2023-06-22 DIAGNOSIS — K2100 Gastro-esophageal reflux disease with esophagitis, without bleeding: Secondary | ICD-10-CM | POA: Diagnosis not present

## 2023-06-22 DIAGNOSIS — K5904 Chronic idiopathic constipation: Secondary | ICD-10-CM

## 2023-06-22 MED ORDER — ESOMEPRAZOLE MAGNESIUM 20 MG PO CPDR
20.0000 mg | DELAYED_RELEASE_CAPSULE | Freq: Every day | ORAL | 3 refills | Status: AC
Start: 2023-06-22 — End: 2024-06-21

## 2023-06-22 MED ORDER — LINACLOTIDE 72 MCG PO CAPS: 72.0000 ug | ORAL_CAPSULE | Freq: Every day | ORAL | 3 refills | Status: DC

## 2023-06-23 ENCOUNTER — Other Ambulatory Visit: Payer: Self-pay

## 2023-06-23 ENCOUNTER — Telehealth: Payer: Self-pay

## 2023-06-23 DIAGNOSIS — R195 Other fecal abnormalities: Secondary | ICD-10-CM

## 2023-06-23 LAB — IRON,TIBC AND FERRITIN PANEL
Ferritin: 66 ng/mL (ref 15–150)
Iron Saturation: 11 % — ABNORMAL LOW (ref 15–55)
Iron: 31 ug/dL (ref 27–139)
Total Iron Binding Capacity: 281 ug/dL (ref 250–450)
UIBC: 250 ug/dL (ref 118–369)

## 2023-06-23 LAB — CBC WITH DIFFERENTIAL/PLATELET
Basophils Absolute: 0.1 10*3/uL (ref 0.0–0.2)
Basos: 1 %
EOS (ABSOLUTE): 0.3 10*3/uL (ref 0.0–0.4)
Eos: 4 %
Hematocrit: 29.5 % — ABNORMAL LOW (ref 34.0–46.6)
Hemoglobin: 8.8 g/dL — ABNORMAL LOW (ref 11.1–15.9)
Immature Grans (Abs): 0 10*3/uL (ref 0.0–0.1)
Immature Granulocytes: 0 %
Lymphocytes Absolute: 1.8 10*3/uL (ref 0.7–3.1)
Lymphs: 27 %
MCH: 28.8 pg (ref 26.6–33.0)
MCHC: 29.8 g/dL — ABNORMAL LOW (ref 31.5–35.7)
MCV: 96 fL (ref 79–97)
Monocytes Absolute: 0.6 10*3/uL (ref 0.1–0.9)
Monocytes: 8 %
Neutrophils Absolute: 4.2 10*3/uL (ref 1.4–7.0)
Neutrophils: 60 %
Platelets: 509 10*3/uL — ABNORMAL HIGH (ref 150–450)
RBC: 3.06 x10E6/uL — ABNORMAL LOW (ref 3.77–5.28)
RDW: 14 % (ref 11.7–15.4)
WBC: 7 10*3/uL (ref 3.4–10.8)

## 2023-06-23 NOTE — Progress Notes (Signed)
Call and notify patient and her husband her hemoglobin has dropped from 14.2 down to 8.8 in the past 3 months.  I am concerned that she may have had an upper GI bleed (stomach ulcer) because she was having black stools.  Total iron and iron saturation are slightly low.  I recommend she start OTC iron tablet ferrous sulfate 325 mg once daily.  I recommend schedule an EGD with Dr. Servando Snare as soon as possible.  We need to get permission to hold Xarelto prior to EGD procedure.  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  If she develops any more black tarry stools, weakness, shortness of breath, or worsening symptoms, then she should go to the ED.  Recommend avoid NSAIDs.  Specifically, please stop meloxicam (Mobic).  I recommend she stop aspirin if she is taking Xarelto.  Try to increase Nexium to 20 mg twice daily as this will treat possible stomach ulcer or upper GI bleed.  Repeat CBC, iron panel, and vitamin B12 in 1 week (diagnosis anemia).

## 2023-06-23 NOTE — Telephone Encounter (Signed)
Spoke with patient's  husband-  Xarelto clearance sent to Dr.Dew  EGD scheduled 07-13-23 Dr.Wohl [place] -  Call and notify patient and her husband her hemoglobin has dropped from 14.2 down to 8.8 in the past 3 months.  I am concerned that she may have had an upper GI bleed (stomach ulcer) because she was having black stools.  Total iron and iron saturation are slightly low.  I recommend she start OTC iron tablet ferrous sulfate 325 mg once daily.  I recommend schedule an EGD with Dr. Servando Snare as soon as possible.  We need to get permission to hold Xarelto prior to EGD procedure.  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  If she develops any more black tarry stools, weakness, shortness of breath, or worsening symptoms, then she should go to the ED.  Recommend avoid NSAIDs.  Specifically, please stop meloxicam (Mobic).  I recommend she stop aspirin if she is taking Xarelto.  Try to increase Nexium to 20 mg twice daily as this will treat possible stomach ulcer or upper GI bleed.  Repeat CBC, iron panel, and vitamin B12 in 1 week (diagnosis anemia).    Patient Communication

## 2023-06-23 NOTE — Telephone Encounter (Signed)
Spoke with Niece and she will have husband call once he gets home.   Call and notify patient and her husband her hemoglobin has dropped from 14.2 down to 8.8 in the past 3 months.  I am concerned that she may have had an upper GI bleed (stomach ulcer) because she was having black stools.  Total iron and iron saturation are slightly low.  I recommend she start OTC iron tablet ferrous sulfate 325 mg once daily.  I recommend schedule an EGD with Dr. Servando Snare as soon as possible.  We need to get permission to hold Xarelto prior to EGD procedure.  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  If she develops any more black tarry stools, weakness, shortness of breath, or worsening symptoms, then she should go to the ED.  Recommend avoid NSAIDs.  Specifically, please stop meloxicam (Mobic).  I recommend she stop aspirin if she is taking Xarelto.  Try to increase Nexium to 20 mg twice daily as this will treat possible stomach ulcer or upper GI bleed.  Repeat CBC, iron panel, and vitamin B12 in 1 week (diagnosis anemia).

## 2023-06-26 ENCOUNTER — Other Ambulatory Visit: Payer: PPO

## 2023-06-26 ENCOUNTER — Ambulatory Visit
Admission: RE | Admit: 2023-06-26 | Discharge: 2023-06-26 | Disposition: A | Discharge status: Home / Self Care | Payer: PPO | Source: Ambulatory Visit | Attending: Student | Admitting: Student

## 2023-06-26 DIAGNOSIS — Z96642 Presence of left artificial hip joint: Secondary | ICD-10-CM

## 2023-06-26 DIAGNOSIS — M25552 Pain in left hip: Secondary | ICD-10-CM

## 2023-06-28 NOTE — Progress Notes (Signed)
Subjective:    Patient ID: Kayla Chan, female    DOB: Jun 16, 1932, 89 y.o.   MRN: [id number] Chief Complaint  Patient presents with   Follow-up    3 week follow up with DVT    Kayla Chan is a 89 y.o. female.  She returns today for follow-up evaluation post intervention on 05/20/2023 including:  PROCEDURE: 1.   US guidance for vascular access to left popliteal vein 2.   Catheter placement into left iliac vein from left popliteal approach 3.   IVC gram and left lower extremity venogram 4.   Catheter directed thrombolysis with 6 mg of tpa to the left femoral veins 5.   Mechanical thrombectomy to the left popliteal vein, femoral vein, common femoral vein, and external iliac vein with the penumbra 16 flash device 6.   PTA of left external iliac vein and common femoral vein with 10 mm balloon 7.   Stent placement to the left external iliac vein with 14 mm diameter by 8 cm length Venovo stent  She recently underwent ultrasound on 06/16/2023 due to having continued pain in her left lower extremity.  It showed complete resolution of the DVT.  Her swelling is improved but her pain in her left leg has not.  She recently visited the emergency room and it is believed that her pain is related to her hip.  She does have an upcoming MRI for evaluation.  She continues to remain on Eliquis without any significant difficulty.  She still denies any chest pain or shortness of breath.      Review of Systems  Cardiovascular:  Positive for leg swelling.  Musculoskeletal:  Positive for arthralgias and gait problem.  All other systems reviewed and are negative.      Objective:   Physical Exam Vitals reviewed.  HENT:     Head: Normocephalic.  Cardiovascular:     Rate and Rhythm: Normal rate.  Pulmonary:     Effort: Pulmonary effort is normal.  Musculoskeletal:     Left lower leg: Edema present.  Skin:    General: Skin is warm and dry.  Neurological:     Mental Status: She is alert and  oriented to person, place, and time.  Psychiatric:        Mood and Affect: Mood normal.        Behavior: Behavior normal.        Thought Content: Thought content normal.        Judgment: Judgment normal.     BP (!) 161/77 (BP Location: Left Wrist)   Pulse 74   Ht 5' (1.524 m)   Wt 227 lb (103 kg)   BMI 44.33 kg/m   Past Medical History:  Diagnosis Date   Cancer (HCC)    BCC on nose   Carotid artery occlusion    CHF (congestive heart failure) (HCC)    Chronic kidney disease    stage III   Gout    Hyperlipidemia    Hypertension    Hypothyroidism    Lymphedema    Wears hearing aid in both ears     Social History   Socioeconomic History   Marital status: Married    Spouse name: Not on file   Number of children: 0   Years of education: Not on file   Highest education level: Some college, no degree  Occupational History   Occupation: Retired  Tobacco Use   Smoking status: Never   Smokeless tobacco: Never  Advertising account planner  Vaping status: Never Used  Substance and Sexual Activity   Alcohol use: Not Currently    Comment: 1 glass of wine seldom   Drug use: No   Sexual activity: Not on file  Other Topics Concern   Not on file  Social History Narrative   Not on file   Social Determinants of Health   Financial Resource Strain: Low Risk  (10/02/2020)   Overall Financial Resource Strain (CARDIA)    Difficulty of Paying Living Expenses: Not hard at all  Food Insecurity: No Food Insecurity (10/02/2020)   Hunger Vital Sign    Worried About Running Out of Food in the Last Year: Never true    Ran Out of Food in the Last Year: Never true  Transportation Needs: No Transportation Needs (10/02/2020)   PRAPARE - Administrator, Civil Service (Medical): No    Lack of Transportation (Non-Medical): No  Physical Activity: Inactive (10/02/2020)   Exercise Vital Sign    Days of Exercise per Week: 0 days    Minutes of Exercise per Session: 0 min  Stress: No Stress Concern  Present (10/02/2020)   [employer] of Occupational Health - Occupational Stress Questionnaire    Feeling of Stress : Not at all  Social Connections: Moderately Integrated (10/02/2020)   Social Connection and Isolation Panel [NHANES]    Frequency of Communication with Friends and Family: More than three times a week    Frequency of Social Gatherings with Friends and Family: More than three times a week    Attends Religious Services: More than 4 times per year    Active Member of [employer] or Organizations: No    Attends Banker Meetings: Never    Marital Status: Married  Catering manager Violence: Not At Risk (10/02/2020)   Humiliation, Afraid, Rape, and Kick questionnaire    Fear of Current or Ex-Partner: No    Emotionally Abused: No    Physically Abused: No    Sexually Abused: No    Past Surgical History:  Procedure Laterality Date   24 hour Holter Monitor  01/2013   4550 ectopic beats with 344 superventricular bigemminy events   Abdominal Ultrasound  05/31/2013   RUQ. Fatty Liver   APPENDECTOMY  1950   [place]   CATARACT EXTRACTION W/PHACO Left 07/31/2022   Procedure: CATARACT EXTRACTION PHACO AND INTRAOCULAR LENS PLACEMENT (IOC) LEFT;  Surgeon: Galen Manila, MD;  Location: [facility];  Service: Ophthalmology;  Laterality: Left;  11.46 1:14.3   PERIPHERAL VASCULAR THROMBECTOMY Left 05/20/2023   Procedure: PERIPHERAL VASCULAR THROMBECTOMY;  Surgeon: Annice Needy, MD;  Location: [facility];  Service: Cardiovascular;  Laterality: Left;   REPLACEMENT TOTAL KNEE  09/10/2011   Inpatient, [facility], [place]; Right    Family History  Problem Relation Age of Onset   Stroke Mother        possible stroke   Heart Problems Father    Kidney cancer Sister    Bladder Cancer Brother     Allergies  Allergen Reactions   Lisinopril     Intense itching per patient can not tolerate    Amlodipine     dizziness   Atorvastatin     Other  reaction(s): Muscle Pain   Bactrim [Sulfamethoxazole-Trimethoprim] Itching   Ciprofloxacin     dizziness   Colestid  [Colestipol Hcl]     Itching, insomnia   Colestipol Other (See Comments)    insomnia   Crestor  [Rosuvastatin Calcium]  Other reaction(s): Muscle Pain   Doxycycline    Fluvastatin Sodium     Other reaction(s): Muscle Pain   Furosemide     Severe Cramping   Labetalol Other (See Comments)    Dizziness   Losartan     itching   Lotensin [Benazepril]     unknown   Pravastatin Sodium     Numbness in legs   Prednisone     dizziness   Rosuvastatin     Muscle pain   Verapamil     Shortness of breath, swelling, palpations.    Allopurinol Other (See Comments)    dizziness   Penicillins Rash    Took for pneumonia when she was 23 and mader her mouth break out       Latest Ref Rng & Units 06/22/2023    2:01 PM 03/02/2023   10:01 PM 04/30/2022    2:33 PM  CBC  WBC 3.4 - 10.8 x10E3/uL 7.0  8.5  10.5   Hemoglobin 11.1 - 15.9 g/dL 8.8  16.1  09.6   Hematocrit 34.0 - 46.6 % 29.5  44.2  40.7   Platelets 150 - 450 x10E3/uL 509  318  353       CMP     Component Value Date/Time   NA 136 03/02/2023 2201   NA 142 08/19/2020 1358   NA 137 08/05/2013 1238   K 3.9 03/02/2023 2201   K 3.8 08/05/2013 1238   CL 106 03/02/2023 2201   CL 104 08/05/2013 1238   CO2 20 (L) 03/02/2023 2201   CO2 26 08/05/2013 1238   GLUCOSE 106 (H) 03/02/2023 2201   GLUCOSE 125 (H) 08/05/2013 1238   BUN 15 05/20/2023 1024   BUN 23 08/19/2020 1358   BUN 19 (H) 08/05/2013 1238   CREATININE 1.03 (H) 05/20/2023 1024   CREATININE 1.10 08/05/2013 1238   CALCIUM 9.0 03/02/2023 2201   CALCIUM 9.5 08/05/2013 1238   PROT 6.6 03/02/2023 2201   PROT 6.4 08/19/2020 1358   PROT 7.7 08/05/2013 1238   ALBUMIN 3.8 03/02/2023 2201   ALBUMIN 3.8 08/19/2020 1358   ALBUMIN 3.5 08/05/2013 1238   AST 17 03/02/2023 2201   AST 10 (L) 08/05/2013 1238   ALT 10 03/02/2023 2201   ALT 16 08/05/2013 1238    ALKPHOS 74 03/02/2023 2201   ALKPHOS 82 08/05/2013 1238   BILITOT 0.8 03/02/2023 2201   BILITOT 0.3 08/19/2020 1358   BILITOT 0.4 08/05/2013 1238   GFRNONAA 52 (L) 05/20/2023 1024   GFRNONAA 48 (L) 08/05/2013 1238     No results found.     Assessment & Plan:   1. Acute deep vein thrombosis (DVT) of femoral vein of left lower extremity (HCC) DVT study done on 06/16/2023 shows no evidence of thrombus remaining post thrombectomy.  The patient has been on Eliquis for 1 month and given the extent of her DVT would recommend that she remain for at least the next 5 months for a total of 6.  Will have the patient return then to evaluate her progress of leg swelling.  Based upon the patient's leg pain I do not feel that this is postphlebitic symptoms but rather orthopedic in nature.  She will continue to follow-up with her orthopedic specialist regarding her hip pain. 2. Essential hypertension Continue antihypertensive medications as already ordered, these medications have been reviewed and there are no changes at this time.   Current Outpatient Medications on File Prior to Visit  Medication Sig Dispense Refill  acetaminophen (TYLENOL) 325 MG tablet Take 2 tablets (650 mg total) by mouth every 6 (six) hours as needed for mild pain (or Fever >/= 101).     Ascorbic Acid (VITAMIN C PO) Take by mouth daily.     aspirin 81 MG tablet Take 81 mg by mouth daily.     chlorthalidone (HYGROTON) 25 MG tablet Take 0.5 tablets (12.5 mg total) by mouth daily. 45 tablet 1   cloNIDine (CATAPRES) 0.1 MG tablet Take by mouth.     cyanocobalamin (VITAMIN B12) 1000 MCG tablet Take by mouth.     diltiazem (CARDIZEM) 120 MG tablet Take 120 mg by mouth 4 (four) times daily.     docusate sodium (COLACE) 100 MG capsule Take 100 mg by mouth 2 (two) times daily.     ezetimibe (ZETIA) 10 MG tablet Take 1 tablet (10 mg total) by mouth daily. 90 tablet 1   hydrALAZINE (APRESOLINE) 25 MG tablet Take 25 mg by mouth 3  (three) times daily.     HYDROcodone-acetaminophen (NORCO) 5-325 MG tablet Take 1 tablet by mouth every 4 (four) hours as needed for moderate pain. 6 tablet 0   lactulose (CHRONULAC) 10 GM/15ML solution Take by mouth 3 (three) times daily.     levothyroxine (SYNTHROID) 75 MCG tablet TAKE 1 TABLET BY MOUTH EVERY DAY 90 tablet 2   meloxicam (MOBIC) 15 MG tablet Take 1 tablet (15 mg total) by mouth daily. 30 tablet 2   Multiple Vitamin (MULTIVITAMIN) tablet Take 1 tablet by mouth daily.     rivaroxaban (XARELTO) 20 MG TABS tablet Take 1 tablet (20 mg total) by mouth daily with supper. 30 tablet 6   sucralfate (CARAFATE) 1 g tablet Take 1 tablet (1 g total) by mouth 3 (three) times daily before meals. 90 tablet 2   No current facility-administered medications on file prior to visit.    There are no Patient Instructions on file for this visit. No follow-ups on file.   Georgiana Spinner, NP

## 2023-06-30 ENCOUNTER — Telehealth: Payer: Self-pay | Admitting: Physician Assistant

## 2023-06-30 ENCOUNTER — Other Ambulatory Visit: Payer: Self-pay

## 2023-06-30 DIAGNOSIS — D649 Anemia, unspecified: Secondary | ICD-10-CM

## 2023-06-30 NOTE — Telephone Encounter (Signed)
The patient wanted to know can she switch from Esomeprazole 40 MG to 20 MG.

## 2023-06-30 NOTE — Telephone Encounter (Signed)
Left detailed message on patient's VM   The patient wanted to know can she switch from Esomeprazole 40 MG to 20 MG.    NO.  I am worried that she may have a bleeding stomach ulcer which is causing her hemoglobin to drop.  She must stay on Nexium 40 mg once daily to treat possible stomach ulcer.  Celso Amy, PA-C

## 2023-07-01 ENCOUNTER — Telehealth: Payer: Self-pay

## 2023-07-01 LAB — CBC WITH DIFFERENTIAL/PLATELET
Basophils Absolute: 0.1 10*3/uL (ref 0.0–0.2)
Basos: 1 %
EOS (ABSOLUTE): 0.2 10*3/uL (ref 0.0–0.4)
Eos: 2 %
Hematocrit: 30.2 % — ABNORMAL LOW (ref 34.0–46.6)
Hemoglobin: 9 g/dL — ABNORMAL LOW (ref 11.1–15.9)
Immature Grans (Abs): 0 10*3/uL (ref 0.0–0.1)
Immature Granulocytes: 0 %
Lymphocytes Absolute: 2.4 10*3/uL (ref 0.7–3.1)
Lymphs: 28 %
MCH: 28.2 pg (ref 26.6–33.0)
MCHC: 29.8 g/dL — ABNORMAL LOW (ref 31.5–35.7)
MCV: 95 fL (ref 79–97)
Monocytes Absolute: 0.9 10*3/uL (ref 0.1–0.9)
Monocytes: 10 %
Neutrophils Absolute: 5.1 10*3/uL (ref 1.4–7.0)
Neutrophils: 59 %
Platelets: 494 10*3/uL — ABNORMAL HIGH (ref 150–450)
RBC: 3.19 x10E6/uL — ABNORMAL LOW (ref 3.77–5.28)
RDW: 13.9 % (ref 11.7–15.4)
WBC: 8.6 10*3/uL (ref 3.4–10.8)

## 2023-07-01 LAB — IRON,TIBC AND FERRITIN PANEL
Ferritin: 70 ng/mL (ref 15–150)
Iron Saturation: 57 % — ABNORMAL HIGH (ref 15–55)
Iron: 163 ug/dL — ABNORMAL HIGH (ref 27–139)
Total Iron Binding Capacity: 285 ug/dL (ref 250–450)
UIBC: 122 ug/dL (ref 118–369)

## 2023-07-01 LAB — VITAMIN B12: Vitamin B-12: 410 pg/mL (ref 232–1245)

## 2023-07-01 NOTE — Telephone Encounter (Signed)
Spoke with patient- she wants to cancel the EGD but I let her know Inetta Fermo would like for her to continue with EGD to be sure there is no ulcer.    Call and notify patient her labs show:  1.  Hemoglobin has improved from 8.8 to 9.0.  Hemoglobin is stable.  2.  Iron has improved.  3.  Vitamin B12 is normal.  -I recommend continue with plan for EGD as scheduled.  -Stop iron 5 days before EGD procedure.  -Schedule follow-up office visit appointment with me in December to follow-up with anemia.  Celso Amy, PA-C

## 2023-07-01 NOTE — Progress Notes (Signed)
Call and notify patient her labs show: 1.  Hemoglobin has improved from 8.8 to 9.0.  Hemoglobin is stable. 2.  Iron has improved. 3.  Vitamin B12 is normal. -I recommend continue with plan for EGD as scheduled. -Stop iron 5 days before EGD procedure. -Schedule follow-up office visit appointment with me in December to follow-up with anemia. Celso Amy, PA-C

## 2023-07-05 ENCOUNTER — Encounter: Payer: Self-pay | Admitting: Anesthesiology

## 2023-07-05 NOTE — Telephone Encounter (Addendum)
LVM for pt to return my call regarding receiving blood thinner request from Dr. Festus Barren. Ok, to let pt know she can stop Xarelto 2 days prior to EGD and resume 1 day after.

## 2023-07-06 ENCOUNTER — Encounter: Payer: Self-pay | Admitting: Gastroenterology

## 2023-07-07 DIAGNOSIS — W19XXXA Unspecified fall, initial encounter: Secondary | ICD-10-CM | POA: Diagnosis not present

## 2023-07-07 DIAGNOSIS — S99812A Other specified injuries of left ankle, initial encounter: Secondary | ICD-10-CM | POA: Diagnosis not present

## 2023-07-08 DIAGNOSIS — M25572 Pain in left ankle and joints of left foot: Secondary | ICD-10-CM | POA: Diagnosis not present

## 2023-07-08 DIAGNOSIS — S93402A Sprain of unspecified ligament of left ankle, initial encounter: Secondary | ICD-10-CM | POA: Diagnosis not present

## 2023-07-09 ENCOUNTER — Emergency Department: Payer: PPO

## 2023-07-09 ENCOUNTER — Other Ambulatory Visit: Payer: Self-pay

## 2023-07-09 ENCOUNTER — Inpatient Hospital Stay
Admission: EM | Admit: 2023-07-09 | Discharge: 2023-07-16 | DRG: 562 | Disposition: A | Discharge status: Skilled Nursing Facility | Payer: PPO | Attending: Internal Medicine | Admitting: Internal Medicine

## 2023-07-09 DIAGNOSIS — K5909 Other constipation: Secondary | ICD-10-CM | POA: Diagnosis present

## 2023-07-09 DIAGNOSIS — I10 Essential (primary) hypertension: Secondary | ICD-10-CM | POA: Diagnosis present

## 2023-07-09 DIAGNOSIS — D509 Iron deficiency anemia, unspecified: Secondary | ICD-10-CM | POA: Diagnosis present

## 2023-07-09 DIAGNOSIS — E039 Hypothyroidism, unspecified: Secondary | ICD-10-CM | POA: Diagnosis present

## 2023-07-09 DIAGNOSIS — S92341A Displaced fracture of fourth metatarsal bone, right foot, initial encounter for closed fracture: Secondary | ICD-10-CM | POA: Diagnosis not present

## 2023-07-09 DIAGNOSIS — I130 Hypertensive heart and chronic kidney disease with heart failure and stage 1 through stage 4 chronic kidney disease, or unspecified chronic kidney disease: Secondary | ICD-10-CM | POA: Diagnosis present

## 2023-07-09 DIAGNOSIS — Z79899 Other long term (current) drug therapy: Secondary | ICD-10-CM

## 2023-07-09 DIAGNOSIS — Z6841 Body Mass Index (BMI) 40.0 and over, adult: Secondary | ICD-10-CM

## 2023-07-09 DIAGNOSIS — S92351A Displaced fracture of fifth metatarsal bone, right foot, initial encounter for closed fracture: Secondary | ICD-10-CM | POA: Diagnosis present

## 2023-07-09 DIAGNOSIS — S92354A Nondisplaced fracture of fifth metatarsal bone, right foot, initial encounter for closed fracture: Secondary | ICD-10-CM | POA: Diagnosis not present

## 2023-07-09 DIAGNOSIS — K5900 Constipation, unspecified: Secondary | ICD-10-CM | POA: Diagnosis present

## 2023-07-09 DIAGNOSIS — R531 Weakness: Secondary | ICD-10-CM | POA: Diagnosis not present

## 2023-07-09 DIAGNOSIS — Z9842 Cataract extraction status, left eye: Secondary | ICD-10-CM

## 2023-07-09 DIAGNOSIS — R000 Tachycardia, unspecified: Secondary | ICD-10-CM | POA: Diagnosis present

## 2023-07-09 DIAGNOSIS — Z86718 Personal history of other venous thrombosis and embolism: Secondary | ICD-10-CM

## 2023-07-09 DIAGNOSIS — R54 Age-related physical debility: Secondary | ICD-10-CM | POA: Diagnosis present

## 2023-07-09 DIAGNOSIS — Z90710 Acquired absence of both cervix and uterus: Secondary | ICD-10-CM

## 2023-07-09 DIAGNOSIS — I5023 Acute on chronic systolic (congestive) heart failure: Secondary | ICD-10-CM | POA: Diagnosis present

## 2023-07-09 DIAGNOSIS — E785 Hyperlipidemia, unspecified: Secondary | ICD-10-CM | POA: Diagnosis present

## 2023-07-09 DIAGNOSIS — S9031XA Contusion of right foot, initial encounter: Secondary | ICD-10-CM | POA: Diagnosis not present

## 2023-07-09 DIAGNOSIS — R262 Difficulty in walking, not elsewhere classified: Secondary | ICD-10-CM | POA: Diagnosis not present

## 2023-07-09 DIAGNOSIS — Z961 Presence of intraocular lens: Secondary | ICD-10-CM | POA: Diagnosis present

## 2023-07-09 DIAGNOSIS — Z8051 Family history of malignant neoplasm of kidney: Secondary | ICD-10-CM

## 2023-07-09 DIAGNOSIS — M79605 Pain in left leg: Secondary | ICD-10-CM | POA: Diagnosis not present

## 2023-07-09 DIAGNOSIS — M2011 Hallux valgus (acquired), right foot: Secondary | ICD-10-CM | POA: Diagnosis not present

## 2023-07-09 DIAGNOSIS — M170 Bilateral primary osteoarthritis of knee: Secondary | ICD-10-CM | POA: Diagnosis present

## 2023-07-09 DIAGNOSIS — Y92009 Unspecified place in unspecified non-institutional (private) residence as the place of occurrence of the external cause: Secondary | ICD-10-CM

## 2023-07-09 DIAGNOSIS — S93402A Sprain of unspecified ligament of left ankle, initial encounter: Secondary | ICD-10-CM | POA: Diagnosis present

## 2023-07-09 DIAGNOSIS — Z7901 Long term (current) use of anticoagulants: Secondary | ICD-10-CM

## 2023-07-09 DIAGNOSIS — S92344A Nondisplaced fracture of fourth metatarsal bone, right foot, initial encounter for closed fracture: Secondary | ICD-10-CM | POA: Diagnosis not present

## 2023-07-09 DIAGNOSIS — I82409 Acute embolism and thrombosis of unspecified deep veins of unspecified lower extremity: Secondary | ICD-10-CM | POA: Diagnosis present

## 2023-07-09 DIAGNOSIS — I700 Atherosclerosis of aorta: Secondary | ICD-10-CM | POA: Diagnosis not present

## 2023-07-09 DIAGNOSIS — Z85828 Personal history of other malignant neoplasm of skin: Secondary | ICD-10-CM

## 2023-07-09 DIAGNOSIS — W19XXXA Unspecified fall, initial encounter: Secondary | ICD-10-CM | POA: Diagnosis present

## 2023-07-09 DIAGNOSIS — Z881 Allergy status to other antibiotic agents status: Secondary | ICD-10-CM

## 2023-07-09 DIAGNOSIS — Z880 Allergy status to penicillin: Secondary | ICD-10-CM

## 2023-07-09 DIAGNOSIS — N1831 Chronic kidney disease, stage 3a: Secondary | ICD-10-CM | POA: Diagnosis present

## 2023-07-09 DIAGNOSIS — Z791 Long term (current) use of non-steroidal anti-inflammatories (NSAID): Secondary | ICD-10-CM

## 2023-07-09 DIAGNOSIS — M25562 Pain in left knee: Secondary | ICD-10-CM | POA: Diagnosis not present

## 2023-07-09 DIAGNOSIS — I2510 Atherosclerotic heart disease of native coronary artery without angina pectoris: Secondary | ICD-10-CM | POA: Diagnosis present

## 2023-07-09 DIAGNOSIS — J189 Pneumonia, unspecified organism: Secondary | ICD-10-CM

## 2023-07-09 DIAGNOSIS — Z7982 Long term (current) use of aspirin: Secondary | ICD-10-CM

## 2023-07-09 DIAGNOSIS — D72829 Elevated white blood cell count, unspecified: Secondary | ICD-10-CM

## 2023-07-09 DIAGNOSIS — Z8052 Family history of malignant neoplasm of bladder: Secondary | ICD-10-CM

## 2023-07-09 DIAGNOSIS — Z96653 Presence of artificial knee joint, bilateral: Secondary | ICD-10-CM | POA: Diagnosis present

## 2023-07-09 DIAGNOSIS — Z823 Family history of stroke: Secondary | ICD-10-CM

## 2023-07-09 DIAGNOSIS — Z974 Presence of external hearing-aid: Secondary | ICD-10-CM

## 2023-07-09 DIAGNOSIS — Z96642 Presence of left artificial hip joint: Secondary | ICD-10-CM | POA: Diagnosis present

## 2023-07-09 DIAGNOSIS — Z888 Allergy status to other drugs, medicaments and biological substances status: Secondary | ICD-10-CM

## 2023-07-09 DIAGNOSIS — E66813 Obesity, class 3: Secondary | ICD-10-CM | POA: Diagnosis present

## 2023-07-09 DIAGNOSIS — Z9181 History of falling: Secondary | ICD-10-CM

## 2023-07-09 DIAGNOSIS — R296 Repeated falls: Secondary | ICD-10-CM | POA: Diagnosis present

## 2023-07-09 DIAGNOSIS — S92334A Nondisplaced fracture of third metatarsal bone, right foot, initial encounter for closed fracture: Secondary | ICD-10-CM | POA: Diagnosis present

## 2023-07-09 DIAGNOSIS — R0989 Other specified symptoms and signs involving the circulatory and respiratory systems: Secondary | ICD-10-CM | POA: Diagnosis present

## 2023-07-09 DIAGNOSIS — I517 Cardiomegaly: Secondary | ICD-10-CM | POA: Diagnosis not present

## 2023-07-09 LAB — CBC WITH DIFFERENTIAL/PLATELET
Abs Immature Granulocytes: 0.08 10*3/uL — ABNORMAL HIGH (ref 0.00–0.07)
Basophils Absolute: 0 10*3/uL (ref 0.0–0.1)
Basophils Relative: 0 %
Eosinophils Absolute: 0 10*3/uL (ref 0.0–0.5)
Eosinophils Relative: 0 %
HCT: 31.8 % — ABNORMAL LOW (ref 36.0–46.0)
Hemoglobin: 9.6 g/dL — ABNORMAL LOW (ref 12.0–15.0)
Immature Granulocytes: 1 %
Lymphocytes Relative: 7 %
Lymphs Abs: 0.9 10*3/uL (ref 0.7–4.0)
MCH: 28.9 pg (ref 26.0–34.0)
MCHC: 30.2 g/dL (ref 30.0–36.0)
MCV: 95.8 fL (ref 80.0–100.0)
Monocytes Absolute: 1 10*3/uL (ref 0.1–1.0)
Monocytes Relative: 8 %
Neutro Abs: 10.2 10*3/uL — ABNORMAL HIGH (ref 1.7–7.7)
Neutrophils Relative %: 84 %
Platelets: 384 10*3/uL (ref 150–400)
RBC: 3.32 MIL/uL — ABNORMAL LOW (ref 3.87–5.11)
RDW: 15.9 % — ABNORMAL HIGH (ref 11.5–15.5)
WBC: 12.3 10*3/uL — ABNORMAL HIGH (ref 4.0–10.5)
nRBC: 0 % (ref 0.0–0.2)

## 2023-07-09 LAB — COMPREHENSIVE METABOLIC PANEL
ALT: 10 U/L (ref 0–44)
AST: 19 U/L (ref 15–41)
Albumin: 3.1 g/dL — ABNORMAL LOW (ref 3.5–5.0)
Alkaline Phosphatase: 86 U/L (ref 38–126)
Anion gap: 11 (ref 5–15)
BUN: 18 mg/dL (ref 8–23)
CO2: 20 mmol/L — ABNORMAL LOW (ref 22–32)
Calcium: 8.6 mg/dL — ABNORMAL LOW (ref 8.9–10.3)
Chloride: 104 mmol/L (ref 98–111)
Creatinine, Ser: 1.11 mg/dL — ABNORMAL HIGH (ref 0.44–1.00)
GFR, Estimated: 48 mL/min — ABNORMAL LOW (ref >60)
Glucose, Bld: 141 mg/dL — ABNORMAL HIGH (ref 70–99)
Potassium: 3.5 mmol/L (ref 3.5–5.1)
Sodium: 135 mmol/L (ref 135–145)
Total Bilirubin: 0.8 mg/dL (ref <1.2)
Total Protein: 6.5 g/dL (ref 6.5–8.1)

## 2023-07-09 LAB — PROCALCITONIN: Procalcitonin: 0.21 ng/mL

## 2023-07-09 MED ORDER — CEFTRIAXONE SODIUM 1 G IJ SOLR
1.0000 g | Freq: Once | INTRAMUSCULAR | Status: AC
  Administered 2023-07-09: 1 g via INTRAVENOUS
  Filled 2023-07-09: qty 10

## 2023-07-09 MED ORDER — LINACLOTIDE 72 MCG PO CAPS
72.0000 ug | ORAL_CAPSULE | Freq: Every day | ORAL | Status: DC
Start: 2023-07-10 — End: 2023-07-16
  Administered 2023-07-10 – 2023-07-16 (×6): 72 ug via ORAL
  Filled 2023-07-09 (×7): qty 1

## 2023-07-09 MED ORDER — ONDANSETRON HCL 4 MG/2ML IJ SOLN
4.0000 mg | Freq: Four times a day (QID) | INTRAMUSCULAR | Status: DC | PRN
  Administered 2023-07-09 – 2023-07-11 (×2): 4 mg via INTRAVENOUS
  Filled 2023-07-09 (×2): qty 2

## 2023-07-09 MED ORDER — PANTOPRAZOLE SODIUM 40 MG PO TBEC
40.0000 mg | DELAYED_RELEASE_TABLET | Freq: Every day | ORAL | Status: DC
  Administered 2023-07-09 – 2023-07-16 (×8): 40 mg via ORAL
  Filled 2023-07-09 (×8): qty 1

## 2023-07-09 MED ORDER — RIVAROXABAN 20 MG PO TABS: 20.0000 mg | ORAL_TABLET | Freq: Every day | ORAL | Status: DC

## 2023-07-09 MED ORDER — LACTULOSE 10 GM/15ML PO SOLN: 30.0000 g | Freq: Two times a day (BID) | ORAL | Status: DC | PRN

## 2023-07-09 MED ORDER — EZETIMIBE 10 MG PO TABS
10.0000 mg | ORAL_TABLET | Freq: Every day | ORAL | Status: DC
  Administered 2023-07-10 – 2023-07-16 (×7): 10 mg via ORAL
  Filled 2023-07-09 (×7): qty 1

## 2023-07-09 MED ORDER — LEVOTHYROXINE SODIUM 50 MCG PO TABS
75.0000 ug | ORAL_TABLET | Freq: Every day | ORAL | Status: DC
  Administered 2023-07-10 – 2023-07-16 (×7): 75 ug via ORAL
  Filled 2023-07-09 (×2): qty 1
  Filled 2023-07-09 (×3): qty 2
  Filled 2023-07-09: qty 1
  Filled 2023-07-09: qty 2

## 2023-07-09 MED ORDER — AZITHROMYCIN 500 MG IV SOLR
500.0000 mg | Freq: Once | INTRAVENOUS | Status: AC
  Administered 2023-07-09: 500 mg via INTRAVENOUS
  Filled 2023-07-09: qty 5

## 2023-07-09 MED ORDER — ONDANSETRON HCL 4 MG PO TABS: 4.0000 mg | ORAL_TABLET | Freq: Four times a day (QID) | ORAL | Status: DC | PRN

## 2023-07-09 MED ORDER — HYDROMORPHONE HCL 1 MG/ML IJ SOLN
0.5000 mg | INTRAMUSCULAR | Status: DC | PRN
  Administered 2023-07-09 – 2023-07-13 (×6): 0.5 mg via INTRAVENOUS
  Filled 2023-07-09 (×8): qty 0.5

## 2023-07-09 MED ORDER — RIVAROXABAN 20 MG PO TABS
20.0000 mg | ORAL_TABLET | Freq: Every day | ORAL | Status: DC
  Administered 2023-07-09 – 2023-07-15 (×7): 20 mg via ORAL
  Filled 2023-07-09 (×8): qty 1

## 2023-07-09 MED ORDER — ACETAMINOPHEN 325 MG PO TABS
650.0000 mg | ORAL_TABLET | Freq: Four times a day (QID) | ORAL | Status: DC | PRN
  Administered 2023-07-09: 650 mg via ORAL
  Filled 2023-07-09: qty 2

## 2023-07-09 MED ORDER — ENOXAPARIN SODIUM 40 MG/0.4ML IJ SOSY
40.0000 mg | PREFILLED_SYRINGE | INTRAMUSCULAR | Status: DC
  Administered 2023-07-09: 40 mg via SUBCUTANEOUS
  Filled 2023-07-09: qty 0.4

## 2023-07-09 MED ORDER — DOCUSATE SODIUM 100 MG PO CAPS
100.0000 mg | ORAL_CAPSULE | Freq: Two times a day (BID) | ORAL | Status: DC
  Administered 2023-07-10 – 2023-07-16 (×11): 100 mg via ORAL
  Filled 2023-07-09 (×12): qty 1

## 2023-07-09 MED ORDER — BUMETANIDE 0.25 MG/ML IJ SOLN
0.5000 mg | Freq: Once | INTRAMUSCULAR | Status: AC
  Administered 2023-07-09: 0.5 mg via INTRAVENOUS
  Filled 2023-07-09: qty 4

## 2023-07-09 MED ORDER — HYDRALAZINE HCL 20 MG/ML IJ SOLN
5.0000 mg | Freq: Four times a day (QID) | INTRAMUSCULAR | Status: DC | PRN
  Administered 2023-07-10 – 2023-07-16 (×2): 5 mg via INTRAVENOUS
  Filled 2023-07-09 (×2): qty 1

## 2023-07-09 MED ORDER — LACTULOSE 10 GM/15ML PO SOLN
30.0000 g | ORAL | Status: AC
  Administered 2023-07-09 (×2): 30 g via ORAL
  Filled 2023-07-09 (×2): qty 60

## 2023-07-09 NOTE — ED Provider Triage Note (Signed)
Emergency Medicine Provider Triage Evaluation Note  Kayla Chan , a 89 y.o. female  was evaluated in triage.  Pt complains of fall and pain to right lower extremity. Had a fall recently and sprained left extremity.  Review of Systems  Positive: Pain to RLE, weakness, light headed Negative: Knee pain  Physical Exam  There were no vitals taken for this visit. Gen:   Awake, no distress   Resp:  Normal effort  MSK:   Moves extremities without difficulty  Other:  Right foot and ankle with bruising and swelling  Medical Decision Making  Medically screening exam initiated at 11:23 AM.  Appropriate orders placed.  Teague Posten was informed that the remainder of the evaluation will be completed by another provider, this initial triage assessment does not replace that evaluation, and the importance of remaining in the ED until their evaluation is complete.  Cameron Ali, PA-C 07/09/23 1128

## 2023-07-09 NOTE — ED Triage Notes (Signed)
Pt to ED for fall today, impaired mobility from boot and fracture on left foot. C/o pain to right foot. Bruising noted. +dizziness. Witnessed fall, did not hit head.

## 2023-07-09 NOTE — H&P (Addendum)
History and Physical    [name] VWU:[id number] DOB: Dec 18, 1931 DOA: 07/09/2023  PCP: Mick Sell, MD (Confirm with patient/family/NH records and if not entered, this has to be entered at [facility] point of entry) Patient coming from: Home  I have personally briefly reviewed patient's old medical records in [facility]  Chief Complaint: Larey Seat and broke my foot  HPI: Kayla Chan is a 89 y.o. female with medical history significant of chronic ambulation dysfunction secondary to bilateral severe knee OA on roller walker at baseline, chronic HFrEF with LVEF 40-50%, CKD stage III, DVT on Xarelto, HTN, HLD, hypothyroidism, peptic ulcer on PPI, came from home for fall and right foot pain.  With severe bilateral knee OA, patient uses roller walker to ambulate at baseline for the last 3 years.  She has unsteady gait at baseline.  And 3 weeks ago she fell and twisted left ankle.  Then she went to see urgent care and x-ray showed no fracture or dislocation and patient was given a cam boot and sent home.  Today, patient fell again and this time on the right foot, excruciating pain and she could not stand on her feet again.  Denied any head or neck injury, no LOC denied any prodrome of lightheadedness.  1 month ago patient started to have epigastric pain, happens only at night, around midnight, is cramping-like epigastric pain lasted 1 hour, and she went to see GI, and was suspected to have a recurrent peptic ulcer and she was started on PPI and Carafate and iron supplement.  Since then her stool color has turned black.  She also complains about iron pill makes her constipated and her last bowel movement was 3 days ago but she denied any abdominal pain now.  She said overall after she started on PPI her nocturnal abdominal pain has subsided.  She is scheduled to have EGD in 3 weeks.  She denied any dysphagia, cough or choking after eating or drinking.  She has no cough or chest pain renal and she  has no fever or chills at home.  ED Course: Afebrile, none tachycardia blood pressure slightly elevated O2 saturation 100% on room air.  X-ray showed fifth metatarsal fracture.  X-ray showed mild pulmonary congestion.  Hemoglobin 9.6, WBC 12.3, creatinine 1.1, bicarb 24 K 3.5.  Patient was given ceftriaxone and azithromycin in the ED.  Review of Systems: As per HPI otherwise 14 point review of systems negative.    Past Medical History:  Diagnosis Date   Cancer (HCC)    BCC on nose   Carotid artery occlusion    CHF (congestive heart failure) (HCC)    Chronic kidney disease    stage III   DVT, femoral, acute (HCC) 05/20/2023   Thrombectomy with stent placement   Gout    Hyperlipidemia    Hypertension    Hypothyroidism    Lymphedema    Wears hearing aid in both ears     Past Surgical History:  Procedure Laterality Date   24 hour Holter Monitor  01/22/2013   4550 ectopic beats with 344 superventricular bigemminy events   ABDOMINAL HYSTERECTOMY     Abdominal Ultrasound  05/31/2013   RUQ. Fatty Liver   APPENDECTOMY  08/24/1948   [place]   CATARACT EXTRACTION W/PHACO Left 07/31/2022   Procedure: CATARACT EXTRACTION PHACO AND INTRAOCULAR LENS PLACEMENT (IOC) LEFT;  Surgeon: Galen Manila, MD;  Location: [facility];  Service: Ophthalmology;  Laterality: Left;  11.46 1:14.3   HARDWARE REMOVAL  Right 04/21/2012   knee   HERNIA REPAIR     PERIPHERAL VASCULAR THROMBECTOMY Left 05/20/2023   Procedure: PERIPHERAL VASCULAR THROMBECTOMY;  Surgeon: Annice Needy, MD;  Location: [facility] INVASIVE CV LAB;  Service: Cardiovascular;  Laterality: Left;   REPLACEMENT TOTAL KNEE Right 09/10/2011   Inpatient, [employer], [place]; Right   TOTAL HIP ARTHROPLASTY Left 10/12/2008   TOTAL KNEE ARTHROPLASTY Left 05/20/2006     reports that she has never smoked. She has never used smokeless tobacco. She reports that she does not currently use alcohol. She reports that she does not  use drugs.  Allergies  Allergen Reactions   Lisinopril     Intense itching per patient can not tolerate    Amlodipine     dizziness   Atorvastatin     Other reaction(s): Muscle Pain   Bactrim [Sulfamethoxazole-Trimethoprim] Itching   Ciprofloxacin     dizziness   Colestid  [Colestipol Hcl]     Itching, insomnia   Colestipol Other (See Comments)    insomnia   Crestor  [Rosuvastatin Calcium]     Other reaction(s): Muscle Pain   Doxycycline    Fluvastatin Sodium     Other reaction(s): Muscle Pain   Furosemide     Severe Cramping   Labetalol Other (See Comments)    Dizziness   Losartan     itching   Lotensin [Benazepril]     unknown   Pravastatin Sodium     Numbness in legs   Prednisone     dizziness   Rosuvastatin     Muscle pain   Verapamil     Shortness of breath, swelling, palpations.    Allopurinol Other (See Comments)    dizziness   Penicillins Rash    Took for pneumonia when she was 23 and mader her mouth break out    Family History  Problem Relation Age of Onset   Stroke Mother        possible stroke   Heart Problems Father    Kidney cancer Sister    Bladder Cancer Brother      Prior to Admission medications   Medication Sig Start Date End Date Taking? Authorizing Provider  Ascorbic Acid (VITAMIN C PO) Take by mouth daily.    [provider]  aspirin 81 MG tablet Take 81 mg by mouth daily. Patient not taking: Reported on 07/06/2023    [provider]  cloNIDine (CATAPRES) 0.1 MG tablet Take by mouth. 06/16/22 07/06/23  [provider]  cyanocobalamin (VITAMIN B12) 1000 MCG tablet Take by mouth. Patient not taking: Reported on 07/06/2023    [provider]  diltiazem (CARDIZEM) 120 MG tablet Take 120 mg by mouth 4 (four) times daily. Patient not taking: Reported on 07/06/2023    [provider]  docusate sodium (COLACE) 100 MG capsule Take 100 mg by mouth 2 (two) times daily. Patient not taking: Reported  on 07/06/2023    [provider]  esomeprazole (NEXIUM) 20 MG capsule Take 1 capsule (20 mg total) by mouth daily at 12 noon. Patient taking differently: Take 40 mg by mouth daily at 12 noon. 06/22/23 06/21/24  Celso Amy, PA-C  ezetimibe (ZETIA) 10 MG tablet Take 1 tablet (10 mg total) by mouth daily. 04/21/21   Malva Limes, MD  hydrALAZINE (APRESOLINE) 25 MG tablet Take 25 mg by mouth 3 (three) times daily. Patient not taking: Reported on 07/06/2023    [provider]  HYDROcodone-acetaminophen (NORCO) 5-325 MG tablet Take  1 tablet by mouth every 4 (four) hours as needed for moderate pain. Patient not taking: Reported on 07/06/2023 06/01/23   Merwyn Katos, MD  lactulose (CHRONULAC) 10 GM/15ML solution Take by mouth 3 (three) times daily. Patient not taking: Reported on 07/06/2023    [provider]  levothyroxine (SYNTHROID) 75 MCG tablet TAKE 1 TABLET BY MOUTH EVERY DAY 01/26/21   Malva Limes, MD  linaclotide [facility]) 72 MCG capsule Take 1 capsule (72 mcg total) by mouth daily before breakfast. 06/22/23 06/21/24  Celso Amy, PA-C  meloxicam (MOBIC) 15 MG tablet Take 1 tablet (15 mg total) by mouth daily. Patient not taking: Reported on 07/06/2023 06/01/23 05/31/24  Merwyn Katos, MD  Multiple Vitamin (MULTIVITAMIN) tablet Take 1 tablet by mouth daily.    [provider]  rivaroxaban (XARELTO) 20 MG TABS tablet Take 1 tablet (20 mg total) by mouth daily with supper. 06/01/23   Georgiana Spinner, NP  sucralfate (CARAFATE) 1 g tablet Take 1 tablet (1 g total) by mouth 3 (three) times daily before meals. Patient not taking: Reported on 07/06/2023 05/19/23 08/17/23  Celso Amy, PA-C    Physical Exam: Vitals:   07/09/23 1124 07/09/23 1521  BP: (!) 156/68 (!) 152/52  Pulse: 95 76  Resp: 18 18  Temp: 98 F (36.7 C)   SpO2: 100% 100%  Weight: 101 kg   Height: 5' (1.524 m)     Constitutional: NAD, calm, comfortable Vitals:   07/09/23 1124  07/09/23 1521  BP: (!) 156/68 (!) 152/52  Pulse: 95 76  Resp: 18 18  Temp: 98 F (36.7 C)   SpO2: 100% 100%  Weight: 101 kg   Height: 5' (1.524 m)    Eyes: PERRL, lids and conjunctivae normal ENMT: Mucous membranes are moist. Posterior pharynx clear of any exudate or lesions.Normal dentition.  Neck: normal, supple, no masses, no thyromegaly Respiratory: clear to auscultation bilaterally, no wheezing, no crackles. Normal respiratory effort. No accessory muscle use.  Cardiovascular: Regular rate and rhythm, no murmurs / rubs / gallops. No extremity edema. 2+ pedal pulses. No carotid bruits.  Abdomen: no tenderness, no masses palpated. No hepatosplenomegaly. Bowel sounds positive.  Musculoskeletal: no clubbing / cyanosis. No joint deformity upper and lower extremities. Good ROM, no contractures. Normal muscle tone.  Skin: no rashes, lesions, ulcers. No induration Neurologic: CN 2-12 grossly intact. Sensation intact, DTR normal. Strength 5/5 in all 4.  Psychiatric: Normal judgment and insight. Alert and oriented x 3. Normal mood.     Labs on Admission: I have personally reviewed following labs and imaging studies  CBC: Recent Labs  Lab 07/09/23 1127  WBC 12.3*  NEUTROABS 10.2*  HGB 9.6*  HCT 31.8*  MCV 95.8  PLT 384   Basic Metabolic Panel: Recent Labs  Lab 07/09/23 1127  NA 135  K 3.5  CL 104  CO2 20*  GLUCOSE 141*  BUN 18  CREATININE 1.11*  CALCIUM 8.6*   GFR: Estimated Creatinine Clearance: 36.7 mL/min (A) (by C-G formula based on SCr of 1.11 mg/dL (H)). Liver Function Tests: Recent Labs  Lab 07/09/23 1127  AST 19  ALT 10  ALKPHOS 86  BILITOT 0.8  PROT 6.5  ALBUMIN 3.1*   No results for input(s): "LIPASE", "AMYLASE" in the last 168 hours. No results for input(s): "AMMONIA" in the last 168 hours. Coagulation Profile: No results for input(s): "INR", "PROTIME" in the last 168 hours. Cardiac Enzymes: No results for input(s): "CKTOTAL", "CKMB",  "CKMBINDEX", "TROPONINI" in  the last 168 hours. BNP (last 3 results) No results for input(s): "PROBNP" in the last 8760 hours. HbA1C: No results for input(s): "HGBA1C" in the last 72 hours. CBG: No results for input(s): "GLUCAP" in the last 168 hours. Lipid Profile: No results for input(s): "CHOL", "HDL", "LDLCALC", "TRIG", "CHOLHDL", "LDLDIRECT" in the last 72 hours. Thyroid Function Tests: No results for input(s): "TSH", "T4TOTAL", "FREET4", "T3FREE", "THYROIDAB" in the last 72 hours. Anemia Panel: No results for input(s): "VITAMINB12", "FOLATE", "FERRITIN", "TIBC", "IRON", "RETICCTPCT" in the last 72 hours. Urine analysis:    Component Value Date/Time   COLORURINE STRAW (A) 03/03/2023 0215   APPEARANCEUR CLEAR (A) 03/03/2023 0215   APPEARANCEUR Cloudy 08/05/2013 1243   LABSPEC 1.003 (L) 03/03/2023 0215   LABSPEC 1.019 08/05/2013 1243   PHURINE 6.0 03/03/2023 0215   GLUCOSEU NEGATIVE 03/03/2023 0215   GLUCOSEU Negative 08/05/2013 1243   HGBUR NEGATIVE 03/03/2023 0215   BILIRUBINUR NEGATIVE 03/03/2023 0215   BILIRUBINUR negative 11/26/2020 1507   BILIRUBINUR Negative 08/05/2013 1243   KETONESUR NEGATIVE 03/03/2023 0215   PROTEINUR NEGATIVE 03/03/2023 0215   UROBILINOGEN 0.2 11/26/2020 1507   NITRITE NEGATIVE 03/03/2023 0215   LEUKOCYTESUR NEGATIVE 03/03/2023 0215   LEUKOCYTESUR 3+ 08/05/2013 1243    Radiological Exams on Admission: No results found.  EKG: Independently reviewed.  Sinus rhythm, no acute ST changes.  Assessment/Plan Principal Problem:   Impaired ambulation  (please populate well all problems here in Problem List. (For example, if patient is on BP meds at home and you resume or decide to hold them, it is a problem that needs to be her. Same for CAD, COPD, HLD and so on)  Acute ambulation impairment -Secondary to acute right fifth metatarsal fracture and subacute left ankle sprain -Contact podiatry Dr. Lilian Kapur reviewed patient case and recommend  nonsurgical treatment and podiatry will order cam boot and follow-up with podiatry in 4 weeks.  Start PT evaluation and weightbearing as tolerated on right leg as per podiatry. -Alternate Tylenol and Dilaudid for pain control  Acute on chronic HFrEF decompensation -Mild congestion, 1 dose of Bumex, reevaluate volume status and chest x-ray tomorrow -Other DDx, patient has no fever no cough, low suspicion for pneumonia, will not redose antibiotics.  Will check procalcitonin x 1  History of DVT -No acute concern, continue Xarelto.  HTN -As needed hydralazine for now  Subacute iron deficient anemia -Likely secondary to recurrent peptic ulcer bleeding, hemoglobin stable 9.6 today compared to her baseline 8.8 of last month.  Plan to continue PPI, patient did not tolerate Carafate.  Black tarry stool likely secondary to iron pill, as well as her H&H stable, likely she can keep her scheduled EGD in 3 weeks.  DVT prophylaxis: Xarelto Code Status: Full code Family Communication: Husband at bedside Disposition Plan: Expect less than 2 midnight hospital stay Consults called: Podiatry Dr. Lilian Kapur Admission status: MedSurg observation   Emeline General MD [facility] Pager [phone]  07/09/2023, 3:43 PM

## 2023-07-09 NOTE — ED Provider Notes (Signed)
[facility] Provider Note    Event Date/Time   First MD Initiated Contact with Patient 07/09/23 1204     (approximate)   History   Fall   HPI  Kayla Chan is a 89 y.o. female with a history of CHF, CKD, hypertension, lymphedema who presents with complaints of weakness and fall.  Patient reports she fell 2 days ago, sprained her left ankle.  Apparently she fell again today because of weakness and injured her right foot.  She is unable to ambulate, typically she uses a walker to ambulate.  Denies fevers or chills, does report occasional cough.  No dysuria reported     Physical Exam   Triage Vital Signs: ED Triage Vitals [07/09/23 1124]  Encounter Vitals Group     BP (!) 156/68     Systolic BP Percentile      Diastolic BP Percentile      Pulse Rate 95     Resp 18     Temp 98 F (36.7 C)     Temp src      SpO2 100 %     Weight 101 kg (222 lb 10.6 oz)     Height 1.524 m (5')     Head Circumference      Peak Flow      Pain Score 6     Pain Loc      Pain Education      Exclude from Growth Chart     Most recent vital signs: Vitals:   07/09/23 1124  BP: (!) 156/68  Pulse: 95  Resp: 18  Temp: 98 F (36.7 C)  SpO2: 100%     General: Awake, no distress.  CV:  Good peripheral perfusion.  Resp:  Normal effort.  Abd:  No distention.  Other:  Right foot: Significant bruising along distal fourth metatarsal area,   ankle, knee with normal exam, no pain with axial load, normal range of motion   ED Results / Procedures / Treatments   Labs (all labs ordered are listed, but only abnormal results are displayed) Labs Reviewed  CBC WITH DIFFERENTIAL/PLATELET - Abnormal; Notable for the following components:      Result Value   WBC 12.3 (*)    RBC 3.32 (*)    Hemoglobin 9.6 (*)    HCT 31.8 (*)    RDW 15.9 (*)    Neutro Abs 10.2 (*)    Abs Immature Granulocytes 0.08 (*)    All other components within normal limits  COMPREHENSIVE  METABOLIC PANEL - Abnormal; Notable for the following components:   CO2 20 (*)    Glucose, Bld 141 (*)    Creatinine, Ser 1.11 (*)    Calcium 8.6 (*)    Albumin 3.1 (*)    GFR, Estimated 48 (*)    All other components within normal limits  CULTURE, BLOOD (ROUTINE X 2)  CULTURE, BLOOD (ROUTINE X 2)  URINALYSIS, ROUTINE W REFLEX MICROSCOPIC     EKG     RADIOLOGY Chest x-ray with scattered possible infiltrates, question pneumonia    PROCEDURES:  Critical Care performed:   Procedures   MEDICATIONS ORDERED IN ED: Medications  cefTRIAXone (ROCEPHIN) 1 g in sodium chloride 0.9 % 100 mL IVPB (1 g Intravenous New Bag/Given 07/09/23 1419)  azithromycin (ZITHROMAX) 500 mg in dextrose 5 % 250 mL IVPB (has no administration in time range)     IMPRESSION / MDM / ASSESSMENT AND PLAN / ED COURSE  I reviewed  the triage vital signs and the nursing notes. Patient's presentation is most consistent with acute presentation with potential threat to life or bodily function.  Patient presents with multiple falls over the last several days.  Acutely she appears to have injury to the right distal forefoot, suspect metatarsal fracture versus bruise/contusion/sprain  Unclear cause of her weakness  She does have an elevated white blood cell count and x-ray suspicious for possible pneumonia, will treat with IV Rocephin, IV azithromycin  Will consult podiatry for metatarsal fracture seen on x-ray  Have consulted the hospitalist for admission        FINAL CLINICAL IMPRESSION(S) / ED DIAGNOSES   Final diagnoses:  Closed fracture of fourth metatarsal bone of right foot, physeal involvement unspecified, initial encounter  Pneumonia of both lungs due to infectious organism, unspecified part of lung     Rx / DC Orders   ED Discharge Orders     None        Note:  This document was prepared using Dragon voice recognition software and may include unintentional dictation errors.    Jene Every, MD 07/09/23 1440

## 2023-07-09 NOTE — ED Notes (Signed)
Pt states she's had black stool for the last three weeks.

## 2023-07-10 ENCOUNTER — Observation Stay: Payer: PPO

## 2023-07-10 DIAGNOSIS — I82412 Acute embolism and thrombosis of left femoral vein: Secondary | ICD-10-CM

## 2023-07-10 DIAGNOSIS — Z86718 Personal history of other venous thrombosis and embolism: Secondary | ICD-10-CM

## 2023-07-10 DIAGNOSIS — S92334A Nondisplaced fracture of third metatarsal bone, right foot, initial encounter for closed fracture: Secondary | ICD-10-CM

## 2023-07-10 DIAGNOSIS — S92341A Displaced fracture of fourth metatarsal bone, right foot, initial encounter for closed fracture: Secondary | ICD-10-CM | POA: Diagnosis not present

## 2023-07-10 DIAGNOSIS — I10 Essential (primary) hypertension: Secondary | ICD-10-CM | POA: Diagnosis not present

## 2023-07-10 DIAGNOSIS — R262 Difficulty in walking, not elsewhere classified: Secondary | ICD-10-CM | POA: Diagnosis not present

## 2023-07-10 DIAGNOSIS — S92351A Displaced fracture of fifth metatarsal bone, right foot, initial encounter for closed fracture: Secondary | ICD-10-CM | POA: Diagnosis not present

## 2023-07-10 DIAGNOSIS — I517 Cardiomegaly: Secondary | ICD-10-CM | POA: Diagnosis not present

## 2023-07-10 DIAGNOSIS — K5900 Constipation, unspecified: Secondary | ICD-10-CM

## 2023-07-10 DIAGNOSIS — R0989 Other specified symptoms and signs involving the circulatory and respiratory systems: Secondary | ICD-10-CM | POA: Diagnosis not present

## 2023-07-10 DIAGNOSIS — I700 Atherosclerosis of aorta: Secondary | ICD-10-CM | POA: Diagnosis not present

## 2023-07-10 DIAGNOSIS — D72829 Elevated white blood cell count, unspecified: Secondary | ICD-10-CM

## 2023-07-10 DIAGNOSIS — E039 Hypothyroidism, unspecified: Secondary | ICD-10-CM

## 2023-07-10 DIAGNOSIS — N1831 Chronic kidney disease, stage 3a: Secondary | ICD-10-CM | POA: Diagnosis not present

## 2023-07-10 MED ORDER — HYDRALAZINE HCL 25 MG PO TABS
25.0000 mg | ORAL_TABLET | Freq: Three times a day (TID) | ORAL | Status: DC
  Administered 2023-07-10 – 2023-07-13 (×9): 25 mg via ORAL
  Filled 2023-07-10 (×9): qty 1

## 2023-07-10 MED ORDER — LACTULOSE 10 GM/15ML PO SOLN
30.0000 g | Freq: Two times a day (BID) | ORAL | Status: DC | PRN
  Administered 2023-07-16: 30 g via ORAL
  Filled 2023-07-10: qty 60

## 2023-07-10 MED ORDER — LEVOFLOXACIN 250 MG PO TABS
250.0000 mg | ORAL_TABLET | Freq: Every day | ORAL | Status: AC
  Administered 2023-07-10 – 2023-07-13 (×4): 250 mg via ORAL
  Filled 2023-07-10 (×4): qty 1

## 2023-07-10 NOTE — Evaluation (Addendum)
Occupational Therapy Evaluation Patient Details Name: Kayla Chan MRN: [id number] DOB: 1932/04/14 Today's Date: 07/10/2023   History of Present Illness Kayla Chan is a 89 y.o. female with medical history significant of chronic ambulation dysfunction secondary to bilateral severe knee OA on roller walker at baseline, chronic HFrEF with LVEF 40-50%, CKD stage III, DVT on Xarelto, HTN, HLD, hypothyroidism, peptic ulcer on PPI, came from home for fall and right foot pain. She was found to have acute right fifth metatarsal fracture and subacute left ankle sprain, with recommendation for WBAT on R LE with CAM boot. She has a pre-existing CAM boot for LLE.   Clinical Impression   Patient seen for evaluation. Pt/spouse were agreeable to OT/PT co-treatment to maximize safety and participation.  At baseline, pt was IND for ADLs/IADLs and Mod I for functional mobility using a RW. She does not drive d/t cataracts. Pt presents to OT with new orthopedic restrictions, BLE pain, decreased endurance, decreased strength, and impaired balance limiting independence in ADLs and functional mobility. During evaluation, she required Max-Total A x2 for bed mobility, Mod A x1 for rolling L<>R, Mod A x2 + RW for STS from elevated EOB, and Total A for LB dressing. Activity tolerance was limited by BLE pain. Patient received/left in bed with all needs in reach, spouse present. Recommend skilled acute OT services to address deficits noted below. Pt would benefit from ongoing therapy upon discharge to maximize safety and independence with ADLs, functional mobility, decrease fall risk, and decrease caregiver burden.         If plan is discharge home, recommend the following: Two people to help with walking and/or transfers; Assistance with cooking/housework; Assist for transportation; Help with stairs or ramp for entrance; Two people to help with bathing/dressing/bathroom; Direct supervision/assist for medications management;  Direct supervision/assist for financial management     Functional Status Assessment  Patient has had a recent decline in their functional status and demonstrates the ability to make significant improvements in function in a reasonable and predictable amount of time.  Equipment Recommendations   (defer to post acute)    Recommendations for Other Services       Precautions / Restrictions Precautions Precautions: Fall Required Braces or Orthoses: Other Brace Other Brace: Cam boot on both LE Restrictions Weight Bearing Restrictions: Yes RLE Weight Bearing: Weight bearing as tolerated Other Position/Activity Restrictions: with CAM BOOT      Mobility Bed Mobility Overal bed mobility: Needs Assistance Bed Mobility: Rolling, Supine to Sit, Sit to Supine Rolling: Mod assist, Used rails   Supine to sit: Max assist, +2 for safety/equipment Sit to supine: Total assist, +2 for physical assistance   General bed mobility comments: Total A x2 for scooting up in bed (use of bed features, in trendelenburg position)    Transfers Overall transfer level: Needs assistance Equipment used: Rolling walker (2 wheels) Transfers: Sit to/from Stand Sit to Stand: +2 physical assistance, Mod assist, From elevated surface          Balance Overall balance assessment: Needs assistance Sitting-balance support: Feet supported, Bilateral upper extremity supported Sitting balance-Leahy Scale: Fair Sitting balance - Comments: CGA-Min A initally, improved to close supervision Postural control: Posterior lean, Right lateral lean Standing balance support: Bilateral upper extremity supported, During functional activity, Reliant on assistive device for balance Standing balance-Leahy Scale: Poor Standing balance comment: Pt stood EOB for ~20 seconds with BUE support on RW and Min A x2, slight lean on EOB  ADL either performed or assessed with clinical judgement   ADL Overall ADL's : Needs  assistance/impaired Eating/Feeding: Set up;Bed level                   Lower Body Dressing: Total assistance;Bed level Lower Body Dressing Details (indicate cue type and reason): to don R cam boot Toilet Transfer: Moderate assistance;+2 for physical assistance;Rolling walker (2 wheels) Toilet Transfer Details (indicate cue type and reason): simulated with STS from elevated EOB Toileting- Clothing Manipulation and Hygiene: Total assistance;Bed level Toileting - Clothing Manipulation Details (indicate cue type and reason): Pt received on bed pan and required Total A for peri care             Vision Baseline Vision/History: 4 Cataracts Patient Visual Report: No change from baseline Additional comments: Has glasses, but does not wear them.       Perception         Praxis         Pertinent Vitals/Pain Pain Assessment Pain Assessment: Faces Faces Pain Scale: Hurts worst Pain Location: left knee when it is flexing, right ankle/foot with mobiliy and while donning CAM boot Pain Descriptors / Indicators: Crying, Discomfort, Grimacing, Moaning, Guarding Pain Intervention(s): Limited activity within patient's tolerance, Monitored during session, Repositioned     Extremity/Trunk Assessment Upper Extremity Assessment Upper Extremity Assessment: Generalized weakness   Lower Extremity Assessment Lower Extremity Assessment: Generalized weakness;LLE deficits/detail;RLE deficits/detail RLE Deficits / Details: In cam boot RLE: Unable to fully assess due to pain;Unable to fully assess due to immobilization LLE Deficits / Details: In Cam boot LLE: Unable to fully assess due to immobilization;Unable to fully assess due to pain       Communication Communication Communication: Hearing impairment Cueing Techniques: Verbal cues;Gestural cues;Tactile cues;Visual cues   Cognition Arousal: Alert Behavior During Therapy: Anxious Overall Cognitive Status: Difficult to assess      General Comments: Patient very HOH even with hearing aides. Husband states he does not see a change in her cognition from baseline. Very anxious with mobility. Fear of falling.     General Comments  swelling noted to right foot    Exercises Other Exercises Other Exercises: OT provided education re: role of OT, OT POC, post acute recs, sitting up for all meals, EOB/OOB mobility with assistance, home/fall safety, WB status, how to don cam boot    Shoulder Instructions      Home Living Family/patient expects to be discharged to:: Private residence Living Arrangements: Spouse/significant other Available Help at Discharge: Available 24 hours/day;Family Type of Home: House Home Access: Stairs to enter [employer] of Steps: 2 Entrance Stairs-Rails: Right;Left;Can reach both Home Layout: Two level;Able to live on main level with bedroom/bathroom     Bathroom Shower/Tub: Producer, television/film/video:  (slightly elevated)     Home Equipment: Pharmacist, hospital (2 wheels);Lift chair;Wheelchair - manual          Prior Functioning/Environment Prior Level of Function : Independent/Modified Independent; History of Falls (last six months)              Mobility Comments: Patient ambulates mod I with RW at baseline and sleeps in her bed or a lift recliner. She has had recent 2 falls, but none prior to that in the last 6 months.  ADLs Comments: IND with ADLs/IADLs. Does not drive d/t cataracts.         OT Problem List: Decreased strength;Decreased range of motion;Decreased activity tolerance;Impaired balance (sitting and/or standing);Impaired vision/perception;Decreased  knowledge of use of DME or AE;Decreased knowledge of precautions;Pain;Obesity;Increased edema      OT Treatment/Interventions: Self-care/ADL training;Therapeutic exercise;Neuromuscular education;Energy conservation;DME and/or AE instruction;Therapeutic activities;Patient/family education;Balance  training    OT Goals(Current goals can be found in the care plan section) Acute Rehab OT Goals Patient Stated Goal: get stronger OT Goal Formulation: With patient/family Time For Goal Achievement: 07/24/23 Potential to Achieve Goals: Fair  OT Frequency: Min 1X/week    Co-evaluation PT/OT/SLP Co-Evaluation/Treatment: Yes Reason for Co-Treatment: Complexity of the patient's impairments (multi-system involvement);Necessary to address cognition/behavior during functional activity;For patient/therapist safety;To address functional/ADL transfers PT goals addressed during session: Mobility/safety with mobility;Strengthening/ROM;Balance;Proper use of DME OT goals addressed during session: ADL's and self-care;Proper use of Adaptive equipment and DME      AM-PAC OT "6 Clicks" Daily Activity     Outcome Measure Help from another person eating meals?: A Little Help from another person taking care of personal grooming?: A Little Help from another person toileting, which includes using toliet, bedpan, or urinal?: Total Help from another person bathing (including washing, rinsing, drying)?: Total Help from another person to put on and taking off regular upper body clothing?: A Lot Help from another person to put on and taking off regular lower body clothing?: Total 6 Click Score: 11   End of Session Equipment Utilized During Treatment: Gait belt;Rolling walker (2 wheels);Other (comment) (B cam boot) Nurse Communication: Mobility status;Precautions;Weight bearing status  Activity Tolerance: Patient limited by pain Patient left: in bed;with call bell/phone within reach;with bed alarm set;with family/visitor present  OT Visit Diagnosis: Other abnormalities of gait and mobility (R26.89);Repeated falls (R29.6);Muscle weakness (generalized) (M62.81);Pain Pain - Right/Left: Right Pain - part of body: Ankle and joints of foot                Time: 6295-2841 OT Time Calculation (min): 40 min Charges:   OT General Charges $OT Visit: 1 Visit OT Evaluation $OT Eval Moderate Complexity: 1 Mod  [facility] MS, OTR/L ascom [phone]  07/10/23, 1:31 PM

## 2023-07-10 NOTE — Assessment & Plan Note (Signed)
History of DVT s/p thrombectomy. -No acute concern -Continue with home Xarelto

## 2023-07-10 NOTE — Consult Note (Signed)
Reason for Consult: Metatarsal fractures right foot Referring Physician: Dr. Bobbie Stack is an 89 y.o. female.  HPI: Patient is 89 year old female.  She suffered a fall at home yesterday twisting her right foot she has been ambulating in a cam boot on the left foot for an ankle sprain.  She has a complicated medical history including CAD DVT stage III CKD gout and hypertension.  Her husband helps provide much of the history, the patient is using a bedpan during my exam  Past Medical History:  Diagnosis Date   Cancer (HCC)    BCC on nose   Carotid artery occlusion    CHF (congestive heart failure) (HCC)    Chronic kidney disease    stage III   DVT, femoral, acute (HCC) 05/20/2023   Thrombectomy with stent placement   Gout    Hyperlipidemia    Hypertension    Hypothyroidism    Lymphedema    Wears hearing aid in both ears     Past Surgical History:  Procedure Laterality Date   24 hour Holter Monitor  01/22/2013   4550 ectopic beats with 344 superventricular bigemminy events   ABDOMINAL HYSTERECTOMY     Abdominal Ultrasound  05/31/2013   RUQ. Fatty Liver   APPENDECTOMY  08/24/1948   [place]   CATARACT EXTRACTION W/PHACO Left 07/31/2022   Procedure: CATARACT EXTRACTION PHACO AND INTRAOCULAR LENS PLACEMENT (IOC) LEFT;  Surgeon: Galen Manila, MD;  Location: [facility];  Service: Ophthalmology;  Laterality: Left;  11.46 1:14.3   HARDWARE REMOVAL Right 04/21/2012   knee   HERNIA REPAIR     PERIPHERAL VASCULAR THROMBECTOMY Left 05/20/2023   Procedure: PERIPHERAL VASCULAR THROMBECTOMY;  Surgeon: Annice Needy, MD;  Location: [facility] INVASIVE CV LAB;  Service: Cardiovascular;  Laterality: Left;   REPLACEMENT TOTAL KNEE Right 09/10/2011   Inpatient, [employer] [facility], [place]; Right   TOTAL HIP ARTHROPLASTY Left 10/12/2008   TOTAL KNEE ARTHROPLASTY Left 05/20/2006    Family History  Problem Relation Age of Onset   Stroke Mother        possible stroke    Heart Problems Father    Kidney cancer Sister    Bladder Cancer Brother     Social History:  reports that she has never smoked. She has never used smokeless tobacco. She reports that she does not currently use alcohol. She reports that she does not use drugs.  Allergies:  Allergies  Allergen Reactions   Lisinopril     Intense itching per patient can not tolerate    Amlodipine     dizziness   Atorvastatin     Other reaction(s): Muscle Pain   Bactrim [Sulfamethoxazole-Trimethoprim] Itching   Ciprofloxacin     dizziness   Colestid  [Colestipol Hcl]     Itching, insomnia   Colestipol Other (See Comments)    insomnia   Crestor  [Rosuvastatin Calcium]     Other reaction(s): Muscle Pain   Doxycycline    Fluvastatin Sodium     Other reaction(s): Muscle Pain   Furosemide     Severe Cramping   Labetalol Other (See Comments)    Dizziness   Losartan     itching   Lotensin [Benazepril]     unknown   Pravastatin Sodium     Numbness in legs   Prednisone     dizziness   Rosuvastatin     Muscle pain   Verapamil     Shortness of breath, swelling, palpations.    Allopurinol  Other (See Comments)    dizziness   Penicillins Rash    Took for pneumonia when she was 23 and mader her mouth break out    Medications: I have reviewed the patient's current medications.  Results for orders placed or performed during the hospital encounter of 07/09/23 (from the past 48 hour(s))  CBC with Differential     Status: Abnormal   Collection Time: 07/09/23 11:27 AM  Result Value Ref Range   WBC 12.3 (H) 4.0 - 10.5 K/uL   RBC 3.32 (L) 3.87 - 5.11 MIL/uL   Hemoglobin 9.6 (L) 12.0 - 15.0 g/dL   HCT 25.9 (L) 56.3 - 87.5 %   MCV 95.8 80.0 - 100.0 fL   MCH 28.9 26.0 - 34.0 pg   MCHC 30.2 30.0 - 36.0 g/dL   RDW 64.3 (H) 32.9 - 51.8 %   Platelets 384 150 - 400 K/uL   nRBC 0.0 0.0 - 0.2 %   Neutrophils Relative % 84 %   Neutro Abs 10.2 (H) 1.7 - 7.7 K/uL   Lymphocytes Relative 7 %   Lymphs Abs  0.9 0.7 - 4.0 K/uL   Monocytes Relative 8 %   Monocytes Absolute 1.0 0.1 - 1.0 K/uL   Eosinophils Relative 0 %   Eosinophils Absolute 0.0 0.0 - 0.5 K/uL   Basophils Relative 0 %   Basophils Absolute 0.0 0.0 - 0.1 K/uL   Immature Granulocytes 1 %   Abs Immature Granulocytes 0.08 (H) 0.00 - 0.07 K/uL    Comment: Performed at [facility], [address] [place], [place] [address]  Comprehensive metabolic panel     Status: Abnormal   Collection Time: 07/09/23 11:27 AM  Result Value Ref Range   Sodium 135 135 - 145 mmol/L   Potassium 3.5 3.5 - 5.1 mmol/L   Chloride 104 98 - 111 mmol/L   CO2 20 (L) 22 - 32 mmol/L   Glucose, Bld 141 (H) 70 - 99 mg/dL    Comment: Glucose reference range applies only to samples taken after fasting for at least 8 hours.   BUN 18 8 - 23 mg/dL   Creatinine, Ser 0.63 (H) 0.44 - 1.00 mg/dL   Calcium 8.6 (L) 8.9 - 10.3 mg/dL   Total Protein 6.5 6.5 - 8.1 g/dL   Albumin 3.1 (L) 3.5 - 5.0 g/dL   AST 19 15 - 41 U/L   ALT 10 0 - 44 U/L   Alkaline Phosphatase 86 38 - 126 U/L   Total Bilirubin 0.8 <1.2 mg/dL   GFR, Estimated 48 (L) >60 mL/min    Comment: (NOTE) Calculated using the CKD-EPI Creatinine Equation (2021)    Anion gap 11 5 - 15    Comment: Performed at [facility], [address] [place], [place] [address]  Procalcitonin     Status: None   Collection Time: 07/09/23 11:27 AM  Result Value Ref Range   Procalcitonin 0.21 ng/mL    Comment:        Interpretation: PCT (Procalcitonin) <= 0.5 ng/mL: Systemic infection (sepsis) is not likely. Local bacterial infection is possible. (NOTE)       Sepsis PCT Algorithm           Lower Respiratory Tract                                      Infection PCT Algorithm    ----------------------------     ----------------------------  PCT < 0.25 ng/mL                PCT < 0.10 ng/mL          Strongly encourage             Strongly discourage   discontinuation of antibiotics     initiation of antibiotics    ----------------------------     -----------------------------       PCT 0.25 - 0.50 ng/mL            PCT 0.10 - 0.25 ng/mL               OR       >80% decrease in PCT            Discourage initiation of                                            antibiotics      Encourage discontinuation           of antibiotics    ----------------------------     -----------------------------         PCT >= 0.50 ng/mL              PCT 0.26 - 0.50 ng/mL               AND        <80% decrease in PCT             Encourage initiation of                                             antibiotics       Encourage continuation           of antibiotics    ----------------------------     -----------------------------        PCT >= 0.50 ng/mL                  PCT > 0.50 ng/mL               AND         increase in PCT                  Strongly encourage                                      initiation of antibiotics    Strongly encourage escalation           of antibiotics                                     -----------------------------                                           PCT <= 0.25 ng/mL  OR                                        > 80% decrease in PCT                                      Discontinue / Do not initiate                                             antibiotics  Performed at [facility], [address] [place], [place] [address]   Blood culture (routine x 2)     Status: None (Preliminary result)   Collection Time: 07/09/23  2:06 PM   Specimen: BLOOD  Result Value Ref Range   Specimen Description BLOOD LEFT ANTECUBITAL    Special Requests      BOTTLES DRAWN AEROBIC AND ANAEROBIC Blood Culture adequate volume   Culture      NO GROWTH < 24 HOURS Performed at [facility], [address] [place], [place] [address]    Report Status PENDING   Blood culture (routine x 2)     Status: None  (Preliminary result)   Collection Time: 07/09/23  2:06 PM   Specimen: BLOOD  Result Value Ref Range   Specimen Description BLOOD BLOOD LEFT ARM    Special Requests      BOTTLES DRAWN AEROBIC AND ANAEROBIC Blood Culture adequate volume   Culture      NO GROWTH < 24 HOURS Performed at [facility], [address] [place], [place] [address]    Report Status PENDING     DG Chest 1 View  Result Date: 07/10/2023 CLINICAL DATA:  89 year old female with history of congestive heart failure. EXAM: CHEST  1 VIEW COMPARISON:  Chest x-ray 07/09/2023. FINDINGS: Opacities projecting over the lower left hemithorax and left upper quadrant of the abdomen appear to be external to the patient. With this limitation in mind, lung volumes appear normal. No definite consolidative airspace disease. No pleural effusions. No pneumothorax. No evidence of pulmonary edema. Heart size is mildly enlarged. Upper mediastinal contours are within normal limits. Atherosclerotic calcifications are noted in the thoracic aorta. IMPRESSION: 1. No radiographic evidence of acute cardiopulmonary disease. 2. Mild cardiomegaly unchanged. 3. Aortic atherosclerosis. Electronically Signed   By: Trudie Reed M.D.   On: 07/10/2023 07:18   DG Chest Port 1 View  Result Date: 07/09/2023 CLINICAL DATA:  Weakness, fall today. EXAM: PORTABLE CHEST 1 VIEW COMPARISON:  Chest radiograph 01/06/2022 FINDINGS: Stable cardiomegaly. Unchanged mediastinal contours. Aortic atherosclerosis. No pulmonary edema, focal airspace disease, pleural effusion or pneumothorax. No acute osseous abnormalities are seen. IMPRESSION: Stable cardiomegaly. No acute chest findings. Electronically Signed   By: Narda Rutherford M.D.   On: 07/09/2023 16:02   DG Ankle Complete Right  Result Date: 07/09/2023 CLINICAL DATA:  Fall today with right foot pain and bruising. Ankle swelling EXAM: RIGHT ANKLE - COMPLETE 3+ VIEW; RIGHT FOOT COMPLETE - 3+ VIEW COMPARISON:   None Available. FINDINGS: Foot: Comminuted and displaced fifth distal metatarsal shaft fracture. Mildly displaced and angulated fourth metatarsal neck fracture. Suspected nondisplaced fractures of the distal third metatarsal neck. No convincing intra-articular involvement of any  of the fractures. The bones are subjectively under mineralized. Hallux valgus with advanced degenerative change of the first metatarsal phalangeal joint. Os navicular. Prominent soft tissue edema overlies the dorsum of the foot in the region of fractures. Ankle: No additional fracture of the ankle. The ankle mortise is preserved. There is a moderate plantar calcaneal spur. Prominent dorsal talar ridge. No ankle joint effusion. There is generalized subcutaneous edema. IMPRESSION: 1. Comminuted and displaced fifth distal metatarsal shaft fracture. 2. Mildly displaced and angulated fourth metatarsal neck fracture. Suspected nondisplaced fractures of the distal third metatarsal neck. 3. No additional fracture of the ankle. 4. Hallux valgus with advanced degenerative change of the first metatarsophalangeal joint. 5. Generalized soft tissue edema of the foot and ankle, prominent over the dorsum of the foot. Electronically Signed   By: Narda Rutherford M.D.   On: 07/09/2023 15:59   DG Foot Complete Right  Result Date: 07/09/2023 CLINICAL DATA:  Fall today with right foot pain and bruising. Ankle swelling EXAM: RIGHT ANKLE - COMPLETE 3+ VIEW; RIGHT FOOT COMPLETE - 3+ VIEW COMPARISON:  None Available. FINDINGS: Foot: Comminuted and displaced fifth distal metatarsal shaft fracture. Mildly displaced and angulated fourth metatarsal neck fracture. Suspected nondisplaced fractures of the distal third metatarsal neck. No convincing intra-articular involvement of any of the fractures. The bones are subjectively under mineralized. Hallux valgus with advanced degenerative change of the first metatarsal phalangeal joint. Os navicular. Prominent soft  tissue edema overlies the dorsum of the foot in the region of fractures. Ankle: No additional fracture of the ankle. The ankle mortise is preserved. There is a moderate plantar calcaneal spur. Prominent dorsal talar ridge. No ankle joint effusion. There is generalized subcutaneous edema. IMPRESSION: 1. Comminuted and displaced fifth distal metatarsal shaft fracture. 2. Mildly displaced and angulated fourth metatarsal neck fracture. Suspected nondisplaced fractures of the distal third metatarsal neck. 3. No additional fracture of the ankle. 4. Hallux valgus with advanced degenerative change of the first metatarsophalangeal joint. 5. Generalized soft tissue edema of the foot and ankle, prominent over the dorsum of the foot. Electronically Signed   By: Narda Rutherford M.D.   On: 07/09/2023 15:59    Review of Systems  Constitutional:  Negative for chills and fever.  Respiratory:  Negative for shortness of breath.   Cardiovascular:  Negative for chest pain.  Gastrointestinal:  Negative for diarrhea, nausea and vomiting.  Musculoskeletal:  Positive for falls and joint pain.  All other systems reviewed and are negative.  Blood pressure (!) 153/61, pulse 93, temperature 99.5 F (37.5 C), temperature source Oral, resp. rate 16, height 5' (1.524 m), weight 101 kg, SpO2 93%.  Vitals:   07/10/23 0826 07/10/23 1437  BP: (!) 158/59 (!) 153/61  Pulse: 78 93  Resp: 16   Temp: (!) 97.5 F (36.4 C) 99.5 F (37.5 C)  SpO2: 98% 93%    General AA&O x3. Normal mood and affect.  Vascular Dorsalis pedis and posterior tibial pulses  present 2+ right  Capillary refill normal to all digits. Pedal hair growth normal.  Neurologic Epicritic sensation grossly present.  Dermatologic (Wound) No open lacerations or lesions.  There is ecchymosis on the dorsal lateral foot  Orthopedic: Motor intact BLE.  Tenderness palpation to distal forefoot right    Assessment/Plan:  Metatarsal fractures of the third fourth and  fifth metatarsals -Imaging: Studies independently reviewed.  Her third and fourth metatarsal neck fractures are minimally displaced.  There is comminution and displacement of the fifth metatarsal fracture, however  no significant fracture gap.  Considering her advanced age and medical comorbidities I am doubtful that surgical intervention will offer an advanced recovery and fifth metatarsal distal diaphyseal and metaphyseal fractures typically heal quite well with nonoperative treatment in healthy patients. -Weightbearing as tolerated in cam boot on right -Her husband states she has follow-up with [facility] in 2 weeks to reevaluate her left ankle and foot.  Hopefully she can transition out of the boot on the left side then to weight-bear in the boot on the right side only -Agree with SNF for now -Vitamin D level ordered for a.m. -No surgical plans -Has CAM boot for the right foot now.  She may be weightbearing in this and follow-up with me in 1 month in the office for new radiographs.  -We will sign off and not follow while she is inpatient.  Please call or secure chat the on-call podiatrist for a practice if you have any questions or concerns regarding her care or disposition  Edwin Cap 07/10/2023, 6:25 PM   Best available via secure chat for questions or concerns.

## 2023-07-10 NOTE — Progress Notes (Signed)
PT Cancellation Note  Patient Details Name: Kayla Chan MRN: [id number] DOB: 29-Nov-1931   Cancelled Treatment:    Reason Eval/Treat Not Completed: Other (comment) (No Cam boot for R LE had been ordered. Informed nursing and gave basic guidance on how to order. Will return to complete PT evaluation once patient has required equipment.)   Luretha Murphy. Ilsa Iha, PT, DPT 07/10/23, 9:41 AM  [facility] [address] [place], [place] [address] P: [phone] I F: [phone]

## 2023-07-10 NOTE — Assessment & Plan Note (Signed)
Creatinine seems to be within baseline. -Monitor renal function -Avoid nephrotoxin

## 2023-07-10 NOTE — Hospital Course (Addendum)
Taken from H&P.  Kayla Chan is a 89 y.o. female with medical history significant of chronic ambulation dysfunction secondary to bilateral severe knee OA on roller walker at baseline, chronic HFrEF with LVEF 40-50%, CKD stage III, DVT on Xarelto, HTN, HLD, hypothyroidism, peptic ulcer on PPI, came from home for fall and right foot pain.   With severe bilateral knee OA, patient uses roller walker to ambulate at baseline for the last 3 years. She has unsteady gait at baseline. And 3 weeks ago she fell and twisted left ankle. Then she went to see urgent care and x-ray showed no fracture or dislocation and patient was given a cam boot and sent home.   On presentation vitals stable, chest x-ray with mild pulmonary congestion.  Labs with hemoglobin of 9.6, leukocytosis at 12.3, creatinine 1.1 and potassium of 3.5. DG right ankle and foot with comminuted and displaced fifth distal metatarsal shaft fracture, mildly displaced and angulated fourth metatarsal neck fracture and a nondisplaced fracture of distal third metatarsal neck.  And likely ligamental strain on ankle as there was no fracture noted.  Generalized soft tissue edema of foot or neck.  Patient was given ceftriaxone and Zithromax in ED. which were not continued.  Podiatry was consulted and they were recommending cam boot and outpatient follow-up in 4 weeks.  PT and OT evaluation ordered.  Patient will remain weightbearing as tolerated  11/16: Mildly elevated blood pressure, procalcitonin at 0.21, preliminary blood cultures negative in 24-hour.  Ordered Levaquin. PT is recommending SNF, TOC was consulted.  11/17: Vital stable, vitamin D levels low at 25-ordered replacement.  B12 borderline at 400, also started on supplement.  Potassium at 3.3 which is being repleted.  Hemoglobin at 8.3, slowly decreasing, FOBT negative.  Ordered TSH with morning lab

## 2023-07-10 NOTE — Assessment & Plan Note (Signed)
Frequent fall. Right metatarsal fractures secondary to fall. Ankle sprain.  Patient with progressively worsening ambulation and history of falls.  Resulted in right mild to moderate displaced fourth and fifth metatarsal fractures with some ankle sprain.  Podiatry was consulted and they are recommending cam boot, weightbearing as tolerated and outpatient follow-up. PT is recommending SNF TOC consult for SNF placement -Continue with pain management

## 2023-07-10 NOTE — Progress Notes (Signed)
OT Cancellation Note  Patient Details Name: Kayla Chan MRN: [id number] DOB: 03-11-1932   Cancelled Treatment:    Reason Eval/Treat Not Completed: Other (comment) (OT orders received, chart reviewed. Waiting on RLE cam boot to be delivered. Discussed with NSG and supply. Will re-attempt OT evaluation once patient has required equipment.)  Gerrie Nordmann 07/10/2023, 9:31 AM

## 2023-07-10 NOTE — Progress Notes (Signed)
Progress Note   Patient: Kayla Chan ONG:[id number] DOB: 03-11-1932 DOA: 07/09/2023     0 DOS: the patient was seen and examined on 07/10/2023   Brief hospital course: Taken from H&P.  Genelda Giambra is a 89 y.o. female with medical history significant of chronic ambulation dysfunction secondary to bilateral severe knee OA on roller walker at baseline, chronic HFrEF with LVEF 40-50%, CKD stage III, DVT on Xarelto, HTN, HLD, hypothyroidism, peptic ulcer on PPI, came from home for fall and right foot pain.   With severe bilateral knee OA, patient uses roller walker to ambulate at baseline for the last 3 years. She has unsteady gait at baseline. And 3 weeks ago she fell and twisted left ankle. Then she went to see urgent care and x-ray showed no fracture or dislocation and patient was given a cam boot and sent home.   On presentation vitals stable, chest x-ray with mild pulmonary congestion.  Labs with hemoglobin of 9.6, leukocytosis at 12.3, creatinine 1.1 and potassium of 3.5. DG right ankle and foot with comminuted and displaced fifth distal metatarsal shaft fracture, mildly displaced and angulated fourth metatarsal neck fracture and a nondisplaced fracture of distal third metatarsal neck.  And likely ligamental strain on ankle as there was no fracture noted.  Generalized soft tissue edema of foot or neck.  Patient was given ceftriaxone and Zithromax in ED. which were not continued.  Podiatry was consulted and they were recommending cam boot and outpatient follow-up in 4 weeks.  PT and OT evaluation ordered.  Patient will remain weightbearing as tolerated  11/15: Mildly elevated blood pressure, procalcitonin at 0.21, preliminary blood cultures negative in 24-hour.  Ordered Levaquin. PT is recommending SNF, TOC was consulted.   Assessment and Plan: * Impaired ambulation Frequent fall. Right metatarsal fractures secondary to fall. Ankle sprain.  Patient with progressively worsening  ambulation and history of falls.  Resulted in right mild to moderate displaced fourth and fifth metatarsal fractures with some ankle sprain.  Podiatry was consulted and they are recommending cam boot, weightbearing as tolerated and outpatient follow-up. PT is recommending SNF TOC consult for SNF placement -Continue with pain management  Personal history of venous thrombosis and embolism History of DVT s/p thrombectomy. -No acute concern -Continue with home Xarelto  Leukocytosis Likely multifactorial with recent fall.  No upper respiratory symptoms, mildly positive procalcitonin at 0.21, preliminary blood cultures negative.  UA was ordered on admission but never obtained. -Continue to monitor  Essential hypertension Blood pressure elevated.  Patient was not taking home antihypertensives due to significant medication intolerance. -Restarting home hydralazine -Continue to monitor  Hypothyroidism -Continue home Synthroid  Obesity, Class III, BMI 40-49.9 (morbid obesity) (HCC) Estimated body mass index is 43.49 kg/m as calculated from the following:   Height as of this encounter: 5' (1.524 m).   Weight as of this encounter: 101 kg.   -This will complicate overall prognosis  Chronic kidney disease, stage 3a (HCC) Creatinine seems to be within baseline. -Monitor renal function -Avoid nephrotoxin  Constipation Patient with history of chronic constipation. -Continue with as needed lactulose -Current daily with linzess   Subjective: Patient was seen and examined today.  Pain seems controlled at this time.  Still feeling very weak.  Not much upper respiratory symptoms.  Physical Exam: Vitals:   07/09/23 2024 07/10/23 0331 07/10/23 0441 07/10/23 0826  BP: (!) 177/67 (!) 168/62 (!) 151/69 (!) 158/59  Pulse: 86 80 76 78  Resp: 18 20  16   Temp: 98.4  F (36.9 C) 98.4 F (36.9 C)  (!) 97.5 F (36.4 C)  TempSrc: Oral   Oral  SpO2: 96% 98% 96% 98%  Weight:      Height:        General.  Frail elderly lady, in no acute distress. Pulmonary.  Lungs clear bilaterally, normal respiratory effort. CV.  Regular rate and rhythm, no JVD, rub or murmur. Abdomen.  Soft, nontender, nondistended, BS positive. CNS.  Alert and oriented .  No focal neurologic deficit. Extremities.  No edema, no cyanosis, pulses intact and symmetrical.   Data Reviewed: Prior data reviewed  Family Communication: Cussed with husband at bedside  Disposition: Status is: Observation The patient remains OBS appropriate and will d/c before 2 midnights.  Planned Discharge Destination: Skilled nursing facility  DVT prophylaxis.  Xarelto Time spent: 45 minutes  This record has been created using Conservation officer, historic buildings. Errors have been sought and corrected,but may not always be located. Such creation errors do not reflect on the standard of care.   Author: Arnetha Courser, MD 07/10/2023 2:31 PM  For on call review [email or web address].

## 2023-07-10 NOTE — Assessment & Plan Note (Signed)
-   Continue home Synthroid °

## 2023-07-10 NOTE — Evaluation (Signed)
Physical Therapy Evaluation Patient Details Name: Emmagrace Cicchetti MRN: [id number] DOB: 09-Oct-1931 Today's Date: 07/10/2023  History of Present Illness  Warren Chrystal is a 89 y.o. female with medical history significant of chronic ambulation dysfunction secondary to bilateral severe knee OA on roller walker at baseline, chronic HFrEF with LVEF 40-50%, CKD stage III, DVT on Xarelto, HTN, HLD, hypothyroidism, peptic ulcer on PPI, came from home for fall and right foot pain. She was was found to have acute right fifth metatarsal fracture and subacute left ankle sprain, with recommendation for WBAT on R LE with CAM boot. She has a pre-existing CAM boot for L LE.   Clinical Impression  Patient received while lying on bedpan attempting to have a BM. She was extremely hard of hearing even with hearing aides, so husband provided history and assisted with communication at times. Co-evaluation/treatment with OT and NT present helping with pericare and obtaining a urine sample during session. Patient highly anxious and had intense fear of falling while performing supine to sit and sit to stand. Patient lives with her husband in a 2 story home with 2 steps to enter where she stays on the first level. Prior to her two falls last week, she had not had any falls in the last 6 months. She was I with ADLs but needed help with IADLs and had stopped driving at baseline. She ambulated with RW. Upon PT evaluation, patient was dependent for donning R CAM boot, required mod A +1 for rolling, max A+2 for supine to sit, and was dependent +2 for sit to supine and boost up bed. She required mod A +2 for sit <> stand to RW at edge of elevated bed for about 20 seconds with posterior lean. She is required to wear bilateral CAM boots due to her injuries from falls in the last week, which also decreases her balance. She is currently dependent for care and unable to safely transfer to [facility] for toileting without a hoyer lift. She has  experienced a significant decline in functional mobility and independence. Patient would benefit from skilled physical therapy to address impairments and functional limitations (see PT Problem List below) to work towards stated goals and return to PLOF or maximal functional independence.          If plan is discharge home, recommend the following: Direct supervision/assist for financial management;Two people to help with walking and/or transfers;Two people to help with bathing/dressing/bathroom;Help with stairs or ramp for entrance;Assist for transportation;Assistance with cooking/housework (patient is currently dependent for care and is safest using a bedpan)   Can travel by private vehicle   No    Equipment Recommendations Hoyer lift;Hospital bed;BSC/3in1  Recommendations for Other Services       Functional Status Assessment Patient has had a recent decline in their functional status and demonstrates the ability to make significant improvements in function in a reasonable and predictable amount of time.     Precautions / Restrictions Precautions Precautions: Fall Required Braces or Orthoses: Other Brace Other Brace: Cam boot on both LE Restrictions Weight Bearing Restrictions: Yes RLE Weight Bearing: Weight bearing as tolerated Other Position/Activity Restrictions: with CAM BOOT      Mobility  Bed Mobility Overal bed mobility: Needs Assistance Bed Mobility: Rolling, Supine to Sit, Sit to Supine Rolling: Mod assist, Used rails   Supine to sit: Max assist, +2 for safety/equipment Sit to supine: Total assist, +2 for physical assistance   General bed mobility comments: required total A +2 for boost up bed  in trendelenburg position    Transfers Overall transfer level: Needs assistance Equipment used: Rolling walker (2 wheels) Transfers: Sit to/from Stand Sit to Stand: +2 physical assistance, Mod assist, From elevated surface           General transfer comment: Patient  stood from elevated edge of bed with B UE support on RW with slight lean at back of legs on bed with mod A +2. She stood for approx 20 seconds with support.    Ambulation/Gait               General Gait Details: unwilling to attempt taking steps (fearful of falling)  Stairs            Wheelchair Mobility     Tilt Bed    Modified Rankin (Stroke Patients Only)       Balance Overall balance assessment: Needs assistance Sitting-balance support: Feet supported, Bilateral upper extremity supported Sitting balance-Leahy Scale: Fair Sitting balance - Comments: requires CGA -min A to maintain seated balance at first progressing to close supervision with B UE support on RW. Postural control: Posterior lean, Right lateral lean Standing balance support: Bilateral upper extremity supported, During functional activity, Reliant on assistive device for balance Standing balance-Leahy Scale: Poor Standing balance comment: stood at edge of bed for approx 20 seconds with B UE support on RW with min A +2 and slight lean on edge of bed.                             Pertinent Vitals/Pain Pain Assessment Pain Assessment: Faces Faces Pain Scale: Hurts whole lot Pain Location: left knee when it is flexing, right ankle/foot with mobiliy and while donning CAM boot Pain Descriptors / Indicators: Crying, Discomfort, Grimacing, Moaning, Guarding Pain Intervention(s): Limited activity within patient's tolerance, Monitored during session, Repositioned    Home Living Family/patient expects to be discharged to:: Private residence Living Arrangements: Spouse/significant other Available Help at Discharge: Available 24 hours/day;Family Type of Home: House Home Access: Stairs to enter Entrance Stairs-Rails: Right;Left;Can reach both Entrance Stairs-Number of Steps: 2   Home Layout: Two level;Able to live on main level with bedroom/bathroom Home Equipment: Shower seat;Rolling Walker (2  wheels);Lift chair;Wheelchair - manual      Prior Function Prior Level of Function : Needs assist             Mobility Comments: Patient ambulates mod I with RW at baseline and sleeps in her bed or a lift recliner. She has had 2 falls in the last week, but none prior to that in the last 6 months. ADLs Comments: I with ADLs but needed help with IADLs and stopped driving.     Extremity/Trunk Assessment   Upper Extremity Assessment Upper Extremity Assessment: Generalized weakness    Lower Extremity Assessment Lower Extremity Assessment: Generalized weakness;LLE deficits/detail;RLE deficits/detail RLE Deficits / Details: Patient able to perform heel slide to approx 70 degrees flexion while supine in bed. CAM boot on ankle. Needs assistance moving B LE on/off bed. RLE: Unable to fully assess due to pain;Unable to fully assess due to immobilization LLE Deficits / Details: Patient cries out in pain when L knee flexes. CAM boot on ankle. Needs assistance moving B LE on/off bed. LLE: Unable to fully assess due to immobilization;Unable to fully assess due to pain       Communication   Communication Communication: Hearing impairment Cueing Techniques: Verbal cues;Gestural cues;Tactile cues;Visual cues  Cognition Arousal: Alert  Behavior During Therapy: Anxious Overall Cognitive Status: Difficult to assess                                 General Comments: Patient very anxous and hard of hearing even with hearing aides. Husband present states he does not see a change in her cognition from baseline. She is very anxious, making it difficult to assess cognition. She has unreasonable fear of falling off the bed with 3-4 people around her while sitting on edge of bed.        General Comments General comments (skin integrity, edema, etc.): swelling and mild echymosis noted at right foot    Exercises Other Exercises Other Exercises: educated patient and husband on role of PT  in acute care setting, discharge reccomendations, pt dependent for donning of R CAM boot with   Assessment/Plan    PT Assessment Patient needs continued PT services  PT Problem List Decreased strength;Decreased mobility;Decreased safety awareness;Decreased range of motion;Decreased coordination;Decreased knowledge of precautions;Obesity;Decreased activity tolerance;Decreased cognition;Decreased balance;Decreased knowledge of use of DME;Pain       PT Treatment Interventions DME instruction;Therapeutic exercise;Gait training;Balance training;Stair training;Neuromuscular re-education;Functional mobility training;Therapeutic activities;Patient/family education;Cognitive remediation    PT Goals (Current goals can be found in the Care Plan section)  Acute Rehab PT Goals Patient Stated Goal: get better PT Goal Formulation: With patient/family Time For Goal Achievement: 07/24/23 Potential to Achieve Goals: Good    Frequency Min 2X/week     Co-evaluation PT/OT/SLP Co-Evaluation/Treatment: Yes Reason for Co-Treatment: Complexity of the patient's impairments (multi-system involvement);Necessary to address cognition/behavior during functional activity;For patient/therapist safety;To address functional/ADL transfers PT goals addressed during session: Mobility/safety with mobility;Strengthening/ROM;Balance;Proper use of DME         AM-PAC PT "6 Clicks" Mobility  Outcome Measure Help needed turning from your back to your side while in a flat bed without using bedrails?: A Lot Help needed moving from lying on your back to sitting on the side of a flat bed without using bedrails?: Total Help needed moving to and from a bed to a chair (including a wheelchair)?: Total Help needed standing up from a chair using your arms (e.g., wheelchair or bedside chair)?: A Lot Help needed to walk in hospital room?: Total Help needed climbing 3-5 steps with a railing? : Total 6 Click Score: 8    End of  Session Equipment Utilized During Treatment: Gait belt;Other (comment) (bilateral CAM boots) Activity Tolerance: Patient limited by pain (limited by anxiety) Patient left: in bed;with call bell/phone within reach;with bed alarm set;with nursing/sitter in room;with family/visitor present Nurse Communication: Mobility status PT Visit Diagnosis: Muscle weakness (generalized) (M62.81);History of falling (Z91.81);Other abnormalities of gait and mobility (R26.89)    Time: 8119-1478 PT Time Calculation (min) (ACUTE ONLY): 36 min   Charges:   PT Evaluation $PT Eval Moderate Complexity: 1 Mod   PT General Charges $$ ACUTE PT VISIT: 1 Visit         Huntley Dec R. Ilsa Iha, PT, DPT 07/10/23, 12:50 PM

## 2023-07-10 NOTE — Assessment & Plan Note (Signed)
Blood pressure elevated.  Patient was not taking home antihypertensives due to significant medication intolerance. -Restarting home hydralazine -Continue to monitor

## 2023-07-10 NOTE — Assessment & Plan Note (Signed)
Likely multifactorial with recent fall.  No upper respiratory symptoms, mildly positive procalcitonin at 0.21, preliminary blood cultures negative.  UA was ordered on admission but never obtained. -Continue to monitor

## 2023-07-10 NOTE — Assessment & Plan Note (Signed)
Estimated body mass index is 43.49 kg/m as calculated from the following:   Height as of this encounter: 5' (1.524 m).   Weight as of this encounter: 101 kg.   -This will complicate overall prognosis

## 2023-07-10 NOTE — Assessment & Plan Note (Signed)
Patient with history of chronic constipation. -Continue with as needed lactulose -Current daily with linzess

## 2023-07-11 DIAGNOSIS — I82409 Acute embolism and thrombosis of unspecified deep veins of unspecified lower extremity: Secondary | ICD-10-CM | POA: Diagnosis not present

## 2023-07-11 DIAGNOSIS — S92351A Displaced fracture of fifth metatarsal bone, right foot, initial encounter for closed fracture: Secondary | ICD-10-CM | POA: Diagnosis not present

## 2023-07-11 DIAGNOSIS — I5023 Acute on chronic systolic (congestive) heart failure: Secondary | ICD-10-CM | POA: Diagnosis not present

## 2023-07-11 DIAGNOSIS — W19XXXD Unspecified fall, subsequent encounter: Secondary | ICD-10-CM | POA: Diagnosis not present

## 2023-07-11 DIAGNOSIS — Z880 Allergy status to penicillin: Secondary | ICD-10-CM | POA: Diagnosis not present

## 2023-07-11 DIAGNOSIS — Z881 Allergy status to other antibiotic agents status: Secondary | ICD-10-CM | POA: Diagnosis not present

## 2023-07-11 DIAGNOSIS — E7800 Pure hypercholesterolemia, unspecified: Secondary | ICD-10-CM | POA: Diagnosis not present

## 2023-07-11 DIAGNOSIS — I2510 Atherosclerotic heart disease of native coronary artery without angina pectoris: Secondary | ICD-10-CM | POA: Diagnosis not present

## 2023-07-11 DIAGNOSIS — Z85828 Personal history of other malignant neoplasm of skin: Secondary | ICD-10-CM | POA: Diagnosis not present

## 2023-07-11 DIAGNOSIS — S92341A Displaced fracture of fourth metatarsal bone, right foot, initial encounter for closed fracture: Secondary | ICD-10-CM | POA: Diagnosis not present

## 2023-07-11 DIAGNOSIS — R29898 Other symptoms and signs involving the musculoskeletal system: Secondary | ICD-10-CM | POA: Diagnosis not present

## 2023-07-11 DIAGNOSIS — K5900 Constipation, unspecified: Secondary | ICD-10-CM | POA: Diagnosis not present

## 2023-07-11 DIAGNOSIS — W19XXXA Unspecified fall, initial encounter: Secondary | ICD-10-CM | POA: Diagnosis present

## 2023-07-11 DIAGNOSIS — R296 Repeated falls: Secondary | ICD-10-CM | POA: Diagnosis not present

## 2023-07-11 DIAGNOSIS — N1831 Chronic kidney disease, stage 3a: Secondary | ICD-10-CM | POA: Diagnosis not present

## 2023-07-11 DIAGNOSIS — E559 Vitamin D deficiency, unspecified: Secondary | ICD-10-CM | POA: Diagnosis not present

## 2023-07-11 DIAGNOSIS — N390 Urinary tract infection, site not specified: Secondary | ICD-10-CM | POA: Diagnosis not present

## 2023-07-11 DIAGNOSIS — I129 Hypertensive chronic kidney disease with stage 1 through stage 4 chronic kidney disease, or unspecified chronic kidney disease: Secondary | ICD-10-CM | POA: Diagnosis not present

## 2023-07-11 DIAGNOSIS — Z01818 Encounter for other preprocedural examination: Secondary | ICD-10-CM | POA: Diagnosis not present

## 2023-07-11 DIAGNOSIS — E039 Hypothyroidism, unspecified: Secondary | ICD-10-CM | POA: Diagnosis not present

## 2023-07-11 DIAGNOSIS — M170 Bilateral primary osteoarthritis of knee: Secondary | ICD-10-CM | POA: Diagnosis not present

## 2023-07-11 DIAGNOSIS — I130 Hypertensive heart and chronic kidney disease with heart failure and stage 1 through stage 4 chronic kidney disease, or unspecified chronic kidney disease: Secondary | ICD-10-CM | POA: Diagnosis not present

## 2023-07-11 DIAGNOSIS — I509 Heart failure, unspecified: Secondary | ICD-10-CM | POA: Diagnosis not present

## 2023-07-11 DIAGNOSIS — D509 Iron deficiency anemia, unspecified: Secondary | ICD-10-CM | POA: Diagnosis not present

## 2023-07-11 DIAGNOSIS — Z888 Allergy status to other drugs, medicaments and biological substances status: Secondary | ICD-10-CM | POA: Diagnosis not present

## 2023-07-11 DIAGNOSIS — Z9181 History of falling: Secondary | ICD-10-CM | POA: Diagnosis not present

## 2023-07-11 DIAGNOSIS — K219 Gastro-esophageal reflux disease without esophagitis: Secondary | ICD-10-CM | POA: Diagnosis not present

## 2023-07-11 DIAGNOSIS — S93402D Sprain of unspecified ligament of left ankle, subsequent encounter: Secondary | ICD-10-CM | POA: Diagnosis not present

## 2023-07-11 DIAGNOSIS — Y92009 Unspecified place in unspecified non-institutional (private) residence as the place of occurrence of the external cause: Secondary | ICD-10-CM | POA: Diagnosis not present

## 2023-07-11 DIAGNOSIS — R262 Difficulty in walking, not elsewhere classified: Secondary | ICD-10-CM | POA: Diagnosis not present

## 2023-07-11 DIAGNOSIS — I10 Essential (primary) hypertension: Secondary | ICD-10-CM | POA: Diagnosis not present

## 2023-07-11 DIAGNOSIS — E538 Deficiency of other specified B group vitamins: Secondary | ICD-10-CM | POA: Diagnosis not present

## 2023-07-11 DIAGNOSIS — I82412 Acute embolism and thrombosis of left femoral vein: Secondary | ICD-10-CM | POA: Diagnosis not present

## 2023-07-11 DIAGNOSIS — Z86718 Personal history of other venous thrombosis and embolism: Secondary | ICD-10-CM | POA: Diagnosis not present

## 2023-07-11 DIAGNOSIS — S92351D Displaced fracture of fifth metatarsal bone, right foot, subsequent encounter for fracture with routine healing: Secondary | ICD-10-CM | POA: Diagnosis not present

## 2023-07-11 DIAGNOSIS — E785 Hyperlipidemia, unspecified: Secondary | ICD-10-CM | POA: Diagnosis not present

## 2023-07-11 DIAGNOSIS — E66813 Obesity, class 3: Secondary | ICD-10-CM | POA: Diagnosis not present

## 2023-07-11 DIAGNOSIS — Z7401 Bed confinement status: Secondary | ICD-10-CM | POA: Diagnosis not present

## 2023-07-11 DIAGNOSIS — Z7901 Long term (current) use of anticoagulants: Secondary | ICD-10-CM | POA: Diagnosis not present

## 2023-07-11 DIAGNOSIS — S92341D Displaced fracture of fourth metatarsal bone, right foot, subsequent encounter for fracture with routine healing: Secondary | ICD-10-CM | POA: Diagnosis not present

## 2023-07-11 DIAGNOSIS — R0689 Other abnormalities of breathing: Secondary | ICD-10-CM | POA: Diagnosis not present

## 2023-07-11 DIAGNOSIS — K5909 Other constipation: Secondary | ICD-10-CM | POA: Diagnosis not present

## 2023-07-11 DIAGNOSIS — S93402A Sprain of unspecified ligament of left ankle, initial encounter: Secondary | ICD-10-CM | POA: Diagnosis not present

## 2023-07-11 DIAGNOSIS — S92334A Nondisplaced fracture of third metatarsal bone, right foot, initial encounter for closed fracture: Secondary | ICD-10-CM | POA: Diagnosis not present

## 2023-07-11 DIAGNOSIS — S92334D Nondisplaced fracture of third metatarsal bone, right foot, subsequent encounter for fracture with routine healing: Secondary | ICD-10-CM | POA: Diagnosis not present

## 2023-07-11 DIAGNOSIS — M109 Gout, unspecified: Secondary | ICD-10-CM | POA: Diagnosis not present

## 2023-07-11 DIAGNOSIS — Z96653 Presence of artificial knee joint, bilateral: Secondary | ICD-10-CM | POA: Diagnosis not present

## 2023-07-11 DIAGNOSIS — D72829 Elevated white blood cell count, unspecified: Secondary | ICD-10-CM | POA: Diagnosis not present

## 2023-07-11 DIAGNOSIS — Z6841 Body Mass Index (BMI) 40.0 and over, adult: Secondary | ICD-10-CM | POA: Diagnosis not present

## 2023-07-11 LAB — CBC
HCT: 25.2 % — ABNORMAL LOW (ref 36.0–46.0)
HCT: 27.4 % — ABNORMAL LOW (ref 36.0–46.0)
Hemoglobin: 7.9 g/dL — ABNORMAL LOW (ref 12.0–15.0)
Hemoglobin: 8.3 g/dL — ABNORMAL LOW (ref 12.0–15.0)
MCH: 28.3 pg (ref 26.0–34.0)
MCH: 28.2 pg (ref 26.0–34.0)
MCHC: 31.3 g/dL (ref 30.0–36.0)
MCHC: 30.3 g/dL (ref 30.0–36.0)
MCV: 90.3 fL (ref 80.0–100.0)
MCV: 93.2 fL (ref 80.0–100.0)
Platelets: 404 10*3/uL — ABNORMAL HIGH (ref 150–400)
Platelets: 404 10*3/uL — ABNORMAL HIGH (ref 150–400)
RBC: 2.79 MIL/uL — ABNORMAL LOW (ref 3.87–5.11)
RBC: 2.94 MIL/uL — ABNORMAL LOW (ref 3.87–5.11)
RDW: 15.5 % (ref 11.5–15.5)
RDW: 15.7 % — ABNORMAL HIGH (ref 11.5–15.5)
WBC: 11.6 10*3/uL — ABNORMAL HIGH (ref 4.0–10.5)
WBC: 11.9 10*3/uL — ABNORMAL HIGH (ref 4.0–10.5)
nRBC: 0 % (ref 0.0–0.2)
nRBC: 0 % (ref 0.0–0.2)

## 2023-07-11 LAB — BASIC METABOLIC PANEL
Anion gap: 10 (ref 5–15)
BUN: 24 mg/dL — ABNORMAL HIGH (ref 8–23)
CO2: 24 mmol/L (ref 22–32)
Calcium: 8.2 mg/dL — ABNORMAL LOW (ref 8.9–10.3)
Chloride: 101 mmol/L (ref 98–111)
Creatinine, Ser: 1.18 mg/dL — ABNORMAL HIGH (ref 0.44–1.00)
GFR, Estimated: 44 mL/min — ABNORMAL LOW (ref >60)
Glucose, Bld: 124 mg/dL — ABNORMAL HIGH (ref 70–99)
Potassium: 3.3 mmol/L — ABNORMAL LOW (ref 3.5–5.1)
Sodium: 135 mmol/L (ref 135–145)

## 2023-07-11 LAB — URINALYSIS, ROUTINE W REFLEX MICROSCOPIC
Bilirubin Urine: NEGATIVE
Glucose, UA: NEGATIVE mg/dL
Ketones, ur: NEGATIVE mg/dL
Nitrite: NEGATIVE
Protein, ur: NEGATIVE mg/dL
Specific Gravity, Urine: 1.017 (ref 1.005–1.030)
pH: 6 (ref 5.0–8.0)

## 2023-07-11 LAB — VITAMIN D 25 HYDROXY (VIT D DEFICIENCY, FRACTURES): Vit D, 25-Hydroxy: 25.87 ng/mL — ABNORMAL LOW (ref 30–100)

## 2023-07-11 LAB — OCCULT BLOOD X 1 CARD TO LAB, STOOL: Fecal Occult Bld: NEGATIVE

## 2023-07-11 MED ORDER — VITAMIN B-12 1000 MCG PO TABS
1000.0000 ug | ORAL_TABLET | Freq: Every day | ORAL | Status: DC
  Administered 2023-07-11 – 2023-07-16 (×6): 1000 ug via ORAL
  Filled 2023-07-11 (×6): qty 1

## 2023-07-11 MED ORDER — POTASSIUM CHLORIDE CRYS ER 20 MEQ PO TBCR
40.0000 meq | EXTENDED_RELEASE_TABLET | Freq: Once | ORAL | Status: AC
  Administered 2023-07-11: 40 meq via ORAL
  Filled 2023-07-11: qty 2

## 2023-07-11 MED ORDER — VITAMIN D (ERGOCALCIFEROL) 1.25 MG (50000 UNIT) PO CAPS
50000.0000 [IU] | ORAL_CAPSULE | ORAL | Status: DC
  Administered 2023-07-11: 50000 [IU] via ORAL
  Filled 2023-07-11: qty 1

## 2023-07-11 NOTE — Assessment & Plan Note (Signed)
-  Continue home Synthroid -Checking TSH with a.m. lab due to persistent feeling lethargic and weak

## 2023-07-11 NOTE — Plan of Care (Signed)
  Problem: Education: Goal: Knowledge of General Education information will improve Description: Including pain rating scale, medication(s)/side effects and non-pharmacologic comfort measures Outcome: Progressing   Problem: Health Behavior/Discharge Planning: Goal: Ability to manage health-related needs will improve Outcome: Progressing   Problem: Clinical Measurements: Goal: Ability to maintain clinical measurements within normal limits will improve Outcome: Progressing Goal: Will remain free from infection Outcome: Progressing Goal: Cardiovascular complication will be avoided Outcome: Progressing   Problem: Activity: Goal: Risk for activity intolerance will decrease Outcome: Progressing   Problem: Nutrition: Goal: Adequate nutrition will be maintained Outcome: Progressing   Problem: Coping: Goal: Level of anxiety will decrease Outcome: Progressing

## 2023-07-11 NOTE — Progress Notes (Signed)
Urine specimen obtained via external cath.

## 2023-07-11 NOTE — TOC Progression Note (Signed)
Transition of Care [facility]) - Progression Note    Patient Details  Name: Kayla Chan MRN: [id number] Date of Birth: 06-28-1932  Transition of Care [facility]) CM/SW Contact  Bing Quarry, RN Phone Number: 07/11/2023, 7:28 PM  Clinical Narrative: 11/17: New SNF recommendation. Family offered first that [facility] is first preference for SNF/STR and patient/family do not want [facility]. 730 pm   Gabriel Cirri MSN RN CM  Care Management Department.  [place]  [facility] Direct Dial: [phone] Main Office Phone: [phone] Weekends Only              Expected Discharge Plan and Services                                               Social Determinants of Health (SDOH) Interventions SDOH Screenings   Food Insecurity: No Food Insecurity (07/09/2023)  Housing: Low Risk  (07/09/2023)  Transportation Needs: No Transportation Needs (07/09/2023)  Utilities: Not At Risk (07/09/2023)  Alcohol Screen: Low Risk  (10/02/2020)  Depression (PHQ2-9): Low Risk  (01/08/2022)  Financial Resource Strain: Low Risk  (10/02/2020)  Physical Activity: Inactive (10/02/2020)  Social Connections: Moderately Integrated (10/02/2020)  Stress: No Stress Concern Present (10/02/2020)  Tobacco Use: Low Risk  (07/09/2023)    Readmission Risk Interventions     No data to display

## 2023-07-11 NOTE — Assessment & Plan Note (Signed)
Frequent fall. Right metatarsal fractures secondary to fall. Ankle sprain.  Patient with progressively worsening ambulation and history of falls.  Resulted in right mild to moderate displaced fourth and fifth metatarsal fractures with some ankle sprain.  Podiatry was consulted and they are recommending cam boot, weightbearing as tolerated and outpatient follow-up. PT is recommending SNF TOC consult for SNF placement -Continue with pain management

## 2023-07-11 NOTE — Progress Notes (Signed)
Progress Note   Patient: Kayla Chan WNU:[id number] DOB: Feb 07, 1932 DOA: 07/09/2023     0 DOS: the patient was seen and examined on 07/11/2023   Brief hospital course: Taken from H&P.  Syd Beare is a 89 y.o. female with medical history significant of chronic ambulation dysfunction secondary to bilateral severe knee OA on roller walker at baseline, chronic HFrEF with LVEF 40-50%, CKD stage III, DVT on Xarelto, HTN, HLD, hypothyroidism, peptic ulcer on PPI, came from home for fall and right foot pain.   With severe bilateral knee OA, patient uses roller walker to ambulate at baseline for the last 3 years. She has unsteady gait at baseline. And 3 weeks ago she fell and twisted left ankle. Then she went to see urgent care and x-ray showed no fracture or dislocation and patient was given a cam boot and sent home.   On presentation vitals stable, chest x-ray with mild pulmonary congestion.  Labs with hemoglobin of 9.6, leukocytosis at 12.3, creatinine 1.1 and potassium of 3.5. DG right ankle and foot with comminuted and displaced fifth distal metatarsal shaft fracture, mildly displaced and angulated fourth metatarsal neck fracture and a nondisplaced fracture of distal third metatarsal neck.  And likely ligamental strain on ankle as there was no fracture noted.  Generalized soft tissue edema of foot or neck.  Patient was given ceftriaxone and Zithromax in ED. which were not continued.  Podiatry was consulted and they were recommending cam boot and outpatient follow-up in 4 weeks.  PT and OT evaluation ordered.  Patient will remain weightbearing as tolerated  11/16: Mildly elevated blood pressure, procalcitonin at 0.21, preliminary blood cultures negative in 24-hour.  Ordered Levaquin. PT is recommending SNF, TOC was consulted.  11/17: Vital stable, vitamin D levels low at 25-ordered replacement.  B12 borderline at 400, also started on supplement.  Potassium at 3.3 which is being repleted.   Hemoglobin at 8.3, slowly decreasing, FOBT negative.  Ordered TSH with morning lab    Assessment and Plan: * Impaired ambulation Frequent fall. Right metatarsal fractures secondary to fall. Ankle sprain.  Patient with progressively worsening ambulation and history of falls.  Resulted in right mild to moderate displaced fourth and fifth metatarsal fractures with some ankle sprain.  Podiatry was consulted and they are recommending cam boot, weightbearing as tolerated and outpatient follow-up. PT is recommending SNF TOC consult for SNF placement -Continue with pain management  Personal history of venous thrombosis and embolism History of DVT s/p thrombectomy. -No acute concern -Continue with home Xarelto  Leukocytosis Likely multifactorial with recent fall.  No upper respiratory symptoms, mildly positive procalcitonin at 0.21, preliminary blood cultures negative.  UA was ordered on admission but never obtained. -Continue to monitor  Essential hypertension Blood pressure elevated.  Patient was not taking home antihypertensives due to significant medication intolerance. -Restarting home hydralazine -Continue to monitor  Hypothyroidism -Continue home Synthroid -Checking TSH with a.m. lab due to persistent feeling lethargic and weak  Obesity, Class III, BMI 40-49.9 (morbid obesity) (HCC) Estimated body mass index is 43.49 kg/m as calculated from the following:   Height as of this encounter: 5' (1.524 m).   Weight as of this encounter: 101 kg.   -This will complicate overall prognosis  Chronic kidney disease, stage 3a (HCC) Creatinine seems to be within baseline. -Monitor renal function -Avoid nephrotoxin  Constipation Patient with history of chronic constipation. -Continue with as needed lactulose -Current daily with linzess   Subjective: Patient just not feeling well overall.  No  upper respiratory or urinary symptoms, just feeling weak.  Physical Exam: Vitals:    07/10/23 2018 07/10/23 2042 07/11/23 0342 07/11/23 0815  BP: (!) 143/50 (!) 136/44 (!) 143/40 113/76  Pulse: 86 89 83 80  Resp: 16 20 16 16   Temp: 99.1 F (37.3 C) 99 F (37.2 C) 98.3 F (36.8 C) 98.7 F (37.1 C)  TempSrc: Oral Oral Oral   SpO2: 95% 94% 94% 95%  Weight:      Height:       General.  Frail elderly lady, in no acute distress. Pulmonary.  Lungs clear bilaterally, normal respiratory effort. CV.  Regular rate and rhythm, no JVD, rub or murmur. Abdomen.  Soft, nontender, nondistended, BS positive. CNS.  Alert and oriented .  No focal neurologic deficit. Extremities.  No edema, no cyanosis, pulses intact and symmetrical.   Data Reviewed: Prior data reviewed  Family Communication: Discussed with husband at bedside  Disposition: Status is: Observation The patient remains OBS appropriate and will d/c before 2 midnights.  Planned Discharge Destination: Skilled nursing facility  DVT prophylaxis.  Xarelto Time spent: 44 minutes  This record has been created using Conservation officer, historic buildings. Errors have been sought and corrected,but may not always be located. Such creation errors do not reflect on the standard of care.   Author: Arnetha Courser, MD 07/11/2023 2:45 PM  For on call review [email or web address].

## 2023-07-11 NOTE — Assessment & Plan Note (Signed)
Likely multifactorial with recent fall.  No upper respiratory symptoms, mildly positive procalcitonin at 0.21, preliminary blood cultures negative.  UA was ordered on admission but never obtained. -Continue to monitor

## 2023-07-11 NOTE — Plan of Care (Signed)

## 2023-07-11 NOTE — Treatment Plan (Signed)
Vit D low. Recommend treating w/ 50,000 international units weekly for 8 weeks for fracture healing

## 2023-07-12 DIAGNOSIS — R262 Difficulty in walking, not elsewhere classified: Secondary | ICD-10-CM | POA: Diagnosis not present

## 2023-07-12 DIAGNOSIS — Z01818 Encounter for other preprocedural examination: Secondary | ICD-10-CM | POA: Diagnosis not present

## 2023-07-12 DIAGNOSIS — I509 Heart failure, unspecified: Secondary | ICD-10-CM | POA: Diagnosis not present

## 2023-07-12 LAB — BASIC METABOLIC PANEL
Anion gap: 11 (ref 5–15)
BUN: 27 mg/dL — ABNORMAL HIGH (ref 8–23)
CO2: 23 mmol/L (ref 22–32)
Calcium: 8.4 mg/dL — ABNORMAL LOW (ref 8.9–10.3)
Chloride: 105 mmol/L (ref 98–111)
Creatinine, Ser: 1.04 mg/dL — ABNORMAL HIGH (ref 0.44–1.00)
GFR, Estimated: 51 mL/min — ABNORMAL LOW (ref >60)
Glucose, Bld: 123 mg/dL — ABNORMAL HIGH (ref 70–99)
Potassium: 3.5 mmol/L (ref 3.5–5.1)
Sodium: 139 mmol/L (ref 135–145)

## 2023-07-12 LAB — CBC
HCT: 26.3 % — ABNORMAL LOW (ref 36.0–46.0)
Hemoglobin: 8.3 g/dL — ABNORMAL LOW (ref 12.0–15.0)
MCH: 29.4 pg (ref 26.0–34.0)
MCHC: 31.6 g/dL (ref 30.0–36.0)
MCV: 93.3 fL (ref 80.0–100.0)
Platelets: 427 10*3/uL — ABNORMAL HIGH (ref 150–400)
RBC: 2.82 MIL/uL — ABNORMAL LOW (ref 3.87–5.11)
RDW: 15.6 % — ABNORMAL HIGH (ref 11.5–15.5)
WBC: 11.6 10*3/uL — ABNORMAL HIGH (ref 4.0–10.5)
nRBC: 0 % (ref 0.0–0.2)

## 2023-07-12 LAB — TSH: TSH: 2.408 u[IU]/mL (ref 0.350–4.500)

## 2023-07-12 NOTE — [place] FL2 (Signed)
[place] MEDICAID FL2 LEVEL OF CARE FORM     IDENTIFICATION  Patient Name: Kayla Chan Birthdate: 06-29-1932 Sex: female Admission Date (Current Location): 07/09/2023  [facility] and [place] Number:  Chiropodist and Address:         Provider Number: [id number]  Attending Physician Name and Address:  Loyce Dys, MD  Relative Name and Phone Number:       Current Level of Care: Hospital Recommended Level of Care: Skilled Nursing Facility Prior Approval Number:    Date Approved/Denied:   PASRR Number: [id number]  Discharge Plan: Home    Current Diagnoses: Patient Active Problem List   Diagnosis Date Noted   Leukocytosis 07/10/2023   Closed fracture of fourth metatarsal of right foot 07/10/2023   Closed displaced fracture of fifth metatarsal bone of right foot 07/10/2023   Closed nondisplaced fracture of third metatarsal bone of right foot 07/10/2023   Impaired ambulation 07/09/2023   DVT (deep venous thrombosis) (HCC) 05/18/2023   Lymphedema 06/16/2022   Bilateral carotid artery stenosis 03/11/2021   Bilateral leg edema 03/11/2021   Cardiac syncope 02/04/2021   Dizziness 02/04/2021   Bruising    Baker's cyst, ruptured    Impaired fasting glucose    Lower extremity cellulitis 12/02/2020   Cellulitis and abscess of foot 12/01/2020   Bilateral impacted cerumen 01/25/2020   Non-recurrent acute serous otitis media of right ear 01/25/2020   Otitis externa 01/25/2020   Hearing aid worn 01/25/2020   At risk for polypharmacy 01/10/2020   Dizzinesses 01/10/2020   Decreased GFR 01/10/2020   Obesity, Class III, BMI 40-49.9 (morbid obesity) (HCC) 11/23/2018   Chronic kidney disease, stage 3a (HCC) 11/09/2018   Stage 3 chronic kidney disease (HCC) 11/09/2018   Limited mobility 09/03/2017   Fatigue 02/04/2016   Palpitations 02/04/2016   Gout 09/24/2015   Hyperuricemia 08/20/2015   Back pain 06/28/2015   Constipation 06/28/2015   Eczema 06/28/2015    Edema 06/28/2015   Pre-diabetes 06/28/2015   Intertrigo 06/28/2015   PAC (premature atrial contraction) 06/28/2015   Premature heartbeats 06/28/2015   History of basal cell cancer 02/18/2015   Personal history of skin cancer 02/18/2015   Fatty infiltration of liver 05/31/2013   Hypothyroidism 01/15/2011   Arthropathy of pelvic region and thigh 08/01/2008   HLD (hyperlipidemia) 03/13/2008   Arthritis, degenerative 01/26/2007   Essential hypertension 12/13/2006   Personal history of venous thrombosis and embolism 05/24/2006    Orientation RESPIRATION BLADDER Height & Weight     Self, Time, Situation, Place  Normal Incontinent Weight: 101 kg Height:  5' (152.4 cm)  BEHAVIORAL SYMPTOMS/MOOD NEUROLOGICAL BOWEL NUTRITION STATUS      Incontinent Diet (Heart Healthy)  AMBULATORY STATUS COMMUNICATION OF NEEDS Skin   Extensive Assist   Normal                       Personal Care Assistance Level of Assistance              Functional Limitations Info  Hearing   Hearing Info: Impaired      SPECIAL CARE FACTORS FREQUENCY  PT (By licensed PT), OT (By licensed OT)                    Contractures Contractures Info: Not present    Additional Factors Info  Code Status, Allergies Code Status Info: Full Allergies Info: Lisinopril, Amlodipine, Atorvastatin, Bactrim (Sulfamethoxazole-trimethoprim), Ciprofloxacin, Colestid  (Colestipol Hcl), Colestipol, Crestor  (Rosuvastatin  Calcium), Doxycycline, Fluvastatin Sodium, Furosemide, Labetalol, Losartan, Lotensin (Benazepril), Pravastatin Sodium, Prednisone, Rosuvastatin, Verapamil, Allopurinol, Penicillins           Current Medications (07/12/2023):  This is the current hospital active medication list Current Facility-Administered Medications  Medication Dose Route Frequency Provider Last Rate Last Admin   acetaminophen (TYLENOL) tablet 650 mg  650 mg Oral Q6H PRN Mikey College T, MD   650 mg at 07/09/23 2203    cyanocobalamin (VITAMIN B12) tablet 1,000 mcg  1,000 mcg Oral Daily Arnetha Courser, MD   1,000 mcg at 07/12/23 1008   docusate sodium (COLACE) capsule 100 mg  100 mg Oral BID Mikey College T, MD   100 mg at 07/11/23 0946   ezetimibe (ZETIA) tablet 10 mg  10 mg Oral Daily Mikey College T, MD   10 mg at 07/12/23 1008   hydrALAZINE (APRESOLINE) injection 5 mg  5 mg Intravenous Q6H PRN Mikey College T, MD   5 mg at 07/10/23 0402   hydrALAZINE (APRESOLINE) tablet 25 mg  25 mg Oral TID Arnetha Courser, MD   25 mg at 07/12/23 1008   HYDROmorphone (DILAUDID) injection 0.5 mg  0.5 mg Intravenous Q4H PRN Mikey College T, MD   0.5 mg at 07/10/23 0855   lactulose (CHRONULAC) 10 GM/15ML solution 30 g  30 g Oral BID PRN Arnetha Courser, MD       levofloxacin (LEVAQUIN) tablet 250 mg  250 mg Oral Daily Rosezetta Schlatter T, MD   250 mg at 07/12/23 1008   levothyroxine (SYNTHROID) tablet 75 mcg  75 mcg Oral Daily Mikey College T, MD   75 mcg at 07/12/23 0439   linaclotide (LINZESS) capsule 72 mcg  72 mcg Oral QAC breakfast Mikey College T, MD   72 mcg at 07/11/23 0947   ondansetron (ZOFRAN) tablet 4 mg  4 mg Oral Q6H PRN Mikey College T, MD       Or   ondansetron [facility]) injection 4 mg  4 mg Intravenous Q6H PRN Mikey College T, MD   4 mg at 07/11/23 1901   pantoprazole (PROTONIX) EC tablet 40 mg  40 mg Oral Daily Mikey College T, MD   40 mg at 07/12/23 1008   rivaroxaban (XARELTO) tablet 20 mg  20 mg Oral Q supper Mikey College T, MD   20 mg at 07/11/23 1739   Vitamin D (Ergocalciferol) (DRISDOL) 1.25 MG (50000 UNIT) capsule 50,000 Units  50,000 Units Oral Q7 days Arnetha Courser, MD   50,000 Units at 07/11/23 1739     Discharge Medications: Please see discharge summary for a list of discharge medications.  Relevant Imaging Results:  Relevant Lab Results:   Additional Information ss [id number]  Chapman Fitch, RN

## 2023-07-12 NOTE — TOC Progression Note (Addendum)
Transition of Care [facility]) - Progression Note    Patient Details  Name: Kayla Chan MRN: [id number] Date of Birth: 08-27-1931  [facility] CM/SW Contact  Chapman Fitch, RN Phone Number: 07/12/2023, 2:05 PM  Clinical Narrative:     Received messages from bedside RN that patient's 1 choice for SNF is [facility], and 2nd choice is [facility]  PASRR obtained,  Fl2 sent for signature Bed search initiated   Message sent to Tiffany at [employer], and Tammy at [facility] to request review     Expected Discharge Plan and Services                                               Social Determinants of Health (SDOH) Interventions SDOH Screenings   Food Insecurity: No Food Insecurity (07/09/2023)  Housing: Low Risk  (07/09/2023)  Transportation Needs: No Transportation Needs (07/09/2023)  Utilities: Not At Risk (07/09/2023)  Alcohol Screen: Low Risk  (10/02/2020)  Depression (PHQ2-9): Low Risk  (01/08/2022)  Financial Resource Strain: Low Risk  (10/02/2020)  Physical Activity: Inactive (10/02/2020)  Social Connections: Moderately Integrated (10/02/2020)  Stress: No Stress Concern Present (10/02/2020)  Tobacco Use: Low Risk  (07/09/2023)    Readmission Risk Interventions     No data to display

## 2023-07-12 NOTE — Progress Notes (Signed)
Progress Note   Patient: Kayla Chan XBJ:[id number] DOB: 01/14/1932 DOA: 07/09/2023     1 DOS: the patient was seen and examined on 07/12/2023     Brief hospital course: Taken from H&P.   [place] Selkirk is a 89 y.o. female with medical history significant of chronic ambulation dysfunction secondary to bilateral severe knee OA on roller walker at baseline, chronic HFrEF with LVEF 40-50%, CKD stage III, DVT on Xarelto, HTN, HLD, hypothyroidism, peptic ulcer on PPI, came from home for fall and right foot pain.    With severe bilateral knee OA, patient uses roller walker to ambulate at baseline for the last 3 years. She has unsteady gait at baseline. And 3 weeks ago she fell and twisted left ankle. Then she went to see urgent care and x-ray showed no fracture or dislocation and patient was given a cam boot and sent home.    On presentation vitals stable, chest x-ray with mild pulmonary congestion.  Labs with hemoglobin of 9.6, leukocytosis at 12.3, creatinine 1.1 and potassium of 3.5. DG right ankle and foot with comminuted and displaced fifth distal metatarsal shaft fracture, mildly displaced and angulated fourth metatarsal neck fracture and a nondisplaced fracture of distal third metatarsal neck.  And likely ligamental strain on ankle as there was no fracture noted.  Generalized soft tissue edema of foot or neck.   Patient was given ceftriaxone and Zithromax in ED. which were not continued.   Podiatry was consulted and they were recommending cam boot and outpatient follow-up in 4 weeks.  PT and OT evaluation ordered.  Patient will remain weightbearing as tolerated   11/16: Mildly elevated blood pressure, procalcitonin at 0.21, preliminary blood cultures negative in 24-hour.  Ordered Levaquin. PT is recommending SNF, TOC was consulted.   11/17: Vital stable, vitamin D levels low at 25-ordered replacement.  B12 borderline at 400, also started on supplement.  Potassium at 3.3 which is being  repleted.  Hemoglobin at 8.3, slowly decreasing, FOBT negative.  Ordered TSH with morning lab       Assessment and Plan: * Impaired ambulation Frequent fall. Right metatarsal fractures secondary to fall. Ankle sprain.   Patient with progressively worsening ambulation and history of falls.  Resulted in right mild to moderate displaced fourth and fifth metatarsal fractures with some ankle sprain.  Podiatry was consulted and they are recommending cam boot, weightbearing as tolerated and outpatient follow-up. PT is recommending SNF TOC consult for SNF placement -Continue with pain management   Personal history of venous thrombosis and embolism History of DVT s/p thrombectomy. -No acute concern Continue with home Xarelto   Leukocytosis Likely multifactorial with recent fall.  No upper respiratory symptoms, mildly positive procalcitonin at 0.21, preliminary blood cultures negative.  UA was ordered on admission but never obtained. -Continue to monitor   Essential hypertension Blood pressure elevated.  Patient was not taking home antihypertensives due to significant medication intolerance. Continue hydralazine   Hypothyroidism Continue home Synthroid -Checking TSH with a.m. lab due to persistent feeling lethargic and weak   Obesity, Class III, BMI 40-49.9 (morbid obesity) (HCC) Estimated body mass index is 43.49 kg/m as calculated from the following:   Height as of this encounter: 5' (1.524 m).   Weight as of this encounter: 101 kg.    -This will complicate overall prognosis   Chronic kidney disease, stage 3a (HCC) Creatinine seems to be within baseline. -Monitor renal function Avoid nephrotoxin   Constipation Patient with history of chronic constipation. -Continue with as  needed lactulose -Current daily with linzess     Subjective:  Patient seen and examined at bedside this morning in the presence of the husband Admits to improvement in foot pain TOC working on  placement to [employer] comments   Physical Exam:  General.  Frail elderly lady, in no acute distress. Pulmonary.  Lungs clear bilaterally, normal respiratory effort. CV.  Regular rate and rhythm, no JVD, rub or murmur. Abdomen.  Soft, nontender, nondistended, BS positive. CNS.  Alert and oriented .  No focal neurologic deficit. Extremities.  No edema, no cyanosis, pulses intact and symmetrical.      Family Communication: Discussed with husband at bedside   Disposition: Pending skilled nursing facility placement   DVT prophylaxis.  Xarelto Time spent: 44 minutes   Data Reviewed:    Latest Ref Rng & Units 07/12/2023    4:29 AM 07/11/2023    4:50 AM 07/09/2023   11:27 AM  BMP  Glucose 70 - 99 mg/dL 409  811  914   BUN 8 - 23 mg/dL 27  24  18    Creatinine 0.44 - 1.00 mg/dL 7.82  9.56  2.13   Sodium 135 - 145 mmol/L 139  135  135   Potassium 3.5 - 5.1 mmol/L 3.5  3.3  3.5   Chloride 98 - 111 mmol/L 105  101  104   CO2 22 - 32 mmol/L 23  24  20    Calcium 8.9 - 10.3 mg/dL 8.4  8.2  8.6        Latest Ref Rng & Units 07/12/2023    4:29 AM 07/11/2023    9:28 AM 07/11/2023    4:50 AM  CBC  WBC 4.0 - 10.5 K/uL 11.6  11.9  11.6   Hemoglobin 12.0 - 15.0 g/dL 8.3  8.3  7.9   Hematocrit 36.0 - 46.0 % 26.3  27.4  25.2   Platelets 150 - 400 K/uL 427  404  404      Vitals:   07/11/23 1615 07/11/23 1950 07/12/23 0451 07/12/23 0822  BP: (!) 148/50 (!) 143/50 (!) 150/51 (!) 157/58  Pulse: (!) 102 98 85 79  Resp: 18 18 18 16   Temp: 98.3 F (36.8 C) 98.4 F (36.9 C) 98.6 F (37 C) 97.7 F (36.5 C)  TempSrc: Oral   Oral  SpO2: 95% 96% 96% 95%  Weight:      Height:         Author: Loyce Dys, MD 07/12/2023 3:30 PM  For on call review [email or web address].

## 2023-07-12 NOTE — Plan of Care (Signed)

## 2023-07-12 NOTE — Consult Note (Signed)
Kayla Chan is a 89 y.o. female  [id number]  Primary Cardiologist: Adrian Blackwater Reason for Consultation: Clearance for endoscopy  HPI: This 89 year old white female who has a history of congestive heart failure and shortness of breath and is a scheduled for outpatient echo and a stress test.  She in the meantime came to the hospital with difficulty swallowing and needs endoscopy and I was asked to evaluate the patient for clearance.  Patient denies any chest pain or shortness of breath at this time.   Review of Systems: No orthopnea PND or leg swelling   Past Medical History:  Diagnosis Date   Cancer (HCC)    BCC on nose   Carotid artery occlusion    CHF (congestive heart failure) (HCC)    Chronic kidney disease    stage III   DVT, femoral, acute (HCC) 05/20/2023   Thrombectomy with stent placement   Gout    Hyperlipidemia    Hypertension    Hypothyroidism    Lymphedema    Wears hearing aid in both ears     Medications Prior to Admission  Medication Sig Dispense Refill   cloNIDine (CATAPRES) 0.1 MG tablet Take by mouth.     ezetimibe (ZETIA) 10 MG tablet Take 1 tablet (10 mg total) by mouth daily. 90 tablet 1   levothyroxine (SYNTHROID) 75 MCG tablet TAKE 1 TABLET BY MOUTH EVERY DAY 90 tablet 2   linaclotide (LINZESS) 72 MCG capsule Take 1 capsule (72 mcg total) by mouth daily before breakfast. 90 capsule 3   rivaroxaban (XARELTO) 20 MG TABS tablet Take 1 tablet (20 mg total) by mouth daily with supper. 30 tablet 6   Ascorbic Acid (VITAMIN C PO) Take by mouth daily.     aspirin 81 MG tablet Take 81 mg by mouth daily. (Patient not taking: Reported on 07/06/2023)     cyanocobalamin (VITAMIN B12) 1000 MCG tablet Take by mouth. (Patient not taking: Reported on 07/06/2023)     diltiazem (CARDIZEM) 120 MG tablet Take 120 mg by mouth 4 (four) times daily. (Patient not taking: Reported on 07/06/2023)     docusate sodium (COLACE) 100 MG capsule Take 100 mg by mouth 2 (two)  times daily. (Patient not taking: Reported on 07/06/2023)     esomeprazole (NEXIUM) 20 MG capsule Take 1 capsule (20 mg total) by mouth daily at 12 noon. (Patient taking differently: Take 40 mg by mouth daily at 12 noon.) 90 capsule 3   hydrALAZINE (APRESOLINE) 25 MG tablet Take 25 mg by mouth 3 (three) times daily. (Patient not taking: Reported on 07/06/2023)     HYDROcodone-acetaminophen (NORCO) 5-325 MG tablet Take 1 tablet by mouth every 4 (four) hours as needed for moderate pain. (Patient not taking: Reported on 07/06/2023) 6 tablet 0   lactulose (CHRONULAC) 10 GM/15ML solution Take by mouth 3 (three) times daily. (Patient not taking: Reported on 07/06/2023)     meloxicam (MOBIC) 15 MG tablet Take 1 tablet (15 mg total) by mouth daily. (Patient not taking: Reported on 07/06/2023) 30 tablet 2   Multiple Vitamin (MULTIVITAMIN) tablet Take 1 tablet by mouth daily.     sucralfate (CARAFATE) 1 g tablet Take 1 tablet (1 g total) by mouth 3 (three) times daily before meals. (Patient not taking: Reported on 07/06/2023) 90 tablet 2      vitamin B-12  1,000 mcg Oral Daily   docusate sodium  100 mg Oral BID   ezetimibe  10 mg Oral Daily   hydrALAZINE  25 mg Oral TID   levofloxacin  250 mg Oral Daily   levothyroxine  75 mcg Oral Daily   linaclotide  72 mcg Oral QAC breakfast   pantoprazole  40 mg Oral Daily   rivaroxaban  20 mg Oral Q supper   Vitamin D (Ergocalciferol)  50,000 Units Oral Q7 days    Infusions:   Allergies  Allergen Reactions   Lisinopril     Intense itching per patient can not tolerate    Amlodipine     dizziness   Atorvastatin     Other reaction(s): Muscle Pain   Bactrim [Sulfamethoxazole-Trimethoprim] Itching   Ciprofloxacin     dizziness   Colestid  [Colestipol Hcl]     Itching, insomnia   Colestipol Other (See Comments)    insomnia   Crestor  [Rosuvastatin Calcium]     Other reaction(s): Muscle Pain   Doxycycline    Fluvastatin Sodium     Other reaction(s):  Muscle Pain   Furosemide     Severe Cramping   Labetalol Other (See Comments)    Dizziness   Losartan     itching   Lotensin [Benazepril]     unknown   Pravastatin Sodium     Numbness in legs   Prednisone     dizziness   Rosuvastatin     Muscle pain   Verapamil     Shortness of breath, swelling, palpations.    Allopurinol Other (See Comments)    dizziness   Penicillins Rash    Took for pneumonia when she was 23 and mader her mouth break out    Social History   Socioeconomic History   Marital status: Married    Spouse name: Not on file   Number of children: 0   Years of education: Not on file   Highest education level: Some college, no degree  Occupational History   Occupation: Retired  Tobacco Use   Smoking status: Never   Smokeless tobacco: Never  Vaping Use   Vaping status: Never Used  Substance and Sexual Activity   Alcohol use: Not Currently    Comment: 1 glass of wine seldom   Drug use: No   Sexual activity: Not on file  Other Topics Concern   Not on file  Social History Narrative   Not on file   Social Determinants of Health   Financial Resource Strain: Low Risk  (10/02/2020)   Overall Financial Resource Strain (CARDIA)    Difficulty of Paying Living Expenses: Not hard at all  Food Insecurity: No Food Insecurity (07/09/2023)   Hunger Vital Sign    Worried About Running Out of Food in the Last Year: Never true    Ran Out of Food in the Last Year: Never true  Transportation Needs: No Transportation Needs (07/09/2023)   PRAPARE - Administrator, Civil Service (Medical): No    Lack of Transportation (Non-Medical): No  Physical Activity: Inactive (10/02/2020)   Exercise Vital Sign    Days of Exercise per Week: 0 days    Minutes of Exercise per Session: 0 min  Stress: No Stress Concern Present (10/02/2020)   [employer] of Occupational Health - Occupational Stress Questionnaire    Feeling of Stress : Not at all  Social Connections:  Moderately Integrated (10/02/2020)   Social Connection and Isolation Panel [NHANES]    Frequency of Communication with Friends and Family: More than three times a week    Frequency of Social Gatherings with Friends and Family:  More than three times a week    Attends Religious Services: More than 4 times per year    Active Member of Clubs or Organizations: No    Attends Banker Meetings: Never    Marital Status: Married  Catering manager Violence: Not At Risk (07/09/2023)   Humiliation, Afraid, Rape, and Kick questionnaire    Fear of Current or Ex-Partner: No    Emotionally Abused: No    Physically Abused: No    Sexually Abused: No    Family History  Problem Relation Age of Onset   Stroke Mother        possible stroke   Heart Problems Father    Kidney cancer Sister    Bladder Cancer Brother     PHYSICAL EXAM: Vitals:   07/12/23 0822 07/12/23 1554  BP: (!) 157/58 (!) 131/56  Pulse: 79 91  Resp: 16 16  Temp: 97.7 F (36.5 C) 98.4 F (36.9 C)  SpO2: 95% 95%     Intake/Output Summary (Last 24 hours) at 07/12/2023 1706 Last data filed at 07/12/2023 1046 Gross per 24 hour  Intake 480 ml  Output 550 ml  Net -70 ml    General:  Well appearing. No respiratory difficulty HEENT: normal Neck: supple. no JVD. Carotids 2+ bilat; no bruits. No lymphadenopathy or thryomegaly appreciated. Cor: PMI nondisplaced. Regular rate & rhythm. No rubs, gallops or murmurs. Lungs: clear Abdomen: soft, nontender, nondistended. No hepatosplenomegaly. No bruits or masses. Good bowel sounds. Extremities: no cyanosis, clubbing, rash, edema Neuro: alert & oriented x 3, cranial nerves grossly intact. moves all 4 extremities w/o difficulty. Affect pleasant.  ECG: Normal sinus rhythm no acute changes  Results for orders placed or performed during the hospital encounter of 07/09/23 (from the past 24 hour(s))  Urinalysis, Routine w reflex microscopic -Urine, Clean Catch     Status:  Abnormal   Collection Time: 07/11/23  8:59 PM  Result Value Ref Range   Color, Urine AMBER (A) YELLOW   APPearance CLOUDY (A) CLEAR   Specific Gravity, Urine 1.017 1.005 - 1.030   pH 6.0 5.0 - 8.0   Glucose, UA NEGATIVE NEGATIVE mg/dL   Hgb urine dipstick MODERATE (A) NEGATIVE   Bilirubin Urine NEGATIVE NEGATIVE   Ketones, ur NEGATIVE NEGATIVE mg/dL   Protein, ur NEGATIVE NEGATIVE mg/dL   Nitrite NEGATIVE NEGATIVE   Leukocytes,Ua LARGE (A) NEGATIVE   RBC / HPF 11-20 0 - 5 RBC/hpf   WBC, UA 21-50 0 - 5 WBC/hpf   Bacteria, UA MANY (A) NONE SEEN   Squamous Epithelial / HPF 0-5 0 - 5 /HPF  TSH     Status: None   Collection Time: 07/12/23  4:29 AM  Result Value Ref Range   TSH 2.408 0.350 - 4.500 uIU/mL  Basic metabolic panel     Status: Abnormal   Collection Time: 07/12/23  4:29 AM  Result Value Ref Range   Sodium 139 135 - 145 mmol/L   Potassium 3.5 3.5 - 5.1 mmol/L   Chloride 105 98 - 111 mmol/L   CO2 23 22 - 32 mmol/L   Glucose, Bld 123 (H) 70 - 99 mg/dL   BUN 27 (H) 8 - 23 mg/dL   Creatinine, Ser 9.60 (H) 0.44 - 1.00 mg/dL   Calcium 8.4 (L) 8.9 - 10.3 mg/dL   GFR, Estimated 51 (L) >60 mL/min   Anion gap 11 5 - 15  CBC     Status: Abnormal   Collection Time: 07/12/23  4:29  AM  Result Value Ref Range   WBC 11.6 (H) 4.0 - 10.5 K/uL   RBC 2.82 (L) 3.87 - 5.11 MIL/uL   Hemoglobin 8.3 (L) 12.0 - 15.0 g/dL   HCT 40.9 (L) 81.1 - 91.4 %   MCV 93.3 80.0 - 100.0 fL   MCH 29.4 26.0 - 34.0 pg   MCHC 31.6 30.0 - 36.0 g/dL   RDW 78.2 (H) 95.6 - 21.3 %   Platelets 427 (H) 150 - 400 K/uL   nRBC 0.0 0.0 - 0.2 %   No results found.   ASSESSMENT AND PLAN: Preoperative clearance for endoscopy.  Since procedure is low risk and patient does not have any chest pain or acute heart failure at this time advise proceeding with endoscopy.  Thank you very much for referral  [facility]

## 2023-07-13 DIAGNOSIS — R262 Difficulty in walking, not elsewhere classified: Secondary | ICD-10-CM | POA: Diagnosis not present

## 2023-07-13 MED ORDER — OXYCODONE HCL 5 MG PO TABS: 10.0000 mg | ORAL_TABLET | ORAL | Status: DC | PRN

## 2023-07-13 MED ORDER — OXYCODONE-ACETAMINOPHEN 5-325 MG PO TABS: 1.0000 | ORAL_TABLET | ORAL | Status: DC | PRN

## 2023-07-13 MED ORDER — OXYCODONE HCL 5 MG PO TABS
10.0000 mg | ORAL_TABLET | Freq: Four times a day (QID) | ORAL | Status: DC | PRN
  Administered 2023-07-13: 10 mg via ORAL
  Filled 2023-07-13: qty 2

## 2023-07-13 MED ORDER — OXYCODONE HCL 5 MG PO TABS
5.0000 mg | ORAL_TABLET | ORAL | Status: DC | PRN
  Administered 2023-07-13 – 2023-07-16 (×6): 5 mg via ORAL
  Filled 2023-07-13 (×6): qty 1

## 2023-07-13 MED ORDER — CLONIDINE HCL 0.1 MG PO TABS
0.1000 mg | ORAL_TABLET | Freq: Every day | ORAL | Status: DC
  Administered 2023-07-13 – 2023-07-16 (×4): 0.1 mg via ORAL
  Filled 2023-07-13 (×4): qty 1

## 2023-07-13 NOTE — Progress Notes (Signed)
Progress Note   Patient: Kayla Chan ZOX:[id number] DOB: [birth year] DOA: 07/09/2023     2 DOS: the patient was seen and examined on 07/13/2023   Brief hospital course: Taken from H&P.   Kristinia Loveall is a 89 y.o. female with medical history significant of chronic ambulation dysfunction secondary to bilateral severe knee OA on roller walker at baseline, chronic HFrEF with LVEF 40-50%, CKD stage III, DVT on Xarelto, HTN, HLD, hypothyroidism, peptic ulcer on PPI, came from home for fall and right foot pain.    With severe bilateral knee OA, patient uses roller walker to ambulate at baseline for the last 3 years. She has unsteady gait at baseline. And 3 weeks ago she fell and twisted left ankle. Then she went to see urgent care and x-ray showed no fracture or dislocation and patient was given a cam boot and sent home.    On presentation vitals stable, chest x-ray with mild pulmonary congestion.  Labs with hemoglobin of 9.6, leukocytosis at 12.3, creatinine 1.1 and potassium of 3.5. DG right ankle and foot with comminuted and displaced fifth distal metatarsal shaft fracture, mildly displaced and angulated fourth metatarsal neck fracture and a nondisplaced fracture of distal third metatarsal neck.  And likely ligamental strain on ankle as there was no fracture noted.  Generalized soft tissue edema of foot or neck.   Patient was given ceftriaxone and Zithromax in ED. which were not continued.   Podiatry was consulted and they were recommending cam boot and outpatient follow-up in 4 weeks.  PT and OT evaluation ordered.  Patient will remain weightbearing as tolerated   11/16: Mildly elevated blood pressure, procalcitonin at 0.21, preliminary blood cultures negative in 24-hour.  Ordered Levaquin. PT is recommending SNF, TOC was consulted.   11/17: Vital stable, vitamin D levels low at 25-ordered replacement.  B12 borderline at 400, also started on supplement.  Potassium at 3.3 which is being  repleted.  Hemoglobin at 8.3, slowly decreasing, FOBT negative.  Ordered TSH with morning lab       Assessment and Plan: * Impaired ambulation Frequent fall. Right metatarsal fractures secondary to fall. Ankle sprain.   Patient with progressively worsening ambulation and history of falls.  Resulted in right mild to moderate displaced fourth and fifth metatarsal fractures with some ankle sprain.  Podiatry was consulted and they are recommending cam boot, weightbearing as tolerated and outpatient follow-up. PT is recommending SNF Discussed with [facility] manager .  Pending insurance authorization Continue with pain management   Personal history of venous thrombosis and embolism History of DVT s/p thrombectomy. -No acute concern Continue Xarelto   Leukocytosis Likely multifactorial with recent fall.  No upper respiratory symptoms, mildly positive procalcitonin at 0.21, preliminary blood cultures negative.  UA was ordered on admission but never obtained. -Continue to monitor   Essential hypertension Blood pressure elevated.  Patient was not taking home antihypertensives due to significant medication intolerance. According to patient's husband patient takes clonidine at home and will like hydralazine switched to clonidine   Hypothyroidism Continue home Synthroid -Checking TSH with a.m. lab due to persistent feeling lethargic and weak   Obesity, Class III, BMI 40-49.9 (morbid obesity) (HCC) Estimated body mass index is 43.49 kg/m as calculated from the following:   Height as of this encounter: 5' (1.524 m).   Weight as of this encounter: 101 kg.    -This will complicate overall prognosis   Chronic kidney disease, stage 3a (HCC) Creatinine seems to be within baseline. -Monitor renal function  Avoid nephrotoxin   Constipation Patient with history of chronic constipation. -Continue with as needed lactulose -Current daily with linzess     Subjective:  Patient seen and examined at  bedside this morning Admits to pain in the left foot Denies nausea vomiting chest pain   Physical Exam:   General.  Frail elderly lady, in no acute distress. Pulmonary.  Lungs clear bilaterally, normal respiratory effort. CV.  Regular rate and rhythm, no JVD, rub or murmur. Abdomen.  Soft, nontender, nondistended, BS positive. CNS.  Alert and oriented .  No focal neurologic deficit. Extremities.  No edema, no cyanosis, pulses intact and symmetrical.       Family Communication: Discussed with husband at bedside   Disposition: Pending skilled nursing facility placement   DVT prophylaxis.  Xarelto Time spent: 44 minutes   Data Reviewed:   Vitals:   07/12/23 1554 07/12/23 2136 07/13/23 0336 07/13/23 0814  BP: (!) 131/56 (!) 158/59 (!) 176/73 (!) 151/64  Pulse: 91 91 98 89  Resp: 16 16 18  (!) 21  Temp: 98.4 F (36.9 C) 98 F (36.7 C) 98.4 F (36.9 C) 98.6 F (37 C)  TempSrc: Oral  Oral Oral  SpO2: 95% 94% 98% 96%  Weight:      Height:          Latest Ref Rng & Units 07/12/2023    4:29 AM 07/11/2023    9:28 AM 07/11/2023    4:50 AM  CBC  WBC 4.0 - 10.5 K/uL 11.6  11.9  11.6   Hemoglobin 12.0 - 15.0 g/dL 8.3  8.3  7.9   Hematocrit 36.0 - 46.0 % 26.3  27.4  25.2   Platelets 150 - 400 K/uL 427  404  404        Latest Ref Rng & Units 07/12/2023    4:29 AM 07/11/2023    4:50 AM 07/09/2023   11:27 AM  BMP  Glucose 70 - 99 mg/dL 119  147  829   BUN 8 - 23 mg/dL 27  24  18    Creatinine 0.44 - 1.00 mg/dL 5.62  1.30  8.65   Sodium 135 - 145 mmol/L 139  135  135   Potassium 3.5 - 5.1 mmol/L 3.5  3.3  3.5   Chloride 98 - 111 mmol/L 105  101  104   CO2 22 - 32 mmol/L 23  24  20    Calcium 8.9 - 10.3 mg/dL 8.4  8.2  8.6     Author: Loyce Dys, MD 07/13/2023 2:40 PM  For on call review [email or web address].

## 2023-07-13 NOTE — TOC Progression Note (Addendum)
Transition of Care [facility]) - Progression Note    Patient Details  Name: Kayla Chan MRN: [id number] Date of Birth: 02/01/1932  Transition [facility]) CM/SW Contact  Hetty Ely, RN Phone Number: 07/13/2023, 9:28 AM  Clinical Narrative:  CM discussed SNF bed offers with Husband, who selected [facility]. CM called [facility] spoke with Tiffany, bed offer secured. HTA called to start the Auth. Process no answer left voice message. CM to continue to follow up with Insurance for Auth. 9:48 am: Received return call from H[employer] , authorization information given. HTA will call back once approved, covering CM, Sandi Carne contact information given. 2:35 pm. Called [employer]TA spoke with Judeth Cornfield, Authorization is still in pending status, MD notified.         Expected Discharge Plan and Services                                               Social Determinants of Health (SDOH) Interventions SDOH Screenings   Food Insecurity: No Food Insecurity (07/09/2023)  Housing: Low Risk  (07/09/2023)  Transportation Needs: No Transportation Needs (07/09/2023)  Utilities: Not At Risk (07/09/2023)  Alcohol Screen: Low Risk  (10/02/2020)  Depression (PHQ2-9): Low Risk  (01/08/2022)  Financial Resource Strain: Low Risk  (10/02/2020)  Physical Activity: Inactive (10/02/2020)  Social Connections: Moderately Integrated (10/02/2020)  Stress: No Stress Concern Present (10/02/2020)  Tobacco Use: Low Risk  (07/09/2023)    Readmission Risk Interventions     No data to display

## 2023-07-13 NOTE — Progress Notes (Signed)
Physical Therapy Treatment Patient Details Name: Kayla Chan MRN: [id number] DOB: [birth year] Today's Date: 07/13/2023   History of Present Illness Kayla Chan is a 89 y.o. female with medical history significant of chronic ambulation dysfunction secondary to bilateral severe knee OA on roller walker at baseline, chronic HFrEF with LVEF 40-50%, CKD stage III, DVT on Xarelto, HTN, HLD, hypothyroidism, peptic ulcer on PPI, came from home for fall and right foot pain. She was was found to have acute right fifth metatarsal fracture and subacute left ankle sprain, with recommendation for WBAT on R LE with CAM boot. She has a pre-existing CAM boot for L LE.    PT Comments  Pt seen for PT tx with pt agreeable with encouragement. Pt c/o dizziness & spouse reports is 2/2 taking pain meds, BP in LUE 155/67 mmHg MAP 92, HR 94 -- nurse made aware. Pt anxious re: pain & fearful of falling with movement. Pt requires max<>total assist +1 for supine<>sit but is able to sit EOB & engage in balance & BLE strengthening exercises. PT provides education re: benefits of participation in therapy. Will continue to follow pt acutely to address strengthening, balance, & endurance.    If plan is discharge home, recommend the following: Direct supervision/assist for financial management;Two people to help with walking and/or transfers;Two people to help with bathing/dressing/bathroom;Help with stairs or ramp for entrance;Assist for transportation;Assistance with cooking/housework   Can travel by private vehicle     No  Equipment Recommendations  Hoyer lift;Hospital bed;BSC/3in1;Wheelchair (measurements PT);Wheelchair cushion (measurements PT)    Recommendations for Other Services       Precautions / Restrictions Precautions Precautions: Fall Required Braces or Orthoses: Other Brace Other Brace: BLE cam boots Restrictions Weight Bearing Restrictions: Yes RLE Weight Bearing: Weight bearing as tolerated Other  Position/Activity Restrictions: BLE cam boots     Mobility  Bed Mobility Overal bed mobility: Needs Assistance Bed Mobility: Supine to Sit, Sit to Supine     Supine to sit: Max assist, Total assist, HOB elevated, Used rails Sit to supine: Max assist, HOB elevated, Used rails   General bed mobility comments: Pt very fearful of moving, fearful of falling, requires max<>total assist for supine>sit, max assist for sit>supine.    Transfers                        Ambulation/Gait                   Stairs             Wheelchair Mobility     Tilt Bed    Modified Rankin (Stroke Patients Only)       Balance Overall balance assessment: Needs assistance Sitting-balance support: Feet supported, Bilateral upper extremity supported, Single extremity supported Sitting balance-Leahy Scale: Fair                                      Cognition Arousal: Alert Behavior During Therapy: Anxious Overall Cognitive Status: Difficult to assess                                 General Comments: Pt very anxious re: pain, decreased understanding of purpose of therapy as pt states "I'm weak", fearful of standing, also limited by [facility].        Exercises General Exercises -  Lower Extremity Long Arc Quad: AROM, Seated, Strengthening, Both, 20 reps Other Exercises Other Exercises: Pt engaged in reaching outside of BOS with 1 UE support with focus on seated balance activities.    General Comments        Pertinent Vitals/Pain Pain Assessment Pain Assessment: Faces Faces Pain Scale: Hurts even more Pain Location: LLE Pain Descriptors / Indicators: Discomfort, Grimacing, Guarding Pain Intervention(s): Monitored during session, Limited activity within patient's tolerance, Premedicated before session, Utilized relaxation techniques (pt reports she was premedicated)    Home Living                          Prior Function             PT Goals (current goals can now be found in the care plan section) Acute Rehab PT Goals Patient Stated Goal: decreased pain PT Goal Formulation: With patient/family Time For Goal Achievement: 07/24/23 Potential to Achieve Goals: Fair Progress towards PT goals: Progressing toward goals    Frequency    Min 1X/week      PT Plan      Co-evaluation              AM-PAC PT "6 Clicks" Mobility   Outcome Measure  Help needed turning from your back to your side while in a flat bed without using bedrails?: A Lot Help needed moving from lying on your back to sitting on the side of a flat bed without using bedrails?: Total Help needed moving to and from a bed to a chair (including a wheelchair)?: Total Help needed standing up from a chair using your arms (e.g., wheelchair or bedside chair)?: Total Help needed to walk in hospital room?: Total Help needed climbing 3-5 steps with a railing? : Total 6 Click Score: 7    End of Session Equipment Utilized During Treatment:  (BLE cam boots) Activity Tolerance: Patient limited by pain (limited by c/o dizziness, fatigue) Patient left: in bed;with call bell/phone within reach;with bed alarm set Nurse Communication: Mobility status (pt's c/o dizziness, BP, family reporting pain meds have made pt dizzy) PT Visit Diagnosis: Muscle weakness (generalized) (M62.81);History of falling (Z91.81);Other abnormalities of gait and mobility (R26.89);Difficulty in walking, not elsewhere classified (R26.2);Pain Pain - Right/Left: Left Pain - part of body: Ankle and joints of foot     Time: 1346-1413 PT Time Calculation (min) (ACUTE ONLY): 27 min  Charges:    $Therapeutic Activity: 23-37 mins PT General Charges $$ ACUTE PT VISIT: 1 Visit                     Aleda Grana, PT, DPT 07/13/23, 2:35 PM   Sandi Mariscal 07/13/2023, 2:33 PM

## 2023-07-13 NOTE — Plan of Care (Signed)

## 2023-07-13 NOTE — Progress Notes (Signed)
Mobility Specialist - Progress Note   07/13/23 1427  Mobility  Activity Refused mobility     Pt declined mobility; no reason specified. Reports numbness in RUE and dizziness while supine. RN made aware. Will attempt another date/time.    Filiberto Pinks Mobility Specialist 07/13/23, 2:28 PM

## 2023-07-14 DIAGNOSIS — R262 Difficulty in walking, not elsewhere classified: Secondary | ICD-10-CM | POA: Diagnosis not present

## 2023-07-14 LAB — CULTURE, BLOOD (ROUTINE X 2)
Culture: NO GROWTH
Culture: NO GROWTH
Special Requests: ADEQUATE
Special Requests: ADEQUATE

## 2023-07-14 NOTE — Progress Notes (Signed)
Progress Note   Patient: Kayla Chan MWN:[id number] DOB: May 25, 1932 DOA: 07/09/2023     3 DOS: the patient was seen and examined on 07/14/2023    Brief hospital course: Taken from H&P.   "Kayla Chan is a 89 y.o. female with medical history significant of chronic ambulation dysfunction secondary to bilateral severe knee OA on roller walker at baseline, chronic HFrEF with LVEF 40-50%, CKD stage III, DVT on Xarelto, HTN, HLD, hypothyroidism, peptic ulcer on PPI, came from home for fall and right foot pain.    With severe bilateral knee OA, patient uses roller walker to ambulate at baseline for the last 3 years. She has unsteady gait at baseline. And 3 weeks ago she fell and twisted left ankle. Then she went to see urgent care and x-ray showed no fracture or dislocation and patient was given a cam boot and sent home.    On presentation vitals stable, chest x-ray with mild pulmonary congestion.  Labs with hemoglobin of 9.6, leukocytosis at 12.3, creatinine 1.1 and potassium of 3.5. DG right ankle and foot with comminuted and displaced fifth distal metatarsal shaft fracture, mildly displaced and angulated fourth metatarsal neck fracture and a nondisplaced fracture of distal third metatarsal neck.  And likely ligamental strain on ankle as there was no fracture noted.  Generalized soft tissue edema of foot or neck.   Patient was given ceftriaxone and Zithromax in ED. which were not continued.   Podiatry was consulted and they were recommending cam boot and outpatient follow-up in 4 weeks.  PT and OT evaluation ordered.  Patient will remain weightbearing as tolerated "      Assessment and Plan: * Impaired ambulation Frequent fall. Right metatarsal fractures secondary to fall. Left ankle sprain.   Patient with progressively worsening ambulation and history of falls.  Resulted in right mild to moderate displaced fourth and fifth metatarsal fractures with some ankle sprain.  Podiatry was  consulted and they are recommending cam boot, weightbearing as tolerated and outpatient follow-up. PT is recommending SNF Discussed with [facility] manager Pending insurance authorization Pain medication adjusted   Personal history of venous thrombosis and embolism History of DVT s/p thrombectomy. -No acute concern Continue Xarelto   Leukocytosis-improved Likely multifactorial with recent fall.  No upper respiratory symptoms, mildly positive procalcitonin at 0.21, preliminary blood cultures negative.  UA was ordered on admission but never obtained. -Continue to monitor   Essential hypertension Blood pressure elevated.  Patient was not taking home antihypertensives due to significant medication intolerance. According to patient's husband patient takes clonidine at home and will like hydralazine switched to clonidine, hydralazine have been discontinued  Hypothyroidism Continue Synthroid -Checking TSH with a.m. lab due to persistent feeling lethargic and weak   Obesity, Class III, BMI 40-49.9 (morbid obesity) (HCC) Estimated body mass index is 43.49 kg/m as calculated from the following:   Height as of this encounter: 5' (1.524 m).   Weight as of this encounter: 101 kg.     Chronic kidney disease, stage 3a (HCC) Creatinine seems to be within baseline. -Monitor renal function Avoid nephrotoxin   Constipation Patient with history of chronic constipation. -Continue with as needed lactulose Continue linzess     Subjective:  Patient seen and examined at bedside this morning Admits to pain involving the left ankle area however pain in the right foot is improving.  Her pain medications have been adjusted I called for peer to peer today as insurance said patient does not meet Medicare criteria for rehab placement  as patient is not participating in therapy.  PT to reevaluate patient again.   Physical Exam:   General.  Frail elderly lady, in no acute distress. Pulmonary.  Lungs clear  bilaterally, normal respiratory effort. CV.  Regular rate and rhythm, no JVD, rub or murmur. Abdomen.  Soft, nontender, nondistended, BS positive. CNS.  Alert and oriented .  No focal neurologic deficit. Extremities.  No edema, no cyanosis, pulses intact and symmetrical.       Family Communication: Discussed with husband at bedside   Disposition: Pending skilled nursing facility placement   DVT prophylaxis.  Xarelto Time spent: 42 minutes   Data Reviewed:      Latest Ref Rng & Units 07/12/2023    4:29 AM 07/11/2023    4:50 AM 07/09/2023   11:27 AM  BMP  Glucose 70 - 99 mg/dL 409  811  914   BUN 8 - 23 mg/dL 27  24  18    Creatinine 0.44 - 1.00 mg/dL 7.82  9.56  2.13   Sodium 135 - 145 mmol/L 139  135  135   Potassium 3.5 - 5.1 mmol/L 3.5  3.3  3.5   Chloride 98 - 111 mmol/L 105  101  104   CO2 22 - 32 mmol/L 23  24  20    Calcium 8.9 - 10.3 mg/dL 8.4  8.2  8.6        Latest Ref Rng & Units 07/12/2023    4:29 AM 07/11/2023    9:28 AM 07/11/2023    4:50 AM  CBC  WBC 4.0 - 10.5 K/uL 11.6  11.9  11.6   Hemoglobin 12.0 - 15.0 g/dL 8.3  8.3  7.9   Hematocrit 36.0 - 46.0 % 26.3  27.4  25.2   Platelets 150 - 400 K/uL 427  404  404       Vitals:   07/13/23 2136 07/14/23 0458 07/14/23 0853 07/14/23 1619  BP: (!) 150/58 (!) 158/56 (!) 128/59 (!) 160/77  Pulse: 91 93 80 93  Resp: 16 18 16 16   Temp: 98.9 F (37.2 C) 99.2 F (37.3 C) 99.2 F (37.3 C) 98.6 F (37 C)  TempSrc: Oral  Oral Oral  SpO2: 93% 93% 94% 94%  Weight:      Height:         Author: Loyce Dys, MD 07/14/2023 6:25 PM  For on call review [email or web address].

## 2023-07-14 NOTE — Care Management Important Message (Signed)
Important Message  Patient Details  Name: Kayla Chan MRN: [id number] Date of Birth: 11/24/1931   Important Message Given:  N/A - LOS <3 / Initial given by admissions     Kayla Chan 07/14/2023, 10:35 AM

## 2023-07-14 NOTE — TOC Progression Note (Addendum)
Transition of Care [facility]) - Progression Note    Patient Details  Name: Kayla Chan MRN: [id number] Date of Birth: [birth year]  [facility] CM/SW Contact  Margarito Liner, LCSW Phone Number: 07/14/2023, 10:00 AM  Clinical Narrative:  Left voicemail for [employer]. Will check SNF auth status when they call back.   10:49 am: Auth still pending.  1:30 pm: Auth still pending. [facility] does not have a bed today but will have one tomorrow.  2:51 pm: Insurance is offering a peer-to-peer review. CSW sent information to MD. Erenest Blank is tomorrow at 12:00 pm.  Expected Discharge Plan and Services         Expected Discharge Date: 07/13/23                                     Social Determinants of Health (SDOH) Interventions SDOH Screenings   Food Insecurity: No Food Insecurity (07/09/2023)  Housing: Low Risk  (07/09/2023)  Transportation Needs: No Transportation Needs (07/09/2023)  Utilities: Not At Risk (07/09/2023)  Alcohol Screen: Low Risk  (10/02/2020)  Depression (PHQ2-9): Low Risk  (01/08/2022)  Financial Resource Strain: Low Risk  (10/02/2020)  Physical Activity: Inactive (10/02/2020)  Social Connections: Moderately Integrated (10/02/2020)  Stress: No Stress Concern Present (10/02/2020)  Tobacco Use: Low Risk  (07/09/2023)    Readmission Risk Interventions     No data to display

## 2023-07-14 NOTE — Progress Notes (Signed)
Occupational Therapy Treatment Patient Details Name: Kaleea Casella MRN: [id number] DOB: 11-Jun-1932 Today's Date: 07/14/2023   History of present illness Javae Kindler is a 89 y.o. female with medical history significant of chronic ambulation dysfunction secondary to bilateral severe knee OA on roller walker at baseline, chronic HFrEF with LVEF 40-50%, CKD stage III, DVT on Xarelto, HTN, HLD, hypothyroidism, peptic ulcer on PPI, came from home for fall and right foot pain. She was was found to have acute right fifth metatarsal fracture and subacute left ankle sprain, with recommendation for WBAT on R LE with CAM boot. She has a pre-existing CAM boot for L LE.   OT comments  Ms. Fizer was seen for OT treatment on this date. Upon arrival to room pt supine in bed with spouse at bedside, agreeable to OT Tx session. OT facilitated ADL management with education and assist as described below. See ADL section for additional details regarding occupational performance. Pt continues to be functionally limited by generalized weakness, decreased functional use of BLE, and increased pain with mobility. Pt return verbalizes understanding of education provided t/o session. Pt is progressing toward OT goals and continues to benefit from skilled OT services to maximize return to PLOF and minimize risk of future falls, injury, caregiver burden, and readmission. Will continue to follow POC as written. Discharge recommendation remains appropriate.        If plan is discharge home, recommend the following:  Two people to help with walking and/or transfers;Assistance with cooking/housework;Assist for transportation;Help with stairs or ramp for entrance;Two people to help with bathing/dressing/bathroom;Direct supervision/assist for medications management;Direct supervision/assist for financial management   Equipment Recommendations   (defer to next venue of care)    Recommendations for Other Services      Precautions  / Restrictions Precautions Precautions: Fall Required Braces or Orthoses: Other Brace Other Brace: BLE cam boots Restrictions Weight Bearing Restrictions: Yes RLE Weight Bearing: Weight bearing as tolerated Other Position/Activity Restrictions: BLE cam boots       Mobility Bed Mobility Overal bed mobility: Needs Assistance Bed Mobility: Rolling Rolling: Mod assist, Used rails              Transfers                   General transfer comment: deferred for pt safety.     Balance Overall balance assessment: Needs assistance Sitting-balance support: Bilateral upper extremity supported, Feet supported Sitting balance-Leahy Scale: Fair                                     ADL either performed or assessed with clinical judgement   ADL Overall ADL's : Needs assistance/impaired     Grooming: Sitting;Brushing hair;Wash/dry face;Supervision/safety;Set up;Bed level Grooming Details (indicate cue type and reason): Pt performs full AM routine including UB wash up, face washing, aplying lotion, brushing hair and applying makeup whil seated upright in bed with bed in chair position. Pt is able to come to long-sitting without back support using bed rails intermittently during session. She is eager to perform fucntional tasks as independently as possible and requires SET UP/SUPERVISION assist with min cueing for use of compensatory strategies and energy conservation techniques during session. She return demos understanding of education provided. Upper Body Bathing: Sitting;Bed level;Moderate assistance Upper Body Bathing Details (indicate cue type and reason): MOD A to wash back with bed in chair position. Pt is able to shift  R<>L and holds position independently while OT assists with wash up this am.                                Extremity/Trunk Assessment              Vision Baseline Vision/History: 4 Cataracts Patient Visual Report: No change  from baseline     Perception     Praxis      Cognition Arousal: Alert Behavior During Therapy: WFL for tasks assessed/performed Overall Cognitive Status: Within Functional Limits for tasks assessed                                 General Comments: Pt anxious about any mobility/movement but is able to participate in bed level activity. HOH but does don hearing aids during session which improves participation. Generally oriented to self, situation, and place. Requires intermittent additional cueing to complete tasks.        Exercises Other Exercises Other Exercises: OT facilitated ADL management with education and assist as described above. Pt demos good recall of education provided.    Shoulder Instructions       General Comments      Pertinent Vitals/ Pain       Pain Assessment Pain Assessment: Faces Faces Pain Scale: Hurts even more Pain Location: LLE pain, LBP with positional changes. Pain Descriptors / Indicators: Discomfort, Grimacing, Guarding Pain Intervention(s): Limited activity within patient's tolerance, Monitored during session, Repositioned, Utilized relaxation techniques  Home Living                                          Prior Functioning/Environment              Frequency  Min 1X/week        Progress Toward Goals  OT Goals(current goals can now be found in the care plan section)     Acute Rehab OT Goals Patient Stated Goal: To be as independent as possible. OT Goal Formulation: With patient/family Time For Goal Achievement: 07/24/23 Potential to Achieve Goals: Fair  Plan      Co-evaluation                 AM-PAC OT "6 Clicks" Daily Activity     Outcome Measure   Help from another person eating meals?: A Little Help from another person taking care of personal grooming?: A Little Help from another person toileting, which includes using toliet, bedpan, or urinal?: Total Help from another  person bathing (including washing, rinsing, drying)?: Total Help from another person to put on and taking off regular upper body clothing?: A Lot Help from another person to put on and taking off regular lower body clothing?: Total 6 Click Score: 11    End of Session Equipment Utilized During Treatment: Gait belt;Rolling walker (2 wheels);Other (comment)  OT Visit Diagnosis: Other abnormalities of gait and mobility (R26.89);Repeated falls (R29.6);Muscle weakness (generalized) (M62.81);Pain Pain - Right/Left: Right Pain - part of body: Ankle and joints of foot   Activity Tolerance Patient limited by pain   Patient Left in bed;with call bell/phone within reach;with bed alarm set;with family/visitor present   Nurse Communication          Time: 4166-0630 OT Time Calculation (min): 28 min  Charges: OT General Charges $OT Visit: 1 Visit OT Treatments $Self Care/Home Management : 23-37 mins  Rockney Ghee, M.S., OTR/L 07/14/23, 1:53 PM

## 2023-07-15 DIAGNOSIS — R262 Difficulty in walking, not elsewhere classified: Secondary | ICD-10-CM | POA: Diagnosis not present

## 2023-07-15 LAB — BASIC METABOLIC PANEL
Anion gap: 9 (ref 5–15)
BUN: 22 mg/dL (ref 8–23)
CO2: 24 mmol/L (ref 22–32)
Calcium: 8 mg/dL — ABNORMAL LOW (ref 8.9–10.3)
Chloride: 103 mmol/L (ref 98–111)
Creatinine, Ser: 0.99 mg/dL (ref 0.44–1.00)
GFR, Estimated: 55 mL/min — ABNORMAL LOW (ref >60)
Glucose, Bld: 122 mg/dL — ABNORMAL HIGH (ref 70–99)
Potassium: 3.4 mmol/L — ABNORMAL LOW (ref 3.5–5.1)
Sodium: 136 mmol/L (ref 135–145)

## 2023-07-15 LAB — CBC WITH DIFFERENTIAL/PLATELET
Abs Immature Granulocytes: 0.05 10*3/uL (ref 0.00–0.07)
Basophils Absolute: 0 10*3/uL (ref 0.0–0.1)
Basophils Relative: 0 %
Eosinophils Absolute: 0.4 10*3/uL (ref 0.0–0.5)
Eosinophils Relative: 4 %
HCT: 25.7 % — ABNORMAL LOW (ref 36.0–46.0)
Hemoglobin: 7.9 g/dL — ABNORMAL LOW (ref 12.0–15.0)
Immature Granulocytes: 1 %
Lymphocytes Relative: 19 %
Lymphs Abs: 1.7 10*3/uL (ref 0.7–4.0)
MCH: 28 pg (ref 26.0–34.0)
MCHC: 30.7 g/dL (ref 30.0–36.0)
MCV: 91.1 fL (ref 80.0–100.0)
Monocytes Absolute: 1.1 10*3/uL — ABNORMAL HIGH (ref 0.1–1.0)
Monocytes Relative: 12 %
Neutro Abs: 5.7 10*3/uL (ref 1.7–7.7)
Neutrophils Relative %: 64 %
Platelets: 513 10*3/uL — ABNORMAL HIGH (ref 150–400)
RBC: 2.82 MIL/uL — ABNORMAL LOW (ref 3.87–5.11)
RDW: 15.9 % — ABNORMAL HIGH (ref 11.5–15.5)
WBC: 9 10*3/uL (ref 4.0–10.5)
nRBC: 0 % (ref 0.0–0.2)

## 2023-07-15 MED ORDER — VITAMIN D 25 MCG (1000 UNIT) PO TABS
1000.0000 [IU] | ORAL_TABLET | Freq: Every day | ORAL | Status: DC
  Administered 2023-07-15 – 2023-07-16 (×2): 1000 [IU] via ORAL
  Filled 2023-07-15: qty 1

## 2023-07-15 MED ORDER — POTASSIUM CHLORIDE CRYS ER 20 MEQ PO TBCR
20.0000 meq | EXTENDED_RELEASE_TABLET | Freq: Once | ORAL | Status: AC
  Administered 2023-07-15: 20 meq via ORAL
  Filled 2023-07-15: qty 1

## 2023-07-15 NOTE — Progress Notes (Signed)
Physical Therapy Treatment Patient Details Name: Kayla Chan MRN: [id number] DOB: March 08, 1932 Today's Date: 07/15/2023   History of Present Illness Kayla Chan is a 89 y.o. female with medical history significant of chronic ambulation dysfunction secondary to bilateral severe knee OA on roller walker at baseline, chronic HFrEF with LVEF 40-50%, CKD stage III, DVT on Xarelto, HTN, HLD, hypothyroidism, peptic ulcer on PPI, came from home for fall and right foot pain. She was was found to have acute right fifth metatarsal fracture and subacute left ankle sprain, with recommendation for WBAT on R LE with CAM boot. She has a pre-existing CAM boot for L LE.    PT Comments  PT/OT co-treat to maximize pt/staff safety and due to pt's minimal activity tolerance. Pt is alert and cooperative but throughout session presents with severe anxiety. Pt is HOH. She was able to progress from long sitting to short sitting EOB with +2 assistance. Sat EOB x several minutes prior to standing 2 x. On third STS, pt took a few steps to recliner from EOB with slow cautious step-to steps. Pt is fair form her baseline abilities. Highly recommend continued skilled PT at DC to maximize her independence and safety with all ADLs.  DC recs remin appropriate. Acute PT will continue to follow.    If plan is discharge home, recommend the following: Direct supervision/assist for financial management;Two people to help with walking and/or transfers;Two people to help with bathing/dressing/bathroom;Help with stairs or ramp for entrance;Assist for transportation;Assistance with cooking/housework     Equipment Recommendations  Other (comment) (defer to next level of care)       Precautions / Restrictions Precautions Precautions: Fall Required Braces or Orthoses: Other Brace Other Brace: BLE cam boots (per MD can be removed when not in wt bearing) Restrictions Weight Bearing Restrictions: Yes RLE Weight Bearing: Weight bearing as  tolerated Other Position/Activity Restrictions: BLE cam boots     Mobility  Bed Mobility Overal bed mobility: Needs Assistance Bed Mobility: Supine to Sit  Supine to sit: Max assist, +2 for physical assistance  General bed mobility comments: pt requires extensive assistance to achieve EOB sitting but is able to perform. most vcs for relaxation/decrease anxiety. Pt's anxiety greatly limits session progression    Transfers Overall transfer level: Needs assistance Equipment used: Rolling walker (2 wheels) Transfers: Sit to/from Stand Sit to Stand: +2 physical assistance, From elevated surface, Min assist, Contact guard assist  General transfer comment: .Pt stood 3 x EOB. first STS +2 min-mod. 2nd attempt =2 min. 3rd. pt able to progress to +2 CGA for safety during session.    Ambulation/Gait Ambulation/Gait assistance: +2 safety/equipment, +2 physical assistance, Min assist Gait Distance (Feet): 3 Feet Assistive device: Rolling walker (2 wheels) Gait Pattern/deviations: Step-to pattern Gait velocity: decreased  General Gait Details: Pt was able to take ~ 3 steps from EOB to recliner with max vcs for relaxation and sequencing. Assited with RW progression.   Balance Overall balance assessment: Needs assistance Sitting-balance support: Feet supported, No upper extremity supported Sitting balance-Leahy Scale: Good Sitting balance - Comments: steady static sitting at EOB without BUE support.   Standing balance support: Reliant on assistive device for balance, During functional activity, Bilateral upper extremity supported Standing balance-Leahy Scale: Fair Standing balance comment: very fearful of falling, balance also limited by bilat CAM boots. Benefits from +2 for safety.       Cognition Arousal: Alert Behavior During Therapy: WFL for tasks assessed/performed Overall Cognitive Status: Within Functional Limits for tasks assessed  General Comments: Pt anxious about any  mobility/movement but is able to participate in bed level activity. HOH but does don hearing aids during session which improves participation. Generally oriented to self, situation, and place. Requires intermittent additional cueing to complete tasks.               Pertinent Vitals/Pain Pain Assessment Pain Assessment: Faces Faces Pain Scale: Hurts even more Pain Location: LLE pain, LBP with positional changes. Pain Descriptors / Indicators: Discomfort, Grimacing, Guarding Pain Intervention(s): Limited activity within patient's tolerance, Monitored during session, Premedicated before session, Repositioned     PT Goals (current goals can now be found in the care plan section) Acute Rehab PT Goals Patient Stated Goal: none stated Progress towards PT goals: Progressing toward goals    Frequency    Min 1X/week       Co-evaluation   Reason for Co-Treatment: Complexity of the patient's impairments (multi-system involvement);Necessary to address cognition/behavior during functional activity;For patient/therapist safety;To address functional/ADL transfers PT goals addressed during session: Mobility/safety with mobility;Strengthening/ROM;Balance;Proper use of DME OT goals addressed during session: ADL's and self-care;Proper use of Adaptive equipment and DME      AM-PAC PT "6 Clicks" Mobility   Outcome Measure  Help needed turning from your back to your side while in a flat bed without using bedrails?: A Lot Help needed moving from lying on your back to sitting on the side of a flat bed without using bedrails?: Total Help needed moving to and from a bed to a chair (including a wheelchair)?: Total Help needed standing up from a chair using your arms (e.g., wheelchair or bedside chair)?: Total Help needed to walk in hospital room?: Total Help needed climbing 3-5 steps with a railing? : Total 6 Click Score: 7    End of Session Equipment Utilized During Treatment: Other (comment)  (bilateral CAM boots) Activity Tolerance: Patient tolerated treatment well;Other (comment) (limited by anxiety/ fear of falling) Patient left: in chair;with call bell/phone within reach;with chair alarm set;with family/visitor present Nurse Communication: Mobility status PT Visit Diagnosis: Muscle weakness (generalized) (M62.81);History of falling (Z91.81);Other abnormalities of gait and mobility (R26.89);Difficulty in walking, not elsewhere classified (R26.2);Pain Pain - Right/Left: Left Pain - part of body: Ankle and joints of foot     Time: 1001-1025 PT Time Calculation (min) (ACUTE ONLY): 24 min  Charges:    $Therapeutic Activity: 8-22 mins PT General Charges $$ ACUTE PT VISIT: 1 Visit                    Jetta Lout PTA 07/15/23, 12:02 PM

## 2023-07-15 NOTE — Progress Notes (Signed)
[facility]  - [facility]   PATIENT NAME: Kayla Chan    MR#:  [id number]  DATE OF BIRTH:  1931-10-12  SUBJECTIVE:   Worked with PT and now sitting in the chair. Overall improved working with therapy. Family at bedside.   VITALS:  Blood pressure (!) 158/71, pulse 91, temperature 98.5 F (36.9 C), temperature source Oral, resp. rate 17, height 5' (1.524 m), weight 101 kg, SpO2 98%.  PHYSICAL EXAMINATION:   GENERAL:  89 y.o.-year-old patient with no acute distress.  LUNGS: Normal breath sounds bilaterally, no wheezing CARDIOVASCULAR: S1, S2 normal. No murmur   ABDOMEN: Soft, nontender, nondistended. Bowel sounds present.  EXTREMITIES: right foot cam boot+  NEUROLOGIC: nonfocal  patient is alert and awake SKIN: No obvious rash, lesion, or ulcer.   LABORATORY PANEL:  CBC Recent Labs  Lab 07/15/23 0408  WBC 9.0  HGB 7.9*  HCT 25.7*  PLT 513*    Chemistries  Recent Labs  Lab 07/09/23 1127 07/11/23 0450 07/15/23 0408  NA 135   < > 136  K 3.5   < > 3.4*  CL 104   < > 103  CO2 20*   < > 24  GLUCOSE 141*   < > 122*  BUN 18   < > 22  CREATININE 1.11*   < > 0.99  CALCIUM 8.6*   < > 8.0*  AST 19  --   --   ALT 10  --   --   ALKPHOS 86  --   --   BILITOT 0.8  --   --    < > = values in this interval not displayed.   Assessment and Plan  Kayla Chan is a 89 y.o. female with medical history significant of chronic ambulation dysfunction secondary to bilateral severe knee OA on roller walker at baseline, chronic HFrEF with LVEF 40-50%, CKD stage III, DVT on Xarelto, HTN, HLD, hypothyroidism, peptic ulcer on PPI, came from home for fall and right foot pain   DG right ankle and foot with comminuted and displaced fifth distal metatarsal shaft fracture, mildly displaced and angulated fourth metatarsal neck fracture and a nondisplaced fracture of distal third metatarsal neck. And likely ligamental strain on ankle as there was no fracture noted.    Podiatry was consulted and they were recommending cam boot and outpatient follow-up in 4 weeks.  PT and OT evaluation ordered.  Patient will remain weightbearing as tolerated   Impaired ambulation/Frequent fall. Right metatarsal fractures secondary to fall. Left ankle sprain.  --Patient with progressively worsening ambulation and history of falls.  Resulted in right mild to moderate displaced fourth and fifth metatarsal fractures with some ankle sprain.  -- Podiatry was consulted and they are recommending cam boot, weightbearing as tolerated and outpatient follow-up. --PT is recommending SNF --Pain medication adjusted   Personal history of venous thrombosis and embolism History of DVT s/p thrombectomy. -No acute concern -Continue Xarelto   Leukocytosis-improved -Likely multifactorial with recent fall.  No upper respiratory symptoms, mildly positive procalcitonin at 0.21, preliminary blood cultures negative.     Essential hypertension --on clonidine   Hypothyroidism Continue Synthroid  Obesity, Class III, BMI 40-49.9 (morbid obesity) (HCC) Estimated body mass index is 43.49 kg/m as calculated from the following:      Chronic kidney disease, stage 3a (HCC) Creatinine seems to be within baseline. Avoid nephrotoxin   Constipation -Patient with history of chronic constipation. -Continue with as needed lactulose -Continue linzess   patient  will discharge to rehab once insurance approval obtained    Procedures: none Family communication : family in the room Consults : podiatry CODE STATUS: full DVT Prophylaxis : Xarelto Level of care: Med-Surg Status is: Inpatient Remains inpatient appropriate because: patient medically best at baseline for discharge once insurance auto obtain    TOTAL TIME TAKING CARE OF THIS PATIENT: 35 minutes.  >50% time spent on counselling and coordination of care  Note: This dictation was prepared with Dragon dictation along with smaller  phrase technology. Any transcriptional errors that result from this process are unintentional.  Enedina Finner M.D    [facility]   CC: Primary care physician; Mick Sell, MD

## 2023-07-15 NOTE — Progress Notes (Signed)
Occupational Therapy Treatment Patient Details Name: Kayla Chan MRN: [id number] DOB: [birth year] Today's Date: 07/15/2023   History of present illness Kayla Chan is a 89 y.o. female with medical history significant of chronic ambulation dysfunction secondary to bilateral severe knee OA on roller walker at baseline, chronic HFrEF with LVEF 40-50%, CKD stage III, DVT on Xarelto, HTN, HLD, hypothyroidism, peptic ulcer on PPI, came from home for fall and right foot pain. She was was found to have acute right fifth metatarsal fracture and subacute left ankle sprain, with recommendation for WBAT on R LE with CAM boot. She has a pre-existing CAM boot for L LE.   OT comments  Pt seen for OT/PT co- treatment on this date to progress mobility and maximize pt safety. Upon arrival to room pt supine in bed, agreeable to Tx session with gentle encouragement for participation in transfer training. Supportive spouse at bedside. OT/PTA facilitated ADL management as described below. See ADL section for additional details regarding occupational performance. Pt continues to be functionally limited by generalized weakness, increased LLE pain, and decreased balance. She requires +2 MIN A to perform SPT to recliner, MOD A for seated UB ADL management at EOB. Pt return verbalizes understanding of education provided t/o session. Pt is progressing toward OT goals and continues to benefit from skilled OT services to maximize return to PLOF and minimize risk of future falls, injury, caregiver burden, and readmission. Will continue to follow POC as written. Discharge recommendation remains appropriate.        If plan is discharge home, recommend the following:  Two people to help with walking and/or transfers;Assistance with cooking/housework;Assist for transportation;Help with stairs or ramp for entrance;Two people to help with bathing/dressing/bathroom;Direct supervision/assist for medications management;Direct  supervision/assist for financial management   Equipment Recommendations  Other (comment) (defer)    Recommendations for Other Services      Precautions / Restrictions Precautions Precautions: Fall Required Braces or Orthoses: Other Brace Other Brace: BLE cam boots Restrictions Weight Bearing Restrictions: Yes RLE Weight Bearing: Weight bearing as tolerated Other Position/Activity Restrictions: BLE cam boots       Mobility Bed Mobility Overal bed mobility: Needs Assistance Bed Mobility: Supine to Sit     Supine to sit: Max assist, +2 for physical assistance          Transfers Overall transfer level: Needs assistance Equipment used: Rolling walker (2 wheels) Transfers: Sit to/from Stand Sit to Stand: +2 physical assistance, From elevated surface, Min assist, Contact guard assist           General transfer comment: pt able to progress to +2 CGA for safety during session.     Balance Overall balance assessment: Needs assistance Sitting-balance support: Feet supported, No upper extremity supported Sitting balance-Leahy Scale: Good Sitting balance - Comments: steady static sitting at EOB without BUE support.   Standing balance support: Reliant on assistive device for balance, During functional activity, Bilateral upper extremity supported Standing balance-Leahy Scale: Fair Standing balance comment: very fearful of falling, balance also limited by bilat CAM boots. Benefits from +2 for safety.                           ADL either performed or assessed with clinical judgement   ADL Overall ADL's : Needs assistance/impaired         Upper Body Bathing: Sitting;Moderate assistance Upper Body Bathing Details (indicate cue type and reason): MOD A to wash back while sitting EOB.  Toilet Transfer: +2 for physical assistance;Set up;Supervision/safety;BSC/3in1;Rolling walker (2 wheels);Minimal assistance Toilet Transfer Details (indicate cue  type and reason): Simulated STS from elevated EOB to recliner. able to take 3-4 small pivoting steps to recliner with encouragament and initial +2 MIN A for STS         Functional mobility during ADLs: Minimal assistance;+2 for physical assistance;Rolling walker (2 wheels);Cueing for safety;Cueing for sequencing General ADL Comments: Pt significantly anxious re: functional mobility and endorses significant fear of falling. She requires increased encouragement t/o session but ultimately demos good standing tolerance and is able to take 3-4 pivoting steps to recliner with +2 assist for safety and MIN A.    Extremity/Trunk Assessment              Vision Baseline Vision/History: 4 Cataracts Patient Visual Report: No change from baseline     Perception     Praxis      Cognition Arousal: Alert Behavior During Therapy: WFL for tasks assessed/performed Overall Cognitive Status: Within Functional Limits for tasks assessed                                 General Comments: Pt anxious about any mobility/movement but is able to participate in bed level activity. HOH but does don hearing aids during session which improves participation. Generally oriented to self, situation, and place. Requires intermittent additional cueing to complete tasks.        Exercises Other Exercises Other Exercises: OT facilitated ADL management with education and assist as described above. Pt demos good recall of education provided.    Shoulder Instructions       General Comments      Pertinent Vitals/ Pain       Pain Assessment Pain Assessment: Faces Faces Pain Scale: Hurts even more Pain Location: LLE pain, LBP with positional changes. Pain Descriptors / Indicators: Discomfort, Grimacing, Guarding Pain Intervention(s): Limited activity within patient's tolerance, Monitored during session, Premedicated before session  Home Living                                           Prior Functioning/Environment              Frequency  Min 1X/week        Progress Toward Goals  OT Goals(current goals can now be found in the care plan section)  Progress towards OT goals: Progressing toward goals  Acute Rehab OT Goals Patient Stated Goal: To be as independent as possible OT Goal Formulation: With patient/family Time For Goal Achievement: 07/24/23 Potential to Achieve Goals: Fair  Plan      Co-evaluation    PT/OT/SLP Co-Evaluation/Treatment: Yes Reason for Co-Treatment: Complexity of the patient's impairments (multi-system involvement);Necessary to address cognition/behavior during functional activity;For patient/therapist safety;To address functional/ADL transfers PT goals addressed during session: Mobility/safety with mobility;Strengthening/ROM;Balance;Proper use of DME OT goals addressed during session: ADL's and self-care;Proper use of Adaptive equipment and DME      AM-PAC OT "6 Clicks" Daily Activity     Outcome Measure   Help from another person eating meals?: A Little Help from another person taking care of personal grooming?: A Little Help from another person toileting, which includes using toliet, bedpan, or urinal?: Total Help from another person bathing (including washing, rinsing, drying)?: Total Help from another person to put on and  taking off regular upper body clothing?: A Lot Help from another person to put on and taking off regular lower body clothing?: Total 6 Click Score: 11    End of Session Equipment Utilized During Treatment: Gait belt;Rolling walker (2 wheels);Other (comment)  OT Visit Diagnosis: Other abnormalities of gait and mobility (R26.89);Repeated falls (R29.6);Muscle weakness (generalized) (M62.81);Pain Pain - Right/Left: Right Pain - part of body: Ankle and joints of foot   Activity Tolerance Patient tolerated treatment well   Patient Left in bed;with call bell/phone within reach;with bed alarm set;with  family/visitor present   Nurse Communication Mobility status        Time: 1001-1025 OT Time Calculation (min): 24 min  Charges: OT General Charges $OT Visit: 1 Visit OT Evaluation $OT Eval Moderate Complexity: 1 Mod OT Treatments $Self Care/Home Management : 8-22 mins  Rockney Ghee, M.S., OTR/L 07/15/23, 11:54 AM

## 2023-07-15 NOTE — TOC Progression Note (Addendum)
Transition of Care [facility]) - Progression Note    Patient Details  Name: Kayla Chan MRN: [id number] Date of Birth: 02/17/1932  [facility] CM/SW Contact  Margarito Liner, LCSW Phone Number: 07/15/2023, 12:06 PM  Clinical Narrative:   CSW left voicemail for Tammy at [facility] to let her know that today's PT and OT notes are in.  12:11 pm: Tammy called back and asked CSW to call Melrose with [facility]. CSW called and left her a voicemail.  1:11 pm: CSW spoke to Pearcy at [facility] who confirmed she received voicemail.  Expected Discharge Plan: Skilled Nursing Facility    Expected Discharge Plan and Services         Expected Discharge Date: 07/14/23                                     Social Determinants of Health (SDOH) Interventions SDOH Screenings   Food Insecurity: No Food Insecurity (07/09/2023)  Housing: Low Risk  (07/09/2023)  Transportation Needs: No Transportation Needs (07/09/2023)  Utilities: Not At Risk (07/09/2023)  Alcohol Screen: Low Risk  (10/02/2020)  Depression (PHQ2-9): Low Risk  (01/08/2022)  Financial Resource Strain: Low Risk  (10/02/2020)  Physical Activity: Inactive (10/02/2020)  Social Connections: Moderately Integrated (10/02/2020)  Stress: No Stress Concern Present (10/02/2020)  Tobacco Use: Low Risk  (07/09/2023)    Readmission Risk Interventions     No data to display

## 2023-07-15 NOTE — Progress Notes (Signed)
Mobility Specialist - Progress Note    07/15/23 1616  Mobility  Activity Stood at bedside;Transferred from chair to bed  Level of Assistance +2 (takes two people)  Assistive Device None  Range of Motion/Exercises Active  RLE Weight Bearing WBAT  Activity Response Tolerated well  Mobility Referral Yes  $Mobility charge 1 Mobility   Pt resting in recliner on RA upon entry. Pt boot donned by RN. RN present at bedside. Pt transferred to bed from recliner +2 Mod/MaxA with no AD. Pt left in bed with needs in reach and bed alarm activated.    Johnathan Hausen Mobility Specialist 07/15/23, 5:30 PM

## 2023-07-16 DIAGNOSIS — M170 Bilateral primary osteoarthritis of knee: Secondary | ICD-10-CM | POA: Insufficient documentation

## 2023-07-16 DIAGNOSIS — I82409 Acute embolism and thrombosis of unspecified deep veins of unspecified lower extremity: Secondary | ICD-10-CM

## 2023-07-16 DIAGNOSIS — N182 Chronic kidney disease, stage 2 (mild): Secondary | ICD-10-CM | POA: Diagnosis not present

## 2023-07-16 DIAGNOSIS — E785 Hyperlipidemia, unspecified: Secondary | ICD-10-CM | POA: Diagnosis not present

## 2023-07-16 DIAGNOSIS — N390 Urinary tract infection, site not specified: Secondary | ICD-10-CM | POA: Diagnosis not present

## 2023-07-16 DIAGNOSIS — W19XXXD Unspecified fall, subsequent encounter: Secondary | ICD-10-CM | POA: Insufficient documentation

## 2023-07-16 DIAGNOSIS — I10 Essential (primary) hypertension: Secondary | ICD-10-CM | POA: Diagnosis not present

## 2023-07-16 DIAGNOSIS — S92334D Nondisplaced fracture of third metatarsal bone, right foot, subsequent encounter for fracture with routine healing: Secondary | ICD-10-CM | POA: Diagnosis not present

## 2023-07-16 DIAGNOSIS — I129 Hypertensive chronic kidney disease with stage 1 through stage 4 chronic kidney disease, or unspecified chronic kidney disease: Secondary | ICD-10-CM | POA: Diagnosis not present

## 2023-07-16 DIAGNOSIS — Z86718 Personal history of other venous thrombosis and embolism: Secondary | ICD-10-CM | POA: Diagnosis not present

## 2023-07-16 DIAGNOSIS — R29898 Other symptoms and signs involving the musculoskeletal system: Secondary | ICD-10-CM | POA: Diagnosis not present

## 2023-07-16 DIAGNOSIS — D72829 Elevated white blood cell count, unspecified: Secondary | ICD-10-CM | POA: Diagnosis not present

## 2023-07-16 DIAGNOSIS — S92351D Displaced fracture of fifth metatarsal bone, right foot, subsequent encounter for fracture with routine healing: Secondary | ICD-10-CM

## 2023-07-16 DIAGNOSIS — K219 Gastro-esophageal reflux disease without esophagitis: Secondary | ICD-10-CM | POA: Insufficient documentation

## 2023-07-16 DIAGNOSIS — D75839 Thrombocytosis, unspecified: Secondary | ICD-10-CM | POA: Diagnosis not present

## 2023-07-16 DIAGNOSIS — E538 Deficiency of other specified B group vitamins: Secondary | ICD-10-CM | POA: Insufficient documentation

## 2023-07-16 DIAGNOSIS — S92341D Displaced fracture of fourth metatarsal bone, right foot, subsequent encounter for fracture with routine healing: Secondary | ICD-10-CM | POA: Diagnosis not present

## 2023-07-16 DIAGNOSIS — N1831 Chronic kidney disease, stage 3a: Secondary | ICD-10-CM | POA: Diagnosis not present

## 2023-07-16 DIAGNOSIS — R0689 Other abnormalities of breathing: Secondary | ICD-10-CM | POA: Diagnosis not present

## 2023-07-16 DIAGNOSIS — E559 Vitamin D deficiency, unspecified: Secondary | ICD-10-CM | POA: Insufficient documentation

## 2023-07-16 DIAGNOSIS — R21 Rash and other nonspecific skin eruption: Secondary | ICD-10-CM | POA: Diagnosis not present

## 2023-07-16 DIAGNOSIS — S62109A Fracture of unspecified carpal bone, unspecified wrist, initial encounter for closed fracture: Secondary | ICD-10-CM | POA: Insufficient documentation

## 2023-07-16 DIAGNOSIS — I824Z9 Acute embolism and thrombosis of unspecified deep veins of unspecified distal lower extremity: Secondary | ICD-10-CM | POA: Insufficient documentation

## 2023-07-16 DIAGNOSIS — M109 Gout, unspecified: Secondary | ICD-10-CM | POA: Diagnosis not present

## 2023-07-16 DIAGNOSIS — S92309A Fracture of unspecified metatarsal bone(s), unspecified foot, initial encounter for closed fracture: Secondary | ICD-10-CM | POA: Insufficient documentation

## 2023-07-16 DIAGNOSIS — K5900 Constipation, unspecified: Secondary | ICD-10-CM | POA: Diagnosis not present

## 2023-07-16 DIAGNOSIS — S93402A Sprain of unspecified ligament of left ankle, initial encounter: Secondary | ICD-10-CM | POA: Diagnosis not present

## 2023-07-16 DIAGNOSIS — R262 Difficulty in walking, not elsewhere classified: Secondary | ICD-10-CM | POA: Diagnosis not present

## 2023-07-16 DIAGNOSIS — Z7401 Bed confinement status: Secondary | ICD-10-CM | POA: Diagnosis not present

## 2023-07-16 DIAGNOSIS — F419 Anxiety disorder, unspecified: Secondary | ICD-10-CM | POA: Diagnosis not present

## 2023-07-16 DIAGNOSIS — E039 Hypothyroidism, unspecified: Secondary | ICD-10-CM | POA: Diagnosis not present

## 2023-07-16 DIAGNOSIS — S93402D Sprain of unspecified ligament of left ankle, subsequent encounter: Secondary | ICD-10-CM

## 2023-07-16 DIAGNOSIS — E7800 Pure hypercholesterolemia, unspecified: Secondary | ICD-10-CM

## 2023-07-16 DIAGNOSIS — S92344A Nondisplaced fracture of fourth metatarsal bone, right foot, initial encounter for closed fracture: Secondary | ICD-10-CM | POA: Diagnosis not present

## 2023-07-16 HISTORY — DX: Unspecified fall, subsequent encounter: W19.XXXD

## 2023-07-16 HISTORY — DX: Sprain of unspecified ligament of left ankle, subsequent encounter: S93.402D

## 2023-07-16 HISTORY — DX: Displaced fracture of fifth metatarsal bone, right foot, subsequent encounter for fracture with routine healing: S92.351D

## 2023-07-16 HISTORY — DX: Bilateral primary osteoarthritis of knee: M17.0

## 2023-07-16 HISTORY — DX: Acute embolism and thrombosis of unspecified deep veins of unspecified lower extremity: I82.409

## 2023-07-16 HISTORY — DX: Pure hypercholesterolemia, unspecified: E78.00

## 2023-07-16 HISTORY — DX: Morbid (severe) obesity due to excess calories: E66.01

## 2023-07-16 HISTORY — DX: Fracture of unspecified carpal bone, unspecified wrist, initial encounter for closed fracture: S62.109A

## 2023-07-16 HISTORY — DX: Gastro-esophageal reflux disease without esophagitis: K21.9

## 2023-07-16 HISTORY — DX: Deficiency of other specified B group vitamins: E53.8

## 2023-07-16 MED ORDER — HYDROCODONE-ACETAMINOPHEN 5-325 MG PO TABS: 1.0000 | ORAL_TABLET | Freq: Four times a day (QID) | ORAL | 0 refills | Status: AC | PRN

## 2023-07-16 MED ORDER — VITAMIN D3 25 MCG PO TABS: 1000.0000 [IU] | ORAL_TABLET | Freq: Every day | ORAL | 0 refills | Status: DC

## 2023-07-16 NOTE — Discharge Summary (Signed)
Physician Discharge Summary   Patient: Kayla Chan MRN: [id number] DOB: 1931-09-18  Admit date:     07/09/2023  Discharge date: 07/16/23  Discharge Physician: Enedina Finner   PCP: Mick Sell, MD   Recommendations at discharge:    F/u Podiatry Dr Lilian Kapur in 1-2 weeks F/u PCP in 1-2 weeks   Discharge Diagnoses: Principal Problem:   Impaired ambulation Active Problems:   Personal history of venous thrombosis and embolism   DVT (deep venous thrombosis) (HCC)   Essential hypertension   Leukocytosis   Hypothyroidism   Obesity, Class III, BMI 40-49.9 (morbid obesity) (HCC)   Constipation   Chronic kidney disease, stage 3a (HCC)   Closed fracture of fourth metatarsal of right foot   Closed displaced fracture of fifth metatarsal bone of right foot   Closed nondisplaced fracture of third metatarsal bone of right foot  Kayla Chan is a 89 y.o. female with medical history significant of chronic ambulation dysfunction secondary to bilateral severe knee OA on roller walker at baseline, chronic HFrEF with LVEF 40-50%, CKD stage III, DVT on Xarelto, HTN, HLD, hypothyroidism, peptic ulcer on PPI, came from home for fall and right foot pain    DG right ankle and foot with comminuted and displaced fifth distal metatarsal shaft fracture, mildly displaced and angulated fourth metatarsal neck fracture and a nondisplaced fracture of distal third metatarsal neck. And likely ligamental strain on ankle as there was no fracture noted.    Podiatry was consulted and they were recommending cam boot and outpatient follow-up in 4 weeks.  PT and OT evaluation ordered.  Patient will remain weightbearing as tolerated    Impaired ambulation/Frequent fall. Right metatarsal fractures secondary to fall. Left ankle sprain.  --Patient with progressively worsening ambulation and history of falls.  Resulted in right mild to moderate displaced fourth and fifth metatarsal fractures with some ankle sprain.   -- Podiatry was consulted and they are recommending cam boot, weightbearing as tolerated and outpatient follow-up. --PT is recommending SNF --Pain medication adjusted   Personal history of venous thrombosis and embolism History of DVT s/p thrombectomy. -No acute concern -Continue Xarelto   Leukocytosis-improved -Likely multifactorial with recent fall.  No upper respiratory symptoms, mildly positive procalcitonin at 0.21, preliminary blood cultures negative.     Essential hypertension --on clonidine, hydralazine   Hypothyroidism --Continue Synthroid   Obesity, Class III, BMI 40-49.9 (morbid obesity) (HCC) Estimated body mass index is 43.49 kg/m as calculated from the following:      Chronic kidney disease, stage 3a (HCC) Creatinine seems to be within baseline. Avoid nephrotoxin   Chronic Constipation -Continue with as needed lactulose -Continue linzess   patient will discharge to rehab today--pt is agreeable for it.     Procedures: none Family communication : husband in the room Consults : podiatry CODE STATUS: full DVT Prophylaxis : Xarelto      Pain control - [place] Controlled Substance Reporting System database was reviewed. and patient was instructed, not to drive, operate heavy machinery, perform activities at heights, swimming or participation in water activities or provide baby-sitting services while on Pain, Sleep and Anxiety Medications; until their outpatient Physician has advised to do so again. Also recommended to not to take more than prescribed Pain, Sleep and Anxiety Medications.  Disposition: Rehabilitation facility Diet recommendation:  Discharge Diet Orders (From admission, onward)     Start     Ordered   07/16/23 0000  Diet - low sodium heart healthy  07/16/23 0923           Cardiac and Carb modified diet DISCHARGE MEDICATION: Allergies as of 07/16/2023       Reactions   Lisinopril    Intense itching per patient can not  tolerate    Amlodipine    dizziness   Atorvastatin    Other reaction(s): Muscle Pain   Bactrim [sulfamethoxazole-trimethoprim] Itching   Ciprofloxacin    dizziness   Colestid  [colestipol Hcl]    Itching, insomnia   Colestipol Other (See Comments)   insomnia   Crestor  [rosuvastatin Calcium]    Other reaction(s): Muscle Pain   Doxycycline    Fluvastatin Sodium    Other reaction(s): Muscle Pain   Furosemide    Severe Cramping   Labetalol Other (See Comments)   Dizziness   Losartan    itching   Lotensin [benazepril]    unknown   Pravastatin Sodium    Numbness in legs   Prednisone    dizziness   Rosuvastatin    Muscle pain   Verapamil    Shortness of breath, swelling, palpations.    Allopurinol Other (See Comments)   dizziness   Penicillins Rash   Took for pneumonia when she was 23 and mader her mouth break out        Medication List     STOP taking these medications    aspirin 81 MG tablet   diltiazem 120 MG tablet Commonly known as: CARDIZEM   meloxicam 15 MG tablet Commonly known as: MOBIC   sucralfate 1 g tablet Commonly known as: Carafate       TAKE these medications    cloNIDine 0.1 MG tablet Commonly known as: CATAPRES Take by mouth.   cyanocobalamin 1000 MCG tablet Commonly known as: VITAMIN B12 Take by mouth.   docusate sodium 100 MG capsule Commonly known as: COLACE Take 100 mg by mouth 2 (two) times daily.   esomeprazole 20 MG capsule Commonly known as: NexIUM Take 1 capsule (20 mg total) by mouth daily at 12 noon. What changed: how much to take   ezetimibe 10 MG tablet Commonly known as: ZETIA Take 1 tablet (10 mg total) by mouth daily.   hydrALAZINE 25 MG tablet Commonly known as: APRESOLINE Take 25 mg by mouth 3 (three) times daily.   HYDROcodone-acetaminophen 5-325 MG tablet Commonly known as: Norco Take 1 tablet by mouth every 6 (six) hours as needed for moderate pain (pain score 4-6) or severe pain (pain score  7-10). What changed:  when to take this reasons to take this   lactulose 10 GM/15ML solution Commonly known as: CHRONULAC Take by mouth 3 (three) times daily.   levothyroxine 75 MCG tablet Commonly known as: SYNTHROID TAKE 1 TABLET BY MOUTH EVERY DAY   linaclotide 72 MCG capsule Commonly known as: LINZESS Take 1 capsule (72 mcg total) by mouth daily before breakfast.   multivitamin tablet Take 1 tablet by mouth daily.   rivaroxaban 20 MG Tabs tablet Commonly known as: XARELTO Take 1 tablet (20 mg total) by mouth daily with supper.   VITAMIN C PO Take by mouth daily.   vitamin D3 25 MCG tablet Commonly known as: CHOLECALCIFEROL Take 1 tablet (1,000 Units total) by mouth daily.        Contact information for follow-up providers     Mick Sell, MD. Schedule an appointment as soon as possible for a visit in 1 week(s).   Specialty: Infectious Diseases Contact information: [address] [facility]  [address] [place] [address] [phone]         Edwin Cap, DPM. Schedule an appointment as soon as possible for a visit in 2 week(s).   Specialty: Podiatry Why: right 5th metatarsal fracture Contact information: [address] [place] [address] [phone]              Contact information for after-discharge care     Destination     [facility] Preferred SNF .   Service: Skilled Nursing Contact information: [address] [place] [address] [phone]                    Discharge Exam: Ceasar Mons Weights   07/09/23 1124  Weight: 101 kg   GENERAL:  89 y.o.-year-old patient with no acute distress.  LUNGS: Normal breath sounds bilaterally, no wheezing CARDIOVASCULAR: S1, S2 normal. No murmur   ABDOMEN: Soft, nontender, nondistended. Bowel sounds present.  EXTREMITIES: right foot cam boot+ left support boot   Condition at discharge:  fair  The results of significant diagnostics from this hospitalization (including imaging, microbiology, ancillary and laboratory) are listed below for reference.   Imaging Studies: DG Chest 1 View  Result Date: 07/10/2023 CLINICAL DATA:  89 year old female with history of congestive heart failure. EXAM: CHEST  1 VIEW COMPARISON:  Chest x-ray 07/09/2023. FINDINGS: Opacities projecting over the lower left hemithorax and left upper quadrant of the abdomen appear to be external to the patient. With this limitation in mind, lung volumes appear normal. No definite consolidative airspace disease. No pleural effusions. No pneumothorax. No evidence of pulmonary edema. Heart size is mildly enlarged. Upper mediastinal contours are within normal limits. Atherosclerotic calcifications are noted in the thoracic aorta. IMPRESSION: 1. No radiographic evidence of acute cardiopulmonary disease. 2. Mild cardiomegaly unchanged. 3. Aortic atherosclerosis. Electronically Signed   By: Trudie Reed M.D.   On: 07/10/2023 07:18   DG Chest Port 1 View  Result Date: 07/09/2023 CLINICAL DATA:  Weakness, fall today. EXAM: PORTABLE CHEST 1 VIEW COMPARISON:  Chest radiograph 01/06/2022 FINDINGS: Stable cardiomegaly. Unchanged mediastinal contours. Aortic atherosclerosis. No pulmonary edema, focal airspace disease, pleural effusion or pneumothorax. No acute osseous abnormalities are seen. IMPRESSION: Stable cardiomegaly. No acute chest findings. Electronically Signed   By: Narda Rutherford M.D.   On: 07/09/2023 16:02   DG Ankle Complete Right  Result Date: 07/09/2023 CLINICAL DATA:  Fall today with right foot pain and bruising. Ankle swelling EXAM: RIGHT ANKLE - COMPLETE 3+ VIEW; RIGHT FOOT COMPLETE - 3+ VIEW COMPARISON:  None Available. FINDINGS: Foot: Comminuted and displaced fifth distal metatarsal shaft fracture. Mildly displaced and angulated fourth metatarsal neck fracture. Suspected nondisplaced fractures of the distal  third metatarsal neck. No convincing intra-articular involvement of any of the fractures. The bones are subjectively under mineralized. Hallux valgus with advanced degenerative change of the first metatarsal phalangeal joint. Os navicular. Prominent soft tissue edema overlies the dorsum of the foot in the region of fractures. Ankle: No additional fracture of the ankle. The ankle mortise is preserved. There is a moderate plantar calcaneal spur. Prominent dorsal talar ridge. No ankle joint effusion. There is generalized subcutaneous edema. IMPRESSION: 1. Comminuted and displaced fifth distal metatarsal shaft fracture. 2. Mildly displaced and angulated fourth metatarsal neck fracture. Suspected nondisplaced fractures of the distal third metatarsal neck. 3. No additional fracture of the ankle. 4. Hallux valgus with advanced degenerative change of the first metatarsophalangeal joint. 5. Generalized  soft tissue edema of the foot and ankle, prominent over the dorsum of the foot. Electronically Signed   By: Narda Rutherford M.D.   On: 07/09/2023 15:59   DG Foot Complete Right  Result Date: 07/09/2023 CLINICAL DATA:  Fall today with right foot pain and bruising. Ankle swelling EXAM: RIGHT ANKLE - COMPLETE 3+ VIEW; RIGHT FOOT COMPLETE - 3+ VIEW COMPARISON:  None Available. FINDINGS: Foot: Comminuted and displaced fifth distal metatarsal shaft fracture. Mildly displaced and angulated fourth metatarsal neck fracture. Suspected nondisplaced fractures of the distal third metatarsal neck. No convincing intra-articular involvement of any of the fractures. The bones are subjectively under mineralized. Hallux valgus with advanced degenerative change of the first metatarsal phalangeal joint. Os navicular. Prominent soft tissue edema overlies the dorsum of the foot in the region of fractures. Ankle: No additional fracture of the ankle. The ankle mortise is preserved. There is a moderate plantar calcaneal spur. Prominent dorsal  talar ridge. No ankle joint effusion. There is generalized subcutaneous edema. IMPRESSION: 1. Comminuted and displaced fifth distal metatarsal shaft fracture. 2. Mildly displaced and angulated fourth metatarsal neck fracture. Suspected nondisplaced fractures of the distal third metatarsal neck. 3. No additional fracture of the ankle. 4. Hallux valgus with advanced degenerative change of the first metatarsophalangeal joint. 5. Generalized soft tissue edema of the foot and ankle, prominent over the dorsum of the foot. Electronically Signed   By: Narda Rutherford M.D.   On: 07/09/2023 15:59   VAS [place] LOWER EXTREMITY VENOUS (DVT)  Result Date: 06/17/2023  Lower Venous DVT Study Patient Name:  SHANIECE DRILL  Date of Exam:   06/16/2023 Medical Rec #: [id number]       Accession #:    [id number] Date of Birth: 02/22/1932       Patient Gender: F Patient Age:   56 years Exam Location:  [facility] Procedure:      VAS [place] LOWER EXTREMITY VENOUS (DVT) Referring Phys: Sheppard Plumber --------------------------------------------------------------------------------  Indications: Left leg hip and knee pain.  Performing Technologist: Salvadore Farber RVT  Examination Guidelines: A complete evaluation includes B-mode imaging, spectral Doppler, color Doppler, and power Doppler as needed of all accessible portions of each vessel. Bilateral testing is considered an integral part of a complete examination. Limited examinations for reoccurring indications may be performed as noted. The reflux portion of the exam is performed with the patient in reverse Trendelenburg.  +-----+---------------+---------+-----------+----------+--------------+ RIGHTCompressibilityPhasicitySpontaneityPropertiesThrombus Aging +-----+---------------+---------+-----------+----------+--------------+ CFV  Full           Yes                                          +-----+---------------+---------+-----------+----------+--------------+ SFJ   Full           Yes                                          +-----+---------------+---------+-----------+----------+--------------+   +---------+---------------+---------+-----------+----------+--------------+ LEFT     CompressibilityPhasicitySpontaneityPropertiesThrombus Aging +---------+---------------+---------+-----------+----------+--------------+ CFV      Full           Yes                                          +---------+---------------+---------+-----------+----------+--------------+  SFJ      Full           Yes                                          +---------+---------------+---------+-----------+----------+--------------+ FV Prox  Full           Yes                                          +---------+---------------+---------+-----------+----------+--------------+ FV Mid   Full           Yes                                          +---------+---------------+---------+-----------+----------+--------------+ FV DistalFull           Yes                                          +---------+---------------+---------+-----------+----------+--------------+ PFV      Full           Yes                                          +---------+---------------+---------+-----------+----------+--------------+ POP      Full           Yes                                          +---------+---------------+---------+-----------+----------+--------------+ PTV      Full           Yes                                          +---------+---------------+---------+-----------+----------+--------------+ PERO     Full           Yes                                          +---------+---------------+---------+-----------+----------+--------------+ Gastroc  Full           Yes                                          +---------+---------------+---------+-----------+----------+--------------+ GSV      Full           Yes                                           +---------+---------------+---------+-----------+----------+--------------+ SSV      Full                                                        +---------+---------------+---------+-----------+----------+--------------+  Summary: RIGHT: - No evidence of common femoral vein obstruction.   LEFT: - No evidence of deep vein thrombosis in the lower extremity. No indirect evidence of obstruction proximal to the inguinal ligament.   *See table(s) above for measurements and observations. Electronically signed by Festus Barren MD on 06/17/2023 at 9:32:25 AM.    Final     Microbiology: Results for orders placed or performed during the hospital encounter of 07/09/23  Blood culture (routine x 2)     Status: None   Collection Time: 07/09/23  2:06 PM   Specimen: BLOOD  Result Value Ref Range Status   Specimen Description BLOOD LEFT ANTECUBITAL  Final   Special Requests   Final    BOTTLES DRAWN AEROBIC AND ANAEROBIC Blood Culture adequate volume   Culture   Final    NO GROWTH 5 DAYS Performed at [facility], [address] [place], [place] [address]    Report Status 07/14/2023 FINAL  Final  Blood culture (routine x 2)     Status: None   Collection Time: 07/09/23  2:06 PM   Specimen: BLOOD  Result Value Ref Range Status   Specimen Description BLOOD BLOOD LEFT ARM  Final   Special Requests   Final    BOTTLES DRAWN AEROBIC AND ANAEROBIC Blood Culture adequate volume   Culture   Final    NO GROWTH 5 DAYS Performed at [facility], [address], [place], [place] [address]    Report Status 07/14/2023 FINAL  Final    Labs: CBC: Recent Labs  Lab 07/09/23 1127 07/11/23 0450 07/11/23 0928 07/12/23 0429 07/15/23 0408  WBC 12.3* 11.6* 11.9* 11.6* 9.0  NEUTROABS 10.2*  --   --   --  5.7  HGB 9.6* 7.9* 8.3* 8.3* 7.9*  HCT 31.8* 25.2* 27.4* 26.3* 25.7*  MCV 95.8 90.3 93.2 93.3 91.1  PLT 384 404* 404* 427* 513*   Basic Metabolic Panel: Recent Labs  Lab  07/09/23 1127 07/11/23 0450 07/12/23 0429 07/15/23 0408  NA 135 135 139 136  K 3.5 3.3* 3.5 3.4*  CL 104 101 105 103  CO2 20* 24 23 24   GLUCOSE 141* 124* 123* 122*  BUN 18 24* 27* 22  CREATININE 1.11* 1.18* 1.04* 0.99  CALCIUM 8.6* 8.2* 8.4* 8.0*   Liver Function Tests: Recent Labs  Lab 07/09/23 1127  AST 19  ALT 10  ALKPHOS 86  BILITOT 0.8  PROT 6.5  ALBUMIN 3.1*    Discharge time spent: greater than 30 minutes.  Signed: Enedina Finner, MD [facility] 07/16/2023

## 2023-07-16 NOTE — TOC Transition Note (Signed)
Transition of Care [facility]) - CM/SW Discharge Note   Patient Details  Name: Kayla Chan MRN: [id number] Date of Birth: 1932/02/03  Transition of [facility]) CM/SW Contact:  Margarito Liner, LCSW Phone Number: 07/16/2023, 10:39 AM   Clinical Narrative:  Patient has orders to discharge to [facility] today. RN has already called report. EMS transport has been arranged and she is 3rd on the list. No further concerns. CSW signing off.   Final next level of care: Skilled Nursing Facility Barriers to Discharge: Barriers Resolved   Patient Goals and CMS Choice      Discharge Placement PASRR number recieved: 07/12/23 PASRR number recieved: 07/12/23            Patient chooses bed at: [facility] Patient to be transferred to facility by: EMS Name of family member notified: Vetta Loven Patient and family notified of of transfer: 07/16/23  Discharge Plan and Services Additional resources added to the After Visit Summary for                                       Social Determinants of Health (SDOH) Interventions SDOH Screenings   Food Insecurity: No Food Insecurity (07/09/2023)  Housing: Low Risk  (07/09/2023)  Transportation Needs: No Transportation Needs (07/09/2023)  Utilities: Not At Risk (07/09/2023)  Alcohol Screen: Low Risk  (10/02/2020)  Depression (PHQ2-9): Low Risk  (01/08/2022)  Financial Resource Strain: Low Risk  (10/02/2020)  Physical Activity: Inactive (10/02/2020)  Social Connections: Moderately Integrated (10/02/2020)  Stress: No Stress Concern Present (10/02/2020)  Tobacco Use: Low Risk  (07/09/2023)     Readmission Risk Interventions     No data to display

## 2023-07-16 NOTE — Progress Notes (Signed)
[facility] aware of no BM

## 2023-07-16 NOTE — Plan of Care (Signed)
  Problem: Education: Goal: Knowledge of General Education information will improve Description: Including pain rating scale, medication(s)/side effects and non-pharmacologic comfort measures Outcome: Progressing   Problem: Clinical Measurements: Goal: Ability to maintain clinical measurements within normal limits will improve Outcome: Progressing   Problem: Nutrition: Goal: Adequate nutrition will be maintained Outcome: Progressing   Problem: Elimination: Goal: Will not experience complications related to bowel motility Outcome: Progressing Goal: Will not experience complications related to urinary retention Outcome: Progressing   Problem: Pain Management: Goal: General experience of comfort will improve Outcome: Progressing

## 2023-07-16 NOTE — TOC Progression Note (Signed)
Transition of Care [facility]) - Progression Note    Patient Details  Name: Kayla Chan MRN: [id number] Date of Birth: [birth year]  Transition [facility]) CM/SW Contact  Margarito Liner, LCSW Phone Number: 07/16/2023, 8:23 AM  Clinical Narrative:   Insurance authorization is approved: [id number]. Valid for 7 days. CSW left message for [facility] admissions coordinator to notify.  Expected Discharge Plan: Skilled Nursing Facility    Expected Discharge Plan and Services         Expected Discharge Date: 07/14/23                                     Social Determinants of Health (SDOH) Interventions SDOH Screenings   Food Insecurity: No Food Insecurity (07/09/2023)  Housing: Low Risk  (07/09/2023)  Transportation Needs: No Transportation Needs (07/09/2023)  Utilities: Not At Risk (07/09/2023)  Alcohol Screen: Low Risk  (10/02/2020)  Depression (PHQ2-9): Low Risk  (01/08/2022)  Financial Resource Strain: Low Risk  (10/02/2020)  Physical Activity: Inactive (10/02/2020)  Social Connections: Moderately Integrated (10/02/2020)  Stress: No Stress Concern Present (10/02/2020)  Tobacco Use: Low Risk  (07/09/2023)    Readmission Risk Interventions     No data to display

## 2023-07-19 DIAGNOSIS — K219 Gastro-esophageal reflux disease without esophagitis: Secondary | ICD-10-CM | POA: Diagnosis not present

## 2023-07-19 DIAGNOSIS — I129 Hypertensive chronic kidney disease with stage 1 through stage 4 chronic kidney disease, or unspecified chronic kidney disease: Secondary | ICD-10-CM | POA: Diagnosis not present

## 2023-07-19 DIAGNOSIS — Z86718 Personal history of other venous thrombosis and embolism: Secondary | ICD-10-CM | POA: Diagnosis not present

## 2023-07-19 DIAGNOSIS — S93402D Sprain of unspecified ligament of left ankle, subsequent encounter: Secondary | ICD-10-CM | POA: Diagnosis not present

## 2023-07-19 DIAGNOSIS — K5900 Constipation, unspecified: Secondary | ICD-10-CM | POA: Diagnosis not present

## 2023-07-19 DIAGNOSIS — D75839 Thrombocytosis, unspecified: Secondary | ICD-10-CM | POA: Diagnosis not present

## 2023-07-19 DIAGNOSIS — S92351D Displaced fracture of fifth metatarsal bone, right foot, subsequent encounter for fracture with routine healing: Secondary | ICD-10-CM | POA: Diagnosis not present

## 2023-07-19 DIAGNOSIS — E785 Hyperlipidemia, unspecified: Secondary | ICD-10-CM | POA: Diagnosis not present

## 2023-07-19 DIAGNOSIS — D72829 Elevated white blood cell count, unspecified: Secondary | ICD-10-CM | POA: Diagnosis not present

## 2023-07-19 DIAGNOSIS — N182 Chronic kidney disease, stage 2 (mild): Secondary | ICD-10-CM | POA: Diagnosis not present

## 2023-07-19 DIAGNOSIS — E039 Hypothyroidism, unspecified: Secondary | ICD-10-CM | POA: Diagnosis not present

## 2023-07-20 DIAGNOSIS — I129 Hypertensive chronic kidney disease with stage 1 through stage 4 chronic kidney disease, or unspecified chronic kidney disease: Secondary | ICD-10-CM | POA: Diagnosis not present

## 2023-07-20 DIAGNOSIS — D72829 Elevated white blood cell count, unspecified: Secondary | ICD-10-CM | POA: Diagnosis not present

## 2023-07-20 DIAGNOSIS — K5900 Constipation, unspecified: Secondary | ICD-10-CM | POA: Diagnosis not present

## 2023-07-20 DIAGNOSIS — I10 Essential (primary) hypertension: Secondary | ICD-10-CM | POA: Diagnosis not present

## 2023-07-20 DIAGNOSIS — K219 Gastro-esophageal reflux disease without esophagitis: Secondary | ICD-10-CM | POA: Diagnosis not present

## 2023-07-20 DIAGNOSIS — D75839 Thrombocytosis, unspecified: Secondary | ICD-10-CM | POA: Diagnosis not present

## 2023-07-20 DIAGNOSIS — E785 Hyperlipidemia, unspecified: Secondary | ICD-10-CM | POA: Diagnosis not present

## 2023-07-20 DIAGNOSIS — N182 Chronic kidney disease, stage 2 (mild): Secondary | ICD-10-CM | POA: Diagnosis not present

## 2023-07-20 DIAGNOSIS — E039 Hypothyroidism, unspecified: Secondary | ICD-10-CM | POA: Diagnosis not present

## 2023-07-20 DIAGNOSIS — S93402D Sprain of unspecified ligament of left ankle, subsequent encounter: Secondary | ICD-10-CM | POA: Diagnosis not present

## 2023-07-20 DIAGNOSIS — S92351D Displaced fracture of fifth metatarsal bone, right foot, subsequent encounter for fracture with routine healing: Secondary | ICD-10-CM | POA: Diagnosis not present

## 2023-07-20 DIAGNOSIS — Z86718 Personal history of other venous thrombosis and embolism: Secondary | ICD-10-CM | POA: Diagnosis not present

## 2023-07-21 ENCOUNTER — Telehealth: Payer: Self-pay

## 2023-07-21 DIAGNOSIS — K5900 Constipation, unspecified: Secondary | ICD-10-CM | POA: Diagnosis not present

## 2023-07-21 DIAGNOSIS — E039 Hypothyroidism, unspecified: Secondary | ICD-10-CM | POA: Diagnosis not present

## 2023-07-21 DIAGNOSIS — S92351D Displaced fracture of fifth metatarsal bone, right foot, subsequent encounter for fracture with routine healing: Secondary | ICD-10-CM | POA: Diagnosis not present

## 2023-07-21 DIAGNOSIS — I10 Essential (primary) hypertension: Secondary | ICD-10-CM | POA: Diagnosis not present

## 2023-07-21 DIAGNOSIS — S93402D Sprain of unspecified ligament of left ankle, subsequent encounter: Secondary | ICD-10-CM | POA: Diagnosis not present

## 2023-07-21 NOTE — Telephone Encounter (Signed)
Spoke with husband -patient fell and broke ankle-would like to r/s EGD for 09/21/23 with Dr.Wohl in [place] . Mailed new instructions.

## 2023-07-21 NOTE — Telephone Encounter (Signed)
EGD pt for Dr Servando Snare for 12/9.  She fell and broke her foot.  she is in Rehab at [facility].  Needs to reschedule "after Christmas".  Please call her husband, Kathlene November, at [phone] to reschedule.  Thanks!

## 2023-07-26 DIAGNOSIS — K5900 Constipation, unspecified: Secondary | ICD-10-CM | POA: Diagnosis not present

## 2023-07-26 DIAGNOSIS — S92351D Displaced fracture of fifth metatarsal bone, right foot, subsequent encounter for fracture with routine healing: Secondary | ICD-10-CM | POA: Diagnosis not present

## 2023-07-26 DIAGNOSIS — F419 Anxiety disorder, unspecified: Secondary | ICD-10-CM | POA: Diagnosis not present

## 2023-07-26 DIAGNOSIS — K219 Gastro-esophageal reflux disease without esophagitis: Secondary | ICD-10-CM | POA: Diagnosis not present

## 2023-07-26 DIAGNOSIS — N182 Chronic kidney disease, stage 2 (mild): Secondary | ICD-10-CM | POA: Diagnosis not present

## 2023-07-26 DIAGNOSIS — I129 Hypertensive chronic kidney disease with stage 1 through stage 4 chronic kidney disease, or unspecified chronic kidney disease: Secondary | ICD-10-CM | POA: Diagnosis not present

## 2023-07-26 DIAGNOSIS — S93402D Sprain of unspecified ligament of left ankle, subsequent encounter: Secondary | ICD-10-CM | POA: Diagnosis not present

## 2023-07-28 DIAGNOSIS — D75839 Thrombocytosis, unspecified: Secondary | ICD-10-CM | POA: Diagnosis not present

## 2023-07-28 DIAGNOSIS — F419 Anxiety disorder, unspecified: Secondary | ICD-10-CM | POA: Diagnosis not present

## 2023-07-28 DIAGNOSIS — K219 Gastro-esophageal reflux disease without esophagitis: Secondary | ICD-10-CM | POA: Diagnosis not present

## 2023-07-28 DIAGNOSIS — I10 Essential (primary) hypertension: Secondary | ICD-10-CM | POA: Diagnosis not present

## 2023-07-28 DIAGNOSIS — S92351D Displaced fracture of fifth metatarsal bone, right foot, subsequent encounter for fracture with routine healing: Secondary | ICD-10-CM | POA: Diagnosis not present

## 2023-07-30 DIAGNOSIS — S92351D Displaced fracture of fifth metatarsal bone, right foot, subsequent encounter for fracture with routine healing: Secondary | ICD-10-CM | POA: Diagnosis not present

## 2023-07-30 DIAGNOSIS — K219 Gastro-esophageal reflux disease without esophagitis: Secondary | ICD-10-CM | POA: Diagnosis not present

## 2023-07-30 DIAGNOSIS — K5900 Constipation, unspecified: Secondary | ICD-10-CM | POA: Diagnosis not present

## 2023-07-30 DIAGNOSIS — N390 Urinary tract infection, site not specified: Secondary | ICD-10-CM | POA: Insufficient documentation

## 2023-07-30 DIAGNOSIS — I10 Essential (primary) hypertension: Secondary | ICD-10-CM | POA: Diagnosis not present

## 2023-07-30 DIAGNOSIS — S93402D Sprain of unspecified ligament of left ankle, subsequent encounter: Secondary | ICD-10-CM | POA: Diagnosis not present

## 2023-07-30 HISTORY — DX: Urinary tract infection, site not specified: N39.0

## 2023-08-02 DIAGNOSIS — E785 Hyperlipidemia, unspecified: Secondary | ICD-10-CM | POA: Diagnosis not present

## 2023-08-02 DIAGNOSIS — S92351D Displaced fracture of fifth metatarsal bone, right foot, subsequent encounter for fracture with routine healing: Secondary | ICD-10-CM | POA: Diagnosis not present

## 2023-08-02 DIAGNOSIS — I129 Hypertensive chronic kidney disease with stage 1 through stage 4 chronic kidney disease, or unspecified chronic kidney disease: Secondary | ICD-10-CM | POA: Diagnosis not present

## 2023-08-02 DIAGNOSIS — E039 Hypothyroidism, unspecified: Secondary | ICD-10-CM | POA: Diagnosis not present

## 2023-08-02 DIAGNOSIS — N182 Chronic kidney disease, stage 2 (mild): Secondary | ICD-10-CM | POA: Diagnosis not present

## 2023-08-02 DIAGNOSIS — N390 Urinary tract infection, site not specified: Secondary | ICD-10-CM | POA: Diagnosis not present

## 2023-08-02 DIAGNOSIS — S93402D Sprain of unspecified ligament of left ankle, subsequent encounter: Secondary | ICD-10-CM | POA: Diagnosis not present

## 2023-08-03 ENCOUNTER — Ambulatory Visit: Payer: PPO | Admitting: Podiatry

## 2023-08-03 ENCOUNTER — Encounter: Payer: Self-pay | Admitting: Podiatry

## 2023-08-03 VITALS — Ht 60.0 in | Wt 222.7 lb

## 2023-08-03 DIAGNOSIS — S92344A Nondisplaced fracture of fourth metatarsal bone, right foot, initial encounter for closed fracture: Secondary | ICD-10-CM | POA: Diagnosis not present

## 2023-08-03 DIAGNOSIS — S93402A Sprain of unspecified ligament of left ankle, initial encounter: Secondary | ICD-10-CM

## 2023-08-03 NOTE — Progress Notes (Signed)
Subjective:  Patient ID: Kayla Chan, female    DOB: 1931/10/17,  MRN: [id number]  Chief Complaint  Patient presents with   Fracture    Pt is here to f/u on right foot fracture and left foot sprain, pt states the right foot is getting better and the left still hurts and that the boot on her left foot is really heavy.    89 y.o. female presents with the above complaint.  Patient presents with complaint of right foot fracture and left ankle sprain after she fell down about 3 weeks ago.  She went to the emergency room and had x-rays.  She had nondisplaced fracture of the third and fourth.  The fifth appears to be more chronic in nature.  She also had an ankle sprain on the left without any fractures.  She wanted to get it evaluated her pain is much improved on the right side.  She is still having some pain on the left side   Review of Systems: Negative except as noted in the HPI. Denies N/V/F/Ch.  Past Medical History:  Diagnosis Date   Cancer (HCC)    BCC on nose   Carotid artery occlusion    CHF (congestive heart failure) (HCC)    Chronic kidney disease    stage III   DVT, femoral, acute (HCC) 05/20/2023   Thrombectomy with stent placement   Gout    Hyperlipidemia    Hypertension    Hypothyroidism    Lymphedema    Wears hearing aid in both ears     Current Outpatient Medications:    Ascorbic Acid (VITAMIN C PO), Take by mouth daily., Disp: , Rfl:    cholecalciferol (CHOLECALCIFEROL) 25 MCG tablet, Take 1 tablet (1,000 Units total) by mouth daily., Disp: 30 tablet, Rfl: 0   esomeprazole (NEXIUM) 20 MG capsule, Take 1 capsule (20 mg total) by mouth daily at 12 noon. (Patient taking differently: Take 40 mg by mouth daily at 12 noon.), Disp: 90 capsule, Rfl: 3   ezetimibe (ZETIA) 10 MG tablet, Take 1 tablet (10 mg total) by mouth daily., Disp: 90 tablet, Rfl: 1   HYDROcodone-acetaminophen (NORCO) 5-325 MG tablet, Take 1 tablet by mouth every 6 (six) hours as needed for moderate  pain (pain score 4-6) or severe pain (pain score 7-10)., Disp: 6 tablet, Rfl: 0   levothyroxine (SYNTHROID) 75 MCG tablet, TAKE 1 TABLET BY MOUTH EVERY DAY, Disp: 90 tablet, Rfl: 2   linaclotide (LINZESS) 72 MCG capsule, Take 1 capsule (72 mcg total) by mouth daily before breakfast., Disp: 90 capsule, Rfl: 3   Multiple Vitamin (MULTIVITAMIN) tablet, Take 1 tablet by mouth daily., Disp: , Rfl:    rivaroxaban (XARELTO) 20 MG TABS tablet, Take 1 tablet (20 mg total) by mouth daily with supper., Disp: 30 tablet, Rfl: 6   cloNIDine (CATAPRES) 0.1 MG tablet, Take by mouth., Disp: , Rfl:   Social History   Tobacco Use  Smoking Status Never  Smokeless Tobacco Never    Allergies  Allergen Reactions   Lisinopril     Intense itching per patient can not tolerate    Amlodipine     dizziness   Atorvastatin     Other reaction(s): Muscle Pain   Bactrim [Sulfamethoxazole-Trimethoprim] Itching   Ciprofloxacin     dizziness   Colestid  [Colestipol Hcl]     Itching, insomnia   Colestipol Other (See Comments)    insomnia   Crestor  [Rosuvastatin Calcium]     Other reaction(s): Muscle  Pain   Doxycycline    Fluvastatin Sodium     Other reaction(s): Muscle Pain   Furosemide     Severe Cramping   Labetalol Other (See Comments)    Dizziness   Losartan     itching   Lotensin [Benazepril]     unknown   Pravastatin Sodium     Numbness in legs   Prednisone     dizziness   Rosuvastatin     Muscle pain   Verapamil     Shortness of breath, swelling, palpations.    Allopurinol Other (See Comments)    dizziness   Penicillins Rash    Took for pneumonia when she was 23 and mader her mouth break out   Objective:  There were no vitals filed for this visit. Body mass index is 43.49 kg/m. Constitutional Well developed. Well nourished.  Vascular Dorsalis pedis pulses palpable bilaterally. Posterior tibial pulses palpable bilaterally. Capillary refill normal to all digits.  No cyanosis or  clubbing noted. Pedal hair growth normal.  Neurologic Normal speech. Oriented to person, place, and time. Epicritic sensation to light touch grossly present bilaterally.  Dermatologic Nails well groomed and normal in appearance. No open wounds. No skin lesions.  Orthopedic: Pain on palpation to the right dorsal foot near the fourth and the third metatarsal head.  No pain along the fifth metatarsal.  Mild bruising noted.  Left pain on palpation to the ATFL ligament no pain at the posterior tibial tendon peroneal tendon Achilles tendon.   Radiographs: Comminuted and displaced fifth distal metatarsal shaft fracture. Mildly displaced and angulated fourth metatarsal neck fracture. Suspected nondisplaced fractures of the distal third metatarsal neck. No convincing intra-articular involvement of any of the fractures.  No ankle fracture on the left side noted per [facility] images that the patient brought without Assessment:   1. Closed nondisplaced fracture of fourth metatarsal bone of right foot, initial encounter   2. Moderate left ankle sprain, initial encounter    Plan:  Patient was evaluated and treated and all questions answered.  Right nondisplaced fourth and third metatarsal fracture -All questions and concerns were discussed with the patient in extensive detail given that she is doing much better in terms of pain she would benefit from transition to surgical shoe to help ambulate.  She agrees with the plan -At this time I discussed with her continue monitoring the pain if it completely resolves she can go into regular shoes the next 3 to 4 weeks.  She states understanding -Surgical shoe was dispensed  Left ankle sprain -- I explained to the patient the etiology of ankle sprain and worse treatment options were discussed. -I discussed and encouraged her to wear the boot for another week for a total of 4 weeks of cam boot immobilization she states understanding followed by she will  transition to Tri-Lock ankle brace Tri-Lock ankle brace was dispensed.  Once her pain is resolved she can go into regular shoes she states understanding will do so  No follow-ups on file.

## 2023-08-09 DIAGNOSIS — I10 Essential (primary) hypertension: Secondary | ICD-10-CM | POA: Diagnosis not present

## 2023-08-09 DIAGNOSIS — S92351D Displaced fracture of fifth metatarsal bone, right foot, subsequent encounter for fracture with routine healing: Secondary | ICD-10-CM | POA: Diagnosis not present

## 2023-08-09 DIAGNOSIS — K219 Gastro-esophageal reflux disease without esophagitis: Secondary | ICD-10-CM | POA: Diagnosis not present

## 2023-08-09 DIAGNOSIS — N390 Urinary tract infection, site not specified: Secondary | ICD-10-CM | POA: Diagnosis not present

## 2023-08-09 DIAGNOSIS — R21 Rash and other nonspecific skin eruption: Secondary | ICD-10-CM | POA: Diagnosis not present

## 2023-08-09 DIAGNOSIS — S93402D Sprain of unspecified ligament of left ankle, subsequent encounter: Secondary | ICD-10-CM | POA: Diagnosis not present

## 2023-08-16 DIAGNOSIS — S92351D Displaced fracture of fifth metatarsal bone, right foot, subsequent encounter for fracture with routine healing: Secondary | ICD-10-CM | POA: Diagnosis not present

## 2023-08-16 DIAGNOSIS — Z9181 History of falling: Secondary | ICD-10-CM | POA: Diagnosis not present

## 2023-08-16 DIAGNOSIS — Z86718 Personal history of other venous thrombosis and embolism: Secondary | ICD-10-CM | POA: Diagnosis not present

## 2023-08-16 DIAGNOSIS — Z8711 Personal history of peptic ulcer disease: Secondary | ICD-10-CM | POA: Diagnosis not present

## 2023-08-16 DIAGNOSIS — E785 Hyperlipidemia, unspecified: Secondary | ICD-10-CM | POA: Diagnosis not present

## 2023-08-16 DIAGNOSIS — S92334D Nondisplaced fracture of third metatarsal bone, right foot, subsequent encounter for fracture with routine healing: Secondary | ICD-10-CM | POA: Diagnosis not present

## 2023-08-16 DIAGNOSIS — K219 Gastro-esophageal reflux disease without esophagitis: Secondary | ICD-10-CM | POA: Diagnosis not present

## 2023-08-16 DIAGNOSIS — Z87440 Personal history of urinary (tract) infections: Secondary | ICD-10-CM | POA: Diagnosis not present

## 2023-08-16 DIAGNOSIS — Z96642 Presence of left artificial hip joint: Secondary | ICD-10-CM | POA: Diagnosis not present

## 2023-08-16 DIAGNOSIS — S92341D Displaced fracture of fourth metatarsal bone, right foot, subsequent encounter for fracture with routine healing: Secondary | ICD-10-CM | POA: Diagnosis not present

## 2023-08-16 DIAGNOSIS — I130 Hypertensive heart and chronic kidney disease with heart failure and stage 1 through stage 4 chronic kidney disease, or unspecified chronic kidney disease: Secondary | ICD-10-CM | POA: Diagnosis not present

## 2023-08-16 DIAGNOSIS — E039 Hypothyroidism, unspecified: Secondary | ICD-10-CM | POA: Diagnosis not present

## 2023-08-16 DIAGNOSIS — E559 Vitamin D deficiency, unspecified: Secondary | ICD-10-CM | POA: Diagnosis not present

## 2023-08-16 DIAGNOSIS — M6281 Muscle weakness (generalized): Secondary | ICD-10-CM | POA: Diagnosis not present

## 2023-08-16 DIAGNOSIS — Z96653 Presence of artificial knee joint, bilateral: Secondary | ICD-10-CM | POA: Diagnosis not present

## 2023-08-16 DIAGNOSIS — M109 Gout, unspecified: Secondary | ICD-10-CM | POA: Diagnosis not present

## 2023-08-16 DIAGNOSIS — N183 Chronic kidney disease, stage 3 unspecified: Secondary | ICD-10-CM | POA: Diagnosis not present

## 2023-08-16 DIAGNOSIS — E669 Obesity, unspecified: Secondary | ICD-10-CM | POA: Diagnosis not present

## 2023-08-16 DIAGNOSIS — Z6841 Body Mass Index (BMI) 40.0 and over, adult: Secondary | ICD-10-CM | POA: Diagnosis not present

## 2023-08-16 DIAGNOSIS — I5022 Chronic systolic (congestive) heart failure: Secondary | ICD-10-CM | POA: Diagnosis not present

## 2023-08-16 DIAGNOSIS — M170 Bilateral primary osteoarthritis of knee: Secondary | ICD-10-CM | POA: Diagnosis not present

## 2023-08-16 DIAGNOSIS — Z7901 Long term (current) use of anticoagulants: Secondary | ICD-10-CM | POA: Diagnosis not present

## 2023-08-27 DIAGNOSIS — I5022 Chronic systolic (congestive) heart failure: Secondary | ICD-10-CM | POA: Diagnosis not present

## 2023-08-27 DIAGNOSIS — S92334D Nondisplaced fracture of third metatarsal bone, right foot, subsequent encounter for fracture with routine healing: Secondary | ICD-10-CM | POA: Diagnosis not present

## 2023-08-27 DIAGNOSIS — I130 Hypertensive heart and chronic kidney disease with heart failure and stage 1 through stage 4 chronic kidney disease, or unspecified chronic kidney disease: Secondary | ICD-10-CM | POA: Diagnosis not present

## 2023-08-27 DIAGNOSIS — S92351D Displaced fracture of fifth metatarsal bone, right foot, subsequent encounter for fracture with routine healing: Secondary | ICD-10-CM | POA: Diagnosis not present

## 2023-08-27 DIAGNOSIS — S92341D Displaced fracture of fourth metatarsal bone, right foot, subsequent encounter for fracture with routine healing: Secondary | ICD-10-CM | POA: Diagnosis not present

## 2023-08-27 DIAGNOSIS — N183 Chronic kidney disease, stage 3 unspecified: Secondary | ICD-10-CM | POA: Diagnosis not present

## 2023-08-27 DIAGNOSIS — E785 Hyperlipidemia, unspecified: Secondary | ICD-10-CM | POA: Diagnosis not present

## 2023-09-12 DIAGNOSIS — I82409 Acute embolism and thrombosis of unspecified deep veins of unspecified lower extremity: Secondary | ICD-10-CM | POA: Diagnosis not present

## 2023-09-12 DIAGNOSIS — S93402D Sprain of unspecified ligament of left ankle, subsequent encounter: Secondary | ICD-10-CM | POA: Diagnosis not present

## 2023-09-12 DIAGNOSIS — M109 Gout, unspecified: Secondary | ICD-10-CM | POA: Diagnosis not present

## 2023-09-12 DIAGNOSIS — S92334D Nondisplaced fracture of third metatarsal bone, right foot, subsequent encounter for fracture with routine healing: Secondary | ICD-10-CM | POA: Diagnosis not present

## 2023-09-12 DIAGNOSIS — S92351D Displaced fracture of fifth metatarsal bone, right foot, subsequent encounter for fracture with routine healing: Secondary | ICD-10-CM | POA: Diagnosis not present

## 2023-09-12 DIAGNOSIS — M170 Bilateral primary osteoarthritis of knee: Secondary | ICD-10-CM | POA: Diagnosis not present

## 2023-09-12 DIAGNOSIS — S92341D Displaced fracture of fourth metatarsal bone, right foot, subsequent encounter for fracture with routine healing: Secondary | ICD-10-CM | POA: Diagnosis not present

## 2023-09-15 ENCOUNTER — Telehealth: Payer: Self-pay

## 2023-09-15 NOTE — Telephone Encounter (Signed)
Received message from [facility] endoscopy unit stating the patient cancelled EGD  09/21/23 with DR.Wohl due to illness- spoke with patient;s husband and they want to see her PCP and then they will call back to reschedule the EGD.

## 2023-09-21 ENCOUNTER — Ambulatory Visit: Admission: RE | Admit: 2023-09-21 | Payer: PPO | Source: Home / Self Care | Admitting: Gastroenterology

## 2023-09-21 SURGERY — ESOPHAGOGASTRODUODENOSCOPY (EGD) WITH PROPOFOL
Anesthesia: General

## 2023-09-24 DIAGNOSIS — R300 Dysuria: Secondary | ICD-10-CM | POA: Diagnosis not present

## 2023-09-24 DIAGNOSIS — I10 Essential (primary) hypertension: Secondary | ICD-10-CM | POA: Diagnosis not present

## 2023-09-24 DIAGNOSIS — E039 Hypothyroidism, unspecified: Secondary | ICD-10-CM | POA: Diagnosis not present

## 2023-09-24 DIAGNOSIS — N1832 Chronic kidney disease, stage 3b: Secondary | ICD-10-CM | POA: Diagnosis not present

## 2023-09-24 DIAGNOSIS — I5032 Chronic diastolic (congestive) heart failure: Secondary | ICD-10-CM | POA: Diagnosis not present

## 2023-10-13 DIAGNOSIS — S93402D Sprain of unspecified ligament of left ankle, subsequent encounter: Secondary | ICD-10-CM | POA: Diagnosis not present

## 2023-10-13 DIAGNOSIS — M170 Bilateral primary osteoarthritis of knee: Secondary | ICD-10-CM | POA: Diagnosis not present

## 2023-10-13 DIAGNOSIS — S92351D Displaced fracture of fifth metatarsal bone, right foot, subsequent encounter for fracture with routine healing: Secondary | ICD-10-CM | POA: Diagnosis not present

## 2023-10-13 DIAGNOSIS — I82409 Acute embolism and thrombosis of unspecified deep veins of unspecified lower extremity: Secondary | ICD-10-CM | POA: Diagnosis not present

## 2023-10-13 DIAGNOSIS — S92341D Displaced fracture of fourth metatarsal bone, right foot, subsequent encounter for fracture with routine healing: Secondary | ICD-10-CM | POA: Diagnosis not present

## 2023-10-13 DIAGNOSIS — M109 Gout, unspecified: Secondary | ICD-10-CM | POA: Diagnosis not present

## 2023-10-13 DIAGNOSIS — S92334D Nondisplaced fracture of third metatarsal bone, right foot, subsequent encounter for fracture with routine healing: Secondary | ICD-10-CM | POA: Diagnosis not present

## 2023-10-25 ENCOUNTER — Other Ambulatory Visit: Payer: Self-pay

## 2023-10-26 NOTE — Progress Notes (Deleted)
 Celso Amy, PA-C [address]  [address]  [place], [place] [address]  Main: [phone]  Fax: [phone]   Primary Care Physician: Mick Sell, MD  Primary Gastroenterologist:  Celso Amy, PA-C / Dr. Midge Minium    CC: F/U IDA, Constipation, GERD, Hiatal Hernia.  HPI: Kayla Chan is a 89 y.o. female returns for follow-up of Iron Deficiency Anemia, upper abdominal pain, GERD, hiatal hernia, and chronic constipation.  Remote history of esophagitis and duodenal ulcer.  Patient was scheduled to have an EGD with Dr. Servando Snare 09/21/2023 to evaluate worsening anemia.  However she had to cancel the procedure due to illness.  Medical history significant of chronic ambulation dysfunction secondary to bilateral severe knee OA on roller walker at baseline, chronic HFrEF with LVEF 40-50%, CKD stage III, DVT on Xarelto, HTN, HLD, hypothyroidism, peptic ulcer on PPI.  09/24/2023 labs: Hemoglobin 11.6, MCV 84.7, ferritin 83. 07/11/2023 Labs: Hemoglobin 7.9, MCV 90.3.  She tried Nexium 40 mg (caused nausea), Carafate (caused dizziness) and Linzess daily (helped).  He is here today with her husband who helps with her care.  Patient does not like taking medication and is very sensitive to medicines.   She could not tolerate Dulcolax, MiraLAX, or senna in the past.   Abdominal pelvic CT without contrast 03/03/2023: Mild to moderate sigmoid diverticulosis, chronic ventral abdominal hernia repair with mesh, stable since 2014.  Retained stool in the colon consistent with constipation.  No acute abnormality.   Last EGD by Dr. Servando Snare 07/2013 showed LA grade C esophagitis without bleeding lower third of the esophagus.  Medium hiatal hernia.  12 mm duodenal ulcer, nonbleeding.   Last screening colonoscopy 07/2013 showed moderate sigmoid diverticulosis, small internal hemorrhoids, otherwise normal.  No repeat due to advanced age.  Current Outpatient Medications  Medication Sig Dispense  Refill   Ascorbic Acid (VITAMIN C PO) Take by mouth daily.     cholecalciferol (CHOLECALCIFEROL) 25 MCG tablet Take 1 tablet (1,000 Units total) by mouth daily. 30 tablet 0   cloNIDine (CATAPRES) 0.1 MG tablet Take by mouth.     diltiazem (CARDIZEM CD) 120 MG 24 hr capsule      esomeprazole (NEXIUM) 20 MG capsule Take 1 capsule (20 mg total) by mouth daily at 12 noon. (Patient taking differently: Take 40 mg by mouth daily at 12 noon.) 90 capsule 3   ezetimibe (ZETIA) 10 MG tablet Take 1 tablet (10 mg total) by mouth daily. 90 tablet 1   HYDROcodone-acetaminophen (NORCO) 5-325 MG tablet Take 1 tablet by mouth every 6 (six) hours as needed for moderate pain (pain score 4-6) or severe pain (pain score 7-10). 6 tablet 0   levothyroxine (SYNTHROID) 75 MCG tablet TAKE 1 TABLET BY MOUTH EVERY DAY 90 tablet 2   linaclotide (LINZESS) 72 MCG capsule Take 1 capsule (72 mcg total) by mouth daily before breakfast. 90 capsule 3   Multiple Vitamin (MULTIVITAMIN) tablet Take 1 tablet by mouth daily.     rivaroxaban (XARELTO) 20 MG TABS tablet Take 1 tablet (20 mg total) by mouth daily with supper. 30 tablet 6   No current facility-administered medications for this visit.    Allergies as of 10/27/2023 - Review Complete 08/03/2023  Allergen Reaction Noted   Lisinopril  01/10/2020   Amlodipine  03/07/2018   Atorvastatin  06/28/2015   Bactrim [sulfamethoxazole-trimethoprim] Itching 12/18/2020   Ciprofloxacin  07/28/2022   Colestid  [colestipol hcl]  06/28/2015   Colestipol Other (See Comments) 01/29/2021  Crestor  [rosuvastatin calcium]  06/28/2015   Doxycycline  06/28/2015   Fluvastatin sodium  06/28/2015   Furosemide  06/28/2015   Labetalol Other (See Comments) 01/29/2021   Losartan  05/21/2020   Lotensin [benazepril]  07/28/2022   Pravastatin sodium  06/28/2015   Prednisone  07/28/2022   Rosuvastatin  07/28/2022   Verapamil  06/10/2017   Allopurinol Other (See Comments) 09/21/2018   Penicillins  Rash 06/28/2015    Past Medical History:  Diagnosis Date   Acute embolism and thrombosis of unspecified deep veins of unspecified lower extremity (HCC) 07/16/2023   Bilateral primary osteoarthritis of knee 07/16/2023   Cancer (HCC)    BCC on nose   Carotid artery occlusion    CHF (congestive heart failure) (HCC)    Chronic kidney disease    stage III   Closed fracture of carpal bone 07/16/2023   Deficiency of other specified B group vitamins 07/16/2023   Displaced fracture of fifth metatarsal bone, right foot, subsequent encounter for fracture with routine healing 07/16/2023   DVT, femoral, acute (HCC) 05/20/2023   Thrombectomy with stent placement   Gastro-esophageal reflux disease without esophagitis 07/16/2023   Gout    Hyperlipidemia    Hypertension    Hypothyroidism    Lymphedema    Morbid (severe) obesity due to excess calories (HCC) 07/16/2023   Pure hypercholesterolemia, unspecified 07/16/2023   Sprain of unspecified ligament of left ankle, subsequent encounter 07/16/2023   Unspecified fall, subsequent encounter 07/16/2023   Urinary tract infection, site not specified 07/30/2023   Vitamin D deficiency, unspecified 07/16/2023   Wears hearing aid in both ears     Past Surgical History:  Procedure Laterality Date   24 hour Holter Monitor  01/22/2013   4550 ectopic beats with 344 superventricular bigemminy events   ABDOMINAL HYSTERECTOMY     Abdominal Ultrasound  05/31/2013   RUQ. Fatty Liver   APPENDECTOMY  08/24/1948   [place]   CATARACT EXTRACTION W/PHACO Left 07/31/2022   Procedure: CATARACT EXTRACTION PHACO AND INTRAOCULAR LENS PLACEMENT (IOC) LEFT;  Surgeon: Galen Manila, MD;  Location[facility];  Service: Ophthalmology;  Laterality: Left;  11.46 1:14.3   HARDWARE REMOVAL Right 04/21/2012   knee   HERNIA REPAIR     PERIPHERAL VASCULAR THROMBECTOMY Left 05/20/2023   Procedure: PERIPHERAL VASCULAR THROMBECTOMY;  Surgeon: Annice Needy, MD;   Location: [facility]IVE [facility];  Service: Cardiovascular;  Laterality: Left;   REPLACEMENT TOTAL KNEE Right 09/10/2011   Inpatient, [employer] [facility], [place]; Right   TOTAL HIP ARTHROPLASTY Left 10/12/2008   TOTAL KNEE ARTHROPLASTY Left 05/20/2006    Review of Systems:    All systems reviewed and negative except where noted in HPI.   Physical Examination:   There were no vitals taken for this visit.  General: Well-nourished, well-developed in no acute distress.  Lungs: Clear to auscultation bilaterally. Non-labored. Heart: Regular rate and rhythm, no murmurs rubs or gallops.  Abdomen: Bowel sounds are normal; Abdomen is Soft; No hepatosplenomegaly, masses or hernias;  No Abdominal Tenderness; No guarding or rebound tenderness. Neuro: Alert and oriented x 3.  Grossly intact.  Psych: Alert and cooperative, normal mood and affect.   Imaging Studies: No results found.  Assessment and Plan:   Tashawnda Bleiler is a 89 y.o. y/o female returns for follow-up of:  1.  Chronic anemia multifactorial; iron deficiency and anemia of chronic disease  Labs: CBC, iron panel, ferritin, B12, folate  2.  History of peptic ulcer.  GERD.  Hiatal hernia.  Continue Nexium 40 mg daily.  Continue long-term PPI.  3.  Chronic constipation.  Continue Linzess.  4.  Multiple comorbidities: Age 63, chronic anticoagulation with Xarelto.  Consider high risk for any endoscopy procedures.  Celso Amy, PA-C  Follow up ***  BP check ***

## 2023-10-27 ENCOUNTER — Ambulatory Visit: Payer: PPO | Admitting: Physician Assistant

## 2023-11-03 ENCOUNTER — Other Ambulatory Visit: Payer: Self-pay | Admitting: Physician Assistant

## 2023-11-03 DIAGNOSIS — Z8719 Personal history of other diseases of the digestive system: Secondary | ICD-10-CM

## 2023-11-10 DIAGNOSIS — M109 Gout, unspecified: Secondary | ICD-10-CM | POA: Diagnosis not present

## 2023-11-10 DIAGNOSIS — S93402D Sprain of unspecified ligament of left ankle, subsequent encounter: Secondary | ICD-10-CM | POA: Diagnosis not present

## 2023-11-10 DIAGNOSIS — S92334D Nondisplaced fracture of third metatarsal bone, right foot, subsequent encounter for fracture with routine healing: Secondary | ICD-10-CM | POA: Diagnosis not present

## 2023-11-10 DIAGNOSIS — M170 Bilateral primary osteoarthritis of knee: Secondary | ICD-10-CM | POA: Diagnosis not present

## 2023-11-10 DIAGNOSIS — I82409 Acute embolism and thrombosis of unspecified deep veins of unspecified lower extremity: Secondary | ICD-10-CM | POA: Diagnosis not present

## 2023-11-10 DIAGNOSIS — S92341D Displaced fracture of fourth metatarsal bone, right foot, subsequent encounter for fracture with routine healing: Secondary | ICD-10-CM | POA: Diagnosis not present

## 2023-11-10 DIAGNOSIS — S92351D Displaced fracture of fifth metatarsal bone, right foot, subsequent encounter for fracture with routine healing: Secondary | ICD-10-CM | POA: Diagnosis not present

## 2023-11-15 ENCOUNTER — Other Ambulatory Visit (INDEPENDENT_AMBULATORY_CARE_PROVIDER_SITE_OTHER): Payer: Self-pay | Admitting: Nurse Practitioner

## 2023-11-15 DIAGNOSIS — I82412 Acute embolism and thrombosis of left femoral vein: Secondary | ICD-10-CM

## 2023-11-16 ENCOUNTER — Ambulatory Visit (INDEPENDENT_AMBULATORY_CARE_PROVIDER_SITE_OTHER): Payer: PPO | Admitting: Vascular Surgery

## 2023-11-16 ENCOUNTER — Encounter (INDEPENDENT_AMBULATORY_CARE_PROVIDER_SITE_OTHER): Payer: PPO

## 2023-12-03 ENCOUNTER — Telehealth (INDEPENDENT_AMBULATORY_CARE_PROVIDER_SITE_OTHER): Payer: Self-pay | Admitting: Nurse Practitioner

## 2023-12-03 NOTE — Telephone Encounter (Signed)
 Patient spouse was notified with medical recommendations and verbalized understanding

## 2023-12-03 NOTE — Telephone Encounter (Signed)
 Pt had procedure done on 05/20/23 by Dr. Wyn Quaker. Pt spouse called and is wanting to know how long pt needs to stay on blood thinner. Pt spouse states they are making her dizzy. Please advise. Call spouse back at [phone]

## 2023-12-03 NOTE — Telephone Encounter (Signed)
 She needs to stay on blood thinners for 6 months from the date of her procedure.  Based on that she can stop them now.  I would recommend she take a baby aspirin.  If she remains dizzy after stopping the blood thinners she should follow up with PCP

## 2023-12-08 DIAGNOSIS — I10 Essential (primary) hypertension: Secondary | ICD-10-CM | POA: Diagnosis not present

## 2023-12-08 DIAGNOSIS — E66813 Obesity, class 3: Secondary | ICD-10-CM | POA: Diagnosis not present

## 2023-12-08 DIAGNOSIS — E039 Hypothyroidism, unspecified: Secondary | ICD-10-CM | POA: Diagnosis not present

## 2023-12-08 DIAGNOSIS — Z0000 Encounter for general adult medical examination without abnormal findings: Secondary | ICD-10-CM | POA: Diagnosis not present

## 2023-12-08 DIAGNOSIS — R300 Dysuria: Secondary | ICD-10-CM | POA: Diagnosis not present

## 2023-12-08 DIAGNOSIS — I5032 Chronic diastolic (congestive) heart failure: Secondary | ICD-10-CM | POA: Diagnosis not present

## 2023-12-11 DIAGNOSIS — S92341D Displaced fracture of fourth metatarsal bone, right foot, subsequent encounter for fracture with routine healing: Secondary | ICD-10-CM | POA: Diagnosis not present

## 2023-12-11 DIAGNOSIS — I82409 Acute embolism and thrombosis of unspecified deep veins of unspecified lower extremity: Secondary | ICD-10-CM | POA: Diagnosis not present

## 2023-12-11 DIAGNOSIS — S92351D Displaced fracture of fifth metatarsal bone, right foot, subsequent encounter for fracture with routine healing: Secondary | ICD-10-CM | POA: Diagnosis not present

## 2023-12-11 DIAGNOSIS — M109 Gout, unspecified: Secondary | ICD-10-CM | POA: Diagnosis not present

## 2023-12-11 DIAGNOSIS — S92334D Nondisplaced fracture of third metatarsal bone, right foot, subsequent encounter for fracture with routine healing: Secondary | ICD-10-CM | POA: Diagnosis not present

## 2023-12-11 DIAGNOSIS — S93402D Sprain of unspecified ligament of left ankle, subsequent encounter: Secondary | ICD-10-CM | POA: Diagnosis not present

## 2023-12-11 DIAGNOSIS — M170 Bilateral primary osteoarthritis of knee: Secondary | ICD-10-CM | POA: Diagnosis not present

## 2024-01-07 DIAGNOSIS — E039 Hypothyroidism, unspecified: Secondary | ICD-10-CM | POA: Diagnosis not present

## 2024-01-07 DIAGNOSIS — I6523 Occlusion and stenosis of bilateral carotid arteries: Secondary | ICD-10-CM | POA: Diagnosis not present

## 2024-01-07 DIAGNOSIS — I10 Essential (primary) hypertension: Secondary | ICD-10-CM | POA: Diagnosis not present

## 2024-01-07 DIAGNOSIS — R42 Dizziness and giddiness: Secondary | ICD-10-CM | POA: Diagnosis not present

## 2024-01-07 DIAGNOSIS — E66813 Obesity, class 3: Secondary | ICD-10-CM | POA: Diagnosis not present

## 2024-01-07 DIAGNOSIS — E785 Hyperlipidemia, unspecified: Secondary | ICD-10-CM | POA: Diagnosis not present

## 2024-01-07 DIAGNOSIS — N183 Chronic kidney disease, stage 3 unspecified: Secondary | ICD-10-CM | POA: Diagnosis not present

## 2024-01-10 DIAGNOSIS — M109 Gout, unspecified: Secondary | ICD-10-CM | POA: Diagnosis not present

## 2024-01-10 DIAGNOSIS — S92341D Displaced fracture of fourth metatarsal bone, right foot, subsequent encounter for fracture with routine healing: Secondary | ICD-10-CM | POA: Diagnosis not present

## 2024-01-10 DIAGNOSIS — S92351D Displaced fracture of fifth metatarsal bone, right foot, subsequent encounter for fracture with routine healing: Secondary | ICD-10-CM | POA: Diagnosis not present

## 2024-01-10 DIAGNOSIS — M170 Bilateral primary osteoarthritis of knee: Secondary | ICD-10-CM | POA: Diagnosis not present

## 2024-01-10 DIAGNOSIS — I82409 Acute embolism and thrombosis of unspecified deep veins of unspecified lower extremity: Secondary | ICD-10-CM | POA: Diagnosis not present

## 2024-01-10 DIAGNOSIS — S92334D Nondisplaced fracture of third metatarsal bone, right foot, subsequent encounter for fracture with routine healing: Secondary | ICD-10-CM | POA: Diagnosis not present

## 2024-01-10 DIAGNOSIS — S93402D Sprain of unspecified ligament of left ankle, subsequent encounter: Secondary | ICD-10-CM | POA: Diagnosis not present

## 2024-02-10 DIAGNOSIS — S92351D Displaced fracture of fifth metatarsal bone, right foot, subsequent encounter for fracture with routine healing: Secondary | ICD-10-CM | POA: Diagnosis not present

## 2024-02-10 DIAGNOSIS — S93402D Sprain of unspecified ligament of left ankle, subsequent encounter: Secondary | ICD-10-CM | POA: Diagnosis not present

## 2024-02-10 DIAGNOSIS — S92341D Displaced fracture of fourth metatarsal bone, right foot, subsequent encounter for fracture with routine healing: Secondary | ICD-10-CM | POA: Diagnosis not present

## 2024-02-10 DIAGNOSIS — M109 Gout, unspecified: Secondary | ICD-10-CM | POA: Diagnosis not present

## 2024-02-10 DIAGNOSIS — I82409 Acute embolism and thrombosis of unspecified deep veins of unspecified lower extremity: Secondary | ICD-10-CM | POA: Diagnosis not present

## 2024-02-10 DIAGNOSIS — S92334D Nondisplaced fracture of third metatarsal bone, right foot, subsequent encounter for fracture with routine healing: Secondary | ICD-10-CM | POA: Diagnosis not present

## 2024-02-10 DIAGNOSIS — M170 Bilateral primary osteoarthritis of knee: Secondary | ICD-10-CM | POA: Diagnosis not present

## 2024-02-17 DIAGNOSIS — L814 Other melanin hyperpigmentation: Secondary | ICD-10-CM | POA: Diagnosis not present

## 2024-02-17 DIAGNOSIS — L309 Dermatitis, unspecified: Secondary | ICD-10-CM | POA: Diagnosis not present

## 2024-02-17 DIAGNOSIS — L821 Other seborrheic keratosis: Secondary | ICD-10-CM | POA: Diagnosis not present

## 2024-02-17 DIAGNOSIS — Z85828 Personal history of other malignant neoplasm of skin: Secondary | ICD-10-CM | POA: Diagnosis not present

## 2024-02-17 DIAGNOSIS — L918 Other hypertrophic disorders of the skin: Secondary | ICD-10-CM | POA: Diagnosis not present

## 2024-02-17 DIAGNOSIS — L570 Actinic keratosis: Secondary | ICD-10-CM | POA: Diagnosis not present

## 2024-03-07 DIAGNOSIS — I10 Essential (primary) hypertension: Secondary | ICD-10-CM | POA: Diagnosis not present

## 2024-03-07 DIAGNOSIS — R9431 Abnormal electrocardiogram [ECG] [EKG]: Secondary | ICD-10-CM | POA: Diagnosis not present

## 2024-03-07 DIAGNOSIS — R42 Dizziness and giddiness: Secondary | ICD-10-CM | POA: Diagnosis not present

## 2024-03-07 DIAGNOSIS — E785 Hyperlipidemia, unspecified: Secondary | ICD-10-CM | POA: Diagnosis not present

## 2024-03-07 DIAGNOSIS — R600 Localized edema: Secondary | ICD-10-CM | POA: Diagnosis not present

## 2024-03-07 DIAGNOSIS — R1084 Generalized abdominal pain: Secondary | ICD-10-CM | POA: Diagnosis not present

## 2024-03-07 DIAGNOSIS — I6523 Occlusion and stenosis of bilateral carotid arteries: Secondary | ICD-10-CM | POA: Diagnosis not present

## 2024-03-07 DIAGNOSIS — E66813 Obesity, class 3: Secondary | ICD-10-CM | POA: Diagnosis not present

## 2024-03-07 DIAGNOSIS — N183 Chronic kidney disease, stage 3 unspecified: Secondary | ICD-10-CM | POA: Diagnosis not present

## 2024-03-11 DIAGNOSIS — M170 Bilateral primary osteoarthritis of knee: Secondary | ICD-10-CM | POA: Diagnosis not present

## 2024-03-11 DIAGNOSIS — S92351D Displaced fracture of fifth metatarsal bone, right foot, subsequent encounter for fracture with routine healing: Secondary | ICD-10-CM | POA: Diagnosis not present

## 2024-03-11 DIAGNOSIS — I82409 Acute embolism and thrombosis of unspecified deep veins of unspecified lower extremity: Secondary | ICD-10-CM | POA: Diagnosis not present

## 2024-03-11 DIAGNOSIS — S92341D Displaced fracture of fourth metatarsal bone, right foot, subsequent encounter for fracture with routine healing: Secondary | ICD-10-CM | POA: Diagnosis not present

## 2024-03-11 DIAGNOSIS — S93402D Sprain of unspecified ligament of left ankle, subsequent encounter: Secondary | ICD-10-CM | POA: Diagnosis not present

## 2024-03-11 DIAGNOSIS — M109 Gout, unspecified: Secondary | ICD-10-CM | POA: Diagnosis not present

## 2024-03-11 DIAGNOSIS — S92334D Nondisplaced fracture of third metatarsal bone, right foot, subsequent encounter for fracture with routine healing: Secondary | ICD-10-CM | POA: Diagnosis not present

## 2024-03-24 DIAGNOSIS — I10 Essential (primary) hypertension: Secondary | ICD-10-CM | POA: Diagnosis not present

## 2024-03-24 DIAGNOSIS — R600 Localized edema: Secondary | ICD-10-CM | POA: Diagnosis not present

## 2024-03-24 DIAGNOSIS — E66813 Obesity, class 3: Secondary | ICD-10-CM | POA: Diagnosis not present

## 2024-03-24 DIAGNOSIS — R42 Dizziness and giddiness: Secondary | ICD-10-CM | POA: Diagnosis not present

## 2024-03-24 DIAGNOSIS — R0602 Shortness of breath: Secondary | ICD-10-CM | POA: Diagnosis not present

## 2024-03-24 DIAGNOSIS — N183 Chronic kidney disease, stage 3 unspecified: Secondary | ICD-10-CM | POA: Diagnosis not present

## 2024-03-24 DIAGNOSIS — I6523 Occlusion and stenosis of bilateral carotid arteries: Secondary | ICD-10-CM | POA: Diagnosis not present

## 2024-03-24 DIAGNOSIS — E785 Hyperlipidemia, unspecified: Secondary | ICD-10-CM | POA: Diagnosis not present

## 2024-04-10 DIAGNOSIS — R300 Dysuria: Secondary | ICD-10-CM | POA: Insufficient documentation

## 2024-04-10 DIAGNOSIS — I491 Atrial premature depolarization: Secondary | ICD-10-CM | POA: Diagnosis not present

## 2024-04-10 DIAGNOSIS — I10 Essential (primary) hypertension: Secondary | ICD-10-CM | POA: Diagnosis not present

## 2024-04-11 ENCOUNTER — Other Ambulatory Visit: Payer: Self-pay | Admitting: Internal Medicine

## 2024-04-11 ENCOUNTER — Other Ambulatory Visit (INDEPENDENT_AMBULATORY_CARE_PROVIDER_SITE_OTHER): Payer: Self-pay | Admitting: Internal Medicine

## 2024-04-11 DIAGNOSIS — S92351D Displaced fracture of fifth metatarsal bone, right foot, subsequent encounter for fracture with routine healing: Secondary | ICD-10-CM | POA: Diagnosis not present

## 2024-04-11 DIAGNOSIS — S93402D Sprain of unspecified ligament of left ankle, subsequent encounter: Secondary | ICD-10-CM | POA: Diagnosis not present

## 2024-04-11 DIAGNOSIS — S92334D Nondisplaced fracture of third metatarsal bone, right foot, subsequent encounter for fracture with routine healing: Secondary | ICD-10-CM | POA: Diagnosis not present

## 2024-04-11 DIAGNOSIS — I10 Essential (primary) hypertension: Secondary | ICD-10-CM

## 2024-04-11 DIAGNOSIS — I82409 Acute embolism and thrombosis of unspecified deep veins of unspecified lower extremity: Secondary | ICD-10-CM | POA: Diagnosis not present

## 2024-04-11 DIAGNOSIS — M170 Bilateral primary osteoarthritis of knee: Secondary | ICD-10-CM | POA: Diagnosis not present

## 2024-04-11 DIAGNOSIS — S92341D Displaced fracture of fourth metatarsal bone, right foot, subsequent encounter for fracture with routine healing: Secondary | ICD-10-CM | POA: Diagnosis not present

## 2024-04-11 DIAGNOSIS — M109 Gout, unspecified: Secondary | ICD-10-CM | POA: Diagnosis not present

## 2024-04-17 DIAGNOSIS — I6523 Occlusion and stenosis of bilateral carotid arteries: Secondary | ICD-10-CM | POA: Diagnosis not present

## 2024-04-17 DIAGNOSIS — E782 Mixed hyperlipidemia: Secondary | ICD-10-CM | POA: Diagnosis not present

## 2024-04-17 DIAGNOSIS — I10 Essential (primary) hypertension: Secondary | ICD-10-CM | POA: Diagnosis not present

## 2024-04-17 DIAGNOSIS — R600 Localized edema: Secondary | ICD-10-CM | POA: Diagnosis not present

## 2024-04-17 DIAGNOSIS — I491 Atrial premature depolarization: Secondary | ICD-10-CM | POA: Diagnosis not present

## 2024-04-19 ENCOUNTER — Ambulatory Visit (INDEPENDENT_AMBULATORY_CARE_PROVIDER_SITE_OTHER)

## 2024-04-19 DIAGNOSIS — I10 Essential (primary) hypertension: Secondary | ICD-10-CM | POA: Diagnosis not present

## 2024-04-25 DIAGNOSIS — R300 Dysuria: Secondary | ICD-10-CM | POA: Diagnosis not present

## 2024-04-25 DIAGNOSIS — I10 Essential (primary) hypertension: Secondary | ICD-10-CM | POA: Diagnosis not present

## 2024-04-25 DIAGNOSIS — N183 Chronic kidney disease, stage 3 unspecified: Secondary | ICD-10-CM | POA: Diagnosis not present

## 2024-04-25 DIAGNOSIS — M542 Cervicalgia: Secondary | ICD-10-CM | POA: Diagnosis not present

## 2024-04-25 DIAGNOSIS — M9901 Segmental and somatic dysfunction of cervical region: Secondary | ICD-10-CM | POA: Diagnosis not present

## 2024-04-25 DIAGNOSIS — E039 Hypothyroidism, unspecified: Secondary | ICD-10-CM | POA: Diagnosis not present

## 2024-05-12 DIAGNOSIS — M109 Gout, unspecified: Secondary | ICD-10-CM | POA: Diagnosis not present

## 2024-05-12 DIAGNOSIS — M170 Bilateral primary osteoarthritis of knee: Secondary | ICD-10-CM | POA: Diagnosis not present

## 2024-05-12 DIAGNOSIS — S92351D Displaced fracture of fifth metatarsal bone, right foot, subsequent encounter for fracture with routine healing: Secondary | ICD-10-CM | POA: Diagnosis not present

## 2024-05-12 DIAGNOSIS — S92341D Displaced fracture of fourth metatarsal bone, right foot, subsequent encounter for fracture with routine healing: Secondary | ICD-10-CM | POA: Diagnosis not present

## 2024-05-12 DIAGNOSIS — S93402D Sprain of unspecified ligament of left ankle, subsequent encounter: Secondary | ICD-10-CM | POA: Diagnosis not present

## 2024-05-12 DIAGNOSIS — I82409 Acute embolism and thrombosis of unspecified deep veins of unspecified lower extremity: Secondary | ICD-10-CM | POA: Diagnosis not present

## 2024-05-12 DIAGNOSIS — S92334D Nondisplaced fracture of third metatarsal bone, right foot, subsequent encounter for fracture with routine healing: Secondary | ICD-10-CM | POA: Diagnosis not present

## 2024-05-15 DIAGNOSIS — S0502XA Injury of conjunctiva and corneal abrasion without foreign body, left eye, initial encounter: Secondary | ICD-10-CM | POA: Diagnosis not present

## 2024-05-22 DIAGNOSIS — S0502XD Injury of conjunctiva and corneal abrasion without foreign body, left eye, subsequent encounter: Secondary | ICD-10-CM | POA: Diagnosis not present

## 2024-05-30 DIAGNOSIS — R42 Dizziness and giddiness: Secondary | ICD-10-CM | POA: Diagnosis not present

## 2024-05-30 DIAGNOSIS — Z23 Encounter for immunization: Secondary | ICD-10-CM | POA: Diagnosis not present

## 2024-05-30 DIAGNOSIS — N183 Chronic kidney disease, stage 3 unspecified: Secondary | ICD-10-CM | POA: Diagnosis not present

## 2024-05-30 DIAGNOSIS — Z1331 Encounter for screening for depression: Secondary | ICD-10-CM | POA: Diagnosis not present

## 2024-05-30 DIAGNOSIS — I10 Essential (primary) hypertension: Secondary | ICD-10-CM | POA: Diagnosis not present

## 2024-05-30 DIAGNOSIS — I651 Occlusion and stenosis of basilar artery: Secondary | ICD-10-CM | POA: Insufficient documentation

## 2024-06-11 DIAGNOSIS — S93402D Sprain of unspecified ligament of left ankle, subsequent encounter: Secondary | ICD-10-CM | POA: Diagnosis not present

## 2024-06-11 DIAGNOSIS — S92351D Displaced fracture of fifth metatarsal bone, right foot, subsequent encounter for fracture with routine healing: Secondary | ICD-10-CM | POA: Diagnosis not present

## 2024-06-11 DIAGNOSIS — M170 Bilateral primary osteoarthritis of knee: Secondary | ICD-10-CM | POA: Diagnosis not present

## 2024-06-11 DIAGNOSIS — S92334D Nondisplaced fracture of third metatarsal bone, right foot, subsequent encounter for fracture with routine healing: Secondary | ICD-10-CM | POA: Diagnosis not present

## 2024-06-11 DIAGNOSIS — S92341D Displaced fracture of fourth metatarsal bone, right foot, subsequent encounter for fracture with routine healing: Secondary | ICD-10-CM | POA: Diagnosis not present

## 2024-06-11 DIAGNOSIS — I82409 Acute embolism and thrombosis of unspecified deep veins of unspecified lower extremity: Secondary | ICD-10-CM | POA: Diagnosis not present

## 2024-06-11 DIAGNOSIS — M109 Gout, unspecified: Secondary | ICD-10-CM | POA: Diagnosis not present

## 2024-06-20 DIAGNOSIS — R42 Dizziness and giddiness: Secondary | ICD-10-CM | POA: Diagnosis not present

## 2024-06-20 DIAGNOSIS — I10 Essential (primary) hypertension: Secondary | ICD-10-CM | POA: Diagnosis not present

## 2024-06-20 DIAGNOSIS — K5900 Constipation, unspecified: Secondary | ICD-10-CM | POA: Diagnosis not present

## 2024-06-20 DIAGNOSIS — R32 Unspecified urinary incontinence: Secondary | ICD-10-CM | POA: Diagnosis not present

## 2024-07-07 DIAGNOSIS — R309 Painful micturition, unspecified: Secondary | ICD-10-CM | POA: Diagnosis not present

## 2024-07-12 DIAGNOSIS — I82409 Acute embolism and thrombosis of unspecified deep veins of unspecified lower extremity: Secondary | ICD-10-CM | POA: Diagnosis not present

## 2024-07-12 DIAGNOSIS — S92351D Displaced fracture of fifth metatarsal bone, right foot, subsequent encounter for fracture with routine healing: Secondary | ICD-10-CM | POA: Diagnosis not present

## 2024-07-12 DIAGNOSIS — M109 Gout, unspecified: Secondary | ICD-10-CM | POA: Diagnosis not present

## 2024-07-12 DIAGNOSIS — M170 Bilateral primary osteoarthritis of knee: Secondary | ICD-10-CM | POA: Diagnosis not present

## 2024-07-12 DIAGNOSIS — S92334D Nondisplaced fracture of third metatarsal bone, right foot, subsequent encounter for fracture with routine healing: Secondary | ICD-10-CM | POA: Diagnosis not present

## 2024-07-12 DIAGNOSIS — S93402D Sprain of unspecified ligament of left ankle, subsequent encounter: Secondary | ICD-10-CM | POA: Diagnosis not present

## 2024-07-12 DIAGNOSIS — S92341D Displaced fracture of fourth metatarsal bone, right foot, subsequent encounter for fracture with routine healing: Secondary | ICD-10-CM | POA: Diagnosis not present

## 2024-07-22 ENCOUNTER — Other Ambulatory Visit: Payer: Self-pay | Admitting: Physician Assistant

## 2024-07-22 DIAGNOSIS — K5904 Chronic idiopathic constipation: Secondary | ICD-10-CM

## 2024-07-24 DIAGNOSIS — R208 Other disturbances of skin sensation: Secondary | ICD-10-CM | POA: Diagnosis not present

## 2024-07-24 DIAGNOSIS — D485 Neoplasm of uncertain behavior of skin: Secondary | ICD-10-CM | POA: Diagnosis not present

## 2024-07-24 DIAGNOSIS — L820 Inflamed seborrheic keratosis: Secondary | ICD-10-CM | POA: Diagnosis not present

## 2024-07-24 DIAGNOSIS — L821 Other seborrheic keratosis: Secondary | ICD-10-CM | POA: Diagnosis not present

## 2024-07-24 DIAGNOSIS — L570 Actinic keratosis: Secondary | ICD-10-CM | POA: Diagnosis not present

## 2024-08-22 ENCOUNTER — Other Ambulatory Visit: Payer: Self-pay | Admitting: Family Medicine

## 2024-08-22 ENCOUNTER — Telehealth: Payer: Self-pay

## 2024-08-22 DIAGNOSIS — K5904 Chronic idiopathic constipation: Secondary | ICD-10-CM

## 2024-08-22 MED ORDER — LINACLOTIDE 72 MCG PO CAPS: 72.0000 ug | ORAL_CAPSULE | Freq: Every day | ORAL | 0 refills | Status: AC

## 2024-08-22 NOTE — Telephone Encounter (Signed)
 Patient called to schedule an appointment for medication refill. Appointment was scheduled with NP Grayce Bohr on 10/03/24 at 1:30 PM. Patients preferred pharmacy is [facility], located at [address] [place], [place] [address]. Pharmacy phone number: [phone].

## 2024-09-18 ENCOUNTER — Ambulatory Visit: Admitting: Urology

## 2024-09-28 ENCOUNTER — Other Ambulatory Visit: Payer: Self-pay

## 2024-10-03 ENCOUNTER — Ambulatory Visit: Admitting: Family Medicine

## 2024-11-13 ENCOUNTER — Ambulatory Visit: Admitting: Urology
